# Patient Record
Sex: Male | Born: 1960 | Race: White | Hispanic: No | Marital: Married | Smoking: Former smoker
Health system: Southern US, Community
[De-identification: ages and names within clinical notes are randomized; demographics above are authoritative.]

## PROBLEM LIST (undated history)

## (undated) DIAGNOSIS — Z95810 Presence of automatic (implantable) cardiac defibrillator: Secondary | ICD-10-CM

## (undated) DIAGNOSIS — E785 Hyperlipidemia, unspecified: Secondary | ICD-10-CM

## (undated) DIAGNOSIS — I10 Essential (primary) hypertension: Secondary | ICD-10-CM

## (undated) DIAGNOSIS — E119 Type 2 diabetes mellitus without complications: Secondary | ICD-10-CM

## (undated) DIAGNOSIS — M1990 Unspecified osteoarthritis, unspecified site: Secondary | ICD-10-CM

## (undated) HISTORY — DX: Hyperlipidemia, unspecified: E78.5

## (undated) HISTORY — PX: TONSILLECTOMY: SUR1361

## (undated) HISTORY — DX: Essential (primary) hypertension: I10

## (undated) HISTORY — DX: Type 2 diabetes mellitus without complications: E11.9

## (undated) HISTORY — DX: Unspecified osteoarthritis, unspecified site: M19.90

---

## 2002-07-07 ENCOUNTER — Encounter: Admission: RE | Admit: 2002-07-07 | Discharge: 2002-10-05 | Payer: Self-pay | Admitting: Emergency Medicine

## 2017-09-12 ENCOUNTER — Other Ambulatory Visit: Payer: Self-pay

## 2017-09-12 ENCOUNTER — Ambulatory Visit: Payer: Managed Care, Other (non HMO) | Admitting: Family Medicine

## 2017-09-12 ENCOUNTER — Encounter: Payer: Self-pay | Admitting: Family Medicine

## 2017-09-12 VITALS — BP 112/78 | HR 108 | Temp 98.4°F | Ht 70.0 in | Wt 172.4 lb

## 2017-09-12 DIAGNOSIS — E7800 Pure hypercholesterolemia, unspecified: Secondary | ICD-10-CM | POA: Diagnosis not present

## 2017-09-12 DIAGNOSIS — Z794 Long term (current) use of insulin: Secondary | ICD-10-CM | POA: Diagnosis not present

## 2017-09-12 DIAGNOSIS — E1139 Type 2 diabetes mellitus with other diabetic ophthalmic complication: Secondary | ICD-10-CM

## 2017-09-12 DIAGNOSIS — Z0000 Encounter for general adult medical examination without abnormal findings: Secondary | ICD-10-CM | POA: Insufficient documentation

## 2017-09-12 DIAGNOSIS — Z1159 Encounter for screening for other viral diseases: Secondary | ICD-10-CM | POA: Insufficient documentation

## 2017-09-12 DIAGNOSIS — Z0001 Encounter for general adult medical examination with abnormal findings: Secondary | ICD-10-CM | POA: Diagnosis not present

## 2017-09-12 DIAGNOSIS — Z125 Encounter for screening for malignant neoplasm of prostate: Secondary | ICD-10-CM | POA: Insufficient documentation

## 2017-09-12 DIAGNOSIS — Z1211 Encounter for screening for malignant neoplasm of colon: Secondary | ICD-10-CM | POA: Insufficient documentation

## 2017-09-12 DIAGNOSIS — I10 Essential (primary) hypertension: Secondary | ICD-10-CM

## 2017-09-12 DIAGNOSIS — E119 Type 2 diabetes mellitus without complications: Secondary | ICD-10-CM | POA: Insufficient documentation

## 2017-09-12 NOTE — Progress Notes (Signed)
Subjective:  Patient ID: John [name], male    DOB: 24-Jan-1960  Age: 57 y.o. MRN: [id number]  CC: Establish Care (est care, needs a primary due to new insurance. 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  Blood pressure has been controlled.  Recently diagnosed noticed with eye problems associated with diabetes in the last 2 weeks he tells me.  He has been seeing the endocrinologist for his diabetes in the past but has not seen her in some time now.  He takes 55 units of glargine in the evening.  He gives himself 6 units of regular insulin before each meal.  He does enjoy 3 or so alcoholic beverages on a daily basis.  Recently switched from hard cider to Michelob ultra light.  Tells me that his fasting sugars have been in the 170+ range.  He has not been adjusting his Lantus.  He is taking simvastatin for his cholesterol.  He works third shift.  He lives with his wife.  He quit smoking some years ago.  He is up perhaps twice during his sleeping hours to urinate.  He has 2 sisters who have been recently diagnosed with colon polyps.  History Jontay has no past medical history on file.   He has no past surgical history on file.   His family history is not on file.He reports that he has quit smoking. He has never used smokeless tobacco. His alcohol and drug histories are not on file.  Outpatient Medications Prior to Visit  Medication Sig Dispense Refill  . FARXIGA 10 MG TABS tablet     . Insulin Glargine (BASAGLAR KWIKPEN) 100 UNIT/ML SOPN     . losartan-hydrochlorothiazide (HYZAAR) 100-25 MG tablet     . metFORMIN (GLUCOPHAGE-XR) 500 MG 24 hr tablet     . simvastatin (ZOCOR) 40 MG tablet      No facility-administered medications prior to visit.     ROS Review of Systems  Constitutional: Negative for chills, diaphoresis, fatigue, fever and  unexpected weight change.  HENT: Negative.   Eyes: Negative for photophobia and visual disturbance.  Respiratory: Negative.   Cardiovascular: Negative.   Gastrointestinal: Negative.   Endocrine: Negative for polyphagia and polyuria.  Genitourinary: Negative for decreased urine volume, difficulty urinating and urgency.  Skin: Negative for pallor and rash.  Allergic/Immunologic: Negative for immunocompromised state.  Neurological: Negative for weakness and headaches.  Hematological: Does not bruise/bleed easily.  Psychiatric/Behavioral: Negative.     Objective:  BP 112/78 (BP Location: Left Arm, Patient Position: Sitting, Cuff Size: Normal)   Pulse (!) 108   Temp 98.4 F (36.9 C) (Oral)   Ht 5\' 10"  (1.778 m)   Wt 172 lb 6.4 oz (78.2 kg)   SpO2 97%   BMI 24.74 kg/m   Physical Exam  Constitutional: He is oriented to person, place, and time. He appears well-developed and well-nourished. No distress.  HENT:  Head: Normocephalic and atraumatic.  Right Ear: External ear normal.  Left Ear: External ear normal.  Mouth/Throat: Oropharynx is clear and moist. No oropharyngeal exudate.  Eyes: Pupils are equal, round, and reactive to light. Conjunctivae and EOM are normal. Right eye exhibits no discharge. Left eye exhibits no discharge. No scleral icterus.  Neck: Neck supple. No JVD present. No tracheal deviation present. No thyromegaly present.  Cardiovascular: Normal rate, regular rhythm and normal heart sounds.  Pulmonary/Chest: Effort normal and breath sounds normal.  Abdominal: Soft. Bowel sounds are normal. He exhibits no distension and no mass. There is no tenderness. There is no guarding.  Genitourinary: Rectal exam shows no external hemorrhoid, no internal hemorrhoid, no fissure, no mass, no tenderness, anal tone normal and guaiac negative stool. Prostate is not enlarged and not tender.  Lymphadenopathy:    He has no cervical adenopathy.  Neurological: He is alert and oriented to  person, place, and time.  Skin: Skin is warm and dry. He is not diaphoretic.  Psychiatric: He has a normal mood and affect. His behavior is normal.      Assessment & Plan:   John [name] was seen today for establish care.  Diagnoses and all orders for this visit:  Essential hypertension -     CBC; Future -     Comprehensive metabolic panel; Future -     TSH; Future -     Urinalysis, Routine w reflex microscopic; Future -     Microalbumin / creatinine urine ratio; Future  Elevated LDL cholesterol level -     Lipid panel; Future  Type 2 diabetes mellitus with other ophthalmic complication, with long-term current use of insulin (HCC) -     CBC; Future -     Comprehensive metabolic panel; Future -     Hemoglobin A1c; Future -     TSH; Future -     Urinalysis, Routine w reflex microscopic; Future -     Microalbumin / creatinine urine ratio; Future  Encounter for health maintenance examination with abnormal findings -     PSA; Future -     Ambulatory referral to Gastroenterology   I am having Hinda Lenis. Gordy Savers [name] maintain his Cruzita Lederer [facility], losartan-hydrochlorothiazide, metFORMIN, and simvastatin.  No orders of the defined types were placed in this encounter.  As patient to cut back on his ultralights to no more than 2 a day.  He will increase his Basaglar by 4 units nightly to achieve fasting blood sugars in the less than 140 range.  May be referred back to Dr. Soyla Murphy pending the results of his ordered hemoglobin A1c.  He did tell me that Zocor drives him crazy but he has been taking simvastatin without issue.  Asked him to follow-up with me in a month.  He was given anticipatory guidance on disease prevention and health promotion.  Colonoscopy ordered.  Follow-up: Return in about 1 month (around 10/12/2017).  Libby Maw, MD

## 2017-09-12 NOTE — Patient Instructions (Signed)
Diabetes Mellitus and Nutrition When you have diabetes (diabetes mellitus), it is very important to have healthy eating habits because your blood sugar (glucose) levels are greatly affected by what you eat and drink. Eating healthy foods in the appropriate amounts, at about the same times every day, can help you:  Control your blood glucose.  Lower your risk of heart disease.  Improve your blood pressure.  Reach or maintain a healthy weight.  Every person with diabetes is different, and each person has different needs for a meal plan. Your health care provider may recommend that you work with a diet and nutrition specialist (dietitian) to make a meal plan that is best for you. Your meal plan may vary depending on factors such as:  The calories you need.  The medicines you take.  Your weight.  Your blood glucose, blood pressure, and cholesterol levels.  Your activity level.  Other health conditions you have, such as heart or kidney disease.  How do carbohydrates affect me? Carbohydrates affect your blood glucose level more than any other type of food. Eating carbohydrates naturally increases the amount of glucose in your blood. Carbohydrate counting is a method for keeping track of how many carbohydrates you eat. Counting carbohydrates is important to keep your blood glucose at a healthy level, especially if you use insulin or take certain oral diabetes medicines. It is important to know how many carbohydrates you can safely have in each meal. This is different for every person. Your dietitian can help you calculate how many carbohydrates you should have at each meal and for snack. Foods that contain carbohydrates include:  Bread, cereal, rice, pasta, and crackers.  Potatoes and corn.  Peas, beans, and lentils.  Milk and yogurt.  Fruit and juice.  Desserts, such as cakes, cookies, ice cream, and candy.  How does alcohol affect me? Alcohol can cause a sudden decrease in blood  glucose (hypoglycemia), especially if you use insulin or take certain oral diabetes medicines. Hypoglycemia can be a life-threatening condition. Symptoms of hypoglycemia (sleepiness, dizziness, and confusion) are similar to symptoms of having too much alcohol. If your health care provider says that alcohol is safe for you, follow these guidelines:  Limit alcohol intake to no more than 1 drink per day for nonpregnant women and 2 drinks per day for men. One drink equals 12 oz of beer, 5 oz of wine, or 1 oz of hard liquor.  Do not drink on an empty stomach.  Keep yourself hydrated with water, diet soda, or unsweetened iced tea.  Keep in mind that regular soda, juice, and other mixers may contain a lot of sugar and must be counted as carbohydrates.  What are tips for following this plan? Reading food labels  Start by checking the serving size on the label. The amount of calories, carbohydrates, fats, and other nutrients listed on the label are based on one serving of the food. Many foods contain more than one serving per package.  Check the total grams (g) of carbohydrates in one serving. You can calculate the number of servings of carbohydrates in one serving by dividing the total carbohydrates by 15. For example, if a food has 30 g of total carbohydrates, it would be equal to 2 servings of carbohydrates.  Check the number of grams (g) of saturated and trans fats in one serving. Choose foods that have low or no amount of these fats.  Check the number of milligrams (mg) of sodium in one serving. Most people   should limit total sodium intake to less than 2,300 mg per day.  Always check the nutrition information of foods labeled as "low-fat" or "nonfat". These foods may be higher in added sugar or refined carbohydrates and should be avoided.  Talk to your dietitian to identify your daily goals for nutrients listed on the label. Shopping  Avoid buying canned, premade, or processed foods. These  foods tend to be high in fat, sodium, and added sugar.  Shop around the outside edge of the grocery store. This includes fresh fruits and vegetables, bulk grains, fresh meats, and fresh dairy. Cooking  Use low-heat cooking methods, such as baking, instead of high-heat cooking methods like deep frying.  Cook using healthy oils, such as olive, canola, or sunflower oil.  Avoid cooking with butter, cream, or high-fat meats. Meal planning  Eat meals and snacks regularly, preferably at the same times every day. Avoid going long periods of time without eating.  Eat foods high in fiber, such as fresh fruits, vegetables, beans, and whole grains. Talk to your dietitian about how many servings of carbohydrates you can eat at each meal.  Eat 4-6 ounces of lean protein each day, such as lean meat, chicken, fish, eggs, or tofu. 1 ounce is equal to 1 ounce of meat, chicken, or fish, 1 egg, or 1/4 cup of tofu.  Eat some foods each day that contain healthy fats, such as avocado, nuts, seeds, and fish. Lifestyle   Check your blood glucose regularly.  Exercise at least 30 minutes 5 or more days each week, or as told by your health care provider.  Take medicines as told by your health care provider.  Do not use any products that contain nicotine or tobacco, such as cigarettes and e-cigarettes. If you need help quitting, ask your health care provider.  Work with a Social worker or diabetes educator to identify strategies to manage stress and any emotional and social challenges. What are some questions to ask my health care provider?  Do I need to meet with a diabetes educator?  Do I need to meet with a dietitian?  What number can I call if I have questions?  When are the best times to check my blood glucose? Where to find more information:  American Diabetes Association: diabetes.org/food-and-fitness/food  Academy of Nutrition and Dietetics:  PokerClues.dk  [employer] of Diabetes and Digestive and Kidney Diseases (NIH): ContactWire.be Summary  A healthy meal plan will help you control your blood glucose and maintain a healthy lifestyle.  Working with a diet and nutrition specialist (dietitian) can help you make a meal plan that is best for you.  Keep in mind that carbohydrates and alcohol have immediate effects on your blood glucose levels. It is important to count carbohydrates and to use alcohol carefully. This information is not intended to replace advice given to you by your health care provider. Make sure you discuss any questions you have with your health care provider. Document Released: 09/14/2004 Document Revised: 01/23/2016 Document Reviewed: 01/23/2016 Elsevier Interactive Patient Education  2018 [place] Maintenance, Male A healthy lifestyle and preventive care is important for your health and wellness. Ask your health care provider about what schedule of regular examinations is right for you. What should I know about weight and diet? Eat a Healthy Diet  Eat plenty of vegetables, fruits, whole grains, low-fat dairy products, and lean protein.  Do not eat a lot of foods high in solid fats, added sugars, or salt.  Maintain a  Healthy Weight Regular exercise can help you achieve or maintain a healthy weight. You should:  Do at least 150 minutes of exercise each week. The exercise should increase your heart rate and make you sweat (moderate-intensity exercise).  Do strength-training exercises at least twice a week.  Watch Your Levels of Cholesterol and Blood Lipids  Have your blood tested for lipids and cholesterol every 5 years starting at 56 years of age. If you are at high risk for heart disease, you should start having your blood tested when you are 56 years old. You  may need to have your cholesterol levels checked more often if: ? Your lipid or cholesterol levels are high. ? You are older than 56 years of age. ? You are at high risk for heart disease.  What should I know about cancer screening? Many types of cancers can be detected early and may often be prevented. Lung Cancer  You should be screened every year for lung cancer if: ? You are a current smoker who has smoked for at least 30 years. ? You are a former smoker who has quit within the past 15 years.  Talk to your health care provider about your screening options, when you should start screening, and how often you should be screened.  Colorectal Cancer  Routine colorectal cancer screening usually begins at 56 years of age and should be repeated every 5-10 years until you are 56 years old. You may need to be screened more often if early forms of precancerous polyps or small growths are found. Your health care provider may recommend screening at an earlier age if you have risk factors for colon cancer.  Your health care provider may recommend using home test kits to check for hidden blood in the stool.  A small camera at the end of a tube can be used to examine your colon (sigmoidoscopy or colonoscopy). This checks for the earliest forms of colorectal cancer.  Prostate and Testicular Cancer  Depending on your age and overall health, your health care provider may do certain tests to screen for prostate and testicular cancer.  Talk to your health care provider about any symptoms or concerns you have about testicular or prostate cancer.  Skin Cancer  Check your skin from head to toe regularly.  Tell your health care provider about any new moles or changes in moles, especially if: ? There is a change in a mole's size, shape, or color. ? You have a mole that is larger than a pencil eraser.  Always use sunscreen. Apply sunscreen liberally and repeat throughout the day.  Protect yourself by  wearing long sleeves, pants, a wide-brimmed hat, and sunglasses when outside.  What should I know about heart disease, diabetes, and high blood pressure?  If you are 59-23 years of age, have your blood pressure checked every 3-5 years. If you are 61 years of age or older, have your blood pressure checked every year. You should have your blood pressure measured twice-once when you are at a hospital or clinic, and once when you are not at a hospital or clinic. Record the average of the two measurements. To check your blood pressure when you are not at a hospital or clinic, you can use: ? An automated blood pressure machine at a pharmacy. ? A home blood pressure monitor.  Talk to your health care provider about your target blood pressure.  If you are between 57-36 years old, ask your health care provider if you should  take aspirin to prevent heart disease.  Have regular diabetes screenings by checking your fasting blood sugar level. ? If you are at a normal weight and have a low risk for diabetes, have this test once every three years after the age of 45. ? If you are overweight and have a high risk for diabetes, consider being tested at a younger age or more often.  A one-time screening for abdominal aortic aneurysm (AAA) by ultrasound is recommended for men aged 77-75 years who are current or former smokers. What should I know about preventing infection? Hepatitis B If you have a higher risk for hepatitis B, you should be screened for this virus. Talk with your health care provider to find out if you are at risk for hepatitis B infection. Hepatitis C Blood testing is recommended for:  Everyone born from [birth year] through 1964.  Anyone with known risk factors for hepatitis C.  Sexually Transmitted Diseases (STDs)  You should be screened each year for STDs including gonorrhea and chlamydia if: ? You are sexually active and are younger than 56 years of age. ? You are older than 56 years of age  and your health care provider tells you that you are at risk for this type of infection. ? Your sexual activity has changed since you were last screened and you are at an increased risk for chlamydia or gonorrhea. Ask your health care provider if you are at risk.  Talk with your health care provider about whether you are at high risk of being infected with HIV. Your health care provider may recommend a prescription medicine to help prevent HIV infection.  What else can I do?  Schedule regular health, dental, and eye exams.  Stay current with your vaccines (immunizations).  Do not use any tobacco products, such as cigarettes, chewing tobacco, and e-cigarettes. If you need help quitting, ask your health care provider.  Limit alcohol intake to no more than 2 drinks per day. One drink equals 12 ounces of beer, 5 ounces of wine, or 1 ounces of hard liquor.  Do not use street drugs.  Do not share needles.  Ask your health care provider for help if you need support or information about quitting drugs.  Tell your health care provider if you often feel depressed.  Tell your health care provider if you have ever been abused or do not feel safe at home. This information is not intended to replace advice given to you by your health care provider. Make sure you discuss any questions you have with your health care provider. Document Released: 06/16/2007 Document Revised: 08/17/2015 Document Reviewed: 09/21/2014 Elsevier Interactive Patient Education  [employer].

## 2017-10-02 ENCOUNTER — Encounter: Payer: Self-pay | Admitting: Internal Medicine

## 2017-10-08 ENCOUNTER — Other Ambulatory Visit (INDEPENDENT_AMBULATORY_CARE_PROVIDER_SITE_OTHER): Payer: Managed Care, Other (non HMO)

## 2017-10-08 DIAGNOSIS — Z0001 Encounter for general adult medical examination with abnormal findings: Secondary | ICD-10-CM

## 2017-10-08 DIAGNOSIS — E7800 Pure hypercholesterolemia, unspecified: Secondary | ICD-10-CM | POA: Diagnosis not present

## 2017-10-08 DIAGNOSIS — I10 Essential (primary) hypertension: Secondary | ICD-10-CM

## 2017-10-08 DIAGNOSIS — E1139 Type 2 diabetes mellitus with other diabetic ophthalmic complication: Secondary | ICD-10-CM | POA: Diagnosis not present

## 2017-10-08 DIAGNOSIS — Z794 Long term (current) use of insulin: Secondary | ICD-10-CM | POA: Diagnosis not present

## 2017-10-08 LAB — COMPREHENSIVE METABOLIC PANEL
ALT: 21 U/L (ref 0–53)
AST: 18 U/L (ref 0–37)
Albumin: 4.6 g/dL (ref 3.5–5.2)
Alkaline Phosphatase: 55 U/L (ref 39–117)
BUN: 27 mg/dL — ABNORMAL HIGH (ref 6–23)
CO2: 29 mEq/L (ref 19–32)
Calcium: 9.8 mg/dL (ref 8.4–10.5)
Chloride: 101 mEq/L (ref 96–112)
Creatinine, Ser: 0.9 mg/dL (ref 0.40–1.50)
GFR: 92.65 mL/min (ref >60.00)
Glucose, Bld: 93 mg/dL (ref 70–99)
Potassium: 4.2 mEq/L (ref 3.5–5.1)
Sodium: 139 mEq/L (ref 135–145)
Total Bilirubin: 0.5 mg/dL (ref 0.2–1.2)
Total Protein: 6.9 g/dL (ref 6.0–8.3)

## 2017-10-08 LAB — CBC
HCT: 46.1 % (ref 39.0–52.0)
Hemoglobin: 16 g/dL (ref 13.0–17.0)
MCHC: 34.7 g/dL (ref 30.0–36.0)
MCV: 93.9 fl (ref 78.0–100.0)
Platelets: 352 10*3/uL (ref 150.0–400.0)
RBC: 4.91 Mil/uL (ref 4.22–5.81)
RDW: 12.6 % (ref 11.5–15.5)
WBC: 10.6 10*3/uL — ABNORMAL HIGH (ref 4.0–10.5)

## 2017-10-08 LAB — URINALYSIS, ROUTINE W REFLEX MICROSCOPIC
Bilirubin Urine: NEGATIVE
Hgb urine dipstick: NEGATIVE
Ketones, ur: NEGATIVE
Leukocytes, UA: NEGATIVE
Nitrite: NEGATIVE
RBC / HPF: NONE SEEN (ref 0–?)
Specific Gravity, Urine: 1.025 (ref 1.000–1.030)
Total Protein, Urine: NEGATIVE
Urine Glucose: >=1000 — AB
Urobilinogen, UA: 0.2 (ref 0.0–1.0)
WBC, UA: NONE SEEN (ref 0–?)
pH: 5.5 (ref 5.0–8.0)

## 2017-10-08 LAB — LIPID PANEL
Cholesterol: 168 mg/dL (ref 0–200)
HDL: 51.8 mg/dL (ref >39.00)
LDL Cholesterol: 101 mg/dL — ABNORMAL HIGH (ref 0–99)
NonHDL: 115.96
Total CHOL/HDL Ratio: 3
Triglycerides: 75 mg/dL (ref 0.0–149.0)
VLDL: 15 mg/dL (ref 0.0–40.0)

## 2017-10-08 LAB — MICROALBUMIN / CREATININE URINE RATIO
Creatinine,U: 116.8 mg/dL
Microalb Creat Ratio: 0.6 mg/g (ref 0.0–30.0)
Microalb, Ur: <0.7 mg/dL (ref 0.0–1.9)

## 2017-10-08 LAB — TSH: TSH: 1.54 u[IU]/mL (ref 0.35–4.50)

## 2017-10-08 LAB — HEMOGLOBIN A1C: Hgb A1c MFr Bld: 7.2 % — ABNORMAL HIGH (ref 4.6–6.5)

## 2017-10-08 LAB — PSA: PSA: 0.65 ng/mL (ref 0.10–4.00)

## 2017-10-10 ENCOUNTER — Ambulatory Visit: Payer: Managed Care, Other (non HMO) | Admitting: Family Medicine

## 2017-10-10 ENCOUNTER — Encounter: Payer: Self-pay | Admitting: Family Medicine

## 2017-10-10 VITALS — BP 110/60 | HR 85 | Ht 70.0 in | Wt 172.0 lb

## 2017-10-10 DIAGNOSIS — Z23 Encounter for immunization: Secondary | ICD-10-CM | POA: Diagnosis not present

## 2017-10-10 DIAGNOSIS — Z794 Long term (current) use of insulin: Secondary | ICD-10-CM

## 2017-10-10 DIAGNOSIS — I10 Essential (primary) hypertension: Secondary | ICD-10-CM

## 2017-10-10 DIAGNOSIS — E1139 Type 2 diabetes mellitus with other diabetic ophthalmic complication: Secondary | ICD-10-CM

## 2017-10-10 DIAGNOSIS — E7800 Pure hypercholesterolemia, unspecified: Secondary | ICD-10-CM | POA: Diagnosis not present

## 2017-10-10 NOTE — Progress Notes (Signed)
Subjective:  Patient ID: John [name], male    DOB: July 14, 1960  Age: 57 y.o. MRN: [id number]  CC: Follow-up   HPI John [name] presents for follow-up of his blood work.  He does work nights.  He does take his Basaglar before going to work.  His fasting sugars are taken after work the following morning.  He does sometimes eat at work.  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  He is taking 5 units of regular insulin prior to each meal.  He is interested in seeing an endocrinologist as well.  He is drinking no more than 2-minute Michelob ultra light daily.  His alcohol consumption has been heavier in the past.  His wife has been concerned about his drinking more so in the past than recently.  He has been taking BC powders on a daily basis for muscle aches in his legs.  Outpatient Medications Prior to Visit  Medication Sig Dispense Refill  . FARXIGA 10 MG TABS tablet     . Insulin Glargine (BASAGLAR KWIKPEN) 100 UNIT/ML SOPN     . losartan-hydrochlorothiazide (HYZAAR) 100-25 MG tablet     . metFORMIN (GLUCOPHAGE-XR) 500 MG 24 hr tablet     . simvastatin (ZOCOR) 40 MG tablet      No facility-administered medications prior to visit.     ROS Review of Systems  Constitutional: Negative.   HENT: Negative.   Respiratory: Negative.   Cardiovascular: Negative.   Gastrointestinal: Negative.   Endocrine: Negative for polyphagia and polyuria.  Musculoskeletal: Positive for myalgias. Negative for gait problem and joint swelling.  Hematological: Does not bruise/bleed easily.  Psychiatric/Behavioral: Negative.     Objective:  BP 110/60   Pulse 85   Ht 5\' 10"  (1.778 m)   Wt 172 lb (78 kg)   SpO2 99%   BMI 24.68 kg/m   BP Readings from Last 3 Encounters:  10/10/17 110/60  09/12/17 112/78    Wt Readings from Last 3 Encounters:  10/10/17 172 lb (78 kg)  09/12/17 172 lb 6.4 oz (78.2 kg)    Physical Exam  Constitutional: He is oriented to person, place, and time. He appears  well-developed and well-nourished. No distress.  HENT:  Head: Normocephalic and atraumatic.  Right Ear: External ear normal.  Eyes: Right eye exhibits no discharge. Left eye exhibits no discharge.  Neck: No JVD present. No tracheal deviation present.  Pulmonary/Chest: Effort normal.  Neurological: He is alert and oriented to person, place, and time.  Skin: He is not diaphoretic.  Psychiatric: He has a normal mood and affect. His behavior is normal.    Lab Results  Component Value Date   WBC 10.6 (H) 10/08/2017   HGB 16.0 10/08/2017   HCT 46.1 10/08/2017   PLT 352.0 10/08/2017   GLUCOSE 93 10/08/2017   CHOL 168 10/08/2017   TRIG 75.0 10/08/2017   HDL 51.80 10/08/2017   LDLCALC 101 (H) 10/08/2017   ALT 21 10/08/2017   AST 18 10/08/2017   NA 139 10/08/2017   K 4.2 10/08/2017   CL 101 10/08/2017   CREATININE 0.90 10/08/2017   BUN 27 (H) 10/08/2017   CO2 29 10/08/2017   TSH 1.54 10/08/2017   PSA 0.65 10/08/2017   HGBA1C 7.2 (H) 10/08/2017   MICROALBUR <0.7 10/08/2017    No results found.  Assessment & Plan:   John [name] was seen today for follow-up.  Diagnoses and all orders for this visit:  Essential hypertension  Type 2  diabetes mellitus with other ophthalmic complication, with long-term current use of insulin ([place]) -     Ambulatory referral to Endocrinology  Elevated LDL cholesterol level  Need for influenza vaccination -     Flu Vaccine QUAD 36+ mos IM   I am having Hinda Lenis. John [name] maintain his Cruzita Lederer [facility], losartan-hydrochlorothiazide, metFORMIN, and simvastatin.  No orders of the defined types were placed in this encounter.  Patient's diabetes is actually under good control.  He does have apt ophthalmological complications.  He takes preprandial insulin.  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  He request endocrinology referral for oversight and I agree.  He will eventually phase out the alcohol.  He is currently having no more than 1 or 2  Michelob ultralights daily.  He will switch from daily BC powders to Tylenol for his muscle aches and pains.  Will refill his medicines his medicines.  Follow-up in 6 months  Follow-up: Return in about 6 months (around 04/11/2018).  Libby Maw, MD

## 2017-10-31 ENCOUNTER — Encounter: Payer: Self-pay | Admitting: Internal Medicine

## 2017-10-31 ENCOUNTER — Ambulatory Visit (AMBULATORY_SURGERY_CENTER): Payer: Self-pay

## 2017-10-31 VITALS — Ht 66.0 in | Wt 170.2 lb

## 2017-10-31 DIAGNOSIS — Z1211 Encounter for screening for malignant neoplasm of colon: Secondary | ICD-10-CM

## 2017-10-31 MED ORDER — NA SULFATE-K SULFATE-MG SULF 17.5-3.13-1.6 GM/177ML PO SOLN: 1.0000 | Freq: Once | ORAL | 0 refills | Status: AC

## 2017-10-31 NOTE — Progress Notes (Signed)
Denies allergies to eggs or soy products. Denies complication of anesthesia or sedation. Denies use of weight loss medication. Denies use of O2.   Emmi instructions declined.  

## 2017-11-08 ENCOUNTER — Telehealth: Payer: Self-pay | Admitting: Family Medicine

## 2017-11-08 MED ORDER — BASAGLAR KWIKPEN 100 UNIT/ML ~~LOC~~ SOPN: 55.0000 [IU] | PEN_INJECTOR | Freq: Every day | SUBCUTANEOUS | 2 refills | Status: DC

## 2017-11-08 MED ORDER — FARXIGA 10 MG PO TABS: 10.0000 mg | ORAL_TABLET | Freq: Every day | ORAL | 2 refills | Status: DC

## 2017-11-08 NOTE — Telephone Encounter (Signed)
Patient came in stating that he needs some RX refilled. He was told by Dr. Ethelene Hal that he could get these rx refill until the patient goes to see his Endo Doctor on 12/11/17 at 8:30am. The Rx are Basaglar 100unit/ml Claiborne Rigg and Farxiga 10mg . Please advise. [facility] on [address] Please call patient at [phone].

## 2017-11-08 NOTE — Telephone Encounter (Signed)
Rx's have been sent in. I tried contacting patient, but unable to leave him a voicemail.

## 2017-11-14 ENCOUNTER — Encounter: Payer: Self-pay | Admitting: Internal Medicine

## 2017-11-14 ENCOUNTER — Ambulatory Visit (AMBULATORY_SURGERY_CENTER): Payer: Managed Care, Other (non HMO) | Admitting: Internal Medicine

## 2017-11-14 VITALS — BP 115/86 | HR 84 | Temp 97.5°F | Resp 12 | Ht 66.0 in | Wt 170.0 lb

## 2017-11-14 DIAGNOSIS — D128 Benign neoplasm of rectum: Secondary | ICD-10-CM

## 2017-11-14 DIAGNOSIS — K621 Rectal polyp: Secondary | ICD-10-CM

## 2017-11-14 DIAGNOSIS — Z1211 Encounter for screening for malignant neoplasm of colon: Secondary | ICD-10-CM | POA: Diagnosis not present

## 2017-11-14 DIAGNOSIS — K635 Polyp of colon: Secondary | ICD-10-CM | POA: Diagnosis not present

## 2017-11-14 DIAGNOSIS — D122 Benign neoplasm of ascending colon: Secondary | ICD-10-CM

## 2017-11-14 DIAGNOSIS — D129 Benign neoplasm of anus and anal canal: Secondary | ICD-10-CM

## 2017-11-14 MED ORDER — SODIUM CHLORIDE 0.9 % IV SOLN: 500.0000 mL | Freq: Once | INTRAVENOUS | Status: DC

## 2017-11-14 NOTE — Op Note (Signed)
[name] Procedure Date: 11/14/2017 9:21 AM MRN: [id number] Endoscopist: Docia Chuck. Henrene Pastor , MD Age: 57 Referring MD:  Date of Birth: 06/25/1960 Gender: Male Account #: [id number] Procedure:                Colonoscopy with cold snare polypectomy x 2 Indications:              Screening for colorectal malignant neoplasm Medicines:                Monitored Anesthesia Care Procedure:                Pre-Anesthesia Assessment:                           - Prior to the procedure, a History and Physical                            was performed, and patient medications and                            allergies were reviewed. The patient's tolerance of                            previous anesthesia was also reviewed. The risks                            and benefits of the procedure and the sedation                            options and risks were discussed with the patient.                            All questions were answered, and informed consent                            was obtained. Prior Anticoagulants: The patient has                            taken no previous anticoagulant or antiplatelet                            agents. ASA Grade Assessment: II - A patient with                            mild systemic disease. After reviewing the risks                            and benefits, the patient was deemed in                            satisfactory condition to undergo the procedure.                           After obtaining informed consent, the colonoscope  was passed under direct vision. Throughout the                            procedure, the patient's blood pressure, pulse, and                            oxygen saturations were monitored continuously. The                            Colonoscope was introduced through the anus and                            advanced to the the cecum, identified by     appendiceal orifice and ileocecal valve. The                            ileocecal valve, appendiceal orifice, and rectum                            were photographed. The quality of the bowel                            preparation was excellent. The colonoscopy was                            performed without difficulty. The patient tolerated                            the procedure well. The bowel preparation used was                            SUPREP. Scope In: 9:43:06 AM Scope Out: 10:00:37 AM Scope Withdrawal Time: 0 hours 12 minutes 38 seconds  Total Procedure Duration: 0 hours 17 minutes 31 seconds  Findings:                 Two polyps were found in the rectum and ascending                            colon. The polyps were 5 to 6 mm in size. The                            polyps were removed with cold snare and submitted                            for pathologic analysis.                           The entire examined colon appeared normal on direct                            and retroflexion views. Complications:            No immediate complications. Estimated blood loss:  None. Estimated Blood Loss:     Estimated blood loss: none. Estimated blood loss:                            none. Impression:               - Two 5 to 6 mm polyps in the rectum and in the                            ascending colon, status post polypectomy.                           - The entire examined colon is normal on direct and                            retroflexion views. Recommendation:           - Repeat colonoscopy in 5 years for surveillance.                           - Patient has a contact number available for                            emergencies. The signs and symptoms of potential                            delayed complications were discussed with the                            patient. Return to normal activities tomorrow.                            Written  discharge instructions were provided to the                            patient.                           - Resume previous diet.                           - Continue present medications.                           - Repeat colonoscopy in 5 years for surveillance. Docia Chuck. Henrene Pastor, MD 11/14/2017 10:08:59 AM This report has been signed electronically.

## 2017-11-14 NOTE — Progress Notes (Signed)
Pt's states no medical or surgical changes since previsit or office visit. 

## 2017-11-14 NOTE — Patient Instructions (Signed)
YOU HAD AN ENDOSCOPIC PROCEDURE TODAY AT THE [facility]:   Refer to the procedure report that was given to you for any specific questions about what was found during the examination.  If the procedure report does not answer your questions, please call your gastroenterologist to clarify.  If you requested that your care partner not be given the details of your procedure findings, then the procedure report has been included in a sealed envelope for you to review at your convenience later.  YOU SHOULD EXPECT: Some feelings of bloating in the abdomen. Passage of more gas than usual.  Walking can help get rid of the air that was put into your GI tract during the procedure and reduce the bloating. If you had a lower endoscopy (such as a colonoscopy or flexible sigmoidoscopy) you may notice spotting of blood in your stool or on the toilet paper. If you underwent a bowel prep for your procedure, you may not have a normal bowel movement for a few days.  Please Note:  You might notice some irritation and congestion in your nose or some drainage.  This is from the oxygen used during your procedure.  There is no need for concern and it should clear up in a day or so.  SYMPTOMS TO REPORT IMMEDIATELY:   Following lower endoscopy (colonoscopy or flexible sigmoidoscopy):  Excessive amounts of blood in the stool  Significant tenderness or worsening of abdominal pains  Swelling of the abdomen that is new, acute  Fever of 100F or higher  For urgent or emergent issues, a gastroenterologist can be reached at any hour by calling [phone].   DIET:  We do recommend a small meal at first, but then you may proceed to your regular diet.  Drink plenty of fluids but you should avoid alcoholic beverages for 24 hours.  MEDICATIONS: Continue present medications.  Please see handouts given to you by your recovery nurse.  ACTIVITY:  You should plan to take it easy for the rest of today and you should NOT  DRIVE or use heavy machinery until tomorrow (because of the sedation medicines used during the test).    FOLLOW UP: Our staff will call the number listed on your records the next business day following your procedure to check on you and address any questions or concerns that you may have regarding the information given to you following your procedure. If we do not reach you, we will leave a message.  However, if you are feeling well and you are not experiencing any problems, there is no need to return our call.  We will assume that you have returned to your regular daily activities without incident.  If any biopsies were taken you will be contacted by phone or by letter within the next 1-3 weeks.  Please call us at [phone] if you have not heard about the biopsies in 3 weeks.   Thank you for allowing us to provide for your healthcare needs today.   SIGNATURES/CONFIDENTIALITY: You and/or your care partner have signed paperwork which will be entered into your electronic medical record.  These signatures attest to the fact that that the information above on your After Visit Summary has been reviewed and is understood.  Full responsibility of the confidentiality of this discharge information lies with you and/or your care-partner. 

## 2017-11-14 NOTE — Progress Notes (Signed)
Called to room to assist during endoscopic procedure.  Patient ID and intended procedure confirmed with present staff. Received instructions for my participation in the procedure from the performing physician.  

## 2017-11-14 NOTE — Progress Notes (Signed)
PT taken to PACU. Monitors in place. VSS. Report given to RN. 

## 2017-11-15 ENCOUNTER — Telehealth: Payer: Self-pay | Admitting: *Deleted

## 2017-11-15 NOTE — Telephone Encounter (Signed)
  Follow up Call-  Call back number 11/14/2017  Post procedure Call Back phone  # [phone]  Permission to leave phone message Yes  Some recent data might be hidden     Patient questions:  Do you have a fever, pain , or abdominal swelling? No. Pain Score  0 *  Have you tolerated food without any problems? Yes.    Have you been able to return to your normal activities? Yes.    Do you have any questions about your discharge instructions: Diet   No. Medications  No. Follow up visit  No.  Do you have questions or concerns about your Care? No.  Actions: * If pain score is 4 or above: No action needed, pain <4.

## 2017-11-20 ENCOUNTER — Encounter: Payer: Self-pay | Admitting: Internal Medicine

## 2017-12-11 ENCOUNTER — Ambulatory Visit: Payer: Managed Care, Other (non HMO) | Admitting: Endocrinology

## 2017-12-11 ENCOUNTER — Telehealth: Payer: Self-pay | Admitting: Endocrinology

## 2017-12-11 ENCOUNTER — Other Ambulatory Visit: Payer: Self-pay

## 2017-12-11 ENCOUNTER — Encounter: Payer: Self-pay | Admitting: Endocrinology

## 2017-12-11 VITALS — BP 110/82 | HR 91 | Ht 66.0 in | Wt 167.2 lb

## 2017-12-11 DIAGNOSIS — Z794 Long term (current) use of insulin: Secondary | ICD-10-CM | POA: Diagnosis not present

## 2017-12-11 DIAGNOSIS — E1139 Type 2 diabetes mellitus with other diabetic ophthalmic complication: Secondary | ICD-10-CM

## 2017-12-11 LAB — POCT GLYCOSYLATED HEMOGLOBIN (HGB A1C): Hemoglobin A1C: 7.5 % — AB (ref 4.0–5.6)

## 2017-12-11 MED ORDER — PEN NEEDLES 32G X 4 MM MISC: 1.0000 | Freq: Four times a day (QID) | 2 refills | Status: DC

## 2017-12-11 MED ORDER — INSULIN LISPRO (1 UNIT DIAL) 100 UNIT/ML (KWIKPEN): 5.0000 [IU] | PEN_INJECTOR | Freq: Three times a day (TID) | SUBCUTANEOUS | 11 refills | Status: DC

## 2017-12-11 MED ORDER — INSULIN DEGLUDEC 100 UNIT/ML ~~LOC~~ SOPN: 45.0000 [IU] | PEN_INJECTOR | Freq: Every day | SUBCUTANEOUS | 11 refills | Status: DC

## 2017-12-11 MED ORDER — SILDENAFIL CITRATE 100 MG PO TABS: 100.0000 mg | ORAL_TABLET | Freq: Every day | ORAL | 2 refills | Status: DC | PRN

## 2017-12-11 NOTE — Progress Notes (Signed)
Subjective:    Patient ID: John [name], male    DOB: Mar 04, 1960, 57 y.o.   MRN: [id number]  HPI pt is referred by Dr Ethelene Hal, for diabetes.  Pt states DM was dx'ed in 2004; he has mild if any neuropathy of the lower extremities, but he has associated DR; he has been on insulin since 2012; pt says his diet and exercise are good; he has never had pancreatitis, pancreatic surgery, severe hypoglycemia or DKA.  He takes basaglar 55/d, humalog 5 units 3 times a day (just before each meal), metformin, and farxiga. He works 3rd shift, in grocery distribution.  He does not want frequent f/u here, due to high copay.  He seldom checks cbg.  When he does, it is in the mid to high-100's.  He says cbg is higher after meals than before.   Past Medical History:  Diagnosis Date  . Arthritis   . Diabetes mellitus without complication ([place])   . Hyperlipidemia   . Hypertension     Past Surgical History:  Procedure Laterality Date  . TONSILLECTOMY      Social History   Socioeconomic History  . Marital status: Married    Spouse name: Not on file  . Number of children: Not on file  . Years of education: Not on file  . Highest education level: Not on file  Occupational History  . Not on file  Social Needs  . Financial resource strain: Not on file  . Food insecurity:    Worry: Not on file    Inability: Not on file  . Transportation needs:    Medical: Not on file    Non-medical: Not on file  Tobacco Use  . Smoking status: Former Research scientist (life sciences)  . Smokeless tobacco: Never Used  . Tobacco comment: Quit in 2017   Substance and Sexual Activity  . Alcohol use: Yes    Comment: Socially  . Drug use: Never  . Sexual activity: Not on file  Lifestyle  . Physical activity:    Days per week: Not on file    Minutes per session: Not on file  . Stress: Not on file  Relationships  . Social connections:    Talks on phone: Not on file    Gets together: Not on file    Attends religious service: Not on file     Active member of club or organization: Not on file    Attends meetings of clubs or organizations: Not on file    Relationship status: Not on file  . Intimate partner violence:    Fear of current or ex partner: Not on file    Emotionally abused: Not on file    Physically abused: Not on file    Forced sexual activity: Not on file  Other Topics Concern  . Not on file  Social History Narrative  . Not on file    Current Outpatient Medications on File Prior to Visit  Medication Sig Dispense Refill  . FARXIGA 10 MG TABS tablet Take 10 mg by mouth daily. 30 tablet 2  . insulin lispro (HUMALOG) 100 UNIT/ML injection Inject 5 Units into the skin 3 (three) times daily before meals.    Marland Kitchen losartan-hydrochlorothiazide (HYZAAR) 100-25 MG tablet     . metFORMIN (GLUCOPHAGE-XR) 500 MG 24 hr tablet     . OVER THE COUNTER MEDICATION Goody's powder two times daily.    . simvastatin (ZOCOR) 40 MG tablet      Current Facility-Administered Medications on  File Prior to Visit  Medication Dose Route Frequency Provider Last Rate Last Dose  . 0.9 %  sodium chloride infusion  500 mL Intravenous Once Irene Shipper, MD        No Known Allergies  Family History  Problem Relation Age of Onset  . Diabetes Mother   . Colon cancer Neg Hx   . Esophageal cancer Neg Hx   . Rectal cancer Neg Hx   . Stomach cancer Neg Hx     BP 110/82 (BP Location: Right Arm, Patient Position: Sitting, Cuff Size: Normal)   Pulse 91   Ht 5\' 6"  (1.676 m)   Wt 167 lb 3.2 oz (75.8 kg)   SpO2 98%   BMI 26.99 kg/m   Review of Systems denies blurry vision, headache, chest pain, sob, n/v, urinary frequency, muscle cramps, excessive diaphoresis, memory loss, depression, cold intolerance, rhinorrhea, hypoglycemia, and easy bruising.  He has lost 50 lbs x 3 years.       Objective:   Physical Exam VS: see vs page GEN: no distress HEAD: head: no deformity eyes: no periorbital swelling, no proptosis external nose and ears are  normal mouth: no lesion seen NECK: supple, thyroid is not enlarged CHEST WALL: no deformity LUNGS: clear to auscultation CV: reg rate and rhythm, no murmur ABD: abdomen is soft, nontender.  no hepatosplenomegaly.  not distended.  no hernia MUSCULOSKELETAL: muscle bulk and strength are grossly normal.  no obvious joint swelling.  gait is normal and steady EXTEMITIES: no deformity.  no ulcer on the feet, but she skin is dry.  feet are of normal color and temp.  no leg edema.  There is bilateral onychomycosis of the toenails.   PULSES: dorsalis pedis intact bilat.  no carotid bruit NEURO:  cn 2-12 grossly intact, except for hearing loss.   readily moves all 4's.  sensation is intact to touch on the feet SKIN:  Normal texture and temperature.  No rash or suspicious lesion is visible.   NODES:  None palpable at the neck PSYCH: alert, well-oriented.  Does not appear anxious nor depressed.  Lab Results  Component Value Date   CREATININE 0.90 10/08/2017   BUN 27 (H) 10/08/2017   NA 139 10/08/2017   K 4.2 10/08/2017   CL 101 10/08/2017   CO2 29 10/08/2017   Lab Results  Component Value Date   HGBA1C 7.5 (A) 12/11/2017   I have reviewed outside records, and summarized: Pt was noted to have elevated a1c, and referred here.  He was noted to work 3rd shift.  Alcohol consumption was noted to be reduced.     Assessment & Plan:  Insulin-requiring type 2 DM, with DR: new to me.  he needs increased rx  Patient Instructions  good diet and exercise significantly improve the control of your diabetes.  please let me know if you wish to be referred to a dietician.  high blood sugar is very risky to your health.  you should see an eye doctor and dentist every year.  It is very important to get all recommended vaccinations.  Controlling your blood pressure and cholesterol drastically reduces the damage diabetes does to your body.  Those who smoke should quit.  Please discuss these with your doctor.    check your blood sugar twice a day.  vary the time of day when you check, between before the 3 meals, and at bedtime.  also check if you have symptoms of your blood sugar being too high  or too low.  please keep a record of the readings and bring it to your next appointment here (or you can bring the meter itself).  You can write it on any piece of paper.  please call us sooner if your blood sugar goes below 70, or if you have a lot of readings over 200.   Here is a brochure, about the "V-GO" device.  Please let me know if you want to learn more. I have sent a prescription to your pharmacy, to change basaglar to "Tresiba," 45 units daily.  Also, please increase the humalog to 5-10 units 3 times a day (just before each meal) Please come back for a follow-up appointment in 3 months.

## 2017-12-11 NOTE — Telephone Encounter (Signed)
Returned pharmacist's call. Clarification provided. Verbalized acceptance and understanding.

## 2017-12-11 NOTE — Telephone Encounter (Signed)
sent 

## 2017-12-11 NOTE — Telephone Encounter (Signed)
Attempted to call pharmacy to provide clarification. Still at lunch until 230. Will call back later today.

## 2017-12-11 NOTE — Patient Instructions (Addendum)
good diet and exercise significantly improve the control of your diabetes.  please let me know if you wish to be referred to a dietician.  high blood sugar is very risky to your health.  you should see an eye doctor and dentist every year.  It is very important to get all recommended vaccinations.  Controlling your blood pressure and cholesterol drastically reduces the damage diabetes does to your body.  Those who smoke should quit.  Please discuss these with your doctor.  check your blood sugar twice a day.  vary the time of day when you check, between before the 3 meals, and at bedtime.  also check if you have symptoms of your blood sugar being too high or too low.  please keep a record of the readings and bring it to your next appointment here (or you can bring the meter itself).  You can write it on any piece of paper.  please call us sooner if your blood sugar goes below 70, or if you have a lot of readings over 200.   Here is a brochure, about the "V-GO" device.  Please let me know if you want to learn more. I have sent a prescription to your pharmacy, to change basaglar to "Tresiba," 45 units daily.  Also, please increase the humalog to 5-10 units 3 times a day (just before each meal) Please come back for a follow-up appointment in 3 months.

## 2017-12-11 NOTE — Telephone Encounter (Signed)
Pharmacy also wanted to let Dr. Loanne Drilling know they have the generic available if he would like to try it.

## 2017-12-11 NOTE — Telephone Encounter (Signed)
Insulin Pen Needle (PEN NEEDLES) 32G X 4 MM MISC  [facility] is calling in regards to the prescription that was sent over. They needed instructions and quantity.  They also would like to know if the generic would be okay   Please advise   PHONE- [phone]

## 2017-12-11 NOTE — Telephone Encounter (Signed)
Pharmacy has requested to send an RX for pen needles. They requested 32 gage 51mL Nano Ultra Fine. Please Advise, thanks

## 2018-03-03 ENCOUNTER — Other Ambulatory Visit: Payer: Self-pay

## 2018-03-03 ENCOUNTER — Other Ambulatory Visit: Payer: Self-pay | Admitting: Family Medicine

## 2018-03-03 DIAGNOSIS — Z794 Long term (current) use of insulin: Principal | ICD-10-CM

## 2018-03-03 DIAGNOSIS — E1139 Type 2 diabetes mellitus with other diabetic ophthalmic complication: Secondary | ICD-10-CM

## 2018-03-03 MED ORDER — METFORMIN HCL ER 500 MG PO TB24: 500.0000 mg | ORAL_TABLET | Freq: Two times a day (BID) | ORAL | 2 refills | Status: DC

## 2018-03-03 MED ORDER — FARXIGA 10 MG PO TABS: 10.0000 mg | ORAL_TABLET | Freq: Every day | ORAL | 2 refills | Status: DC

## 2018-03-04 ENCOUNTER — Telehealth: Payer: Self-pay

## 2018-03-04 DIAGNOSIS — E1139 Type 2 diabetes mellitus with other diabetic ophthalmic complication: Secondary | ICD-10-CM

## 2018-03-04 DIAGNOSIS — Z794 Long term (current) use of insulin: Principal | ICD-10-CM

## 2018-03-04 MED ORDER — METFORMIN HCL ER 500 MG PO TB24: 500.0000 mg | ORAL_TABLET | Freq: Two times a day (BID) | ORAL | 2 refills | Status: DC

## 2018-03-04 NOTE — Telephone Encounter (Signed)
Please advise. Rx states:  Take 1 tablet (500 mg total) by mouth 2 (two) times daily. Take 2 tablets by mouth BID

## 2018-03-04 NOTE — Telephone Encounter (Signed)
kelly from [facility] calling to clarify metformin dosage please call her at [phone]

## 2018-03-04 NOTE — Telephone Encounter (Signed)
I corrected and resent 

## 2018-03-10 ENCOUNTER — Telehealth: Payer: Self-pay | Admitting: Family Medicine

## 2018-03-10 ENCOUNTER — Encounter: Payer: Self-pay | Admitting: Endocrinology

## 2018-03-10 ENCOUNTER — Ambulatory Visit: Payer: Managed Care, Other (non HMO) | Admitting: Endocrinology

## 2018-03-10 ENCOUNTER — Other Ambulatory Visit: Payer: Self-pay

## 2018-03-10 VITALS — BP 144/88 | HR 94 | Ht 66.0 in | Wt 176.0 lb

## 2018-03-10 DIAGNOSIS — Z794 Long term (current) use of insulin: Secondary | ICD-10-CM | POA: Diagnosis not present

## 2018-03-10 DIAGNOSIS — E1139 Type 2 diabetes mellitus with other diabetic ophthalmic complication: Secondary | ICD-10-CM

## 2018-03-10 LAB — POCT GLYCOSYLATED HEMOGLOBIN (HGB A1C): Hemoglobin A1C: 7 % — AB (ref 4.0–5.6)

## 2018-03-10 MED ORDER — GLUCOSE BLOOD VI STRP: 1.0000 | ORAL_STRIP | Freq: Two times a day (BID) | 3 refills | Status: DC

## 2018-03-10 MED ORDER — INSULIN DEGLUDEC 100 UNIT/ML ~~LOC~~ SOPN: 45.0000 [IU] | PEN_INJECTOR | Freq: Every day | SUBCUTANEOUS | 3 refills | Status: DC

## 2018-03-10 MED ORDER — FREESTYLE LIBRE 14 DAY READER DEVI: 1.0000 | Freq: Once | 0 refills | Status: AC

## 2018-03-10 MED ORDER — LOSARTAN POTASSIUM-HCTZ 100-25 MG PO TABS: 1.0000 | ORAL_TABLET | Freq: Every day | ORAL | 2 refills | Status: DC

## 2018-03-10 MED ORDER — FREESTYLE LIBRE 14 DAY SENSOR MISC: 1.0000 | 3 refills | Status: DC

## 2018-03-10 MED ORDER — SILDENAFIL CITRATE 100 MG PO TABS: 100.0000 mg | ORAL_TABLET | Freq: Every day | ORAL | 2 refills | Status: DC | PRN

## 2018-03-10 NOTE — Telephone Encounter (Signed)
Patient came into office and requested refill on losartan. Patient requesting refill request be sent to [facility] in the [place]. Please contact patient at [phone] when rx has been sent in.

## 2018-03-10 NOTE — Telephone Encounter (Signed)
Rx sent. I tried contacting patient but unable to leave him a voicemail.

## 2018-03-10 NOTE — Progress Notes (Signed)
Subjective:    Patient ID: John [name], male    DOB: [birth year], 58 y.o.   MRN: [id number]  HPI Pt returns for f/u of diabetes mellitus: DM type: Insulin-requiring type 2 Dx'ed: 3710 Complications: DR Therapy: insulin since 2012 DKA: never Severe hypoglycemia: never Pancreatitis: never Pancreatic imaging: never Other: he declines frequent f/u here, due to high copay;  He works 3rd shift, in grocery distribution Interval history: no cbg record, but states cbg's are well-controlled.  He says It is in general higher after eating.  Pt says he never misses the tresiba, but he does not miss the humalog sometimes.   Past Medical History:  Diagnosis Date  . Arthritis   . Diabetes mellitus without complication ([place])   . Hyperlipidemia   . Hypertension     Past Surgical History:  Procedure Laterality Date  . TONSILLECTOMY      Social History   Socioeconomic History  . Marital status: Married    Spouse name: Not on file  . Number of children: Not on file  . Years of education: Not on file  . Highest education level: Not on file  Occupational History  . Not on file  Social Needs  . Financial resource strain: Not on file  . Food insecurity:    Worry: Not on file    Inability: Not on file  . Transportation needs:    Medical: Not on file    Non-medical: Not on file  Tobacco Use  . Smoking status: Former Research scientist (life sciences)  . Smokeless tobacco: Never Used  . Tobacco comment: Quit in 2017   Substance and Sexual Activity  . Alcohol use: Yes    Comment: Socially  . Drug use: Never  . Sexual activity: Not on file  Lifestyle  . Physical activity:    Days per week: Not on file    Minutes per session: Not on file  . Stress: Not on file  Relationships  . Social connections:    Talks on phone: Not on file    Gets together: Not on file    Attends religious service: Not on file    Active member of club or organization: Not on file    Attends meetings of clubs or organizations:  Not on file    Relationship status: Not on file  . Intimate partner violence:    Fear of current or ex partner: Not on file    Emotionally abused: Not on file    Physically abused: Not on file    Forced sexual activity: Not on file  Other Topics Concern  . Not on file  Social History Narrative  . Not on file    Current Outpatient Medications on File Prior to Visit  Medication Sig Dispense Refill  . FARXIGA 10 MG TABS tablet TAKE ONE TABLET BY MOUTH DAILY 30 tablet 1  . insulin lispro (HUMALOG KWIKPEN) 100 UNIT/ML KwikPen Inject 0.05-0.1 mLs (5-10 Units total) into the skin 3 (three) times daily with meals. And pen needles 4/day 15 mL 11  . Insulin Pen Needle (PEN NEEDLES) 32G X 4 MM MISC 1 Package by Does not apply route 4 (four) times daily. 360 each 2  . metFORMIN (GLUCOPHAGE-XR) 500 MG 24 hr tablet Take 1 tablet (500 mg total) by mouth 2 (two) times daily. (Patient taking differently: Take 1,000 mg by mouth 2 (two) times daily. Take 2 tablets BID) 180 tablet 2  . OVER THE COUNTER MEDICATION Goody's powder two times daily.    Marland Kitchen  simvastatin (ZOCOR) 40 MG tablet      Current Facility-Administered Medications on File Prior to Visit  Medication Dose Route Frequency Provider Last Rate Last Dose  . 0.9 %  sodium chloride infusion  500 mL Intravenous Once Irene Shipper, MD        No Known Allergies  Family History  Problem Relation Age of Onset  . Diabetes Mother   . Colon cancer Neg Hx   . Esophageal cancer Neg Hx   . Rectal cancer Neg Hx   . Stomach cancer Neg Hx     BP (!) 144/88 (BP Location: Left Arm, Patient Position: Sitting, Cuff Size: Large)   Pulse 94   Ht 5\' 6"  (1.676 m)   Wt 176 lb (79.8 kg)   SpO2 96%   BMI 28.41 kg/m   Review of Systems He denies hypoglycemia.      Objective:   Physical Exam VITAL SIGNS:  See vs page GENERAL: no distress Pulses: dorsalis pedis intact bilat.   MSK: no deformity of the feet CV: no leg edema Skin:  no ulcer on the feet.   normal color and temp on the feet. Neuro: sensation is intact to touch on the feet.   Ext: There is bilateral onychomycosis of the toenails  Lab Results  Component Value Date   HGBA1C 7.0 (A) 03/10/2018        Assessment & Plan:  HTN: is noted today Insulin-requiring type 2 DM, with DR: well-controlled.  he again declines 18-month f/u.  Patient Instructions  check your blood sugar twice a day.  vary the time of day when you check, between before the 3 meals, and at bedtime.  also check if you have symptoms of your blood sugar being too high or too low.  please keep a record of the readings and bring it to your next appointment here (or you can bring the meter itself).  You can write it on any piece of paper.  please call us sooner if your blood sugar goes below 70, or if you have a lot of readings over 200.   Please continue the same medications for diabetes.   Please come back for a follow-up appointment in 6 months.

## 2018-03-10 NOTE — Patient Instructions (Addendum)
check your blood sugar twice a day.  vary the time of day when you check, between before the 3 meals, and at bedtime.  also check if you have symptoms of your blood sugar being too high or too low.  please keep a record of the readings and bring it to your next appointment here (or you can bring the meter itself).  You can write it on any piece of paper.  please call us sooner if your blood sugar goes below 70, or if you have a lot of readings over 200.   Please continue the same medications for diabetes.   Please come back for a follow-up appointment in 6 months.

## 2018-05-30 ENCOUNTER — Encounter: Payer: Self-pay | Admitting: Family Medicine

## 2018-05-30 ENCOUNTER — Ambulatory Visit (INDEPENDENT_AMBULATORY_CARE_PROVIDER_SITE_OTHER): Payer: Managed Care, Other (non HMO) | Admitting: Family Medicine

## 2018-05-30 DIAGNOSIS — I10 Essential (primary) hypertension: Secondary | ICD-10-CM | POA: Diagnosis not present

## 2018-05-30 DIAGNOSIS — E7800 Pure hypercholesterolemia, unspecified: Secondary | ICD-10-CM

## 2018-05-30 MED ORDER — SIMVASTATIN 40 MG PO TABS: 40.0000 mg | ORAL_TABLET | Freq: Every day | ORAL | 1 refills | Status: DC

## 2018-05-30 MED ORDER — LOSARTAN POTASSIUM-HCTZ 100-25 MG PO TABS: 1.0000 | ORAL_TABLET | Freq: Every day | ORAL | 1 refills | Status: DC

## 2018-05-30 NOTE — Progress Notes (Signed)
Established Patient Office Visit  Subjective:  Patient ID: John [name], male    DOB: 1960-05-29  Age: 58 y.o. MRN: [id number]  CC:  Chief Complaint  Patient presents with  . Follow-up    HPI John [name] presents for follow-up of his blood pressure and elevated cholesterol.  Claims compliance with his medications and has no issues taking them.  He is not regularly checking his blood pressure.  He has cut down significantly on his alcohol intake.  He has a beer every once in a while.  He does not smoke or use illicit drugs.  Continues to work third shift at [employer].  He is also active walking his dog through the [facility].   Past Medical History:  Diagnosis Date  . Arthritis   . Diabetes mellitus without complication ([place])   . Hyperlipidemia   . Hypertension     Past Surgical History:  Procedure Laterality Date  . TONSILLECTOMY      Family History  Problem Relation Age of Onset  . Diabetes Mother   . Colon cancer Neg Hx   . Esophageal cancer Neg Hx   . Rectal cancer Neg Hx   . Stomach cancer Neg Hx     Social History   Socioeconomic History  . Marital status: Married    Spouse name: Not on file  . Number of children: Not on file  . Years of education: Not on file  . Highest education level: Not on file  Occupational History  . Not on file  Social Needs  . Financial resource strain: Not on file  . Food insecurity:    Worry: Not on file    Inability: Not on file  . Transportation needs:    Medical: Not on file    Non-medical: Not on file  Tobacco Use  . Smoking status: Former Research scientist (life sciences)  . Smokeless tobacco: Never Used  . Tobacco comment: Quit in 2017   Substance and Sexual Activity  . Alcohol use: Yes    Comment: Socially  . Drug use: Never  . Sexual activity: Not on file  Lifestyle  . Physical activity:    Days per week: Not on file    Minutes per session: Not on file  . Stress: Not on file  Relationships  . Social  connections:    Talks on phone: Not on file    Gets together: Not on file    Attends religious service: Not on file    Active member of club or organization: Not on file    Attends meetings of clubs or organizations: Not on file    Relationship status: Not on file  . Intimate partner violence:    Fear of current or ex partner: Not on file    Emotionally abused: Not on file    Physically abused: Not on file    Forced sexual activity: Not on file  Other Topics Concern  . Not on file  Social History Narrative  . Not on file    Outpatient Medications Prior to Visit  Medication Sig Dispense Refill  . Continuous Blood Gluc Sensor (FREESTYLE LIBRE 14 DAY SENSOR) MISC 1 Device by Does not apply route every 14 (fourteen) days. 6 each 3  . FARXIGA 10 MG TABS tablet TAKE ONE TABLET BY MOUTH DAILY 30 tablet 1  . glucose blood test strip 1 each by Other route 2 (two) times daily. And lancets 2/day 200 each 3  .  insulin degludec (TRESIBA FLEXTOUCH) 100 UNIT/ML SOPN FlexTouch Pen Inject 0.45 mLs (45 Units total) into the skin daily. 15 pen 3  . insulin lispro (HUMALOG KWIKPEN) 100 UNIT/ML KwikPen Inject 0.05-0.1 mLs (5-10 Units total) into the skin 3 (three) times daily with meals. And pen needles 4/day 15 mL 11  . Insulin Pen Needle (PEN NEEDLES) 32G X 4 MM MISC 1 Package by Does not apply route 4 (four) times daily. 360 each 2  . metFORMIN (GLUCOPHAGE-XR) 500 MG 24 hr tablet Take 1 tablet (500 mg total) by mouth 2 (two) times daily. (Patient taking differently: Take 1,000 mg by mouth 2 (two) times daily. Take 2 tablets BID) 180 tablet 2  . OVER THE COUNTER MEDICATION Goody's powder two times daily.    . sildenafil (VIAGRA) 100 MG tablet Take 1 tablet (100 mg total) by mouth daily as needed for erectile dysfunction. 30 tablet 2  . losartan-hydrochlorothiazide (HYZAAR) 100-25 MG tablet Take 1 tablet by mouth daily. 90 tablet 2  . simvastatin (ZOCOR) 40 MG tablet      Facility-Administered  Medications Prior to Visit  Medication Dose Route Frequency Provider Last Rate Last Dose  . 0.9 %  sodium chloride infusion  500 mL Intravenous Once Irene Shipper, MD        No Known Allergies  ROS Review of Systems  Constitutional: Negative for chills, diaphoresis, fatigue, fever and unexpected weight change.  Respiratory: Negative.   Cardiovascular: Negative.   Gastrointestinal: Negative.   Endocrine: Negative for polyphagia and polyuria.  Neurological: Negative for headaches.  Psychiatric/Behavioral: Negative.       Objective:    Physical Exam  Constitutional: He is oriented to person, place, and time. No distress.  Pulmonary/Chest: Effort normal.  Neurological: He is alert and oriented to person, place, and time.  Psychiatric: He has a normal mood and affect. His behavior is normal.    There were no vitals taken for this visit. Wt Readings from Last 3 Encounters:  03/10/18 176 lb (79.8 kg)  12/11/17 167 lb 3.2 oz (75.8 kg)  11/14/17 170 lb (77.1 kg)     Health Maintenance Due  Topic Date Due  . Hepatitis C Screening  Dec 03, 1960  . PNEUMOCOCCAL POLYSACCHARIDE VACCINE AGE 39-64 HIGH RISK  05/25/1963  . OPHTHALMOLOGY EXAM  05/25/1971  . HIV Screening  05/24/1976  . TETANUS/TDAP  05/24/1980    There are no preventive care reminders to display for this patient.  Lab Results  Component Value Date   TSH 1.54 10/08/2017   Lab Results  Component Value Date   WBC 10.6 (H) 10/08/2017   HGB 16.0 10/08/2017   HCT 46.1 10/08/2017   MCV 93.9 10/08/2017   PLT 352.0 10/08/2017   Lab Results  Component Value Date   NA 139 10/08/2017   K 4.2 10/08/2017   CO2 29 10/08/2017   GLUCOSE 93 10/08/2017   BUN 27 (H) 10/08/2017   CREATININE 0.90 10/08/2017   BILITOT 0.5 10/08/2017   ALKPHOS 55 10/08/2017   AST 18 10/08/2017   ALT 21 10/08/2017   PROT 6.9 10/08/2017   ALBUMIN 4.6 10/08/2017   CALCIUM 9.8 10/08/2017   GFR 92.65 10/08/2017   Lab Results  Component  Value Date   CHOL 168 10/08/2017   Lab Results  Component Value Date   HDL 51.80 10/08/2017   Lab Results  Component Value Date   LDLCALC 101 (H) 10/08/2017   Lab Results  Component Value Date   TRIG 75.0 10/08/2017  Lab Results  Component Value Date   CHOLHDL 3 10/08/2017   Lab Results  Component Value Date   HGBA1C 7.0 (A) 03/10/2018      Assessment & Plan:   Problem List Items Addressed This Visit      Cardiovascular and Mediastinum   Essential hypertension - Primary   Relevant Medications   losartan-hydrochlorothiazide (HYZAAR) 100-25 MG tablet   simvastatin (ZOCOR) 40 MG tablet     Other   Elevated LDL cholesterol level   Relevant Medications   simvastatin (ZOCOR) 40 MG tablet      Meds ordered this encounter  Medications  . losartan-hydrochlorothiazide (HYZAAR) 100-25 MG tablet    Sig: Take 1 tablet by mouth daily.    Dispense:  90 tablet    Refill:  1  . simvastatin (ZOCOR) 40 MG tablet    Sig: Take 1 tablet (40 mg total) by mouth daily at 6 PM.    Dispense:  90 tablet    Refill:  1    Follow-up: Return in about 3 months (around 08/30/2018).    Libby Maw, MDVirtual Visit via Telephone Note  I connected with John [name] on 05/30/18 at  8:00 AM EDT by telephone and verified that I am speaking with the correct person using two identifiers.  Location: Patient: home Provider:    I discussed the limitations, risks, security and privacy concerns of performing an evaluation and management service by telephone and the availability of in person appointments. I also discussed with the patient that there may be a patient responsible charge related to this service. The patient expressed understanding and agreed to proceed.  Interactive video and audio telecommunications were attempted between myself and the patient. However they failed due to the patient having technical difficulties or not having access to video capability. We continued  and completed with audio only.  History of Present Illness:    Observations/Objective:   Assessment and Plan:   Follow Up Instructions:    I discussed the assessment and treatment plan with the patient. The patient was provided an opportunity to ask questions and all were answered. The patient agreed with the plan and demonstrated an understanding of the instructions.   The patient was advised to call back or seek an in-person evaluation if the symptoms worsen or if the condition fails to improve as anticipated.  I provided 25 minutes of non-face-to-face time during this encounter.    Patient will check and record his blood pressures and follow-up in 3 months for recheck.  He will call make an appointment to have his labs drawn and for the follow-up appointment.

## 2018-09-01 ENCOUNTER — Other Ambulatory Visit: Payer: Self-pay

## 2018-09-01 DIAGNOSIS — E1139 Type 2 diabetes mellitus with other diabetic ophthalmic complication: Secondary | ICD-10-CM

## 2018-09-01 MED ORDER — FARXIGA 10 MG PO TABS: 10.0000 mg | ORAL_TABLET | Freq: Every day | ORAL | 1 refills | Status: DC

## 2018-09-11 ENCOUNTER — Ambulatory Visit: Payer: Managed Care, Other (non HMO) | Admitting: Endocrinology

## 2018-11-13 ENCOUNTER — Telehealth: Payer: Self-pay | Admitting: Endocrinology

## 2018-11-13 DIAGNOSIS — E1139 Type 2 diabetes mellitus with other diabetic ophthalmic complication: Secondary | ICD-10-CM

## 2018-11-14 NOTE — Telephone Encounter (Signed)
1.  Please schedule f/u appt 2.  Then please refill x 1, pending that appt.  

## 2018-11-14 NOTE — Telephone Encounter (Signed)
Per Dr. Loanne Drilling, unable to refill Farxiga without an appt. Routing this message to the front desk for scheduling purposes.

## 2018-11-14 NOTE — Telephone Encounter (Signed)
Please advise 

## 2018-11-17 ENCOUNTER — Other Ambulatory Visit: Payer: Self-pay

## 2018-11-17 DIAGNOSIS — E1139 Type 2 diabetes mellitus with other diabetic ophthalmic complication: Secondary | ICD-10-CM

## 2018-11-17 DIAGNOSIS — Z794 Long term (current) use of insulin: Secondary | ICD-10-CM

## 2018-11-17 MED ORDER — FARXIGA 10 MG PO TABS: 10.0000 mg | ORAL_TABLET | Freq: Every day | ORAL | 0 refills | Status: DC

## 2018-11-17 NOTE — Telephone Encounter (Signed)
Patient is scheduled for appointment on 11/18/18 at 7:45 a.m.

## 2018-11-17 NOTE — Telephone Encounter (Signed)
Disp Refills Start End   FARXIGA 10 MG TABS tablet 30 tablet 0 11/17/2018    Sig - Route: Take 10 mg by mouth daily. - Oral   Sent to pharmacy as: FARXIGA 10 MG Tab tablet   E-Prescribing Status: Receipt confirmed by pharmacy (11/17/2018 12:56 PM EST)

## 2018-11-18 ENCOUNTER — Ambulatory Visit: Payer: Managed Care, Other (non HMO) | Admitting: Endocrinology

## 2018-11-18 ENCOUNTER — Encounter: Payer: Self-pay | Admitting: Endocrinology

## 2018-11-18 VITALS — BP 120/74 | HR 82 | Ht 66.0 in | Wt 172.8 lb

## 2018-11-18 DIAGNOSIS — Z9114 Patient's other noncompliance with medication regimen: Secondary | ICD-10-CM

## 2018-11-18 DIAGNOSIS — Z794 Long term (current) use of insulin: Secondary | ICD-10-CM | POA: Diagnosis not present

## 2018-11-18 DIAGNOSIS — E1139 Type 2 diabetes mellitus with other diabetic ophthalmic complication: Secondary | ICD-10-CM

## 2018-11-18 DIAGNOSIS — E11319 Type 2 diabetes mellitus with unspecified diabetic retinopathy without macular edema: Secondary | ICD-10-CM | POA: Diagnosis not present

## 2018-11-18 LAB — POCT GLYCOSYLATED HEMOGLOBIN (HGB A1C): Hemoglobin A1C: 7.7 % — AB (ref 4.0–5.6)

## 2018-11-18 MED ORDER — FARXIGA 10 MG PO TABS: 10.0000 mg | ORAL_TABLET | Freq: Every day | ORAL | 3 refills | Status: DC

## 2018-11-18 MED ORDER — FREESTYLE LIBRE 14 DAY SENSOR MISC: 1.0000 | 3 refills | Status: DC

## 2018-11-18 MED ORDER — SILDENAFIL CITRATE 100 MG PO TABS: 100.0000 mg | ORAL_TABLET | Freq: Every day | ORAL | 2 refills | Status: DC | PRN

## 2018-11-18 NOTE — Progress Notes (Signed)
Subjective:    Patient ID: John [name], male    DOB: Apr 28, 1960, 58 y.o.   MRN: [id number]  HPI Pt returns for f/u of diabetes mellitus: DM type: Insulin-requiring type 2 Dx'ed: 123XX123 Complications: DR Therapy: insulin since 2012 DKA: never Severe hypoglycemia: never Pancreatitis: never Pancreatic imaging: never Other: he declines frequent f/u here, due to high copay;  He works 3rd shift, in grocery distribution; he takes multiple daily injections; he is not using continuous glucose monitor, due to cost.    Interval history: no cbg record, but states cbg's vary from 95-200.  He says It is in general higher after eating.  Pt says he never misses the tresiba, but he sometimes misses the humalog.  He ran out of [place] 3 days ago.   Past Medical History:  Diagnosis Date  . Arthritis   . Diabetes mellitus without complication ([place])   . Hyperlipidemia   . Hypertension     Past Surgical History:  Procedure Laterality Date  . TONSILLECTOMY      Social History   Socioeconomic History  . Marital status: Married    Spouse name: Not on file  . Number of children: Not on file  . Years of education: Not on file  . Highest education level: Not on file  Occupational History  . Not on file  Social Needs  . Financial resource strain: Not on file  . Food insecurity    Worry: Not on file    Inability: Not on file  . Transportation needs    Medical: Not on file    Non-medical: Not on file  Tobacco Use  . Smoking status: Former Research scientist (life sciences)  . Smokeless tobacco: Never Used  . Tobacco comment: Quit in 2017   Substance and Sexual Activity  . Alcohol use: Yes    Comment: Socially  . Drug use: Never  . Sexual activity: Not on file  Lifestyle  . Physical activity    Days per week: Not on file    Minutes per session: Not on file  . Stress: Not on file  Relationships  . Social Herbalist on phone: Not on file    Gets together: Not on file    Attends religious service:  Not on file    Active member of club or organization: Not on file    Attends meetings of clubs or organizations: Not on file    Relationship status: Not on file  . Intimate partner violence    Fear of current or ex partner: Not on file    Emotionally abused: Not on file    Physically abused: Not on file    Forced sexual activity: Not on file  Other Topics Concern  . Not on file  Social History Narrative  . Not on file    Current Outpatient Medications on File Prior to Visit  Medication Sig Dispense Refill  . glucose blood test strip 1 each by Other route 2 (two) times daily. And lancets 2/day 200 each 3  . insulin degludec (TRESIBA FLEXTOUCH) 100 UNIT/ML SOPN FlexTouch Pen Inject 0.45 mLs (45 Units total) into the skin daily. 15 pen 3  . insulin lispro (HUMALOG KWIKPEN) 100 UNIT/ML KwikPen Inject 0.05-0.1 mLs (5-10 Units total) into the skin 3 (three) times daily with meals. And pen needles 4/day (Patient taking differently: Inject 5-10 Units into the skin 3 (three) times daily with meals. ) 15 mL 11  . Insulin Pen Needle (PEN NEEDLES) 32G  X 4 MM MISC 1 Package by Does not apply route 4 (four) times daily. 360 each 2  . losartan-hydrochlorothiazide (HYZAAR) 100-25 MG tablet Take 1 tablet by mouth daily. 90 tablet 1  . metFORMIN (GLUCOPHAGE-XR) 500 MG 24 hr tablet Take 1 tablet (500 mg total) by mouth 2 (two) times daily. (Patient taking differently: Take 500 mg by mouth 2 (two) times daily. Take 2 (1000 mg total) tablets BID) 180 tablet 2  . OVER THE COUNTER MEDICATION Goody's powder two times daily.    . simvastatin (ZOCOR) 40 MG tablet Take 1 tablet (40 mg total) by mouth daily at 6 PM. 90 tablet 1   Current Facility-Administered Medications on File Prior to Visit  Medication Dose Route Frequency Provider Last Rate Last Dose  . 0.9 %  sodium chloride infusion  500 mL Intravenous Once Irene Shipper, MD        No Known Allergies  Family History  Problem Relation Age of Onset  .  Diabetes Mother   . Colon cancer Neg Hx   . Esophageal cancer Neg Hx   . Rectal cancer Neg Hx   . Stomach cancer Neg Hx     BP 120/74 (BP Location: Right Arm, Patient Position: Sitting, Cuff Size: Normal)   Pulse 82   Ht 5\' 6"  (1.676 m)   Wt 172 lb 12.8 oz (78.4 kg)   SpO2 100%   BMI 27.89 kg/m   Review of Systems He denies hypoglycemia.     Objective:   Physical Exam VITAL SIGNS:  See vs page GENERAL: no distress Pulses: dorsalis pedis intact bilat.   MSK: no deformity of the feet CV: no leg edema.   Skin:  no ulcer on the feet.  normal color and temp on the feet.  Neuro: sensation is intact to touch on the feet.   Ext: there is bilateral onychomycosis of the toenails.    Lab Results  Component Value Date   HGBA1C 7.7 (A) 11/18/2018   Lab Results  Component Value Date   CREATININE 0.90 10/08/2017   BUN 27 (H) 10/08/2017   NA 139 10/08/2017   K 4.2 10/08/2017   CL 101 10/08/2017   CO2 29 10/08/2017       Assessment & Plan:  Insulin-requiring type 2 DM: worse.   Noncompliance with meds.  This limits rx efficacy.   Patient Instructions  check your blood sugar twice a day.  vary the time of day when you check, between before the 3 meals, and at bedtime.  also check if you have symptoms of your blood sugar being too high or too low.  please keep a record of the readings and bring it to your next appointment here (or you can bring the meter itself).  You can write it on any piece of paper.  please call us sooner if your blood sugar goes below 70, or if you have a lot of readings over 200.   Please continue the same medications for diabetes.  You should carry the humalog pen with you.  It does not need to be refrigerated.  Please come back for a follow-up appointment in 6 months.

## 2018-11-18 NOTE — Patient Instructions (Signed)
check your blood sugar twice a day.  vary the time of day when you check, between before the 3 meals, and at bedtime.  also check if you have symptoms of your blood sugar being too high or too low.  please keep a record of the readings and bring it to your next appointment here (or you can bring the meter itself).  You can write it on any piece of paper.  please call us sooner if your blood sugar goes below 70, or if you have a lot of readings over 200.   Please continue the same medications for diabetes.  You should carry the humalog pen with you.  It does not need to be refrigerated.  Please come back for a follow-up appointment in 6 months.

## 2018-12-23 ENCOUNTER — Other Ambulatory Visit: Payer: Self-pay | Admitting: Endocrinology

## 2018-12-23 ENCOUNTER — Other Ambulatory Visit: Payer: Self-pay | Admitting: Family Medicine

## 2018-12-23 DIAGNOSIS — E1139 Type 2 diabetes mellitus with other diabetic ophthalmic complication: Secondary | ICD-10-CM

## 2018-12-23 DIAGNOSIS — Z794 Long term (current) use of insulin: Secondary | ICD-10-CM

## 2018-12-23 DIAGNOSIS — E7800 Pure hypercholesterolemia, unspecified: Secondary | ICD-10-CM

## 2018-12-23 NOTE — Telephone Encounter (Signed)
Attempted to reach pt, no answer. Vm is not set up. Pt is overdue for an office visit

## 2019-01-27 ENCOUNTER — Other Ambulatory Visit: Payer: Self-pay | Admitting: Endocrinology

## 2019-04-27 ENCOUNTER — Other Ambulatory Visit: Payer: Self-pay | Admitting: Family Medicine

## 2019-04-27 DIAGNOSIS — E7800 Pure hypercholesterolemia, unspecified: Secondary | ICD-10-CM

## 2019-04-27 NOTE — Telephone Encounter (Signed)
/  Last OV /05/30/18 Last fill 12/23/18  #60/0

## 2019-05-21 ENCOUNTER — Encounter: Payer: Self-pay | Admitting: Endocrinology

## 2019-05-21 ENCOUNTER — Other Ambulatory Visit: Payer: Self-pay

## 2019-05-21 ENCOUNTER — Ambulatory Visit: Payer: Managed Care, Other (non HMO) | Admitting: Endocrinology

## 2019-05-21 VITALS — BP 112/70 | HR 86 | Ht 66.0 in | Wt 173.0 lb

## 2019-05-21 DIAGNOSIS — E1139 Type 2 diabetes mellitus with other diabetic ophthalmic complication: Secondary | ICD-10-CM | POA: Diagnosis not present

## 2019-05-21 DIAGNOSIS — Z794 Long term (current) use of insulin: Secondary | ICD-10-CM

## 2019-05-21 LAB — POCT GLYCOSYLATED HEMOGLOBIN (HGB A1C): Hemoglobin A1C: 8.3 % — AB (ref 4.0–5.6)

## 2019-05-21 MED ORDER — INSULIN LISPRO (1 UNIT DIAL) 100 UNIT/ML (KWIKPEN): 5.0000 [IU] | PEN_INJECTOR | Freq: Three times a day (TID) | SUBCUTANEOUS | 11 refills | Status: DC

## 2019-05-21 MED ORDER — METFORMIN HCL ER 500 MG PO TB24: 500.0000 mg | ORAL_TABLET | Freq: Two times a day (BID) | ORAL | 3 refills | Status: DC

## 2019-05-21 MED ORDER — SILDENAFIL CITRATE 100 MG PO TABS: 100.0000 mg | ORAL_TABLET | Freq: Every day | ORAL | 2 refills | Status: DC | PRN

## 2019-05-21 MED ORDER — TRESIBA FLEXTOUCH 100 UNIT/ML ~~LOC~~ SOPN: 50.0000 [IU] | PEN_INJECTOR | Freq: Every day | SUBCUTANEOUS | 0 refills | Status: DC

## 2019-05-21 NOTE — Patient Instructions (Addendum)
Please increase the tresiba to 50 units daily. Please continue the same other medications check your blood sugar twice a day.  vary the time of day when you check, between before the 3 meals, and at bedtime.  also check if you have symptoms of your blood sugar being too high or too low.  please keep a record of the readings and bring it to your next appointment here (or you can bring the meter itself).  You can write it on any piece of paper.  please call us sooner if your blood sugar goes below 70, or if you have a lot of readings over 200. Please come back for a follow-up appointment in 3-6 months.

## 2019-05-21 NOTE — Progress Notes (Signed)
Subjective:    Patient ID: John [name], male    DOB: [birth year], 59 y.o.   MRN: [id number]  HPI Pt returns for f/u of diabetes mellitus: DM type: Insulin-requiring type 2 Dx'ed: 123XX123 Complications: DR Therapy: insulin since 2012, and 2 oral agents.   DKA: never Severe hypoglycemia: never Pancreatitis: never Pancreatic imaging: never SDOH: he declines more frequent f/u here, due to high copay; he is not using continuous glucose monitor, due to cost.   Other: He works 3rd shift, in [place]; he takes multiple daily injections.   Interval history: no cbg record, but states cbg's vary from 73-270.  He says It is in general higher after eating.  He takes meds as rx'ed.   Past Medical History:  Diagnosis Date  . Arthritis   . Diabetes mellitus without complication ([place])   . Hyperlipidemia   . Hypertension     Past Surgical History:  Procedure Laterality Date  . TONSILLECTOMY      Social History   Socioeconomic History  . Marital status: Married    Spouse name: Not on file  . Number of children: Not on file  . Years of education: Not on file  . Highest education level: Not on file  Occupational History  . Not on file  Tobacco Use  . Smoking status: Former Research scientist (life sciences)  . Smokeless tobacco: Never Used  . Tobacco comment: Quit in 2017   Substance and Sexual Activity  . Alcohol use: Yes    Comment: Socially  . Drug use: Never  . Sexual activity: Not on file  Other Topics Concern  . Not on file  Social History Narrative  . Not on file   Social Determinants of Health   Financial Resource Strain:   . Difficulty of Paying Living Expenses:   Food Insecurity:   . Worried About Charity fundraiser in the Last Year:   . Arboriculturist in the Last Year:   Transportation Needs:   . Film/video editor (Medical):   Marland Kitchen Lack of Transportation (Non-Medical):   Physical Activity:   . Days of Exercise per Week:   . Minutes of Exercise per Session:   Stress:    . Feeling of Stress :   Social Connections:   . Frequency of Communication with Friends and Family:   . Frequency of Social Gatherings with Friends and Family:   . Attends Religious Services:   . Active Member of Clubs or Organizations:   . Attends Archivist Meetings:   Marland Kitchen Marital Status:   Intimate Partner Violence:   . Fear of Current or Ex-Partner:   . Emotionally Abused:   Marland Kitchen Physically Abused:   . Sexually Abused:     Current Outpatient Medications on File Prior to Visit  Medication Sig Dispense Refill  . Continuous Blood Gluc Sensor (FREESTYLE LIBRE 14 DAY SENSOR) MISC 1 Device by Does not apply route every 14 (fourteen) days. 6 each 3  . FARXIGA 10 MG TABS tablet Take 10 mg by mouth daily. 90 tablet 3  . glucose blood test strip 1 each by Other route 2 (two) times daily. And lancets 2/day 200 each 3  . Insulin Pen Needle (PEN NEEDLES) 32G X 4 MM MISC 1 Package by Does not apply route 4 (four) times daily. 360 each 2  . losartan-hydrochlorothiazide (HYZAAR) 100-25 MG tablet Take 1 tablet by mouth daily. 90 tablet 1  . OVER THE COUNTER MEDICATION Goody's powder two times  daily.    . simvastatin (ZOCOR) 40 MG tablet TAKE ONE TABLET BY MOUTH DAILY AT 6PM **OFFICE VISIT NEEDED** 60 tablet 0   Current Facility-Administered Medications on File Prior to Visit  Medication Dose Route Frequency Provider Last Rate Last Admin  . 0.9 %  sodium chloride infusion  500 mL Intravenous Once Irene Shipper, MD        No Known Allergies  Family History  Problem Relation Age of Onset  . Diabetes Mother   . Colon cancer Neg Hx   . Esophageal cancer Neg Hx   . Rectal cancer Neg Hx   . Stomach cancer Neg Hx     BP 112/70   Pulse 86   Ht 5\' 6"  (1.676 m)   Wt 173 lb (78.5 kg)   SpO2 97%   BMI 27.92 kg/m    Review of Systems He denies hypoglycemia.      Objective:   Physical Exam VITAL SIGNS:  See vs page GENERAL: no distress Pulses: dorsalis pedis intact bilat.   MSK:  no deformity of the feet CV: no leg edema Skin:  no ulcer on the feet, but the skin is dry and scaly.  normal color and temp on the feet. Neuro: sensation is intact to touch on the feet Ext: there is bilateral onychomycosis of the toenails.    A1c=8.3%     Assessment & Plan:  Insulin-requiring type 2 DM, with DR: worse.     Patient Instructions  Please increase the tresiba to 50 units daily. Please continue the same other medications check your blood sugar twice a day.  vary the time of day when you check, between before the 3 meals, and at bedtime.  also check if you have symptoms of your blood sugar being too high or too low.  please keep a record of the readings and bring it to your next appointment here (or you can bring the meter itself).  You can write it on any piece of paper.  please call us sooner if your blood sugar goes below 70, or if you have a lot of readings over 200. Please come back for a follow-up appointment in 3-6 months.

## 2019-07-16 ENCOUNTER — Other Ambulatory Visit: Payer: Self-pay | Admitting: Family Medicine

## 2019-07-16 ENCOUNTER — Other Ambulatory Visit: Payer: Self-pay | Admitting: Endocrinology

## 2019-07-16 DIAGNOSIS — E7800 Pure hypercholesterolemia, unspecified: Secondary | ICD-10-CM

## 2019-07-16 DIAGNOSIS — I10 Essential (primary) hypertension: Secondary | ICD-10-CM

## 2019-08-24 ENCOUNTER — Other Ambulatory Visit: Payer: Self-pay | Admitting: Family Medicine

## 2019-08-24 ENCOUNTER — Other Ambulatory Visit: Payer: Self-pay | Admitting: Endocrinology

## 2019-08-24 DIAGNOSIS — I10 Essential (primary) hypertension: Secondary | ICD-10-CM

## 2019-08-24 DIAGNOSIS — E7800 Pure hypercholesterolemia, unspecified: Secondary | ICD-10-CM

## 2019-09-03 NOTE — Telephone Encounter (Signed)
Appt scheduled

## 2019-09-10 ENCOUNTER — Encounter: Payer: Self-pay | Admitting: Family Medicine

## 2019-09-10 ENCOUNTER — Ambulatory Visit: Payer: Managed Care, Other (non HMO) | Admitting: Family Medicine

## 2019-09-10 ENCOUNTER — Other Ambulatory Visit: Payer: Self-pay

## 2019-09-10 VITALS — BP 110/72 | HR 98 | Temp 97.0°F | Ht 66.0 in | Wt 181.0 lb

## 2019-09-10 DIAGNOSIS — Z0000 Encounter for general adult medical examination without abnormal findings: Secondary | ICD-10-CM

## 2019-09-10 DIAGNOSIS — I10 Essential (primary) hypertension: Secondary | ICD-10-CM | POA: Diagnosis not present

## 2019-09-10 DIAGNOSIS — E7800 Pure hypercholesterolemia, unspecified: Secondary | ICD-10-CM

## 2019-09-10 DIAGNOSIS — H6123 Impacted cerumen, bilateral: Secondary | ICD-10-CM | POA: Diagnosis not present

## 2019-09-10 MED ORDER — SIMVASTATIN 40 MG PO TABS: 40.0000 mg | ORAL_TABLET | Freq: Every day | ORAL | 3 refills | Status: DC

## 2019-09-10 MED ORDER — DEBROX 6.5 % OT SOLN: 5.0000 [drp] | Freq: Two times a day (BID) | OTIC | 2 refills | Status: DC

## 2019-09-10 NOTE — Progress Notes (Signed)
Established Patient Office Visit  Subjective:  Patient ID: John [name], male    DOB: 1960-06-20  Age: 59 y.o. MRN: [id number]  CC:  Chief Complaint  Patient presents with  . Follow-up    Refill/follow up on medications.     HPI John [name] presents for for physical exam and follow-up of his hypertension and elevated cholesterol.  Lost to follow-up for over 16 months secondary to financial stress incurred with his wife cervical cancer.  She is doing better now back at work.  He had he has upper and lower dental plates and does not see the dentist any longer.  Appointment scheduled with eye doctor next month.  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  He has pains in his thighs near his knees when he bends and stoops.  There are no claudication type pains.  Past Medical History:  Diagnosis Date  . Arthritis   . Diabetes mellitus without complication ([place])   . Hyperlipidemia   . Hypertension     Past Surgical History:  Procedure Laterality Date  . TONSILLECTOMY      Family History  Problem Relation Age of Onset  . Diabetes Mother   . Colon cancer Neg Hx   . Esophageal cancer Neg Hx   . Rectal cancer Neg Hx   . Stomach cancer Neg Hx     Social History   Socioeconomic History  . Marital status: Married    Spouse name: Not on file  . Number of children: Not on file  . Years of education: Not on file  . Highest education level: Not on file  Occupational History  . Not on file  Tobacco Use  . Smoking status: Former Research scientist (life sciences)  . Smokeless tobacco: Never Used  . Tobacco comment: Quit in 2017   Vaping Use  . Vaping Use: Never used  Substance and Sexual Activity  . Alcohol use: Yes    Comment: Socially  . Drug use: Never  . Sexual activity: Not on file  Other Topics Concern  . Not on file  Social History Narrative  . Not on file   Social Determinants of Health   Financial Resource Strain:   . Difficulty of Paying Living Expenses: Not on file    Food Insecurity:   . Worried About Charity fundraiser in the Last Year: Not on file  . Ran Out of Food in the Last Year: Not on file  Transportation Needs:   . Lack of Transportation (Medical): Not on file  . Lack of Transportation (Non-Medical): Not on file  Physical Activity:   . Days of Exercise per Week: Not on file  . Minutes of Exercise per Session: Not on file  Stress:   . Feeling of Stress : Not on file  Social Connections:   . Frequency of Communication with Friends and Family: Not on file  . Frequency of Social Gatherings with Friends and Family: Not on file  . Attends Religious Services: Not on file  . Active Member of Clubs or Organizations: Not on file  . Attends Archivist Meetings: Not on file  . Marital Status: Not on file  Intimate Partner Violence:   . Fear of Current or Ex-Partner: Not on file  . Emotionally Abused: Not on file  . Physically Abused: Not on file  . Sexually Abused: Not on file    Outpatient Medications Prior to Visit  Medication Sig Dispense Refill  .  FARXIGA 10 MG TABS tablet Take 10 mg by mouth daily. 90 tablet 3  . glucose blood test strip 1 each by Other route 2 (two) times daily. And lancets 2/day 200 each 3  . insulin lispro (HUMALOG KWIKPEN) 100 UNIT/ML KwikPen Inject 0.05-0.1 mLs (5-10 Units total) into the skin 3 (three) times daily with meals. And pen needles 4/day 15 mL 11  . Insulin Pen Needle (PEN NEEDLES) 32G X 4 MM MISC 1 Package by Does not apply route 4 (four) times daily. 360 each 2  . losartan-hydrochlorothiazide (HYZAAR) 100-25 MG tablet TAKE ONE TABLET BY MOUTH DAILY **NO FURTHER REFILLS UNTIL DOCTOR VISIT** 30 tablet 0  . metFORMIN (GLUCOPHAGE-XR) 500 MG 24 hr tablet Take 1 tablet (500 mg total) by mouth 2 (two) times daily. 180 tablet 3  . OVER THE COUNTER MEDICATION Good[employer] powder two times daily.    . sildenafil (VIAGRA) 100 MG tablet Take 1 tablet (100 mg total) by mouth daily as needed for erectile  dysfunction. 30 tablet 2  . TRESIBA FLEXTOUCH 100 UNIT/ML FlexTouch Pen INJECT 0.5 ML INTO THE SKIN DAILY 15 mL 2  . simvastatin (ZOCOR) 40 MG tablet TAKE ONE TABLET BY MOUTH DAILY AT 6 IN THE EVENING **OFFICE VISIT NEEDED** 30 tablet 0  . Continuous Blood Gluc Sensor (FREESTYLE LIBRE 14 DAY SENSOR) MISC 1 Device by Does not apply route every 14 (fourteen) days. 6 each 3   Facility-Administered Medications Prior to Visit  Medication Dose Route Frequency Provider Last Rate Last Admin  . 0.9 %  sodium chloride infusion  500 mL Intravenous Once Irene Shipper, MD        No Known Allergies  ROS Review of Systems  Constitutional: Negative.   HENT: Negative.   Eyes: Negative for photophobia and visual disturbance.  Respiratory: Negative.   Cardiovascular: Negative.   Gastrointestinal: Negative.   Endocrine: Negative for polyphagia and polyuria.  Genitourinary: Negative for difficulty urinating, frequency and urgency.  Musculoskeletal: Positive for myalgias. Negative for arthralgias and gait problem.  Skin: Negative for pallor and rash.  Allergic/Immunologic: Negative for immunocompromised state.  Neurological: Negative for weakness and numbness.  Hematological: Does not bruise/bleed easily.  Psychiatric/Behavioral: Negative.       Objective:    Physical Exam Vitals and nursing note reviewed.  Constitutional:      General: He is not in acute distress.    Appearance: Normal appearance. He is not ill-appearing, toxic-appearing or diaphoretic.  HENT:     Head: Normocephalic and atraumatic.     Right Ear: There is impacted cerumen.     Left Ear: There is impacted cerumen.     Mouth/Throat:     Mouth: Mucous membranes are moist.     Pharynx: Oropharynx is clear. No posterior oropharyngeal erythema.  Eyes:     General:        Right eye: No discharge.        Left eye: No discharge.     Conjunctiva/sclera: Conjunctivae normal.     Pupils: Pupils are equal, round, and reactive to  light.  Cardiovascular:     Rate and Rhythm: Normal rate and regular rhythm.  Pulmonary:     Effort: Pulmonary effort is normal.     Breath sounds: Normal breath sounds.  Abdominal:     General: Abdomen is flat. Bowel sounds are normal. There is no distension.     Palpations: Abdomen is soft. There is no mass.     Tenderness: There is no abdominal  tenderness. There is no guarding or rebound.     Hernia: A hernia is present. Hernia is present in the ventral area.  Musculoskeletal:     Cervical back: No rigidity or tenderness.     Lumbar back: No bony tenderness. Normal range of motion. Negative right straight leg raise test and negative left straight leg raise test.     Right hip: Decreased range of motion (without pain).     Left hip: Normal.     Right lower leg: No edema.     Left lower leg: No edema.  Lymphadenopathy:     Cervical: No cervical adenopathy.  Skin:    General: Skin is warm and dry.  Neurological:     Mental Status: He is alert and oriented to person, place, and time.     Motor: Motor function is intact. No weakness.  Psychiatric:        Mood and Affect: Mood normal.        Behavior: Behavior normal.    Diabetic Foot Exam - Simple   Simple Foot Form Visual Inspection See comments: Yes Sensation Testing Intact to touch and monofilament testing bilaterally: Yes Pulse Check Posterior Tibialis and Dorsalis pulse intact bilaterally: Yes Comments  Feet are cavus bilaterally.  Toenails are thick and oncotic.  Capillary refill is brisk.     BP 110/72   Pulse 98   Temp (!) 97 F (36.1 C) (Tympanic)   Ht 5\' 6"  (1.676 m)   Wt 181 lb (82.1 kg)   SpO2 97%   BMI 29.21 kg/m  Wt Readings from Last 3 Encounters:  09/10/19 181 lb (82.1 kg)  05/21/19 173 lb (78.5 kg)  11/18/18 172 lb 12.8 oz (78.4 kg)     Health Maintenance Due  Topic Date Due  . Hepatitis C Screening  Never done  . PNEUMOCOCCAL POLYSACCHARIDE VACCINE AGE 42-64 HIGH RISK  Never done  .  OPHTHALMOLOGY EXAM  Never done  . HIV Screening  Never done  . TETANUS/TDAP  Never done  . FOOT EXAM  03/10/2019  . INFLUENZA VACCINE  08/02/2019    There are no preventive care reminders to display for this patient.  Lab Results  Component Value Date   TSH 1.54 10/08/2017   Lab Results  Component Value Date   WBC 10.6 (H) 10/08/2017   HGB 16.0 10/08/2017   HCT 46.1 10/08/2017   MCV 93.9 10/08/2017   PLT 352.0 10/08/2017   Lab Results  Component Value Date   NA 139 10/08/2017   K 4.2 10/08/2017   CO2 29 10/08/2017   GLUCOSE 93 10/08/2017   BUN 27 (H) 10/08/2017   CREATININE 0.90 10/08/2017   BILITOT 0.5 10/08/2017   ALKPHOS 55 10/08/2017   AST 18 10/08/2017   ALT 21 10/08/2017   PROT 6.9 10/08/2017   ALBUMIN 4.6 10/08/2017   CALCIUM 9.8 10/08/2017   GFR 92.65 10/08/2017   Lab Results  Component Value Date   CHOL 168 10/08/2017   Lab Results  Component Value Date   HDL 51.80 10/08/2017   Lab Results  Component Value Date   LDLCALC 101 (H) 10/08/2017   Lab Results  Component Value Date   TRIG 75.0 10/08/2017   Lab Results  Component Value Date   CHOLHDL 3 10/08/2017   Lab Results  Component Value Date   HGBA1C 8.3 (A) 05/21/2019      Assessment & Plan:   Problem List Items Addressed This Visit  Cardiovascular and Mediastinum   Essential hypertension - Primary   Relevant Medications   simvastatin (ZOCOR) 40 MG tablet   Other Relevant Orders   CBC   Comprehensive metabolic panel   Microalbumin / creatinine urine ratio     Nervous and Auditory   Excessive cerumen in both ear canals   Relevant Medications   carbamide peroxide (DEBROX) 6.5 % OTIC solution     Other   Elevated LDL cholesterol level   Relevant Medications   simvastatin (ZOCOR) 40 MG tablet   Healthcare maintenance   Relevant Orders   LDL cholesterol, direct   Lipid panel   Urinalysis, Routine w reflex microscopic   PSA      Meds ordered this encounter    Medications  . carbamide peroxide (DEBROX) 6.5 % OTIC solution    Sig: Place 5 drops into both ears 2 (two) times daily.    Dispense:  15 mL    Refill:  2  . simvastatin (ZOCOR) 40 MG tablet    Sig: Take 1 tablet (40 mg total) by mouth daily at 6 PM.    Dispense:  90 tablet    Refill:  3    Follow-up: Return in about 6 months (around 03/09/2020), or return fasting for blood work..  Patient is aware that we need at least yearly checkups if not twice yearly.  Agrees to come back fasting for blood work.  Suggested strengthening exercises for his anterior thigh pain.   Libby Maw, MD

## 2019-09-10 NOTE — Patient Instructions (Signed)
Earwax Buildup, Adult The ears produce a substance called earwax that helps keep bacteria out of the ear and protects the skin in the ear canal. Occasionally, earwax can build up in the ear and cause discomfort or hearing loss. What increases the risk? This condition is more likely to develop in people who:  Are male.  Are elderly.  Naturally produce more earwax.  Clean their ears often with cotton swabs.  Use earplugs often.  Use in-ear headphones often.  Wear hearing aids.  Have narrow ear canals.  Have earwax that is overly thick or sticky.  Have eczema.  Are dehydrated.  Have excess hair in the ear canal. What are the signs or symptoms? Symptoms of this condition include:  Reduced or muffled hearing.  A feeling of fullness in the ear or feeling that the ear is plugged.  Fluid coming from the ear.  Ear pain.  Ear itch.  Ringing in the ear.  Coughing.  An obvious piece of earwax that can be seen inside the ear canal. How is this diagnosed? This condition may be diagnosed based on:  Your symptoms.  Your medical history.  An ear exam. During the exam, your health care provider will look into your ear with an instrument called an otoscope. You may have tests, including a hearing test. How is this treated? This condition may be treated by:  Using ear drops to soften the earwax.  Having the earwax removed by a health care provider. The health care provider may: ? Flush the ear with water. ? Use an instrument that has a loop on the end (curette). ? Use a suction device.  Surgery to remove the wax buildup. This may be done in severe cases. Follow these instructions at home:   Take over-the-counter and prescription medicines only as told by your health care provider.  Do not put any objects, including cotton swabs, into your ear. You can clean the opening of your ear canal with a washcloth or facial tissue.  Follow instructions from your health care  provider about cleaning your ears. Do not over-clean your ears.  Drink enough fluid to keep your urine clear or pale yellow. This will help to thin the earwax.  Keep all follow-up visits as told by your health care provider. If earwax builds up in your ears often or if you use hearing aids, consider seeing your health care provider for routine, preventive ear cleanings. Ask your health care provider how often you should schedule your cleanings.  If you have hearing aids, clean them according to instructions from the manufacturer and your health care provider. Contact a health care provider if:  You have ear pain.  You develop a fever.  You have blood, pus, or other fluid coming from your ear.  You have hearing loss.  You have ringing in your ears that does not go away.  Your symptoms do not improve with treatment.  You feel like the room is spinning (vertigo). Summary  Earwax can build up in the ear and cause discomfort or hearing loss.  The most common symptoms of this condition include reduced or muffled hearing and a feeling of fullness in the ear or feeling that the ear is plugged.  This condition may be diagnosed based on your symptoms, your medical history, and an ear exam.  This condition may be treated by using ear drops to soften the earwax or by having the earwax removed by a health care provider.  Do not put any  objects, including cotton swabs, into your ear. You can clean the opening of your ear canal with a washcloth or facial tissue. This information is not intended to replace advice given to you by your health care provider. Make sure you discuss any questions you have with your health care provider. Document Revised: 11/30/2016 Document Reviewed: 02/29/2016 Elsevier Patient Education  2020 [place] Maintenance, Male Adopting a healthy lifestyle and getting preventive care are important in promoting health and wellness. Ask your health care provider  about:  The right schedule for you to have regular tests and exams.  Things you can do on your own to prevent diseases and keep yourself healthy. What should I know about diet, weight, and exercise? Eat a healthy diet   Eat a diet that includes plenty of vegetables, fruits, low-fat dairy products, and lean protein.  Do not eat a lot of foods that are high in solid fats, added sugars, or sodium. Maintain a healthy weight Body mass index (BMI) is a measurement that can be used to identify possible weight problems. It estimates body fat based on height and weight. Your health care provider can help determine your BMI and help you achieve or maintain a healthy weight. Get regular exercise Get regular exercise. This is one of the most important things you can do for your health. Most adults should:  Exercise for at least 150 minutes each week. The exercise should increase your heart rate and make you sweat (moderate-intensity exercise).  Do strengthening exercises at least twice a week. This is in addition to the moderate-intensity exercise.  Spend less time sitting. Even light physical activity can be beneficial. Watch cholesterol and blood lipids Have your blood tested for lipids and cholesterol at 58 years of age, then have this test every 5 years. You may need to have your cholesterol levels checked more often if:  Your lipid or cholesterol levels are high.  You are older than 58 years of age.  You are at high risk for heart disease. What should I know about cancer screening? Many types of cancers can be detected early and may often be prevented. Depending on your health history and family history, you may need to have cancer screening at various ages. This may include screening for:  Colorectal cancer.  Prostate cancer.  Skin cancer.  Lung cancer. What should I know about heart disease, diabetes, and high blood pressure? Blood pressure and heart disease  High blood  pressure causes heart disease and increases the risk of stroke. This is more likely to develop in people who have high blood pressure readings, are of African descent, or are overweight.  Talk with your health care provider about your target blood pressure readings.  Have your blood pressure checked: ? Every 3-5 years if you are 37-13 years of age. ? Every year if you are 108 years old or older.  If you are between the ages of 27 and 69 and are a current or former smoker, ask your health care provider if you should have a one-time screening for abdominal aortic aneurysm (AAA). Diabetes Have regular diabetes screenings. This checks your fasting blood sugar level. Have the screening done:  Once every three years after age 29 if you are at a normal weight and have a low risk for diabetes.  More often and at a younger age if you are overweight or have a high risk for diabetes. What should I know about preventing infection? Hepatitis B If you  have a higher risk for hepatitis B, you should be screened for this virus. Talk with your health care provider to find out if you are at risk for hepatitis B infection. Hepatitis C Blood testing is recommended for:  Everyone born from [birth year] through 1964.  Anyone with known risk factors for hepatitis C. Sexually transmitted infections (STIs)  You should be screened each year for STIs, including gonorrhea and chlamydia, if: ? You are sexually active and are younger than 58 years of age. ? You are older than 58 years of age and your health care provider tells you that you are at risk for this type of infection. ? Your sexual activity has changed since you were last screened, and you are at increased risk for chlamydia or gonorrhea. Ask your health care provider if you are at risk.  Ask your health care provider about whether you are at high risk for HIV. Your health care provider may recommend a prescription medicine to help prevent HIV infection. If you  choose to take medicine to prevent HIV, you should first get tested for HIV. You should then be tested every 3 months for as long as you are taking the medicine. Follow these instructions at home: Lifestyle  Do not use any products that contain nicotine or tobacco, such as cigarettes, e-cigarettes, and chewing tobacco. If you need help quitting, ask your health care provider.  Do not use street drugs.  Do not share needles.  Ask your health care provider for help if you need support or information about quitting drugs. Alcohol use  Do not drink alcohol if your health care provider tells you not to drink.  If you drink alcohol: ? Limit how much you have to 0-2 drinks a day. ? Be aware of how much alcohol is in your drink. In the [place] one drink equals one 12 oz bottle of beer (355 mL), one 5 oz glass of wine (148 mL), or one 1 oz glass of hard liquor (44 mL). General instructions  Schedule regular health, dental, and eye exams.  Stay current with your vaccines.  Tell your health care provider if: ? You often feel depressed. ? You have ever been abused or do not feel safe at home. Summary  Adopting a healthy lifestyle and getting preventive care are important in promoting health and wellness.  Follow your health care provider's instructions about healthy diet, exercising, and getting tested or screened for diseases.  Follow your health care provider's instructions on monitoring your cholesterol and blood pressure. This information is not intended to replace advice given to you by your health care provider. Make sure you discuss any questions you have with your health care provider. Document Revised: 12/11/2017 Document Reviewed: 12/11/2017 Elsevier Patient Education  2020 [employer]  Preventive Care 70-53 Years Old, Male Preventive care refers to lifestyle choices and visits with your health care provider that can promote health and wellness. This includes:  A yearly  physical exam. This is also called an annual well check.  Regular dental and eye exams.  Immunizations.  Screening for certain conditions.  Healthy lifestyle choices, such as eating a healthy diet, getting regular exercise, not using drugs or products that contain nicotine and tobacco, and limiting alcohol use. What can I expect for my preventive care visit? Physical exam Your health care provider will check:  Height and weight. These may be used to calculate body mass index (BMI), which is a measurement that tells if you are at a  healthy weight.  Heart rate and blood pressure.  Your skin for abnormal spots. Counseling Your health care provider may ask you questions about:  Alcohol, tobacco, and drug use.  Emotional well-being.  Home and relationship well-being.  Sexual activity.  Eating habits.  Work and work Statistician. What immunizations do I need?  Influenza (flu) vaccine  This is recommended every year. Tetanus, diphtheria, and pertussis (Tdap) vaccine  You may need a Td booster every 10 years. Varicella (chickenpox) vaccine  You may need this vaccine if you have not already been vaccinated. Zoster (shingles) vaccine  You may need this after age 37. Measles, mumps, and rubella (MMR) vaccine  You may need at least one dose of MMR if you were born in 1957 or later. You may also need a second dose. Pneumococcal conjugate (PCV13) vaccine  You may need this if you have certain conditions and were not previously vaccinated. Pneumococcal polysaccharide (PPSV23) vaccine  You may need one or two doses if you smoke cigarettes or if you have certain conditions. Meningococcal conjugate (MenACWY) vaccine  You may need this if you have certain conditions. Hepatitis A vaccine  You may need this if you have certain conditions or if you travel or work in places where you may be exposed to hepatitis A. Hepatitis B vaccine  You may need this if you have certain  conditions or if you travel or work in places where you may be exposed to hepatitis B. Haemophilus influenzae type b (Hib) vaccine  You may need this if you have certain risk factors. Human papillomavirus (HPV) vaccine  If recommended by your health care provider, you may need three doses over 6 months. You may receive vaccines as individual doses or as more than one vaccine together in one shot (combination vaccines). Talk with your health care provider about the risks and benefits of combination vaccines. What tests do I need? Blood tests  Lipid and cholesterol levels. These may be checked every 5 years, or more frequently if you are over 24 years old.  Hepatitis C test.  Hepatitis B test. Screening  Lung cancer screening. You may have this screening every year starting at age 67 if you have a 30-pack-year history of smoking and currently smoke or have quit within the past 15 years.  Prostate cancer screening. Recommendations will vary depending on your family history and other risks.  Colorectal cancer screening. All adults should have this screening starting at age 49 and continuing until age 36. Your health care provider may recommend screening at age 40 if you are at increased risk. You will have tests every 1-10 years, depending on your results and the type of screening test.  Diabetes screening. This is done by checking your blood sugar (glucose) after you have not eaten for a while (fasting). You may have this done every 1-3 years.  Sexually transmitted disease (STD) testing. Follow these instructions at home: Eating and drinking  Eat a diet that includes fresh fruits and vegetables, whole grains, lean protein, and low-fat dairy products.  Take vitamin and mineral supplements as recommended by your health care provider.  Do not drink alcohol if your health care provider tells you not to drink.  If you drink alcohol: ? Limit how much you have to 0-2 drinks a day. ? Be  aware of how much alcohol is in your drink. In the [place] one drink equals one 12 oz bottle of beer (355 mL), one 5 oz glass of wine (148 mL),  or one 1 oz glass of hard liquor (44 mL). Lifestyle  Take daily care of your teeth and gums.  Stay active. Exercise for at least 30 minutes on 5 or more days each week.  Do not use any products that contain nicotine or tobacco, such as cigarettes, e-cigarettes, and chewing tobacco. If you need help quitting, ask your health care provider.  If you are sexually active, practice safe sex. Use a condom or other form of protection to prevent STIs (sexually transmitted infections).  Talk with your health care provider about taking a low-dose aspirin every day starting at age 18. What's next?  Go to your health care provider once a year for a well check visit.  Ask your health care provider how often you should have your eyes and teeth checked.  Stay up to date on all vaccines. This information is not intended to replace advice given to you by your health care provider. Make sure you discuss any questions you have with your health care provider. Document Revised: 12/12/2017 Document Reviewed: 12/12/2017 Elsevier Patient Education  2020 [employer].

## 2019-09-15 ENCOUNTER — Other Ambulatory Visit: Payer: Managed Care, Other (non HMO)

## 2019-09-28 ENCOUNTER — Other Ambulatory Visit: Payer: Self-pay | Admitting: Family Medicine

## 2019-09-28 DIAGNOSIS — I10 Essential (primary) hypertension: Secondary | ICD-10-CM

## 2019-11-09 ENCOUNTER — Other Ambulatory Visit: Payer: Self-pay | Admitting: Endocrinology

## 2019-11-10 NOTE — Telephone Encounter (Signed)
Last OV-- 05/21/19 Last refill--05/21/19  Please advise

## 2019-11-20 ENCOUNTER — Encounter: Payer: Self-pay | Admitting: Endocrinology

## 2019-11-20 ENCOUNTER — Ambulatory Visit: Payer: Managed Care, Other (non HMO) | Admitting: Endocrinology

## 2019-11-20 ENCOUNTER — Other Ambulatory Visit: Payer: Self-pay

## 2019-11-20 VITALS — BP 130/82 | HR 92 | Ht 66.0 in | Wt 178.6 lb

## 2019-11-20 DIAGNOSIS — Z794 Long term (current) use of insulin: Secondary | ICD-10-CM | POA: Diagnosis not present

## 2019-11-20 DIAGNOSIS — E1139 Type 2 diabetes mellitus with other diabetic ophthalmic complication: Secondary | ICD-10-CM

## 2019-11-20 LAB — POCT GLYCOSYLATED HEMOGLOBIN (HGB A1C): Hemoglobin A1C: 8.2 % — AB (ref 4.0–5.6)

## 2019-11-20 MED ORDER — TRULICITY 0.75 MG/0.5ML ~~LOC~~ SOAJ: 0.7500 mg | SUBCUTANEOUS | 3 refills | Status: DC

## 2019-11-20 NOTE — Progress Notes (Signed)
Subjective:    Patient ID: John [name], male    DOB: September 22, 1960, 59 y.o.   MRN: [id number]  HPI Pt returns for f/u of diabetes mellitus: DM type: Insulin-requiring type 2 Dx'ed: 1017 Complications: DR Therapy: insulin since 2012, and 2 oral agents.   DKA: never Severe hypoglycemia: never Pancreatitis: never Pancreatic imaging: never.   SDOH: he declines more frequent f/u here, due to high copay; he is not using continuous glucose monitor, due to cost.   Other: He works 3rd shift, in grocery distribution; he takes multiple daily injections, but basal insulin is emphasized.   Interval history: no cbg record, but states cbg's vary from 119-270.  He says It is in general higher after eating.   Past Medical History:  Diagnosis Date  . Arthritis   . Diabetes mellitus without complication ([place])   . Hyperlipidemia   . Hypertension     Past Surgical History:  Procedure Laterality Date  . TONSILLECTOMY      Social History   Socioeconomic History  . Marital status: Married    Spouse name: Not on file  . Number of children: Not on file  . Years of education: Not on file  . Highest education level: Not on file  Occupational History  . Not on file  Tobacco Use  . Smoking status: Former Research scientist (life sciences)  . Smokeless tobacco: Never Used  . Tobacco comment: Quit in 2017   Vaping Use  . Vaping Use: Never used  Substance and Sexual Activity  . Alcohol use: Yes    Comment: Socially  . Drug use: Never  . Sexual activity: Not on file  Other Topics Concern  . Not on file  Social History Narrative  . Not on file   Social Determinants of Health   Financial Resource Strain:   . Difficulty of Paying Living Expenses: Not on file  Food Insecurity:   . Worried About Charity fundraiser in the Last Year: Not on file  . Ran Out of Food in the Last Year: Not on file  Transportation Needs:   . Lack of Transportation (Medical): Not on file  . Lack of Transportation (Non-Medical): Not on  file  Physical Activity:   . Days of Exercise per Week: Not on file  . Minutes of Exercise per Session: Not on file  Stress:   . Feeling of Stress : Not on file  Social Connections:   . Frequency of Communication with Friends and Family: Not on file  . Frequency of Social Gatherings with Friends and Family: Not on file  . Attends Religious Services: Not on file  . Active Member of Clubs or Organizations: Not on file  . Attends Archivist Meetings: Not on file  . Marital Status: Not on file  Intimate Partner Violence:   . Fear of Current or Ex-Partner: Not on file  . Emotionally Abused: Not on file  . Physically Abused: Not on file  . Sexually Abused: Not on file    Current Outpatient Medications on File Prior to Visit  Medication Sig Dispense Refill  . carbamide peroxide (DEBROX) 6.5 % OTIC solution Place 5 drops into both ears 2 (two) times daily. 15 mL 2  . Continuous Blood Gluc Sensor (FREESTYLE LIBRE 14 DAY SENSOR) MISC 1 Device by Does not apply route every 14 (fourteen) days. 6 each 3  . FARXIGA 10 MG TABS tablet Take 10 mg by mouth daily. 90 tablet 3  . glucose blood test  strip 1 each by Other route 2 (two) times daily. And lancets 2/day 200 each 3  . insulin lispro (HUMALOG KWIKPEN) 100 UNIT/ML KwikPen Inject 0.05-0.1 mLs (5-10 Units total) into the skin 3 (three) times daily with meals. And pen needles 4/day 15 mL 11  . Insulin Pen Needle (PEN NEEDLES) 32G X 4 MM MISC 1 Package by Does not apply route 4 (four) times daily. 360 each 2  . losartan-hydrochlorothiazide (HYZAAR) 100-25 MG tablet TAKE ONE TABLET BY MOUTH DAILY **NO FURTHER REFILLS UNTIL DOCTOR VISIT** 30 tablet 5  . metFORMIN (GLUCOPHAGE-XR) 500 MG 24 hr tablet Take 1 tablet (500 mg total) by mouth 2 (two) times daily. 180 tablet 3  . OVER THE COUNTER MEDICATION Goody's powder two times daily.    . sildenafil (VIAGRA) 100 MG tablet TAKE ONE TABLET BY MOUTH DAILY AS NEEDED FOR ERECTILE DYSFUNCTION 30 tablet  2  . simvastatin (ZOCOR) 40 MG tablet Take 1 tablet (40 mg total) by mouth daily at 6 PM. 90 tablet 3  . TRESIBA FLEXTOUCH 100 UNIT/ML FlexTouch Pen INJECT 0.5 ML INTO THE SKIN DAILY 15 mL 2   Current Facility-Administered Medications on File Prior to Visit  Medication Dose Route Frequency Provider Last Rate Last Admin  . 0.9 %  sodium chloride infusion  500 mL Intravenous Once Irene Shipper, MD        No Known Allergies  Family History  Problem Relation Age of Onset  . Diabetes Mother   . Colon cancer Neg Hx   . Esophageal cancer Neg Hx   . Rectal cancer Neg Hx   . Stomach cancer Neg Hx     BP 130/82   Pulse 92   Ht 5\' 6"  (1.676 m)   Wt 178 lb 9.6 oz (81 kg)   SpO2 99%   BMI 28.83 kg/m    Review of Systems He denies hypoglycemia.     Objective:   Physical Exam VITAL SIGNS:  See vs page GENERAL: no distress Pulses: dorsalis pedis intact bilat.   MSK: no deformity of the feet CV: no leg edema Skin:  no ulcer on the feet, but the skin is dry and scaly.  normal color and temp on the feet. Neuro: sensation is intact to touch on the feet Ext: there is bilateral onychomycosis of the toenails.    A1c=8.2%    Assessment & Plan:  Insulin-requiring type 2 DM, with DR: uncontrolled.   Patient Instructions  I have sent a prescription to your pharmacy, to add "Trulicity." Please continue the same other medications check your blood sugar twice a day.  vary the time of day when you check, between before the 3 meals, and at bedtime.  also check if you have symptoms of your blood sugar being too high or too low.  please keep a record of the readings and bring it to your next appointment here (or you can bring the meter itself).  You can write it on any piece of paper.  please call us sooner if your blood sugar goes below 70, or if you have a lot of readings over 200. Please come back for a follow-up appointment in 3 months.

## 2019-11-20 NOTE — Patient Instructions (Addendum)
I have sent a prescription to your pharmacy, to add "Trulicity." Please continue the same other medications check your blood sugar twice a day.  vary the time of day when you check, between before the 3 meals, and at bedtime.  also check if you have symptoms of your blood sugar being too high or too low.  please keep a record of the readings and bring it to your next appointment here (or you can bring the meter itself).  You can write it on any piece of paper.  please call us sooner if your blood sugar goes below 70, or if you have a lot of readings over 200. Please come back for a follow-up appointment in 3 months.

## 2019-12-21 ENCOUNTER — Other Ambulatory Visit: Payer: Self-pay | Admitting: Endocrinology

## 2019-12-21 DIAGNOSIS — E1139 Type 2 diabetes mellitus with other diabetic ophthalmic complication: Secondary | ICD-10-CM

## 2020-02-15 ENCOUNTER — Other Ambulatory Visit: Payer: Self-pay | Admitting: Endocrinology

## 2020-03-09 ENCOUNTER — Ambulatory Visit: Payer: Managed Care, Other (non HMO) | Admitting: Family Medicine

## 2020-03-09 ENCOUNTER — Other Ambulatory Visit: Payer: Self-pay

## 2020-03-11 ENCOUNTER — Other Ambulatory Visit: Payer: Self-pay

## 2020-03-11 ENCOUNTER — Ambulatory Visit: Payer: Managed Care, Other (non HMO) | Admitting: Endocrinology

## 2020-03-11 VITALS — BP 118/80 | HR 90 | Ht 69.0 in | Wt 176.6 lb

## 2020-03-11 DIAGNOSIS — Z794 Long term (current) use of insulin: Secondary | ICD-10-CM | POA: Diagnosis not present

## 2020-03-11 DIAGNOSIS — E1139 Type 2 diabetes mellitus with other diabetic ophthalmic complication: Secondary | ICD-10-CM | POA: Diagnosis not present

## 2020-03-11 LAB — POCT GLYCOSYLATED HEMOGLOBIN (HGB A1C): Hemoglobin A1C: 7.9 % — AB (ref 4.0–5.6)

## 2020-03-11 MED ORDER — TRESIBA FLEXTOUCH 100 UNIT/ML ~~LOC~~ SOPN: 40.0000 [IU] | PEN_INJECTOR | Freq: Every day | SUBCUTANEOUS | 3 refills | Status: DC

## 2020-03-11 MED ORDER — TRULICITY 1.5 MG/0.5ML ~~LOC~~ SOAJ: 1.5000 mg | SUBCUTANEOUS | 3 refills | Status: DC

## 2020-03-11 MED ORDER — FREESTYLE LIBRE 14 DAY SENSOR MISC
1.0000 | 3 refills | Status: DC
Start: 2020-03-11 — End: 2021-09-06

## 2020-03-11 NOTE — Patient Instructions (Addendum)
I have sent a prescription to your pharmacy, to increase the Trulicity, and: Reduce the Tresiba to 40 units per day, and: Please continue the same other medications check your blood sugar twice a day.  vary the time of day when you check, between before the 3 meals, and at bedtime.  also check if you have symptoms of your blood sugar being too high or too low.  please keep a record of the readings and bring it to your next appointment here (or you can bring the meter itself).  You can write it on any piece of paper.  please call us sooner if your blood sugar goes below 70, or if you have a lot of readings over 200. Please come back for a follow-up appointment in 2-3 months.

## 2020-03-11 NOTE — Progress Notes (Signed)
Subjective:    Patient ID: John [name], male    DOB: 06/05/1960, 60 y.o.   MRN: [id number]  HPI Pt returns for f/u of diabetes mellitus: DM type: Insulin-requiring type 2 Dx'ed: 6644 Complications: DR Therapy: insulin since 0347, Trulicity, and 2 oral agents.   DKA: never Severe hypoglycemia: never Pancreatitis: never Pancreatic imaging: never.   SDOH: he declines more frequent f/u here, due to high copay; he is not using continuous glucose monitor, due to cost.   Other: He works 3rd shift, in grocery distribution; he takes multiple daily injections, but basal insulin is emphasized.   Interval history: no cbg record, but states cbg's vary from 90-250.  He says It is in general higher after eating.  He takes meds as rx'ed.   Past Medical History:  Diagnosis Date   Arthritis    Diabetes mellitus without complication ([place])    Hyperlipidemia    Hypertension     Past Surgical History:  Procedure Laterality Date   TONSILLECTOMY      Social History   Socioeconomic History   Marital status: Married    Spouse name: Not on file   Number of children: Not on file   Years of education: Not on file   Highest education level: Not on file  Occupational History   Not on file  Tobacco Use   Smoking status: Former Smoker   Smokeless tobacco: Never Used   Tobacco comment: Quit in 2017   Vaping Use   Vaping Use: Never used  Substance and Sexual Activity   Alcohol use: Yes    Comment: Socially   Drug use: Never   Sexual activity: Not on file  Other Topics Concern   Not on file  Social History Narrative   Not on file   Social Determinants of Health   Financial Resource Strain: Not on file  Food Insecurity: Not on file  Transportation Needs: Not on file  Physical Activity: Not on file  Stress: Not on file  Social Connections: Not on file  Intimate Partner Violence: Not on file    Current Outpatient Medications on File Prior to Visit  Medication  Sig Dispense Refill   carbamide peroxide (DEBROX) 6.5 % OTIC solution Place 5 drops into both ears 2 (two) times daily. 15 mL 2   FARXIGA 10 MG TABS tablet TAKE ONE TABLET BY MOUTH DAILY 30 tablet 3   glucose blood test strip 1 each by Other route 2 (two) times daily. And lancets 2/day 200 each 3   insulin lispro (HUMALOG KWIKPEN) 100 UNIT/ML KwikPen Inject 0.05-0.1 mLs (5-10 Units total) into the skin 3 (three) times daily with meals. And pen needles 4/day 15 mL 11   Insulin Pen Needle (PEN NEEDLES) 32G X 4 MM MISC 1 Package by Does not apply route 4 (four) times daily. 360 each 2   losartan-hydrochlorothiazide (HYZAAR) 100-25 MG tablet TAKE ONE TABLET BY MOUTH DAILY **NO FURTHER REFILLS UNTIL DOCTOR VISIT** 30 tablet 5   metFORMIN (GLUCOPHAGE-XR) 500 MG 24 hr tablet Take 1 tablet (500 mg total) by mouth 2 (two) times daily. 180 tablet 3   OVER THE COUNTER MEDICATION Goody's powder two times daily.     sildenafil (VIAGRA) 100 MG tablet TAKE ONE TABLET BY MOUTH DAILY AS NEEDED FOR ERECTILE DYSFUNCTION 30 tablet 2   simvastatin (ZOCOR) 40 MG tablet Take 1 tablet (40 mg total) by mouth daily at 6 PM. 90 tablet 3   Current Facility-Administered Medications on File  Prior to Visit  Medication Dose Route Frequency Provider Last Rate Last Admin   0.9 %  sodium chloride infusion  500 mL Intravenous Once Irene Shipper, MD        No Known Allergies  Family History  Problem Relation Age of Onset   Diabetes Mother    Colon cancer Neg Hx    Esophageal cancer Neg Hx    Rectal cancer Neg Hx    Stomach cancer Neg Hx     BP 118/80 (BP Location: Right Arm, Patient Position: Sitting, Cuff Size: Normal)    Pulse 90    Ht 5\' 9"  (1.753 m)    Wt 176 lb 9.6 oz (80.1 kg)    SpO2 99%    BMI 26.08 kg/m    Review of Systems He denies hypoglycemia.      Objective:   Physical Exam VITAL SIGNS:  See vs page GENERAL: no distress Pulses: dorsalis pedis intact bilat.   MSK: no deformity of the  feet CV: no leg edema Skin:  no ulcer on the feet, but the skin is dry and scaly.  There are heavy calluses.  normal color and temp on the feet. Neuro: sensation is intact to touch on the feet Ext: there is bilateral onychomycosis of the toenails.    Lab Results  Component Value Date   HGBA1C 7.9 (A) 03/11/2020       Assessment & Plan:  Insulin-requiring type 2 DM, with DR: uncontrolled.   Patient Instructions  I have sent a prescription to your pharmacy, to increase the Trulicity, and: Reduce the Tresiba to 40 units per day, and: Please continue the same other medications check your blood sugar twice a day.  vary the time of day when you check, between before the 3 meals, and at bedtime.  also check if you have symptoms of your blood sugar being too high or too low.  please keep a record of the readings and bring it to your next appointment here (or you can bring the meter itself).  You can write it on any piece of paper.  please call us sooner if your blood sugar goes below 70, or if you have a lot of readings over 200. Please come back for a follow-up appointment in 2-3 months.

## 2020-03-28 ENCOUNTER — Telehealth: Payer: Self-pay | Admitting: Family Medicine

## 2020-03-28 ENCOUNTER — Encounter: Payer: Self-pay | Admitting: Family Medicine

## 2020-03-28 NOTE — Telephone Encounter (Signed)
Pt was no show for appt 03/09/20 for f/u. 1st occurrence. Fee waived. Letter mailed.  PCP,  Please reply back with corresponding letter matching appropriate follow up needs.  A - No follow up necessary B - Follow up urgent - locate patient immediately to schedule appointment. C - Follow up necessary. Contact patient and schedule visit w/in 7 days. D - Follow up necessary. Contact patient and schedule visit w/in 2-4 weeks.  E - Follow up necessary. Contact patient and schedule visit w/in 3 months.

## 2020-03-29 NOTE — Telephone Encounter (Signed)
D, thanks.

## 2020-05-01 DIAGNOSIS — I639 Cerebral infarction, unspecified: Secondary | ICD-10-CM

## 2020-05-01 HISTORY — DX: Cerebral infarction, unspecified: I63.9

## 2020-05-02 ENCOUNTER — Other Ambulatory Visit: Payer: Self-pay

## 2020-05-03 ENCOUNTER — Encounter: Payer: Self-pay | Admitting: Family Medicine

## 2020-05-03 ENCOUNTER — Ambulatory Visit: Payer: Managed Care, Other (non HMO) | Admitting: Family Medicine

## 2020-05-03 VITALS — BP 122/80 | HR 95 | Temp 97.8°F | Ht 69.0 in | Wt 172.8 lb

## 2020-05-03 DIAGNOSIS — Z125 Encounter for screening for malignant neoplasm of prostate: Secondary | ICD-10-CM | POA: Diagnosis not present

## 2020-05-03 DIAGNOSIS — Z0000 Encounter for general adult medical examination without abnormal findings: Secondary | ICD-10-CM

## 2020-05-03 DIAGNOSIS — H6123 Impacted cerumen, bilateral: Secondary | ICD-10-CM | POA: Diagnosis not present

## 2020-05-03 DIAGNOSIS — M79605 Pain in left leg: Secondary | ICD-10-CM

## 2020-05-03 DIAGNOSIS — Z794 Long term (current) use of insulin: Secondary | ICD-10-CM

## 2020-05-03 DIAGNOSIS — E7800 Pure hypercholesterolemia, unspecified: Secondary | ICD-10-CM

## 2020-05-03 DIAGNOSIS — E1139 Type 2 diabetes mellitus with other diabetic ophthalmic complication: Secondary | ICD-10-CM | POA: Diagnosis not present

## 2020-05-03 DIAGNOSIS — I10 Essential (primary) hypertension: Secondary | ICD-10-CM | POA: Diagnosis not present

## 2020-05-03 DIAGNOSIS — M79604 Pain in right leg: Secondary | ICD-10-CM

## 2020-05-03 LAB — CBC
HCT: 45.9 % (ref 39.0–52.0)
Hemoglobin: 15.5 g/dL (ref 13.0–17.0)
MCHC: 33.8 g/dL (ref 30.0–36.0)
MCV: 89.1 fl (ref 78.0–100.0)
Platelets: 384 10*3/uL (ref 150.0–400.0)
RBC: 5.15 Mil/uL (ref 4.22–5.81)
RDW: 13.9 % (ref 11.5–15.5)
WBC: 9 10*3/uL (ref 4.0–10.5)

## 2020-05-03 LAB — URINALYSIS, ROUTINE W REFLEX MICROSCOPIC
Bilirubin Urine: NEGATIVE
Hgb urine dipstick: NEGATIVE
Ketones, ur: NEGATIVE
Leukocytes,Ua: NEGATIVE
Nitrite: NEGATIVE
RBC / HPF: NONE SEEN (ref 0–?)
Specific Gravity, Urine: 1.025 (ref 1.000–1.030)
Total Protein, Urine: NEGATIVE
Urine Glucose: >=1000 — AB
Urobilinogen, UA: 0.2 (ref 0.0–1.0)
pH: 5.5 (ref 5.0–8.0)

## 2020-05-03 LAB — COMPREHENSIVE METABOLIC PANEL
ALT: 16 U/L (ref 0–53)
AST: 15 U/L (ref 0–37)
Albumin: 4.2 g/dL (ref 3.5–5.2)
Alkaline Phosphatase: 77 U/L (ref 39–117)
BUN: 24 mg/dL — ABNORMAL HIGH (ref 6–23)
CO2: 26 mEq/L (ref 19–32)
Calcium: 9.2 mg/dL (ref 8.4–10.5)
Chloride: 102 mEq/L (ref 96–112)
Creatinine, Ser: 0.82 mg/dL (ref 0.40–1.50)
GFR: 96.53 mL/min (ref >60.00)
Glucose, Bld: 112 mg/dL — ABNORMAL HIGH (ref 70–99)
Potassium: 4 mEq/L (ref 3.5–5.1)
Sodium: 137 mEq/L (ref 135–145)
Total Bilirubin: 0.7 mg/dL (ref 0.2–1.2)
Total Protein: 6.7 g/dL (ref 6.0–8.3)

## 2020-05-03 LAB — LIPID PANEL
Cholesterol: 154 mg/dL (ref 0–200)
HDL: 47.1 mg/dL (ref >39.00)
LDL Cholesterol: 90 mg/dL (ref 0–99)
NonHDL: 107.34
Total CHOL/HDL Ratio: 3
Triglycerides: 86 mg/dL (ref 0.0–149.0)
VLDL: 17.2 mg/dL (ref 0.0–40.0)

## 2020-05-03 LAB — MICROALBUMIN / CREATININE URINE RATIO
Creatinine,U: 101.9 mg/dL
Microalb Creat Ratio: 0.7 mg/g (ref 0.0–30.0)
Microalb, Ur: 0.8 mg/dL (ref 0.0–1.9)

## 2020-05-03 LAB — PSA: PSA: 0.72 ng/mL (ref 0.10–4.00)

## 2020-05-03 MED ORDER — ROSUVASTATIN CALCIUM 20 MG PO TABS: 20.0000 mg | ORAL_TABLET | Freq: Every day | ORAL | 3 refills | Status: DC

## 2020-05-03 MED ORDER — DEBROX 6.5 % OT SOLN: 5.0000 [drp] | Freq: Two times a day (BID) | OTIC | 3 refills | Status: DC

## 2020-05-03 MED ORDER — IBUPROFEN 800 MG PO TABS: 800.0000 mg | ORAL_TABLET | Freq: Three times a day (TID) | ORAL | 0 refills | Status: DC | PRN

## 2020-05-03 NOTE — Addendum Note (Signed)
Addended by: Jon Billings on: 05/03/2020 03:31 PM   Modules accepted: Orders

## 2020-05-03 NOTE — Progress Notes (Addendum)
Established Patient Office Visit  Subjective:  Patient ID: John [name], male    DOB: 07/24/1960  Age: 60 y.o. MRN: [id number]  CC:  Chief Complaint  Patient presents with  . Follow-up    Routine follow up, C/O leg pains that come and go would like something for this. Coffee only this morning.     HPI TYREES CHOPIN II presents for follow-up of hypertension elevated cholesterol ceruminosis and bilateral leg pain.  Patient describes the pain in his posterior thighs and calves muscles.  He does come when he is active and moves around.  Tends to improve with rest.  Denies back pain.  Denies specific vein pain.  He had smoked for 30 years but quit 4 years ago.  He never got up to a pack a day.  Last LDL cholesterol was 104 with simvastatin at 40 mg.  Blood pressure is controlled with losartan and HCTZ.  Past Medical History:  Diagnosis Date  . Arthritis   . Diabetes mellitus without complication ([place])   . Hyperlipidemia   . Hypertension     Past Surgical History:  Procedure Laterality Date  . TONSILLECTOMY      Family History  Problem Relation Age of Onset  . Diabetes Mother   . Colon cancer Neg Hx   . Esophageal cancer Neg Hx   . Rectal cancer Neg Hx   . Stomach cancer Neg Hx     Social History   Socioeconomic History  . Marital status: Married    Spouse name: Not on file  . Number of children: Not on file  . Years of education: Not on file  . Highest education level: Not on file  Occupational History  . Not on file  Tobacco Use  . Smoking status: Former Research scientist (life sciences)  . Smokeless tobacco: Never Used  . Tobacco comment: Quit in 2017   Vaping Use  . Vaping Use: Never used  Substance and Sexual Activity  . Alcohol use: Yes    Comment: Socially  . Drug use: Never  . Sexual activity: Not on file  Other Topics Concern  . Not on file  Social History Narrative  . Not on file   Social Determinants of Health   Financial Resource Strain: Not on file  Food  Insecurity: Not on file  Transportation Needs: Not on file  Physical Activity: Not on file  Stress: Not on file  Social Connections: Not on file  Intimate Partner Violence: Not on file    Outpatient Medications Prior to Visit  Medication Sig Dispense Refill  . Continuous Blood Gluc Sensor (FREESTYLE LIBRE 14 DAY SENSOR) MISC 1 Device by Does not apply route every 14 (fourteen) days. 6 each 3  . Dulaglutide (TRULICITY) 1.5 YK/9.9IP SOPN Inject 1.5 mg into the skin once a week. 6 mL 3  . FARXIGA 10 MG TABS tablet TAKE ONE TABLET BY MOUTH DAILY 30 tablet 3  . glucose blood test strip 1 each by Other route 2 (two) times daily. And lancets 2/day 200 each 3  . insulin degludec (TRESIBA FLEXTOUCH) 100 UNIT/ML FlexTouch Pen Inject 40 Units into the skin daily. 15 mL 3  . insulin lispro (HUMALOG KWIKPEN) 100 UNIT/ML KwikPen Inject 0.05-0.1 mLs (5-10 Units total) into the skin 3 (three) times daily with meals. And pen needles 4/day 15 mL 11  . Insulin Pen Needle (PEN NEEDLES) 32G X 4 MM MISC 1 Package by Does not apply route 4 (four) times daily. 360 each  2  . losartan-hydrochlorothiazide (HYZAAR) 100-25 MG tablet TAKE ONE TABLET BY MOUTH DAILY **NO FURTHER REFILLS UNTIL DOCTOR VISIT** 30 tablet 5  . metFORMIN (GLUCOPHAGE-XR) 500 MG 24 hr tablet Take 1 tablet (500 mg total) by mouth 2 (two) times daily. 180 tablet 3  . OVER THE COUNTER MEDICATION Goody's powder two times daily.    . sildenafil (VIAGRA) 100 MG tablet TAKE ONE TABLET BY MOUTH DAILY AS NEEDED FOR ERECTILE DYSFUNCTION 30 tablet 2  . simvastatin (ZOCOR) 40 MG tablet Take 1 tablet (40 mg total) by mouth daily at 6 PM. 90 tablet 3  . carbamide peroxide (DEBROX) 6.5 % OTIC solution Place 5 drops into both ears 2 (two) times daily. (Patient not taking: Reported on 05/03/2020) 15 mL 2   Facility-Administered Medications Prior to Visit  Medication Dose Route Frequency Provider Last Rate Last Admin  . 0.9 %  sodium chloride infusion  500 mL  Intravenous Once Irene Shipper, MD        No Known Allergies  ROS Review of Systems  Constitutional: Negative.   HENT: Negative.   Eyes: Negative for photophobia and visual disturbance.  Respiratory: Negative.   Cardiovascular: Negative.   Gastrointestinal: Negative.   Endocrine: Negative for polyphagia and polyuria.  Genitourinary: Negative for difficulty urinating, frequency and urgency.  Musculoskeletal: Positive for myalgias. Negative for arthralgias and back pain.  Skin: Negative for color change and pallor.  Neurological: Negative for speech difficulty and weakness.  Hematological: Does not bruise/bleed easily.  Psychiatric/Behavioral: Negative.       Objective:    Physical Exam Vitals and nursing note reviewed.  Constitutional:      General: He is not in acute distress.    Appearance: Normal appearance. He is not ill-appearing, toxic-appearing or diaphoretic.  HENT:     Head: Normocephalic and atraumatic.     Right Ear: Tympanic membrane, ear canal and external ear normal.     Left Ear: Tympanic membrane, ear canal and external ear normal.     Mouth/Throat:     Mouth: Mucous membranes are moist.     Pharynx: Oropharynx is clear. No oropharyngeal exudate or posterior oropharyngeal erythema.  Eyes:     General: No scleral icterus.       Right eye: No discharge.        Left eye: No discharge.     Conjunctiva/sclera: Conjunctivae normal.     Pupils: Pupils are equal, round, and reactive to light.  Neck:     Vascular: No carotid bruit.  Cardiovascular:     Rate and Rhythm: Normal rate and regular rhythm.     Pulses:          Carotid pulses are 1+ on the right side and 1+ on the left side.      Dorsalis pedis pulses are 1+ on the right side and 1+ on the left side.       Posterior tibial pulses are 1+ on the right side and 1+ on the left side.  Pulmonary:     Effort: Pulmonary effort is normal.     Breath sounds: Normal breath sounds.  Abdominal:     General:  Bowel sounds are normal. There is no distension.     Palpations: There is no mass.     Tenderness: There is no abdominal tenderness. There is no guarding or rebound.     Hernia: No hernia is present.  Musculoskeletal:     Cervical back: No rigidity or tenderness.  Right hip: Normal range of motion.     Left hip: Normal range of motion.     Right knee: No swelling. Normal range of motion. No tenderness.     Left knee: No swelling. Normal range of motion. No tenderness.     Right lower leg: No deformity, lacerations, tenderness or bony tenderness. No edema.     Left lower leg: Tenderness present. No deformity, lacerations or bony tenderness. No edema.       Legs:  Lymphadenopathy:     Cervical: No cervical adenopathy.  Skin:    General: Skin is warm and dry.  Neurological:     Mental Status: He is alert and oriented to person, place, and time.  Psychiatric:        Mood and Affect: Mood normal.        Behavior: Behavior normal.     BP 122/80   Pulse 95   Temp 97.8 F (36.6 C) (Temporal)   Ht 5\' 9"  (1.753 m)   Wt 172 lb 12.8 oz (78.4 kg)   SpO2 97%   BMI 25.52 kg/m  Wt Readings from Last 3 Encounters:  05/03/20 172 lb 12.8 oz (78.4 kg)  03/11/20 176 lb 9.6 oz (80.1 kg)  11/20/19 178 lb 9.6 oz (81 kg)     Health Maintenance Due  Topic Date Due  . Hepatitis C Screening  Never done  . PNEUMOCOCCAL POLYSACCHARIDE VACCINE AGE 11-64 HIGH RISK  Never done  . OPHTHALMOLOGY EXAM  Never done  . HIV Screening  Never done  . TETANUS/TDAP  Never done    There are no preventive care reminders to display for this patient.  Lab Results  Component Value Date   TSH 1.54 10/08/2017   Lab Results  Component Value Date   WBC 9.0 05/03/2020   HGB 15.5 05/03/2020   HCT 45.9 05/03/2020   MCV 89.1 05/03/2020   PLT 384.0 05/03/2020   Lab Results  Component Value Date   NA 137 05/03/2020   K 4.0 05/03/2020   CO2 26 05/03/2020   GLUCOSE 112 (H) 05/03/2020   BUN 24 (H)  05/03/2020   CREATININE 0.82 05/03/2020   BILITOT 0.7 05/03/2020   ALKPHOS 77 05/03/2020   AST 15 05/03/2020   ALT 16 05/03/2020   PROT 6.7 05/03/2020   ALBUMIN 4.2 05/03/2020   CALCIUM 9.2 05/03/2020   GFR 96.53 05/03/2020   Lab Results  Component Value Date   CHOL 154 05/03/2020   Lab Results  Component Value Date   HDL 47.10 05/03/2020   Lab Results  Component Value Date   LDLCALC 90 05/03/2020   Lab Results  Component Value Date   TRIG 86.0 05/03/2020   Lab Results  Component Value Date   CHOLHDL 3 05/03/2020   Lab Results  Component Value Date   HGBA1C 7.9 (A) 03/11/2020      Assessment & Plan:   Problem List Items Addressed This Visit      Cardiovascular and Mediastinum   Essential hypertension - Primary   Relevant Medications   rosuvastatin (CRESTOR) 20 MG tablet   Other Relevant Orders   CBC (Completed)   Comprehensive metabolic panel (Completed)   Urinalysis, Routine w reflex microscopic (Completed)   Microalbumin / creatinine urine ratio (Completed)     Endocrine   Diabetes mellitus ([facility])   Relevant Medications   rosuvastatin (CRESTOR) 20 MG tablet   Other Relevant Orders   Ambulatory referral to Ophthalmology     Nervous  and Auditory   Excessive cerumen in both ear canals   Relevant Medications   carbamide peroxide (DEBROX) 6.5 % OTIC solution     Other   Elevated LDL cholesterol level   Relevant Medications   rosuvastatin (CRESTOR) 20 MG tablet   Other Relevant Orders   Comprehensive metabolic panel (Completed)   Lipid panel (Completed)   Healthcare maintenance   Relevant Orders   PSA (Completed)    Other Visit Diagnoses    Pain in both lower extremities       Relevant Medications   ibuprofen (IBU) 800 MG tablet   Other Relevant Orders   VAS [place] ABI WITH/WO TBI      Meds ordered this encounter  Medications  . ibuprofen (IBU) 800 MG tablet    Sig: Take 1 tablet (800 mg total) by mouth every 8 (eight) hours as needed.     Dispense:  30 tablet    Refill:  0  . carbamide peroxide (DEBROX) 6.5 % OTIC solution    Sig: Place 5 drops into both ears 2 (two) times daily.    Dispense:  15 mL    Refill:  3  . rosuvastatin (CRESTOR) 20 MG tablet    Sig: Take 1 tablet (20 mg total) by mouth daily.    Dispense:  90 tablet    Refill:  3    Stop simvastatin    Follow-up: Return in about 6 months (around 11/03/2020).   Would like to see the LDL lower.  May switch statins.  Blood pressure is well controlled.  He will continue Debrox drops.  He will use ibuprofen infrequently.  We will start with ABIs. Libby Maw, MD   5/3 addendum: Ldl too high. Have discontinued simvastatin.  Starting patient on rosuvastatin 20 mg daily.

## 2020-05-17 ENCOUNTER — Other Ambulatory Visit: Payer: Self-pay | Admitting: Endocrinology

## 2020-05-17 DIAGNOSIS — E1139 Type 2 diabetes mellitus with other diabetic ophthalmic complication: Secondary | ICD-10-CM

## 2020-05-17 DIAGNOSIS — Z794 Long term (current) use of insulin: Secondary | ICD-10-CM

## 2020-05-23 ENCOUNTER — Ambulatory Visit (HOSPITAL_COMMUNITY): Payer: Managed Care, Other (non HMO)

## 2020-05-31 ENCOUNTER — Emergency Department (HOSPITAL_COMMUNITY): Payer: Managed Care, Other (non HMO)

## 2020-05-31 ENCOUNTER — Inpatient Hospital Stay (HOSPITAL_COMMUNITY)
Admission: EM | Admit: 2020-05-31 | Discharge: 2020-06-02 | DRG: 063 | Disposition: A | Discharge status: Home / Self Care | Payer: Managed Care, Other (non HMO) | Attending: Neurology | Admitting: Neurology

## 2020-05-31 DIAGNOSIS — Z79899 Other long term (current) drug therapy: Secondary | ICD-10-CM | POA: Diagnosis not present

## 2020-05-31 DIAGNOSIS — I255 Ischemic cardiomyopathy: Secondary | ICD-10-CM | POA: Diagnosis present

## 2020-05-31 DIAGNOSIS — Z6825 Body mass index (BMI) 25.0-25.9, adult: Secondary | ICD-10-CM

## 2020-05-31 DIAGNOSIS — G8324 Monoplegia of upper limb affecting left nondominant side: Secondary | ICD-10-CM | POA: Diagnosis present

## 2020-05-31 DIAGNOSIS — I639 Cerebral infarction, unspecified: Secondary | ICD-10-CM | POA: Diagnosis present

## 2020-05-31 DIAGNOSIS — R29701 NIHSS score 1: Secondary | ICD-10-CM | POA: Diagnosis present

## 2020-05-31 DIAGNOSIS — I10 Essential (primary) hypertension: Secondary | ICD-10-CM | POA: Diagnosis present

## 2020-05-31 DIAGNOSIS — I6521 Occlusion and stenosis of right carotid artery: Secondary | ICD-10-CM | POA: Diagnosis present

## 2020-05-31 DIAGNOSIS — T45615A Adverse effect of thrombolytic drugs, initial encounter: Secondary | ICD-10-CM | POA: Diagnosis not present

## 2020-05-31 DIAGNOSIS — Z7982 Long term (current) use of aspirin: Secondary | ICD-10-CM | POA: Diagnosis not present

## 2020-05-31 DIAGNOSIS — E1165 Type 2 diabetes mellitus with hyperglycemia: Secondary | ICD-10-CM | POA: Diagnosis present

## 2020-05-31 DIAGNOSIS — I6389 Other cerebral infarction: Secondary | ICD-10-CM | POA: Diagnosis not present

## 2020-05-31 DIAGNOSIS — R791 Abnormal coagulation profile: Secondary | ICD-10-CM | POA: Diagnosis not present

## 2020-05-31 DIAGNOSIS — Z7901 Long term (current) use of anticoagulants: Secondary | ICD-10-CM

## 2020-05-31 DIAGNOSIS — Z7984 Long term (current) use of oral hypoglycemic drugs: Secondary | ICD-10-CM | POA: Diagnosis not present

## 2020-05-31 DIAGNOSIS — R202 Paresthesia of skin: Secondary | ICD-10-CM | POA: Diagnosis present

## 2020-05-31 DIAGNOSIS — E785 Hyperlipidemia, unspecified: Secondary | ICD-10-CM | POA: Diagnosis present

## 2020-05-31 DIAGNOSIS — Z794 Long term (current) use of insulin: Secondary | ICD-10-CM

## 2020-05-31 DIAGNOSIS — R9082 White matter disease, unspecified: Secondary | ICD-10-CM | POA: Diagnosis present

## 2020-05-31 DIAGNOSIS — Z833 Family history of diabetes mellitus: Secondary | ICD-10-CM | POA: Diagnosis not present

## 2020-05-31 DIAGNOSIS — E669 Obesity, unspecified: Secondary | ICD-10-CM | POA: Diagnosis present

## 2020-05-31 DIAGNOSIS — Z87891 Personal history of nicotine dependence: Secondary | ICD-10-CM

## 2020-05-31 DIAGNOSIS — Z8249 Family history of ischemic heart disease and other diseases of the circulatory system: Secondary | ICD-10-CM

## 2020-05-31 DIAGNOSIS — Z8673 Personal history of transient ischemic attack (TIA), and cerebral infarction without residual deficits: Secondary | ICD-10-CM

## 2020-05-31 DIAGNOSIS — I63411 Cerebral infarction due to embolism of right middle cerebral artery: Principal | ICD-10-CM | POA: Diagnosis present

## 2020-05-31 DIAGNOSIS — E7800 Pure hypercholesterolemia, unspecified: Secondary | ICD-10-CM | POA: Diagnosis not present

## 2020-05-31 DIAGNOSIS — Z7289 Other problems related to lifestyle: Secondary | ICD-10-CM | POA: Diagnosis not present

## 2020-05-31 DIAGNOSIS — R9439 Abnormal result of other cardiovascular function study: Secondary | ICD-10-CM | POA: Diagnosis not present

## 2020-05-31 DIAGNOSIS — D75839 Thrombocytosis, unspecified: Secondary | ICD-10-CM | POA: Diagnosis not present

## 2020-05-31 DIAGNOSIS — Z20822 Contact with and (suspected) exposure to covid-19: Secondary | ICD-10-CM | POA: Diagnosis present

## 2020-05-31 DIAGNOSIS — I2510 Atherosclerotic heart disease of native coronary artery without angina pectoris: Secondary | ICD-10-CM | POA: Diagnosis present

## 2020-05-31 LAB — COMPREHENSIVE METABOLIC PANEL
ALT: 19 U/L (ref 0–44)
AST: 19 U/L (ref 15–41)
Albumin: 3.5 g/dL (ref 3.5–5.0)
Alkaline Phosphatase: 69 U/L (ref 38–126)
Anion gap: 9 (ref 5–15)
BUN: 17 mg/dL (ref 6–20)
CO2: 26 mmol/L (ref 22–32)
Calcium: 8.8 mg/dL — ABNORMAL LOW (ref 8.9–10.3)
Chloride: 102 mmol/L (ref 98–111)
Creatinine, Ser: 0.78 mg/dL (ref 0.61–1.24)
GFR, Estimated: >60 mL/min (ref >60)
Glucose, Bld: 101 mg/dL — ABNORMAL HIGH (ref 70–99)
Potassium: 3.7 mmol/L (ref 3.5–5.1)
Sodium: 137 mmol/L (ref 135–145)
Total Bilirubin: 0.7 mg/dL (ref 0.3–1.2)
Total Protein: 6.5 g/dL (ref 6.5–8.1)

## 2020-05-31 LAB — CBC
HCT: 43.4 % (ref 39.0–52.0)
Hemoglobin: 14.8 g/dL (ref 13.0–17.0)
MCH: 30.3 pg (ref 26.0–34.0)
MCHC: 34.1 g/dL (ref 30.0–36.0)
MCV: 88.8 fL (ref 80.0–100.0)
Platelets: 435 10*3/uL — ABNORMAL HIGH (ref 150–400)
RBC: 4.89 MIL/uL (ref 4.22–5.81)
RDW: 13 % (ref 11.5–15.5)
WBC: 9.5 10*3/uL (ref 4.0–10.5)
nRBC: 0 % (ref 0.0–0.2)

## 2020-05-31 LAB — I-STAT CHEM 8, ED
BUN: 18 mg/dL (ref 6–20)
Calcium, Ion: 1.18 mmol/L (ref 1.15–1.40)
Chloride: 103 mmol/L (ref 98–111)
Creatinine, Ser: 0.7 mg/dL (ref 0.61–1.24)
Glucose, Bld: 94 mg/dL (ref 70–99)
HCT: 43 % (ref 39.0–52.0)
Hemoglobin: 14.6 g/dL (ref 13.0–17.0)
Potassium: 3.7 mmol/L (ref 3.5–5.1)
Sodium: 139 mmol/L (ref 135–145)
TCO2: 25 mmol/L (ref 22–32)

## 2020-05-31 LAB — URINALYSIS, COMPLETE (UACMP) WITH MICROSCOPIC
Bacteria, UA: NONE SEEN
Bilirubin Urine: NEGATIVE
Glucose, UA: >=500 mg/dL — AB
Ketones, ur: 5 mg/dL — AB
Leukocytes,Ua: NEGATIVE
Nitrite: NEGATIVE
Protein, ur: NEGATIVE mg/dL
Specific Gravity, Urine: 1.037 — ABNORMAL HIGH (ref 1.005–1.030)
pH: 6 (ref 5.0–8.0)

## 2020-05-31 LAB — RAPID URINE DRUG SCREEN, HOSP PERFORMED
Amphetamines: NOT DETECTED
Barbiturates: NOT DETECTED
Benzodiazepines: NOT DETECTED
Cocaine: NOT DETECTED
Opiates: NOT DETECTED
Tetrahydrocannabinol: NOT DETECTED

## 2020-05-31 LAB — DIFFERENTIAL
Abs Immature Granulocytes: 0.03 10*3/uL (ref 0.00–0.07)
Basophils Absolute: 0.1 10*3/uL (ref 0.0–0.1)
Basophils Relative: 1 %
Eosinophils Absolute: 0.3 10*3/uL (ref 0.0–0.5)
Eosinophils Relative: 3 %
Immature Granulocytes: 0 %
Lymphocytes Relative: 30 %
Lymphs Abs: 2.9 10*3/uL (ref 0.7–4.0)
Monocytes Absolute: 0.7 10*3/uL (ref 0.1–1.0)
Monocytes Relative: 8 %
Neutro Abs: 5.5 10*3/uL (ref 1.7–7.7)
Neutrophils Relative %: 58 %

## 2020-05-31 LAB — PROTIME-INR
INR: 1 (ref 0.8–1.2)
Prothrombin Time: 13.3 seconds (ref 11.4–15.2)

## 2020-05-31 LAB — APTT: aPTT: 27 seconds (ref 24–36)

## 2020-05-31 LAB — CBG MONITORING, ED: Glucose-Capillary: 96 mg/dL (ref 70–99)

## 2020-05-31 IMAGING — CT CT ANGIO HEAD
2 of 7 series · 8 of 33 positions shown · IV contrast (OMNI 350)
Comparison: None.

CLINICAL DATA: Code stroke follow-up, abnormal MRI

EXAM:
CT ANGIOGRAPHY HEAD AND NECK
TECHNIQUE: Multidetector CT imaging of the head and neck was performed using
the standard protocol during bolus administration of intravenous
contrast. Multiplanar CT image reconstructions and MIPs were
obtained to evaluate the vascular anatomy. Carotid stenosis
measurements (when applicable) are obtained utilizing NASCET
criteria, using the distal internal carotid diameter as the
denominator.
CONTRAST:  50mL OMNIPAQUE IOHEXOL 350 MG/ML SOLN

[Series 5: cta neck · axial · 0.56mm/px · z∈[-271,-153]mm · 2 of 177 slices shown]
[im 59/177  soft-tissue]
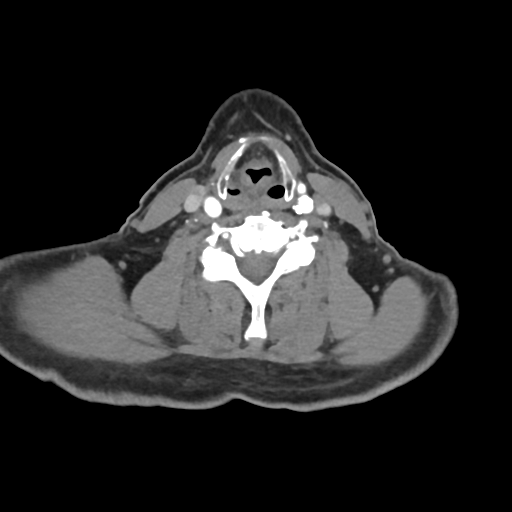
[im 118/177  soft-tissue]
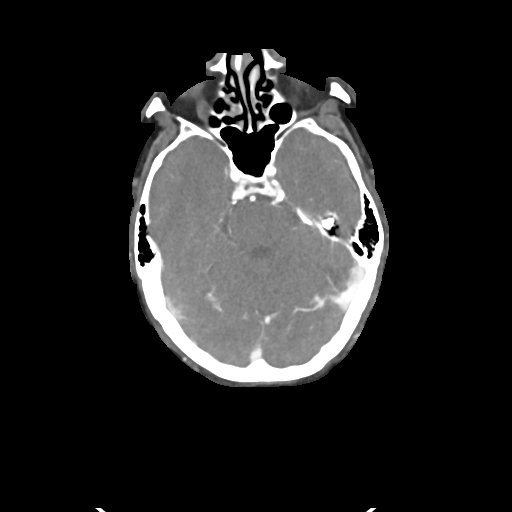

[Series 7: cta neck axial · axial · 0.39mm/px · z∈[-338,-86]mm · 6 of 354 slices shown]
[im 51/354  soft-tissue]
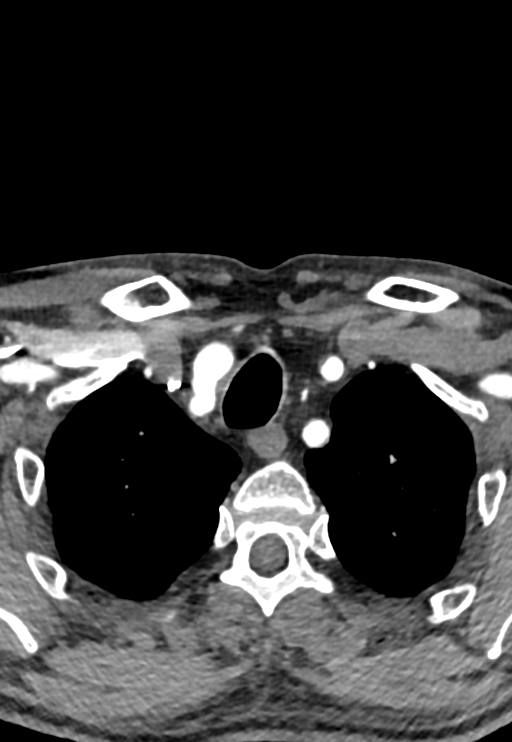
[im 101/354  bone]
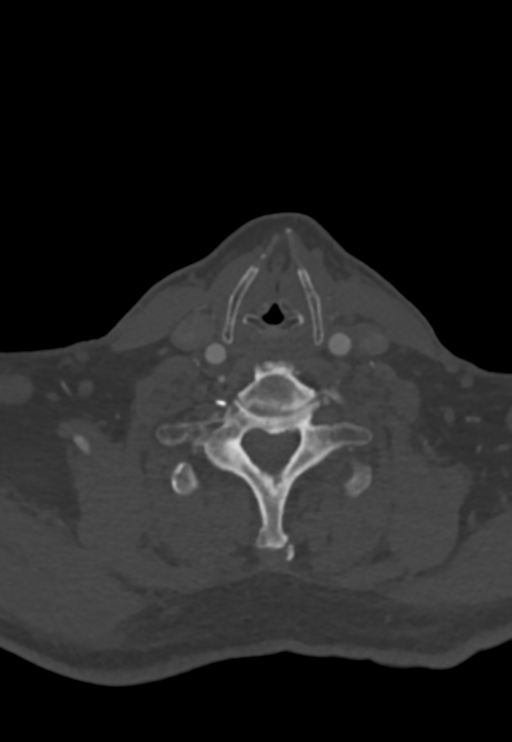
[im 152/354  soft-tissue]
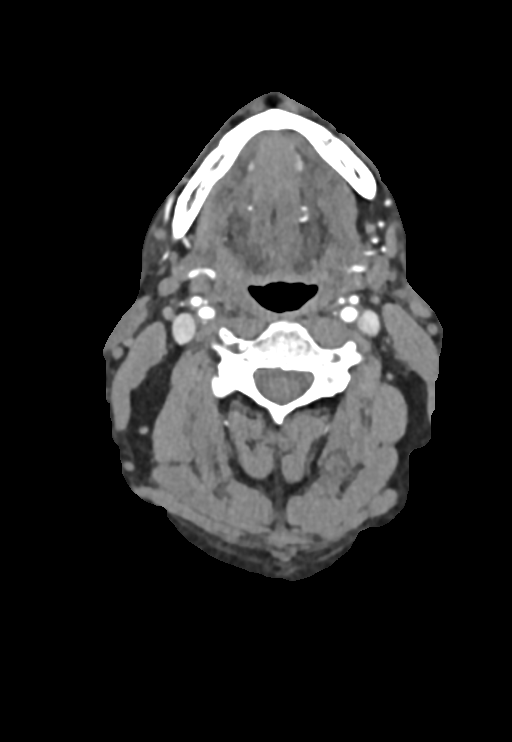
[im 202/354  bone]
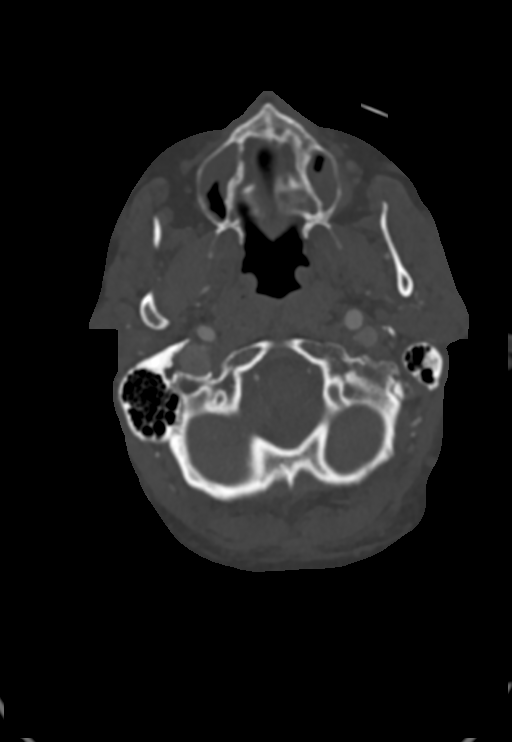
[im 253/354  soft-tissue]
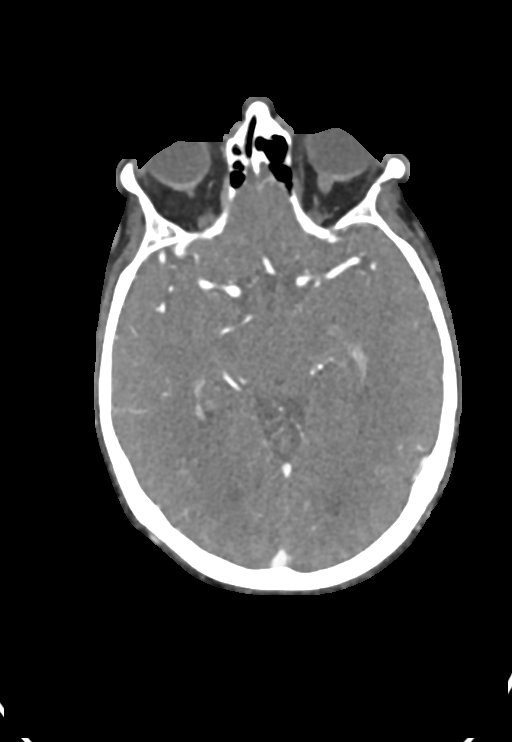
[im 303/354  bone]
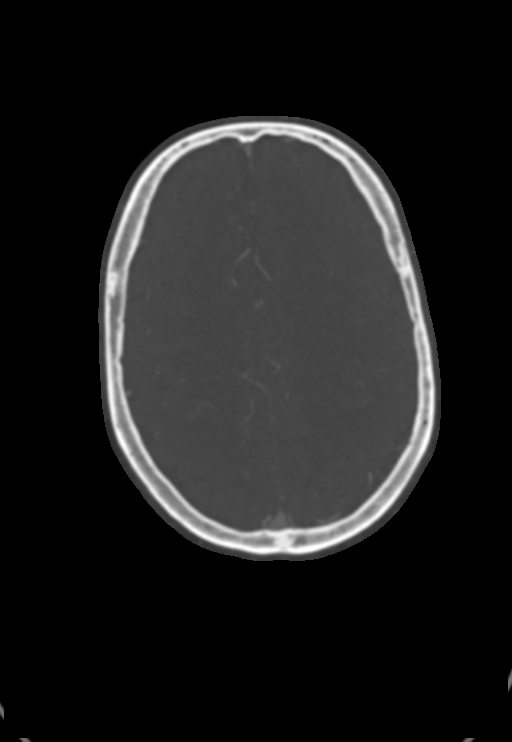

[8 of 33 positions shown; findings below may reference images not displayed]

FINDINGS: CTA NECK FINDINGS

Aortic arch: Mixed plaque along the aortic arch and patent great
vessel origins. There is eccentric noncalcified plaque along the
proximal left subclavian causing less than 50% stenosis.

Right carotid system: Patent. Mixed plaque at the proximal ICA
causes less than 50% stenosis.

Left carotid system: Patent. Mixed but primarily calcified plaque at
the proximal ICA causes less than 50% stenosis.

Vertebral arteries: Patent and codominant.  No stenosis.

Skeleton: Degenerative changes of the cervical spine.

Other neck: Unremarkable.

Upper chest: No apical lung mass.

Review of the MIP images confirms the above findings

CTA HEAD FINDINGS

Anterior circulation: Intracranial internal carotid arteries are
patent with mixed plaque causing up to moderate stenosis on the left
at the level of the distal cavernous segment. Right M1 and proximal
M2 MCA branches are patent. There is a probable attenuated right M4
branch in proximity to the area of infarction; difficult to separate
from venous enhancement due to contrast timing. Left middle and both
anterior cerebral arteries are patent. Anterior communicating artery
is present.

Posterior circulation: Intracranial vertebral arteries are patent.
Left vertebral artery becomes diminutive after PICA origin. There is
a small caliber proximal basilar artery. More distal basilar artery
is greater in caliber due to presence of a persistent left
trigeminal artery. Major cerebellar artery origins are patent.
Posterior cerebral arteries are patent. A left posterior
communicating artery is present with fetal origin of the left PCA.
Probable small right posterior communicating artery.

Venous sinuses: As permitted by contrast timing, patent.

Review of the MIP images confirms the above findings
IMPRESSION: No large vessel occlusion and neck. There is no proximal
intracranial vessel occlusion. Probable attenuated right M4 MCA
branch in proximity area of infarction.

Plaque at the right greater than left proximal internal carotids
causes less than 50% stenosis.

Incidental note is made of a persistent left trigeminal artery.

## 2020-05-31 IMAGING — MR MR HEAD W/O CM
7 series · 48 of 48 positions shown · non-contrast
Comparison: None.

CLINICAL DATA: Left-sided hand weakness

EXAM:
MRI HEAD WITHOUT CONTRAST
TECHNIQUE: Multiplanar, multiecho pulse sequences of the brain and surrounding
structures were obtained without intravenous contrast.

[Series 5: DWI · axial · 3.0mm · 0.88mm/px · z∈[-80,+65]mm · 13 of 100 slices shown (1 of 4)]
[im 1/100]
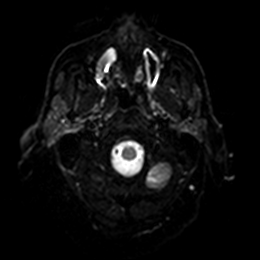
[im 9/100]
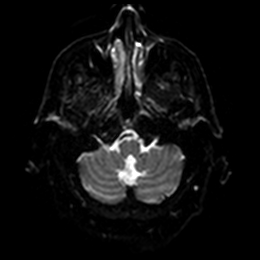
[im 17/100]
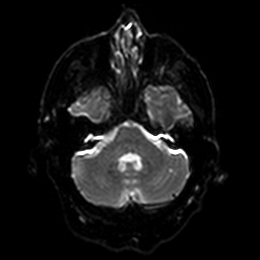
[im 25/100]
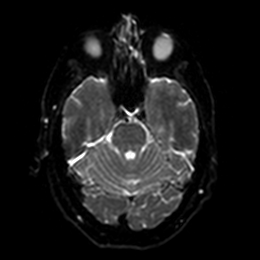
[im 34/100]
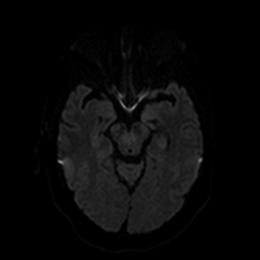
[im 42/100]
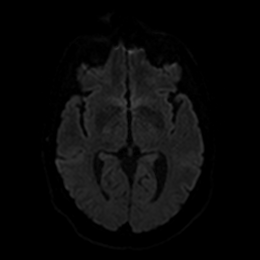
[im 50/100]
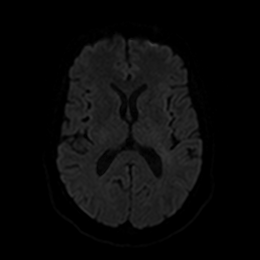
[im 58/100]
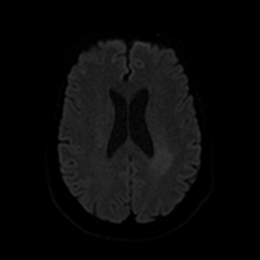
[im 67/100]
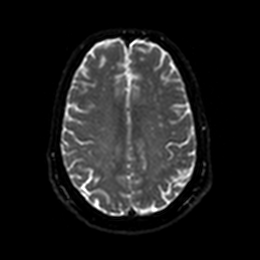
[im 75/100]
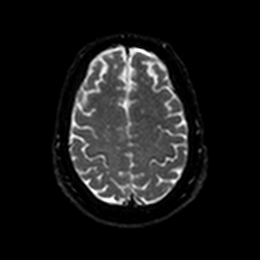
[im 83/100]
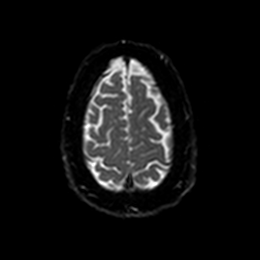
[im 91/100]
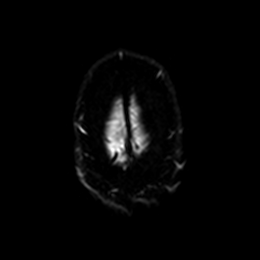
[im 100/100]
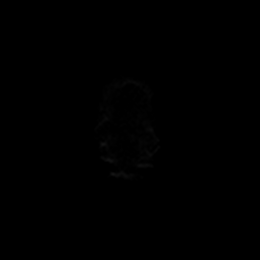

[Series 6: DWI · axial · 3.0mm · 0.88mm/px · z∈[-80,+65]mm · 6 of 49 slices shown (2 of 4)]
[im 1/49]
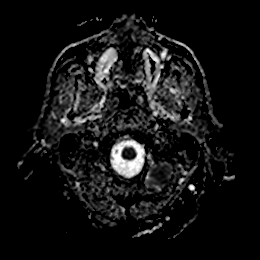
[im 10/49]
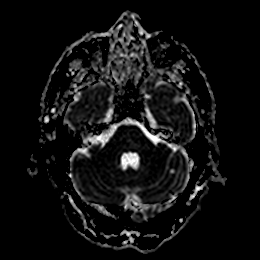
[im 20/49]
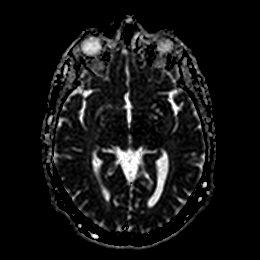
[im 29/49]
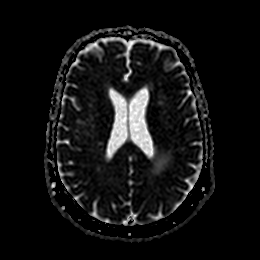
[im 39/49]
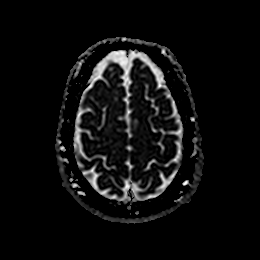
[im 49/49]
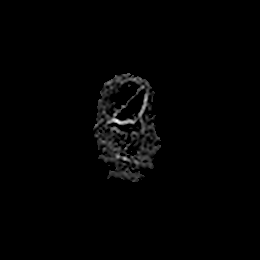

[Series 7: DWI · coronal · 4.0mm · 0.88mm/px · 8 of 64 slices shown (3 of 4)]
[im 1/64]
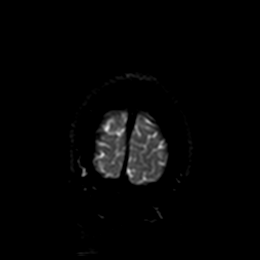
[im 10/64]
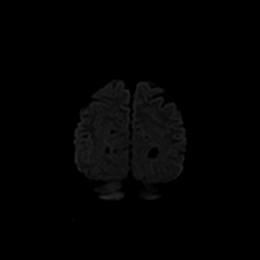
[im 19/64]
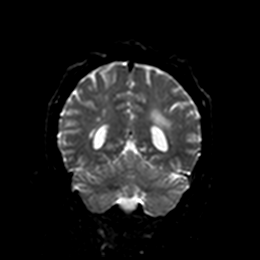
[im 28/64]
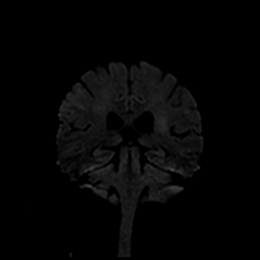
[im 37/64]
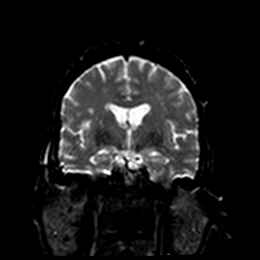
[im 46/64]
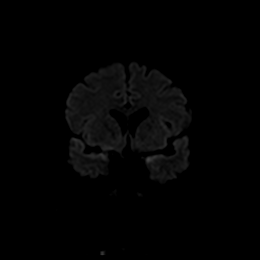
[im 55/64]
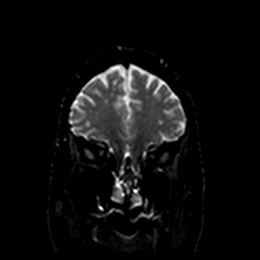
[im 64/64]
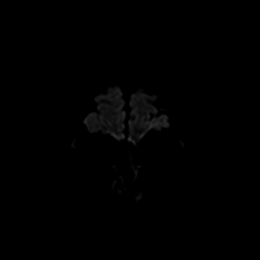

[Series 8: DWI · coronal · 4.0mm · 0.88mm/px · 4 of 32 slices shown (4 of 4)]
[im 1/32]
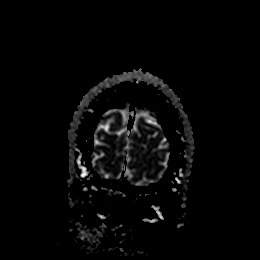
[im 11/32]
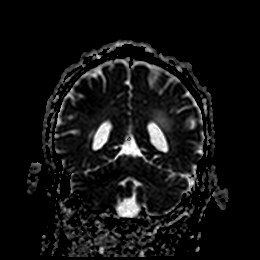
[im 21/32]
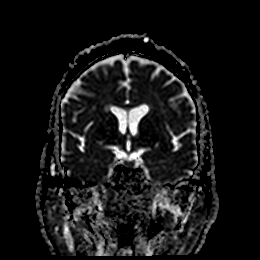
[im 32/32]
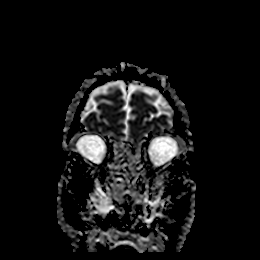

[Series 9: FLAIR · axial · 5.0mm · 0.45mm/px · z∈[-76,+66]mm · 3 of 25 slices shown]
[im 1/25]
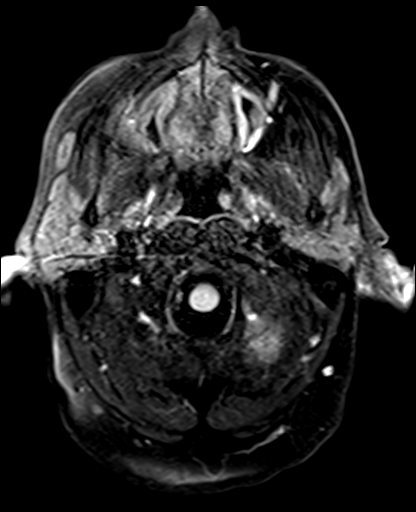
[im 13/25]
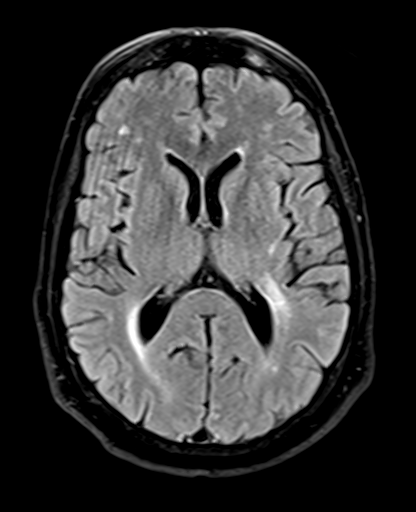
[im 25/25]
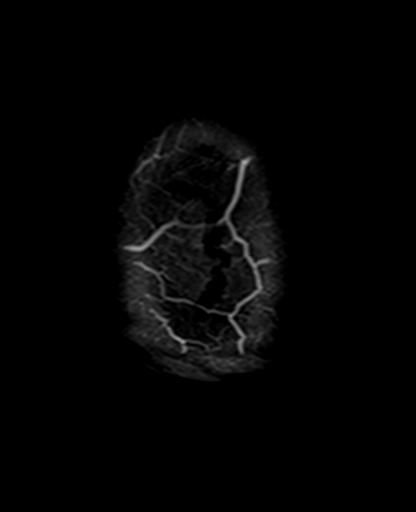

[Series 11: pha_images · axial · 3.0mm · 0.90mm/px · z∈[-92,+76]mm · 7 of 58 slices shown]
[im 1/58]
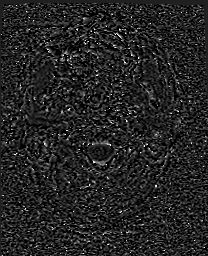
[im 10/58]
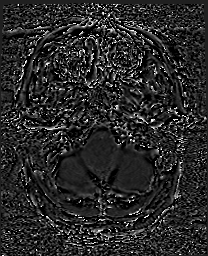
[im 20/58]
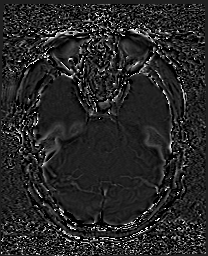
[im 29/58]
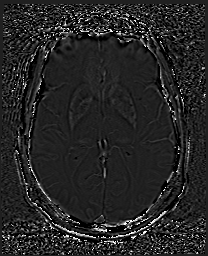
[im 39/58]
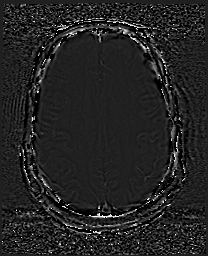
[im 48/58]
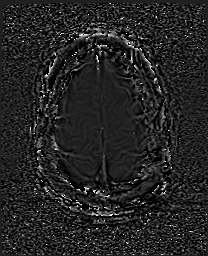
[im 58/58]
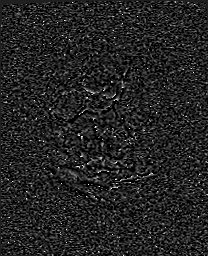

[Series 12: swi_images · axial · 3.0mm · 0.90mm/px · z∈[-92,+82]mm · 7 of 60 slices shown]
[im 1/60]
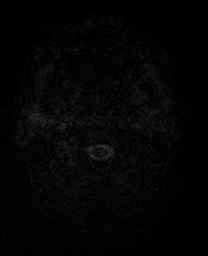
[im 10/60]
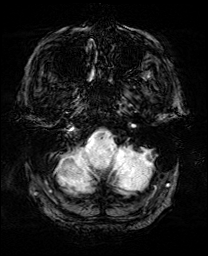
[im 20/60]
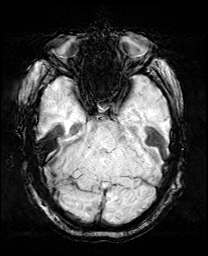
[im 30/60]
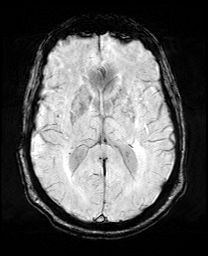
[im 40/60]
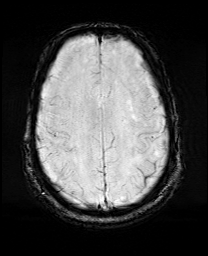
[im 50/60]
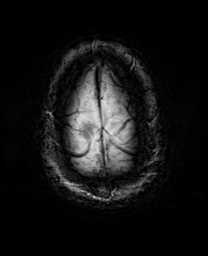
[im 60/60]
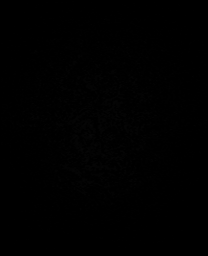

[48 of 48 positions shown; findings below may reference images not displayed]

FINDINGS: DWI, SWI, and T2 FLAIR sequences were obtained as part of a stroke
protocol.

There is a moderate size area of right frontoparietal diffusion
hyperintensity with mild ADC isointensity. There is no substantial
corresponding T2 FLAIR hyperintensity. This involves the
perirolandic region. A punctate focus of susceptibility is present
in this region may reflect microhemorrhage or mineralization.

Additional patchy foci of T2 hyperintensity in the supratentorial
white matter nonspecific but may reflect mild to moderate chronic
microvascular ischemic changes. This includes a more confluent area
along the posterior left lateral ventricle.

No mass effect. No hydrocephalus or extra-axial collection. In
addition to above, there is also a punctate area susceptibility
along the right lateral aspect of the midbrain also reflecting
chronic microhemorrhage or mineralization.
IMPRESSION: Moderate size area of abnormal right frontoparietal diffusion
involving the perirolandic region without significant corresponding
T2 FLAIR hyperintensity. This may reflect hyperacute infarction. A
punctate focus of susceptibility is region could reflect
age-indeterminate microhemorrhage.

Chronic microvascular ischemic changes. A more confluent area of T2
hyperintensity along the posterior left lateral ventricle likely
represents the same process, but a follow-up study in 1 year is
suggested as an infiltrating neoplasm could have a similar
appearance.

Initial results were discussed by telephone at the time of
interpretation on [DATE] at [DATE] with provider LAVELLE
, who verbally acknowledged these results.

## 2020-05-31 IMAGING — CT CT HEAD CODE STROKE
4 series · 15 of 47 positions shown, 17 images · non-contrast
Comparison: No pertinent prior exams available for comparison.

CLINICAL DATA: Code stroke. Last known normal [Z0], left-sided
weakness.

EXAM:
CT HEAD WITHOUT CONTRAST
TECHNIQUE: Contiguous axial images were obtained from the base of the skull
through the vertex without intravenous contrast.

[Series 2: head wo · axial · 0.45mm/px · z∈[+1379,+1504]mm · 7 of 35 slices shown, 9 images]
[im 5/35  brain]
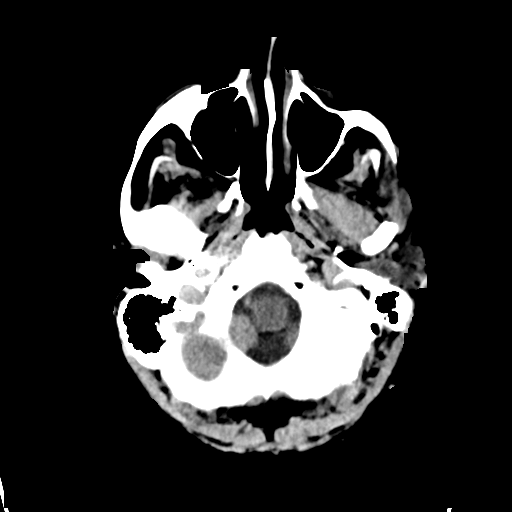
[im 5/35  bone]
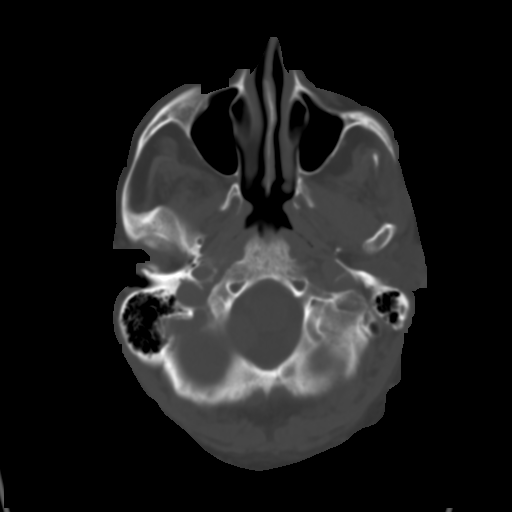
[im 9/35  brain]
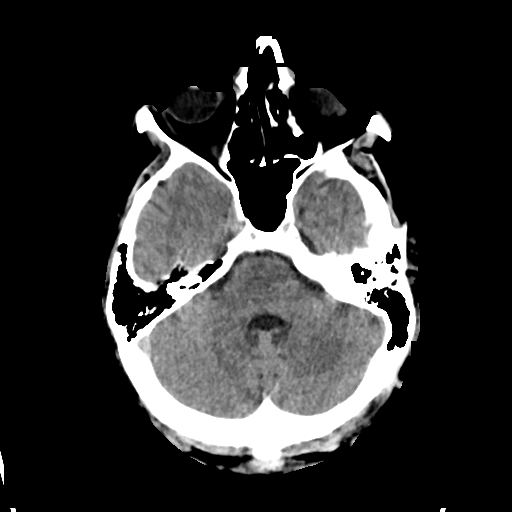
[im 13/35  brain]
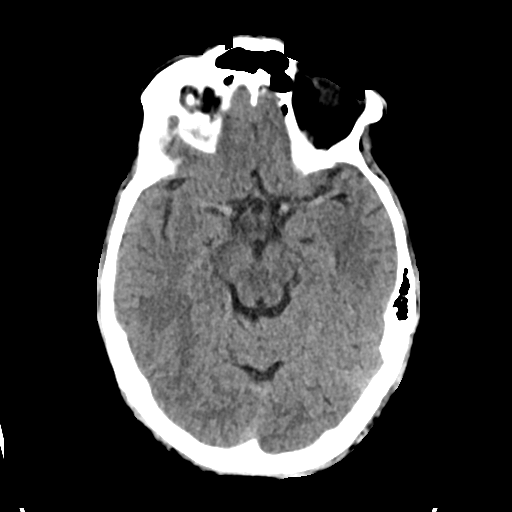
[im 18/35  brain]
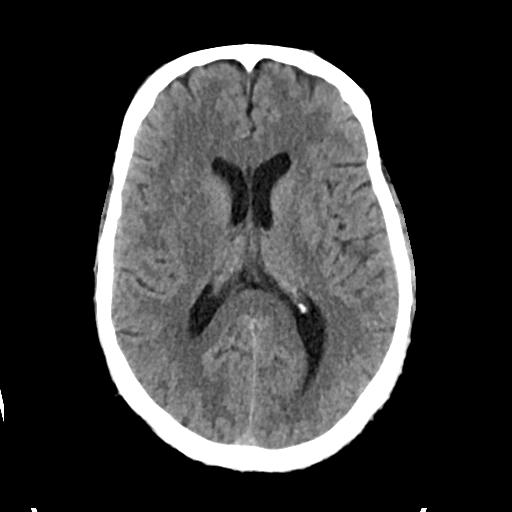
[im 22/35  brain]
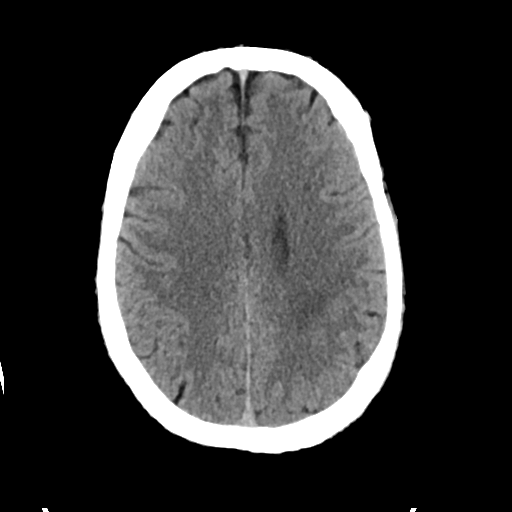
[im 22/35  bone]
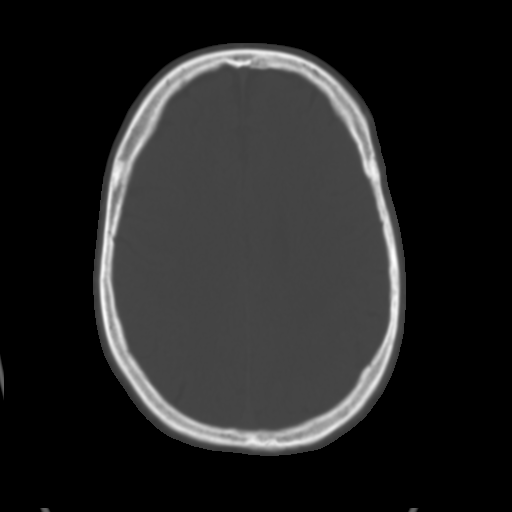
[im 26/35  brain]
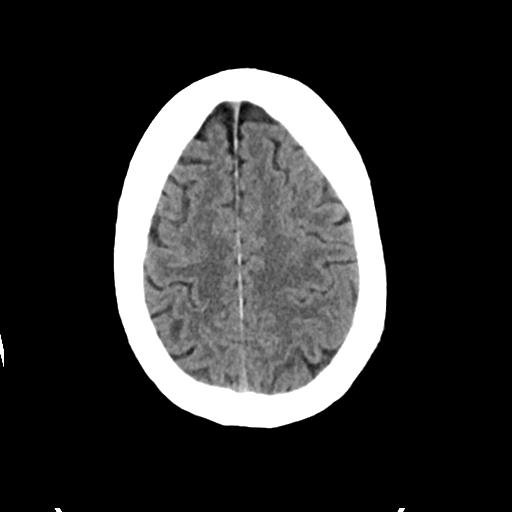
[im 30/35  brain]
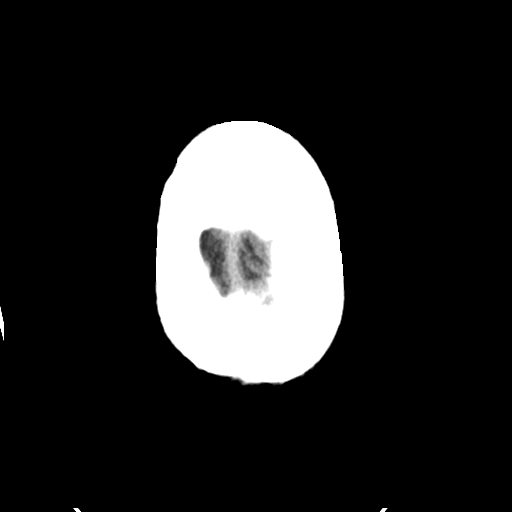

[Series 4: head bone · axial · 0.45mm/px · z∈[+1375,+1393]mm · 2 of 87 slices shown]
[im 9/87  bone]
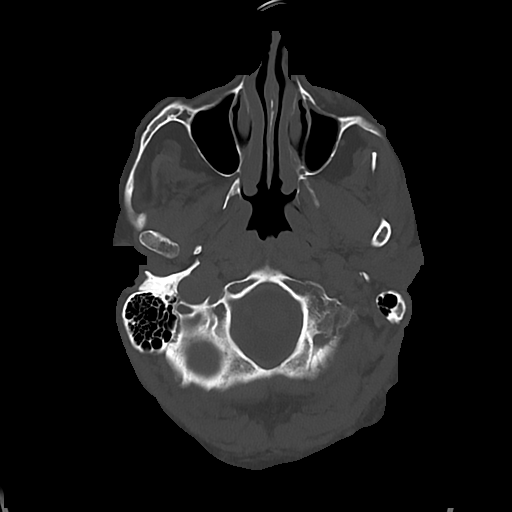
[im 18/87  bone]
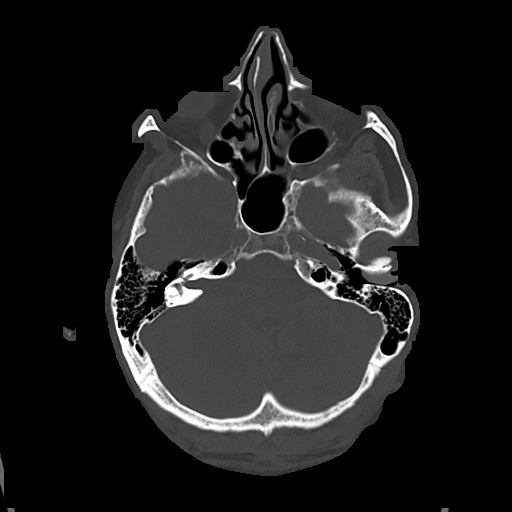

[Series 5: cor soft · coronal · 0.32mm/px · 3 of 71 slices shown]
[im 24/71  brain]
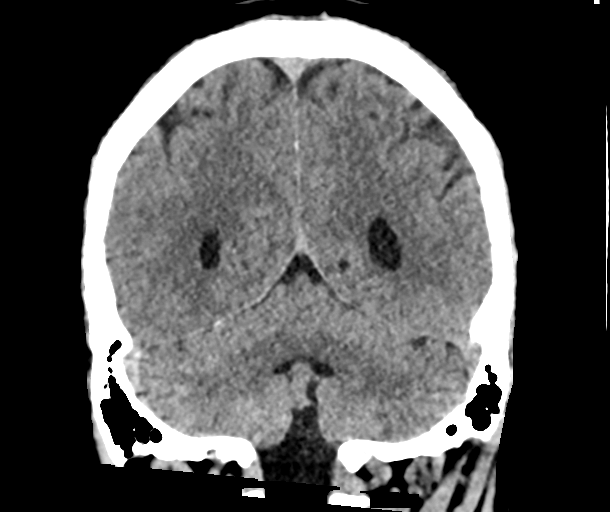
[im 32/71  brain]
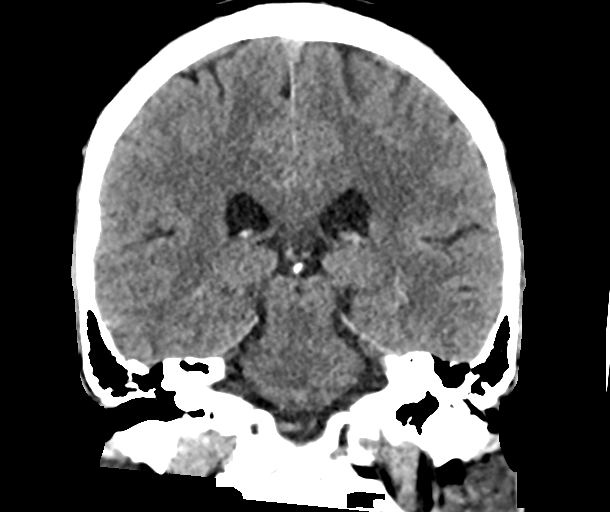
[im 39/71  brain]
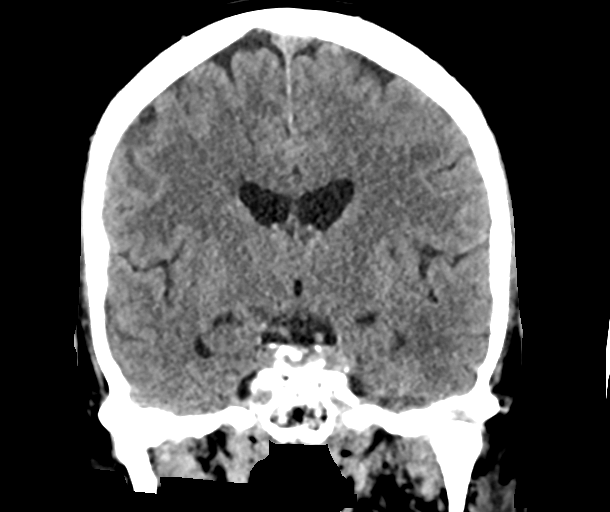

[Series 6: sag soft · sagittal · 0.33mm/px · 3 of 53 slices shown]
[im 18/53  brain]
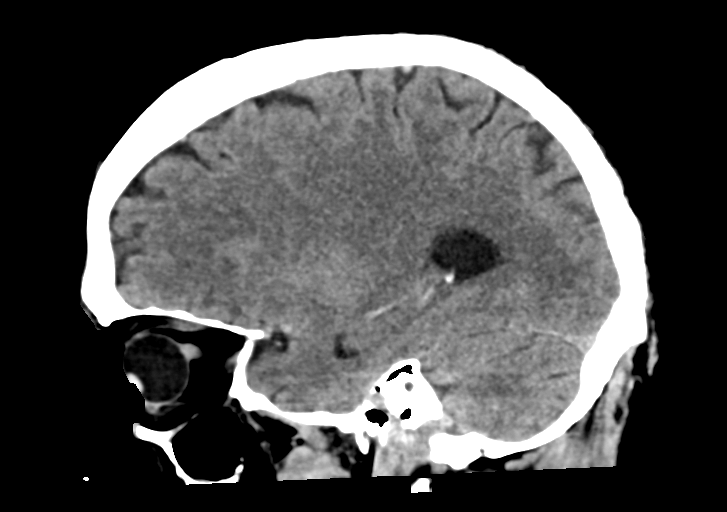
[im 27/53  brain]
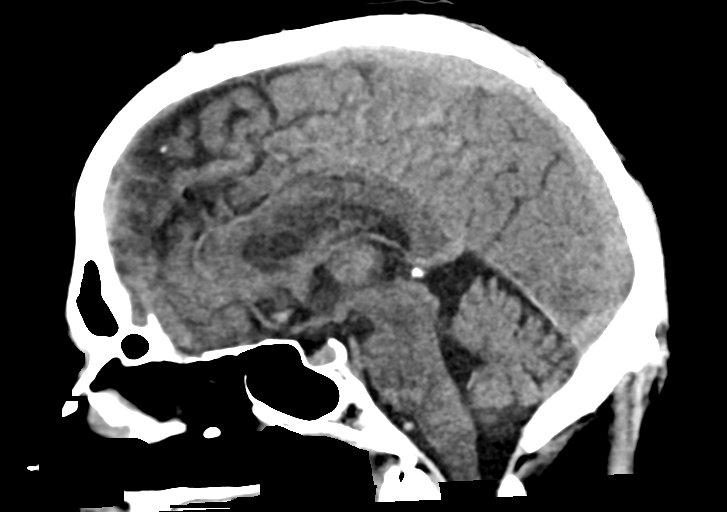
[im 35/53  brain]
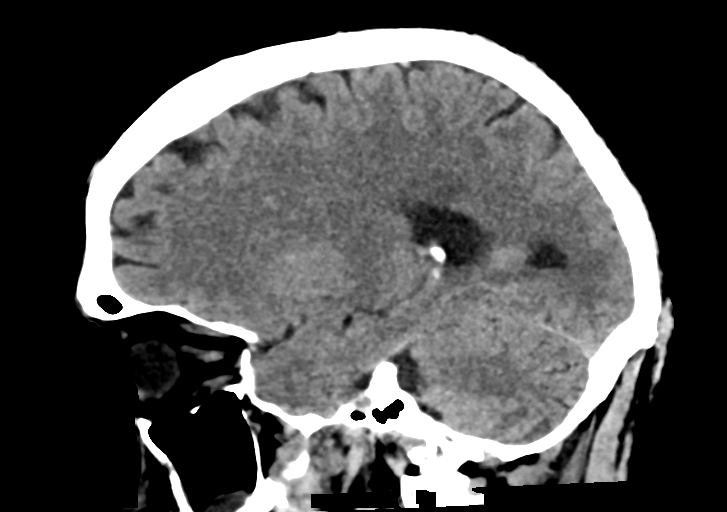

[15 of 47 positions shown; findings below may reference images not displayed]

FINDINGS: Brain:

Cerebral volume is normal for age.

Mild patchy and ill-defined hypoattenuation within the cerebral
white matter, nonspecific but compatible with chronic small vessel
ischemic disease.

Small chronic infarct within the superior left cerebellum (series 5,
image 49) (series 2, image 12).

There is no acute intracranial hemorrhage.

No demarcated cortical infarct.

No extra-axial fluid collection.

No evidence of intracranial mass.

No midline shift.

Vascular: No hyperdense vessel.  Atherosclerotic calcifications

Skull: Normal. Negative for fracture or focal lesion.

Sinuses/Orbits: Visualized orbits show no acute finding. Mild
bilateral ethmoid and maxillary sinus mucosal thickening.

ASPECTS ([place] Stroke Program Early CT Score)

- Ganglionic level infarction (caudate, lentiform nuclei, internal
capsule, insula, M1-M3 cortex): 7

- Supraganglionic infarction (M4-M6 cortex): 3

Total score (0-10 with 10 being normal): 10

These results were communicated to Dr. HIBATULLAH at [DATE] pmon
[DATE]by text page via the [employer] messaging system.
IMPRESSION: No evidence of acute intracranial abnormality.  ASPECTS is 10.

Mild cerebral white matter chronic small vessel ischemic disease.

Small chronic infarct within the superior left cerebellar
hemisphere.

Mild paranasal sinus disease, as described.

## 2020-05-31 MED ORDER — SODIUM CHLORIDE 0.9 % IV SOLN: INTRAVENOUS | Status: DC

## 2020-05-31 MED ORDER — SODIUM CHLORIDE 0.9 % IV SOLN
50.0000 mL | Freq: Once | INTRAVENOUS | Status: AC
  Administered 2020-05-31: 50 mL via INTRAVENOUS

## 2020-05-31 MED ORDER — IOHEXOL 350 MG/ML SOLN
50.0000 mL | Freq: Once | INTRAVENOUS | Status: AC | PRN
  Administered 2020-05-31: 50 mL via INTRAVENOUS

## 2020-05-31 MED ORDER — SENNOSIDES-DOCUSATE SODIUM 8.6-50 MG PO TABS: 1.0000 | ORAL_TABLET | Freq: Every evening | ORAL | Status: DC | PRN

## 2020-05-31 MED ORDER — PANTOPRAZOLE SODIUM 40 MG IV SOLR
40.0000 mg | Freq: Every day | INTRAVENOUS | Status: DC
  Administered 2020-05-31: 40 mg via INTRAVENOUS
  Filled 2020-05-31: qty 40

## 2020-05-31 MED ORDER — STROKE: EARLY STAGES OF RECOVERY BOOK: Freq: Once | Status: DC

## 2020-05-31 MED ORDER — ACETAMINOPHEN 160 MG/5ML PO SOLN: 650.0000 mg | ORAL | Status: DC | PRN

## 2020-05-31 MED ORDER — ACETAMINOPHEN 650 MG RE SUPP: 650.0000 mg | RECTAL | Status: DC | PRN

## 2020-05-31 MED ORDER — SODIUM CHLORIDE 0.9% FLUSH: 3.0000 mL | Freq: Once | INTRAVENOUS | Status: DC

## 2020-05-31 MED ORDER — ALTEPLASE (STROKE) FULL DOSE INFUSION
0.9000 mg/kg | Freq: Once | INTRAVENOUS | Status: AC
  Administered 2020-05-31: 71.4 mg via INTRAVENOUS
  Filled 2020-05-31: qty 100

## 2020-05-31 MED ORDER — ACETAMINOPHEN 325 MG PO TABS
650.0000 mg | ORAL_TABLET | ORAL | Status: DC | PRN
  Administered 2020-06-01 – 2020-06-02 (×2): 650 mg via ORAL
  Filled 2020-05-31 (×2): qty 2

## 2020-05-31 NOTE — ED Triage Notes (Signed)
Code stroke. Arrival time at 1402. Symptom of left hand numbness/tingling. LKN at 1320. Alert and oriented x 4.

## 2020-05-31 NOTE — ED Provider Notes (Signed)
[facility] EMERGENCY DEPARTMENT Provider Note   CSN: [id number] Arrival date & time: 05/31/20  1403  An emergency department physician performed an initial assessment on this suspected stroke patient at 1405.  History Chief Complaint  Patient presents with  . Code Stroke         John [name] is a 60 y.o. male.  HPI  Patient came in as a code stroke.  Arrived around 2:00.  Somewhat difficult to get history.  Has had some numbness in his left hand on and off but developed weakness today.  Reportedly had taken a nap and lay down at around 9 AM.  Woke up around 11 with some weakness.  Unable to pick things up.  Had been taken to CT and an MRI before I had seen him.  Has been screened initially by Dr. Regenia Skeeter however.  No headache.  Patient has been given tPA and states the hand is feeling better.    Past Medical History:  Diagnosis Date  . Arthritis   . Diabetes mellitus without complication ([place])   . Hyperlipidemia   . Hypertension     Patient Active Problem List   Diagnosis Date Noted  . Ischemic stroke ([place]) 05/31/2020  . Excessive cerumen in both ear canals 09/10/2019  . Essential hypertension 09/12/2017  . Elevated LDL cholesterol level 09/12/2017  . Diabetes mellitus ([place]) 09/12/2017  . Healthcare maintenance 09/12/2017    Past Surgical History:  Procedure Laterality Date  . TONSILLECTOMY         Family History  Problem Relation Age of Onset  . Diabetes Mother   . Colon cancer Neg Hx   . Esophageal cancer Neg Hx   . Rectal cancer Neg Hx   . Stomach cancer Neg Hx     Social History   Tobacco Use  . Smoking status: Former Research scientist (life sciences)  . Smokeless tobacco: Never Used  . Tobacco comment: Quit in 2017   Vaping Use  . Vaping Use: Never used  Substance Use Topics  . Alcohol use: Yes    Comment: Socially  . Drug use: Never    Home Medications Prior to Admission medications   Medication Sig Start Date End Date Taking? Authorizing  Provider  Continuous Blood Gluc Sensor (FREESTYLE LIBRE 14 DAY SENSOR) MISC 1 Device by Does not apply route every 14 (fourteen) days. 03/11/20  Yes Renato Shin, MD  Dulaglutide (TRULICITY) 1.5 CX/4.4YJ SOPN Inject 1.5 mg into the skin once a week. Patient taking differently: Inject 1.5 mg into the skin once a week. Sundays 03/11/20  Yes Renato Shin, MD  FARXIGA 10 MG TABS tablet TAKE ONE TABLET BY MOUTH DAILY 05/17/20  Yes Renato Shin, MD  glucose blood test strip 1 each by Other route 2 (two) times daily. And lancets 2/day 03/10/18  Yes Renato Shin, MD  ibuprofen (IBU) 800 MG tablet Take 1 tablet (800 mg total) by mouth every 8 (eight) hours as needed. Patient taking differently: Take 800 mg by mouth as needed (pain). 05/03/20  Yes Libby Maw, MD  insulin degludec (TRESIBA FLEXTOUCH) 100 UNIT/ML FlexTouch Pen Inject 40 Units into the skin daily. 03/11/20  Yes Renato Shin, MD  Insulin Pen Needle (PEN NEEDLES) 32G X 4 MM MISC 1 Package by Does not apply route 4 (four) times daily. 12/11/17  Yes Renato Shin, MD  losartan-hydrochlorothiazide (HYZAAR) 100-25 MG tablet TAKE ONE TABLET BY MOUTH DAILY **NO FURTHER REFILLS UNTIL DOCTOR VISIT** Patient taking differently: Take 1 tablet  by mouth daily. 09/28/19  Yes Libby Maw, MD  metFORMIN (GLUCOPHAGE-XR) 500 MG 24 hr tablet Take 1 tablet (500 mg total) by mouth 2 (two) times daily. 05/21/19  Yes Renato Shin, MD  OVER THE COUNTER MEDICATION Take 1 packet by mouth 2 (two) times daily. [place] powder two times daily.   Yes [provider]  rosuvastatin (CRESTOR) 20 MG tablet Take 1 tablet (20 mg total) by mouth daily. 05/03/20  Yes Libby Maw, MD  sildenafil (VIAGRA) 100 MG tablet TAKE ONE TABLET BY MOUTH DAILY AS NEEDED FOR ERECTILE DYSFUNCTION Patient taking differently: Take 100 mg by mouth as needed for erectile dysfunction. 11/10/19  Yes Renato Shin, MD  carbamide peroxide (DEBROX) 6.5 % OTIC solution  Place 5 drops into both ears 2 (two) times daily. Patient not taking: No sig reported 05/03/20   Libby Maw, MD  insulin lispro (HUMALOG KWIKPEN) 100 UNIT/ML KwikPen Inject 0.05-0.1 mLs (5-10 Units total) into the skin 3 (three) times daily with meals. And pen needles 4/day Patient not taking: No sig reported 05/21/19   Renato Shin, MD    Allergies    Patient has no known allergies.  Review of Systems   Review of Systems  Constitutional: Negative for appetite change.  HENT: Negative for congestion.   Cardiovascular: Negative for leg swelling.  Gastrointestinal: Negative for abdominal pain.  Genitourinary: Negative for flank pain.  Musculoskeletal: Negative for back pain.  Skin: Negative for rash.  Neurological: Positive for weakness and numbness.  Psychiatric/Behavioral: Negative for confusion.    Physical Exam Updated Vital Signs BP (!) 148/84   Pulse 93   Temp 98.2 F (36.8 C)   Resp 12   Wt 79.1 kg   SpO2 98%   BMI 25.75 kg/m   Physical Exam Vitals and nursing note reviewed.  HENT:     Head: Atraumatic.     Right Ear: External ear normal.     Left Ear: External ear normal.  Eyes:     Pupils: Pupils are equal, round, and reactive to light.  Cardiovascular:     Rate and Rhythm: Regular rhythm.  Pulmonary:     Effort: Pulmonary effort is normal.  Abdominal:     Tenderness: There is no abdominal tenderness.  Musculoskeletal:        General: No tenderness.     Cervical back: Neck supple.  Skin:    General: Skin is warm.  Neurological:     Mental Status: He is alert and oriented to person, place, and time.     Comments: Awake and appropriate.  Clear speech.  Eye moves intact.  Mild difficulty moving left hand but reportedly improved from prior.  Sensation grossly intact in hand.  Complete NIH scoring done by neurology.     ED Results / Procedures / Treatments   Labs (all labs ordered are listed, but only abnormal results are displayed) Labs  Reviewed  CBC - Abnormal; Notable for the following components:      Result Value   Platelets 435 (*)    All other components within normal limits  COMPREHENSIVE METABOLIC PANEL - Abnormal; Notable for the following components:   Glucose, Bld 101 (*)    Calcium 8.8 (*)    All other components within normal limits  PROTIME-INR  APTT  DIFFERENTIAL  HIV ANTIBODY (ROUTINE TESTING W REFLEX)  RAPID URINE DRUG SCREEN, HOSP PERFORMED  URINALYSIS, COMPLETE (UACMP) WITH MICROSCOPIC  I-STAT CHEM 8, ED  CBG MONITORING, ED  EKG None  Radiology MR BRAIN WO CONTRAST  Result Date: 05/31/2020 CLINICAL DATA:  Left-sided hand weakness EXAM: MRI HEAD WITHOUT CONTRAST TECHNIQUE: Multiplanar, multiecho pulse sequences of the brain and surrounding structures were obtained without intravenous contrast. COMPARISON:  None. FINDINGS: DWI, SWI, and T2 FLAIR sequences were obtained as part of a stroke protocol. There is a moderate size area of right frontoparietal diffusion hyperintensity with mild ADC isointensity. There is no substantial corresponding T2 FLAIR hyperintensity. This involves the perirolandic region. A punctate focus of susceptibility is present in this region may reflect microhemorrhage or mineralization. Additional patchy foci of T2 hyperintensity in the supratentorial white matter nonspecific but may reflect mild to moderate chronic microvascular ischemic changes. This includes a more confluent area along the posterior left lateral ventricle. No mass effect. No hydrocephalus or extra-axial collection. In addition to above, there is also a punctate area susceptibility along the right lateral aspect of the midbrain also reflecting chronic microhemorrhage or mineralization. IMPRESSION: Moderate size area of abnormal right frontoparietal diffusion involving the perirolandic region without significant corresponding T2 FLAIR hyperintensity. This may reflect hyperacute infarction. A punctate focus of  susceptibility is region could reflect age-indeterminate microhemorrhage. Chronic microvascular ischemic changes. A more confluent area of T2 hyperintensity along the posterior left lateral ventricle likely represents the same process, but a follow-up study in 1 year is suggested as an infiltrating neoplasm could have a similar appearance. Initial results were discussed by telephone at the time of interpretation on 05/31/2020 at 2:55 pm with provider Lesleigh Noe , who verbally acknowledged these results. Electronically Signed   By: Macy Mis M.D.   On: 05/31/2020 15:01   CT HEAD CODE STROKE WO CONTRAST  Result Date: 05/31/2020 CLINICAL DATA:  Code stroke. Last known normal 1330, left-sided weakness. EXAM: CT HEAD WITHOUT CONTRAST TECHNIQUE: Contiguous axial images were obtained from the base of the skull through the vertex without intravenous contrast. COMPARISON:  No pertinent prior exams available for comparison. FINDINGS: Brain: Cerebral volume is normal for age. Mild patchy and ill-defined hypoattenuation within the cerebral white matter, nonspecific but compatible with chronic small vessel ischemic disease. Small chronic infarct within the superior left cerebellum (series 5, image 49) (series 2, image 12). There is no acute intracranial hemorrhage. No demarcated cortical infarct. No extra-axial fluid collection. No evidence of intracranial mass. No midline shift. Vascular: No hyperdense vessel.  Atherosclerotic calcifications Skull: Normal. Negative for fracture or focal lesion. Sinuses/Orbits: Visualized orbits show no acute finding. Mild bilateral ethmoid and maxillary sinus mucosal thickening. ASPECTS [facility] Stroke Program Early CT Score) - Ganglionic level infarction (caudate, lentiform nuclei, internal capsule, insula, M1-M3 cortex): 7 - Supraganglionic infarction (M4-M6 cortex): 3 Total score (0-10 with 10 being normal): 10 These results were communicated to Dr. Curly Shores at 2:25 pmon  5/31/2022by text page via the [facility] messaging system. IMPRESSION: No evidence of acute intracranial abnormality.  ASPECTS is 10. Mild cerebral white matter chronic small vessel ischemic disease. Small chronic infarct within the superior left cerebellar hemisphere. Mild paranasal sinus disease, as described. Electronically Signed   By: Kellie Simmering DO   On: 05/31/2020 14:26   CT ANGIO HEAD CODE STROKE  Result Date: 05/31/2020 CLINICAL DATA:  Code stroke follow-up, abnormal MRI EXAM: CT ANGIOGRAPHY HEAD AND NECK TECHNIQUE: Multidetector CT imaging of the head and neck was performed using the standard protocol during bolus administration of intravenous contrast. Multiplanar CT image reconstructions and MIPs were obtained to evaluate the vascular anatomy. Carotid stenosis measurements (when applicable) are  obtained utilizing NASCET criteria, using the distal internal carotid diameter as the denominator. CONTRAST:  46mL OMNIPAQUE IOHEXOL 350 MG/ML SOLN COMPARISON:  None. FINDINGS: CTA NECK FINDINGS Aortic arch: Mixed plaque along the aortic arch and patent great vessel origins. There is eccentric noncalcified plaque along the proximal left subclavian causing less than 50% stenosis. Right carotid system: Patent. Mixed plaque at the proximal ICA causes less than 50% stenosis. Left carotid system: Patent. Mixed but primarily calcified plaque at the proximal ICA causes less than 50% stenosis. Vertebral arteries: Patent and codominant.  No stenosis. Skeleton: Degenerative changes of the cervical spine. Other neck: Unremarkable. Upper chest: No apical lung mass. Review of the MIP images confirms the above findings CTA HEAD FINDINGS Anterior circulation: Intracranial internal carotid arteries are patent with mixed plaque causing up to moderate stenosis on the left at the level of the distal cavernous segment. Right M1 and proximal M2 MCA branches are patent. There is a probable attenuated right M4 branch in proximity to the  area of infarction; difficult to separate from venous enhancement due to contrast timing. Left middle and both anterior cerebral arteries are patent. Anterior communicating artery is present. Posterior circulation: Intracranial vertebral arteries are patent. Left vertebral artery becomes diminutive after PICA origin. There is a small caliber proximal basilar artery. 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 Major cerebellar artery origins are patent. Posterior cerebral arteries are patent. A left posterior communicating artery is present with fetal origin of the left PCA. Probable small right posterior communicating artery. Venous sinuses: As permitted by contrast timing, patent. Review of the MIP images confirms the above findings IMPRESSION: No large vessel occlusion and neck. There is no proximal intracranial vessel occlusion. Probable attenuated right M4 MCA branch in proximity area of infarction. Plaque at the right greater than left proximal internal carotids causes less than 50% stenosis. 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 Electronically Signed   By: Macy Mis M.D.   On: 05/31/2020 15:28   CT ANGIO NECK CODE STROKE  Result Date: 05/31/2020 CLINICAL DATA:  Code stroke follow-up, abnormal MRI EXAM: CT ANGIOGRAPHY HEAD AND NECK TECHNIQUE: Multidetector CT imaging of the head and neck was performed using the standard protocol during bolus administration of intravenous contrast. Multiplanar CT image reconstructions and MIPs were obtained to evaluate the vascular anatomy. Carotid stenosis measurements (when applicable) are obtained utilizing NASCET criteria, using the distal internal carotid diameter as the denominator. CONTRAST:  71mL OMNIPAQUE IOHEXOL 350 MG/ML SOLN COMPARISON:  None. FINDINGS: CTA NECK FINDINGS Aortic arch: Mixed plaque along the aortic arch and patent great vessel origins. There is eccentric  noncalcified plaque along the proximal left subclavian causing less than 50% stenosis. Right carotid system: Patent. Mixed plaque at the proximal ICA causes less than 50% stenosis. Left carotid system: Patent. Mixed but primarily calcified plaque at the proximal ICA causes less than 50% stenosis. Vertebral arteries: Patent and codominant.  No stenosis. Skeleton: Degenerative changes of the cervical spine. Other neck: Unremarkable. Upper chest: No apical lung mass. Review of the MIP images confirms the above findings CTA HEAD FINDINGS Anterior circulation: Intracranial internal carotid arteries are patent with mixed plaque causing up to moderate stenosis on the left at the level of the distal cavernous segment. Right M1 and proximal M2 MCA branches are patent. There is a probable attenuated right M4 branch in proximity to the area of infarction; difficult to separate from venous  enhancement due to contrast timing. Left middle and both anterior cerebral arteries are patent. Anterior communicating artery is present. Posterior circulation: Intracranial vertebral arteries are patent. Left vertebral artery becomes diminutive after PICA origin. There is a small caliber proximal basilar artery. 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 Major cerebellar artery origins are patent. Posterior cerebral arteries are patent. A left posterior communicating artery is present with fetal origin of the left PCA. Probable small right posterior communicating artery. Venous sinuses: As permitted by contrast timing, patent. Review of the MIP images confirms the above findings IMPRESSION: No large vessel occlusion and neck. There is no proximal intracranial vessel occlusion. Probable attenuated right M4 MCA branch in proximity area of infarction. Plaque at the right greater than left proximal internal carotids causes less than 50% stenosis. 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 Electronically Signed   By: Macy Mis M.D.   On: 05/31/2020 15:28    Procedures Procedures   Medications Ordered in ED Medications  sodium chloride flush (NS) 0.9 % injection 3 mL (3 mLs Intravenous Not Given 05/31/20 1546)   stroke: mapping our early stages of recovery book ( Does not apply Not Given 05/31/20 1547)  0.9 %  sodium chloride infusion (has no administration in time range)  acetaminophen (TYLENOL) tablet 650 mg (has no administration in time range)    Or  acetaminophen (TYLENOL) 160 MG/5ML solution 650 mg (has no administration in time range)    Or  acetaminophen (TYLENOL) suppository 650 mg (has no administration in time range)  senna-docusate (Senokot-S) tablet 1 tablet (has no administration in time range)  pantoprazole (PROTONIX) injection 40 mg (has no administration in time range)  alteplase (ACTIVASE) 1 mg/mL infusion 71.3 mg (0 mg Intravenous Stopped 05/31/20 1549)    Followed by  0.9 %  sodium chloride infusion (50 mLs Intravenous New Bag/Given 05/31/20 1553)  iohexol (OMNIPAQUE) 350 MG/ML injection 50 mL (50 mLs Intravenous Contrast Given 05/31/20 1500)    ED Course  I have reviewed the triage vital signs and the nursing notes.  Pertinent labs & imaging results that were available during my care of the patient were reviewed by me and considered in my medical decision making (see chart for details).    MDM Rules/Calculators/A&P                          Patient came in as a code stroke.  By the time I had seen him already had had tPA.  Has had improvement of symptoms.  Admit to ICU with neurology. Final Clinical Impression(s) / ED Diagnoses Final diagnoses:  Cerebrovascular accident (CVA), unspecified mechanism [facility])    Rx / [place] Orders ED Discharge Orders    None       Davonna Belling, MD 05/31/20 [phone]

## 2020-05-31 NOTE — Progress Notes (Addendum)
PHARMACIST CODE STROKE RESPONSE  Notified to mix tPA at 1444 by Dr. Curly Shores Delivered tPA to RN at 1448  tPA dose = 7.1 mg bolus over 1 minute followed by 64.2 mg for a total dose of 71.3 mg over 1 hour  Issues/delays encountered (if applicable):   John [name], PharmD, [facility] Resident [phone] 05/31/2020 2:56 PM

## 2020-05-31 NOTE — Progress Notes (Signed)
1549 tpa dose finished, 50 ml NS bolus initiated. NIHSS currently 0.

## 2020-05-31 NOTE — Consult Note (Addendum)
Neurology Consultation  Reason for Consult: Left hand weakness and numbness Referring Physician: Dr. Regenia Skeeter  CC: Left hand weakness and numbness  History is obtained from: Patient, Chart Review  HPI: John [name] is a 60 y.o. male with a medical history significant for hypertension, hyperlipidemia, and type 2 diabetes without complication who presented to the ED today via EMS for evaluation of left hand numbness and weakness. He states that he has had left hand numbness and tingling that started approximately 2 months ago happen approximately once weekly and last "a few minutes" before resolving. He states that today, he laid down for a nap around 09:00 and his left hand felt fine. When he woke up, he went to reach into his pocket and felt that his left hand was numb and weak. He states that he has never felt weakness this severe, mainly just numbness and tingling. He attempted to eat lunch and tried picking up a piece of pizza with his left hand and he was unable to do so. EMS was activated and John [name] was brought to the ED for further evaluation. CBG on scene was 80 with a blood pressure of 152/100.   LKW: 09:00 when he went to bed, woke at 13:20 with deficits tpa given?: yes, DWI changes with ADC correlate without significant corresponding T2 Flair hyperintensity as wake-up stroke protocol.  IR Thrombectomy? No, CT angio negative for large vessel occlusion Modified Rankin Scale: 0-Completely asymptomatic and back to baseline post- stroke  ROS: A complete ROS was performed and is negative except as noted in the HPI.  Past Medical History:  Diagnosis Date  . Arthritis   . Diabetes mellitus without complication ([place])   . Hyperlipidemia   . Hypertension    Past Surgical History:  Procedure Laterality Date  . TONSILLECTOMY     Family History  Problem Relation Age of Onset  . Diabetes Mother   . Colon cancer Neg Hx   . Esophageal cancer Neg Hx   . Rectal cancer Neg Hx   .  Stomach cancer Neg Hx    Social History:   reports that he has quit smoking. He has never used smokeless tobacco. He reports current alcohol use. He reports that he does not use drugs.  Medications  Current Facility-Administered Medications:  .  0.9 %  sodium chloride infusion, 500 mL, Intravenous, Once, Irene Shipper, MD .  sodium chloride flush (NS) 0.9 % injection 3 mL, 3 mL, Intravenous, Once, Sherwood Gambler, MD  Current Outpatient Medications:  .  carbamide peroxide (DEBROX) 6.5 % OTIC solution, Place 5 drops into both ears 2 (two) times daily., Disp: 15 mL, Rfl: 3 .  Continuous Blood Gluc Sensor (FREESTYLE LIBRE 14 DAY SENSOR) MISC, 1 Device by Does not apply route every 14 (fourteen) days., Disp: 6 each, Rfl: 3 .  Dulaglutide (TRULICITY) 1.5 NK/5.3ZJ SOPN, Inject 1.5 mg into the skin once a week., Disp: 6 mL, Rfl: 3 .  FARXIGA 10 MG TABS tablet, TAKE ONE TABLET BY MOUTH DAILY, Disp: 30 tablet, Rfl: 3 .  glucose blood test strip, 1 each by Other route 2 (two) times daily. And lancets 2/day, Disp: 200 each, Rfl: 3 .  ibuprofen (IBU) 800 MG tablet, Take 1 tablet (800 mg total) by mouth every 8 (eight) hours as needed., Disp: 30 tablet, Rfl: 0 .  insulin degludec (TRESIBA FLEXTOUCH) 100 UNIT/ML FlexTouch Pen, Inject 40 Units into the skin daily., Disp: 15 mL, Rfl: 3 .  insulin lispro (  HUMALOG KWIKPEN) 100 UNIT/ML KwikPen, Inject 0.05-0.1 mLs (5-10 Units total) into the skin 3 (three) times daily with meals. And pen needles 4/day, Disp: 15 mL, Rfl: 11 .  Insulin Pen Needle (PEN NEEDLES) 32G X 4 MM MISC, 1 Package by Does not apply route 4 (four) times daily., Disp: 360 each, Rfl: 2 .  losartan-hydrochlorothiazide (HYZAAR) 100-25 MG tablet, TAKE ONE TABLET BY MOUTH DAILY **NO FURTHER REFILLS UNTIL DOCTOR VISIT**, Disp: 30 tablet, Rfl: 5 .  metFORMIN (GLUCOPHAGE-XR) 500 MG 24 hr tablet, Take 1 tablet (500 mg total) by mouth 2 (two) times daily., Disp: 180 tablet, Rfl: 3 .  OVER THE COUNTER  MEDICATION, Goody's powder two times daily., Disp: , Rfl:  .  rosuvastatin (CRESTOR) 20 MG tablet, Take 1 tablet (20 mg total) by mouth daily., Disp: 90 tablet, Rfl: 3 .  sildenafil (VIAGRA) 100 MG tablet, TAKE ONE TABLET BY MOUTH DAILY AS NEEDED FOR ERECTILE DYSFUNCTION, Disp: 30 tablet, Rfl: 2  Exam: Current vital signs: BP 135/85 (BP Location: Right Arm)   Pulse 91   Resp 16   SpO2 100%  Vital signs in last 24 hours:   GENERAL: Awake, alert, in no acute distress Psych: Affect appropriate for situation, calm and cooperative with examination Head: Normocephalic and atraumatic without obvious abnormality EENT: Normal conjunctivae, no OP obstruction LUNGS: Normal respiratory effort. Non-labored breathing CV: Regular rate on cardiac monitor, extremities without edema ABDOMEN - Soft, non-tender Ext: warm, well perfused, without obvious deformity  NEURO:  Mental Status: Awake, alert, and oriented to person, place, time, age, month, year, and situation. Patient is able to provide a clear and coherent history of present illness.  Speech/Language: speech is intact without dysarthria or aphasia.  Naming, repetition, fluency, and comprehension intact. No neglect is noted.  Cranial Nerves:  [name]: PERRL 3 mm / brisk. visual fields full.  III, IV, VI: EOMI without ptosis V: Sensation is intact to light touch and symmetrical to face.  VII: Face is symmetric resting and smiling.  VIII: Hearing is intact to voice IX, X: Palate elevation is symmetric. Phonation normal.  XI: Normal sternocleidomastoid and trapezius muscle strength XII: Tongue protrudes midline without fasciculations.   Motor: 5/5 strength is all muscle groups without vertical drift on assessment. Right > left grip strength with some noted weakness of the left hand. Tone and bulk are normal.  Sensation: Intact to light touch bilaterally in all four extremities with decreased sensation to the left hand compared to the right. Sensation  to light touch of the forearms are intact and symmetric bilaterally. No extinction to DSS present.  Coordination: FTN intact bilaterally. HKS intact bilaterally. No pronator drift.  DTRs: 2+ and symmetric biceps and brachioradialis Plantars: Toes downgoing bilaterally Gait: Deferred  NIHSS: 1a Level of Conscious.: 0 1b LOC Questions: 0 1c LOC Commands: 0 2 Best Gaze: 0 3 Visual: 0 4 Facial Palsy: 0 5a Motor Arm - left: 0 5b Motor Arm - Right: 0 6a Motor Leg - Left: 0 6b Motor Leg - Right: 0 7 Limb Ataxia: 0 8 Sensory: 1 9 Best Language: 0 10 Dysarthria: 0 11 Extinct. and Inatten.: 0 TOTAL: 1  Labs I have reviewed labs in epic and the results pertinent to this consultation are: CBC    Component Value Date/Time   WBC 9.0 05/03/2020 0849   RBC 5.15 05/03/2020 0849   HGB 15.5 05/03/2020 0849   HCT 45.9 05/03/2020 0849   PLT 384.0 05/03/2020 0849   MCV 89.1 05/03/2020  0849   MCHC 33.8 05/03/2020 0849   RDW 13.9 05/03/2020 0849   CMP     Component Value Date/Time   NA 137 05/03/2020 0849   K 4.0 05/03/2020 0849   CL 102 05/03/2020 0849   CO2 26 05/03/2020 0849   GLUCOSE 112 (H) 05/03/2020 0849   BUN 24 (H) 05/03/2020 0849   CREATININE 0.82 05/03/2020 0849   CALCIUM 9.2 05/03/2020 0849   PROT 6.7 05/03/2020 0849   ALBUMIN 4.2 05/03/2020 0849   AST 15 05/03/2020 0849   ALT 16 05/03/2020 0849   ALKPHOS 77 05/03/2020 0849   BILITOT 0.7 05/03/2020 0849   Lipid Panel     Component Value Date/Time   CHOL 154 05/03/2020 0849   TRIG 86.0 05/03/2020 0849   HDL 47.10 05/03/2020 0849   CHOLHDL 3 05/03/2020 0849   VLDL 17.2 05/03/2020 0849   LDLCALC 90 05/03/2020 0849   Lab Results  Component Value Date   HGBA1C 7.9 (A) 03/11/2020   Imaging I have reviewed the images obtained:  CT-scan of the brain: No evidence of acute intracranial abnormality.  ASPECTS is 10. Mild cerebral white matter chronic small vessel ischemic disease. Small chronic infarct within the  superior left cerebellar hemisphere. Mild paranasal sinus disease, as described.  CT Angio Head and Neck: No large vessel occlusion and neck. There is no proximal intracranial vessel occlusion. Probable attenuated right M4 MCA branch in proximity area of infarction. Plaque at the right greater than left proximal internal carotids causes less than 50% stenosis. 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  MRI examination of the brain: Moderate size area of abnormal right frontoparietal diffusion involving the perirolandic region without significant corresponding T2 FLAIR hyperintensity. This may reflect hyperacute infarction. A punctate focus of susceptibility is region could reflect age-indeterminate microhemorrhage. Chronic microvascular ischemic changes. A more confluent area of T2 hyperintensity along the posterior left lateral ventricle likely represents the same process, but a follow-up study in 1 year is suggested as an infiltrating neoplasm could have a similar appearance.  Assessment: 60 year old male with PMHx of HTN, HLD, and DM who presents with left hand weakness and numbness, with imaging confirming acute stroke now s/p tPA.  - Examination is significant for left hand weakness, weak grip strength on the left compared to the right, and decreased sensation to the left hand.  - Stroke risk factors include remote tobacco smoking history, hypertension, hyperlipidemia, type 2 diabetes mellitus, and recent long travel.  - Patient is somewhat of a poor historian and unable to clarify onset and frequency of left hand numbness and tingling (weeks or months ago) but does state that his weakness of his left hand has never been this significant and has never interfered with his normal functioning.  - CT imaging without acute intracranial abnormality. MRI with evidence of abnormal right frontoparietal diffusion without significant corresponding T2 FLAIR hyperintensity. With  evidence of hyperacute infarction, the decision was made to give tPA based on wake-up stroke protocol.  - Due to large area of restricted diffusion on MRI, the decision was made to take the patient for CT angio head and neck to adequately rule out LVO and need for interventional radiology. CT angio was without evidence of LVO with possible right M4 attenuation but without proximal vessel occlusion. - CT angio neck revealed plaque right > left within the proximal internal carotids with resulting < 50% stenosis, though on the right carotid this plaque appears irregular and potentially ulcerated -  Etiology of acute ischemic stroke likely related to right carotid stenosis versus embolic in nature stroke.  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%   Plan: Acute Ischemic Stroke Cerebral infarction due to embolism of right middle cerebral artery Stenosis of R carotid artery  Acuity: Acute; potentially candidate for low intensity monitoring if he remains stable for 2 hours s/p tPA  Current Suspected Etiology: Possibly symptomatic right carotid stenosis given ulcerated appearance of the plaque though the stenosis is not severe Continue Evaluation:  -Admit to: ICU, observation if stable 2 hours s/p tPA administration -Continue Statin -Hold Aspirin until 24 hour post tPA neuroimaging is stable and without evidence of bleeding -Blood pressure control, goal of SYS < 180 -MRI/ECHO/A1C/Lipid panel. -Hyperglycemia management per SSI to maintain glucose 140-180mg /dL. -PT/OT/ST therapies and recommendations when able  CNS NPO until clears swallow screen -ST as needed -Advance diet as tolerated  Left hand weakness and numbness following cerebral infarction affecting left non-dominant side -PT/OT  RESP No acute respiratory issues - Maintain SpO2 > 92%  CV Essential (primary) hypertension -Aggressive BP control,  goal SBP < 180 -Titrate oral agents  Hyperlipidemia, unspecified  - Statin for goal LDL < 70  HEME Coagulopathy secondary to tPA -Close neurochecks as above  Mild thrombocytosis may be reactive  -Trend platelet counts  ENDO Type 2 diabetes mellitus w/o complications. -SSI -goal HgbA1c < 7%  GI/GU No active issues  Fluid/Electrolyte Disorders No active issues, n.p.o. pending swallow evaluation  ID No active issues  Nutrition E66.9 Obesity (BMI 25.75) -diet and exercise counseling  Prophylaxis DVT:  SCDs GI: PPI Bowel: Docusate / Senna  Diet: NPO until cleared by speech  Code Status: Full Code   THE FOLLOWING WERE PRESENT ON ADMISSION: CNS -  Acute Ischemic Stroke, weakness of the left hand   Anibal Henderson, AGAC-NP [facility] Pager: [phone]  Attending Neurologist's note:  I personally saw this patient, gathering history, performing a full neurologic examination, reviewing relevant labs, personally reviewing relevant imaging including head CT, CTA, MRI brain, and formulated the assessment and plan, adding the note above for completeness and clarity to accurately reflect my thoughts  Lesleigh Noe MD-PhD [facility] [phone]   CRITICAL CARE Performed by: Lorenza Chick   Total critical care time: 60 minutes  Critical care time was exclusive of separately billable procedures and treating other patients, and independent of time spent by nurse practitioner  Critical care was necessary to treat or prevent imminent or life-threatening deterioration.  Critical care was time spent personally by me on the following activities: development of treatment plan with patient and/or surrogate as well as nursing, discussions with consultants, evaluation of patient's response to treatment, examination of patient, obtaining history from patient or surrogate, ordering and performing treatments and interventions, ordering and review of  laboratory studies, ordering and review of radiographic studies, pulse oximetry and re-evaluation of patient's condition.

## 2020-05-31 NOTE — ED Notes (Signed)
Refused report at this time. Nurse is not available.

## 2020-05-31 NOTE — Code Documentation (Signed)
Pt is 60 yr old male with h/o diabetes, htn, and hyperlipidemia who c/o sudden onset of left hand weakness and sensation loss today when he awoke from his nap at 1320. He went to sleep at 0900, so that is his LKW. He was cleared at bridge (CBG and labs obtained) and taken to CT at 1410. NCCT neg for acute hemorrhage per Dr Curly Shores. Pt taken to MRI to determine eligibility for TPA. Per Dr Curly Shores, Pt does have area of ischemia on MRI, and pharmacy was instructed to mix TPA at 1443. TPA was given at 1449 (bolus of 7.1 mg over one minute followed by infusion of 64.3 mg over one hr). Pt was taken again to CT for CTA to ensure that pt does not have an LVO. Per Dr Curly Shores, pt does not have an LVO. He will return to ED room 28 and continue stroke workup. He will need VS and mNIHSS q 15 for the first two hours, then q 30 min for 6 hr, then hourly for 16 hrs. Plan of care discussed with Katrina, RN, primary RN.

## 2020-06-01 ENCOUNTER — Inpatient Hospital Stay (HOSPITAL_COMMUNITY): Payer: Managed Care, Other (non HMO)

## 2020-06-01 ENCOUNTER — Other Ambulatory Visit: Payer: Self-pay

## 2020-06-01 DIAGNOSIS — I639 Cerebral infarction, unspecified: Secondary | ICD-10-CM | POA: Diagnosis not present

## 2020-06-01 DIAGNOSIS — E1165 Type 2 diabetes mellitus with hyperglycemia: Secondary | ICD-10-CM | POA: Diagnosis not present

## 2020-06-01 DIAGNOSIS — E7800 Pure hypercholesterolemia, unspecified: Secondary | ICD-10-CM | POA: Diagnosis not present

## 2020-06-01 DIAGNOSIS — I6389 Other cerebral infarction: Secondary | ICD-10-CM | POA: Diagnosis not present

## 2020-06-01 LAB — COMPREHENSIVE METABOLIC PANEL
ALT: 18 U/L (ref 0–44)
AST: 19 U/L (ref 15–41)
Albumin: 3.3 g/dL — ABNORMAL LOW (ref 3.5–5.0)
Alkaline Phosphatase: 65 U/L (ref 38–126)
Anion gap: 6 (ref 5–15)
BUN: 16 mg/dL (ref 6–20)
CO2: 24 mmol/L (ref 22–32)
Calcium: 8.7 mg/dL — ABNORMAL LOW (ref 8.9–10.3)
Chloride: 107 mmol/L (ref 98–111)
Creatinine, Ser: 0.76 mg/dL (ref 0.61–1.24)
GFR, Estimated: >60 mL/min (ref >60)
Glucose, Bld: 99 mg/dL (ref 70–99)
Potassium: 3.9 mmol/L (ref 3.5–5.1)
Sodium: 137 mmol/L (ref 135–145)
Total Bilirubin: 0.9 mg/dL (ref 0.3–1.2)
Total Protein: 6.4 g/dL — ABNORMAL LOW (ref 6.5–8.1)

## 2020-06-01 LAB — CBC
HCT: 44.4 % (ref 39.0–52.0)
Hemoglobin: 14.6 g/dL (ref 13.0–17.0)
MCH: 29.4 pg (ref 26.0–34.0)
MCHC: 32.9 g/dL (ref 30.0–36.0)
MCV: 89.3 fL (ref 80.0–100.0)
Platelets: 432 10*3/uL — ABNORMAL HIGH (ref 150–400)
RBC: 4.97 MIL/uL (ref 4.22–5.81)
RDW: 13.2 % (ref 11.5–15.5)
WBC: 9.7 10*3/uL (ref 4.0–10.5)
nRBC: 0 % (ref 0.0–0.2)

## 2020-06-01 LAB — LIPID PANEL
Cholesterol: 118 mg/dL (ref 0–200)
HDL: 36 mg/dL — ABNORMAL LOW (ref >40)
LDL Cholesterol: 64 mg/dL (ref 0–99)
Total CHOL/HDL Ratio: 3.3 RATIO
Triglycerides: 91 mg/dL (ref <150)
VLDL: 18 mg/dL (ref 0–40)

## 2020-06-01 LAB — CBG MONITORING, ED: Glucose-Capillary: 102 mg/dL — ABNORMAL HIGH (ref 70–99)

## 2020-06-01 LAB — ECHOCARDIOGRAM COMPLETE
Area-P 1/2: 2.83 cm2
S' Lateral: 3.5 cm
Weight: 2790.4 oz

## 2020-06-01 LAB — GLUCOSE, CAPILLARY: Glucose-Capillary: 195 mg/dL — ABNORMAL HIGH (ref 70–99)

## 2020-06-01 LAB — HIV ANTIBODY (ROUTINE TESTING W REFLEX): HIV Screen 4th Generation wRfx: NONREACTIVE

## 2020-06-01 LAB — SARS CORONAVIRUS 2 (TAT 6-24 HRS): SARS Coronavirus 2: NEGATIVE

## 2020-06-01 IMAGING — MR MR HEAD W/O CM
6 of 11 series · 24 of 48 positions shown · non-contrast
Comparison: CT head without contrast, MR head without contrast, and
CTA head and neck [DATE].

CLINICAL DATA: Stroke.  Follow-up.

EXAM:
MRI HEAD WITHOUT CONTRAST
TECHNIQUE: Multiplanar, multiecho pulse sequences of the brain and surrounding
structures were obtained without intravenous contrast.

[Series 2: DWI · axial · 3.0mm · 0.94mm/px · z∈[-59,+102]mm · 7 of 110 slices shown (1 of 2)]
[im 1/110]
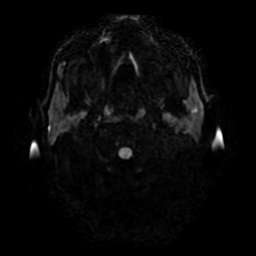
[im 19/110]
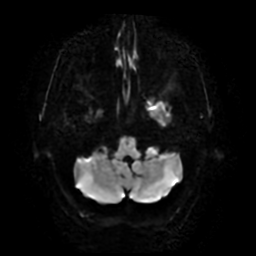
[im 37/110]
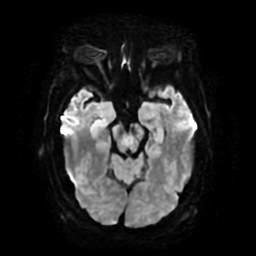
[im 55/110]
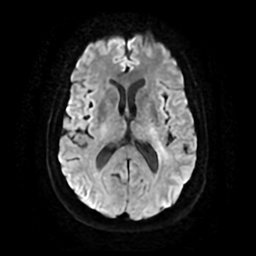
[im 73/110]
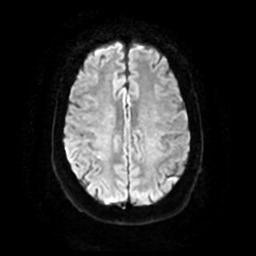
[im 91/110]
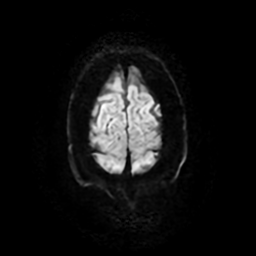
[im 110/110]
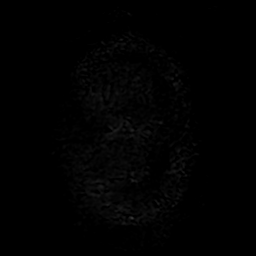

[Series 3: DWI · coronal · 4.0mm · 0.94mm/px · 6 of 78 slices shown (2 of 2)]
[im 1/78]
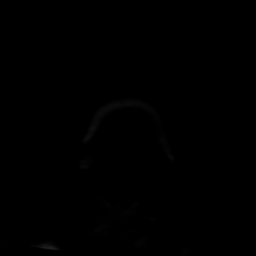
[im 16/78]
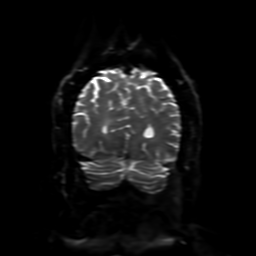
[im 31/78]
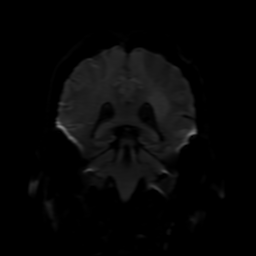
[im 47/78]
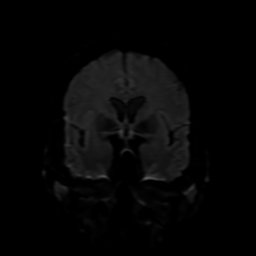
[im 62/78]
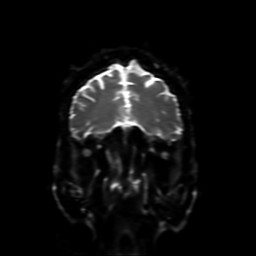
[im 78/78]
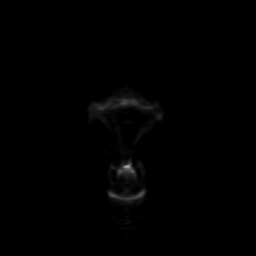

[Series 4: FLAIR · sagittal · 5.0mm · 0.23mm/px · 2 of 27 slices shown (1 of 2)]
[im 1/27]
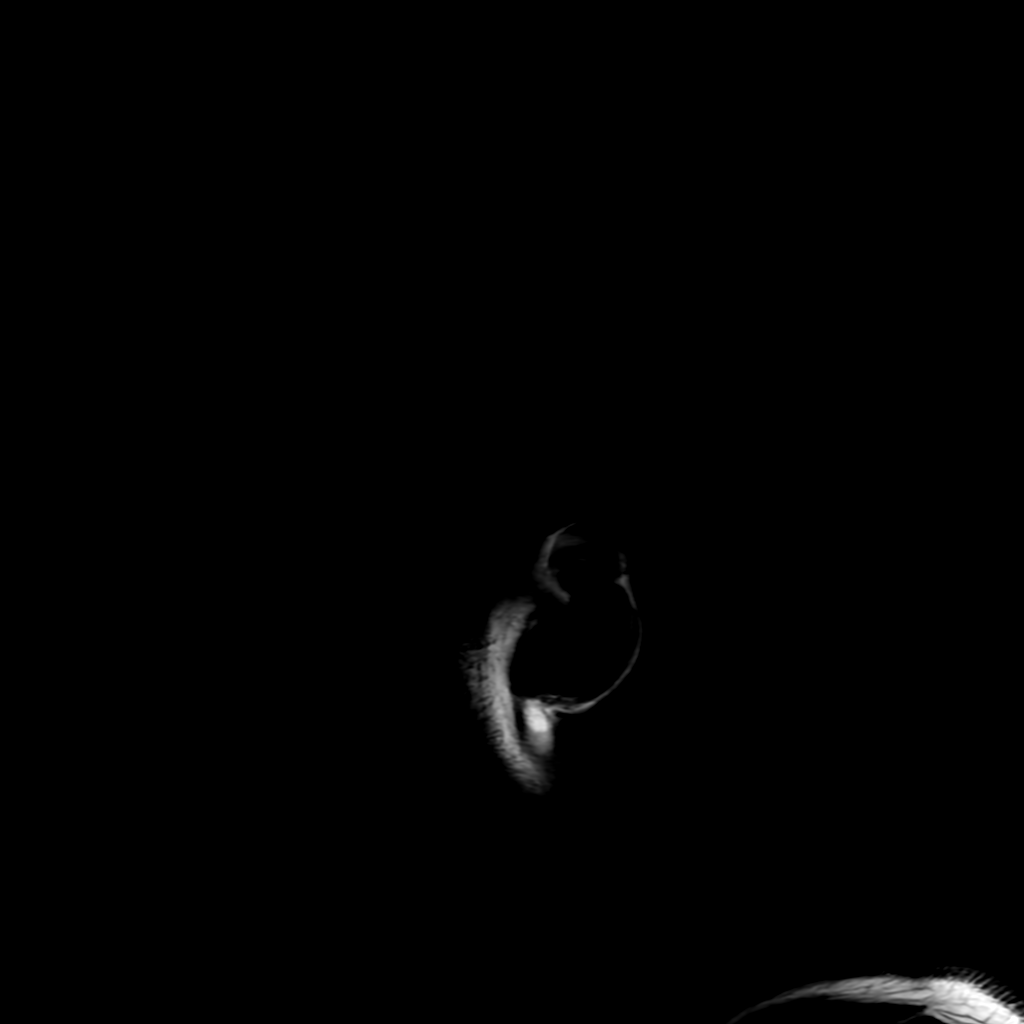
[im 27/27]
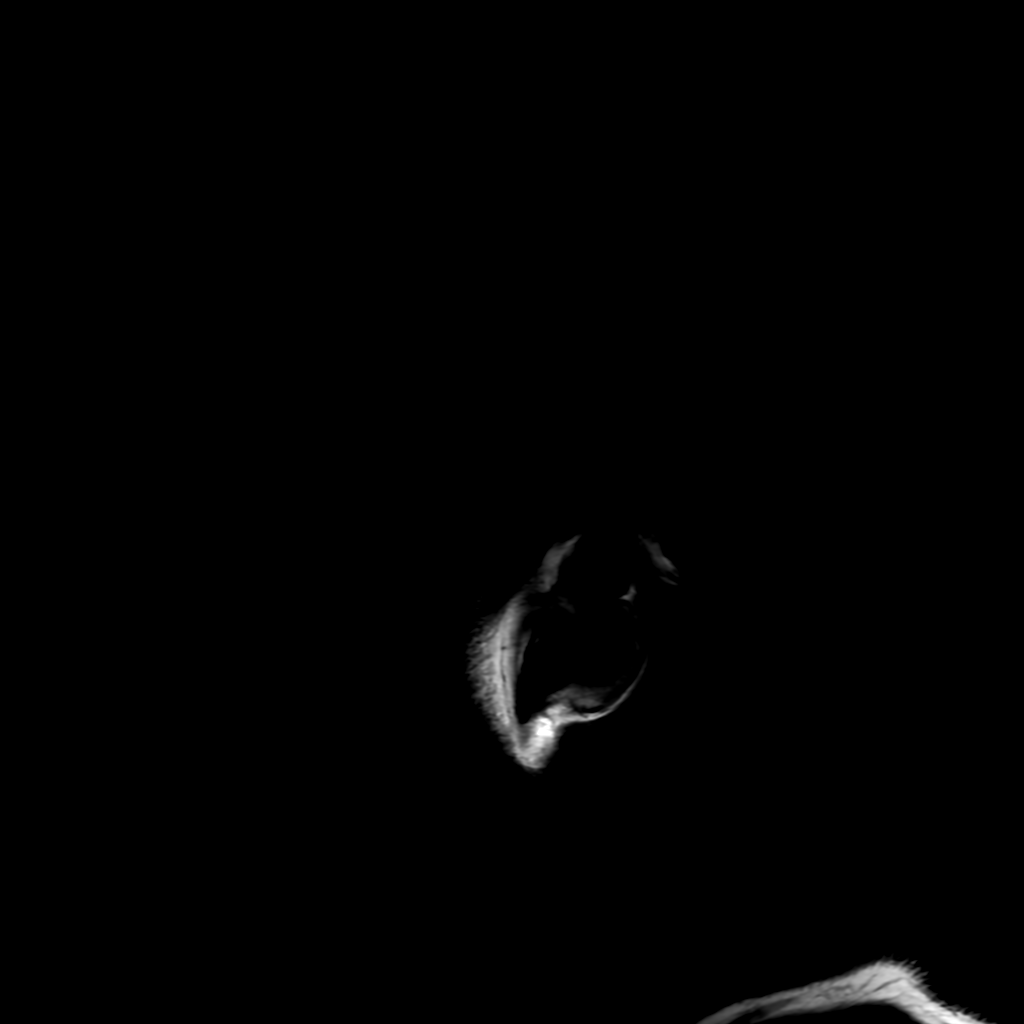

[Series 6: FLAIR · axial · 3.0mm · 0.45mm/px · z∈[-62,+99]mm · 2 of 28 slices shown (2 of 2)]
[im 1/28]
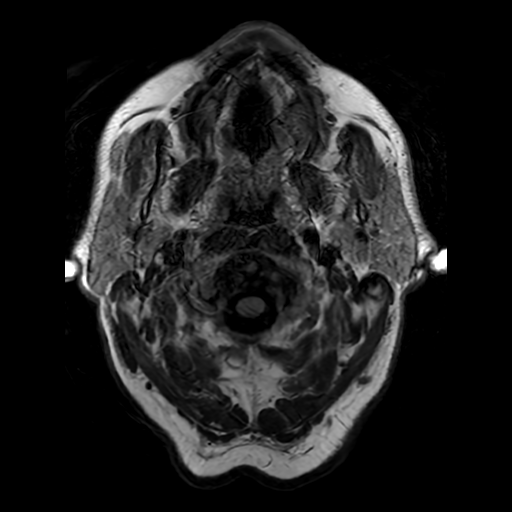
[im 28/28]
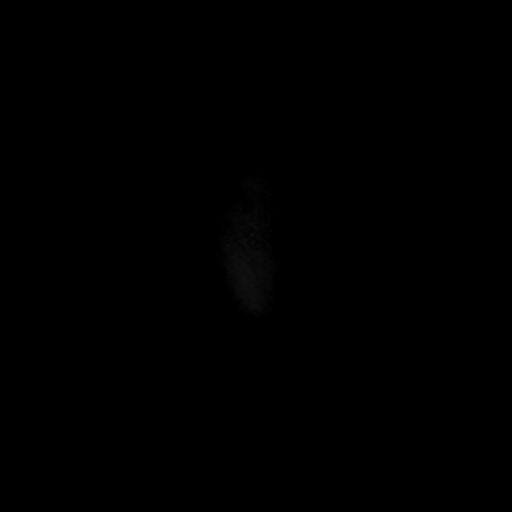

[Series 250: ADC · axial · 3.0mm · 0.94mm/px · z∈[-59,+102]mm · 4 of 55 slices shown (1 of 2)]
[im 1/55]
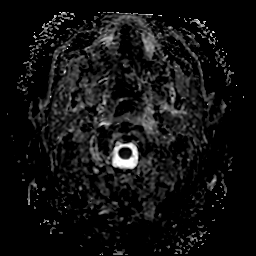
[im 19/55]
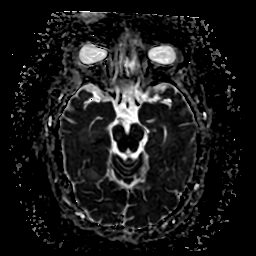
[im 37/55]
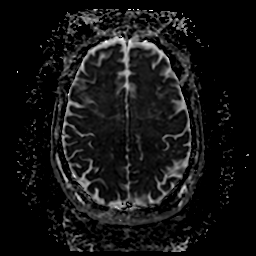
[im 55/55]
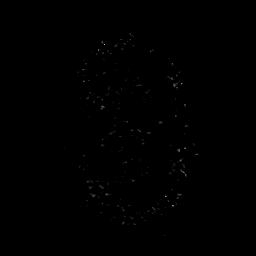

[Series 350: ADC · coronal · 4.0mm · 0.94mm/px · 3 of 40 slices shown (2 of 2)]
[im 1/40]
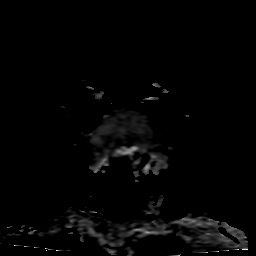
[im 20/40]
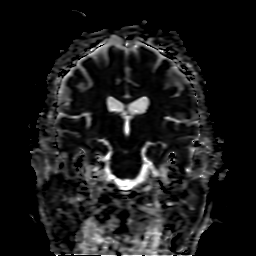
[im 40/40]
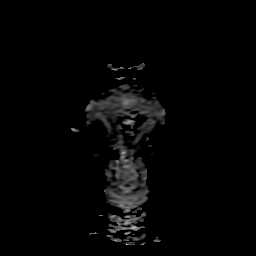

[24 of 48 positions shown; findings below may reference images not displayed]

FINDINGS: Brain: Previously noted area of diffusion abnormality involving the
perirolandic cortex is mostly resolved. A more discrete area of
cortical infarction is present one sulci of more posterior in the
right parietal lobe. T2 and FLAIR signal changes are present in the
cortex of the better defined and more posterior infarct. There are
no T2 or FLAIR changes in perirolandic cortex.

Scattered periventricular and subcortical white matter changes are
otherwise stable. The more confluent area adjacent to the atrium of
the left lateral ventricle is stable. No acute hemorrhage or mass
lesion is present. Ventricles are of normal size. No significant
extra-axial fluid collection present.

No additional acute infarcts are present. A remote lacunar infarct
of the left cerebellum is again noted.

Vascular: Flow is present in the major intracranial arteries.

Skull and upper cervical spine: Focal disc disease is present at
C3-4. Craniocervical junction is normal. Mild degenerative changes
are present C1-2. Marrow signal is otherwise normal.

Sinuses/Orbits: Mild mucosal thickening is present in the maxillary
sinuses and anterior ethmoid air cells bilaterally. The globes and
orbits are within normal limits.
IMPRESSION: 1. Previously noted area of diffusion abnormality involving the
perirolandic deck cortex is mostly resolved.
2. More discrete area of new acute cortical infarction in the
slightly more posterior right parietal lobe.
3. Remote lacunar infarct of the left cerebellum.
4. Stable atrophy and white matter disease otherwise likely reflects
the sequela of chronic microvascular ischemia.
5. Mild sinus disease.

## 2020-06-01 MED ORDER — TRAZODONE HCL 100 MG PO TABS
100.0000 mg | ORAL_TABLET | Freq: Every day | ORAL | Status: DC
  Administered 2020-06-01: 100 mg via ORAL
  Filled 2020-06-01: qty 1

## 2020-06-01 MED ORDER — ASPIRIN EC 81 MG PO TBEC: 81.0000 mg | DELAYED_RELEASE_TABLET | Freq: Every day | ORAL | Status: DC

## 2020-06-01 MED ORDER — ASPIRIN EC 81 MG PO TBEC
81.0000 mg | DELAYED_RELEASE_TABLET | Freq: Every day | ORAL | Status: DC
  Administered 2020-06-01 – 2020-06-02 (×2): 81 mg via ORAL
  Filled 2020-06-01 (×2): qty 1

## 2020-06-01 MED ORDER — CLOPIDOGREL BISULFATE 75 MG PO TABS
75.0000 mg | ORAL_TABLET | Freq: Every day | ORAL | Status: DC
  Administered 2020-06-02: 75 mg via ORAL
  Filled 2020-06-01: qty 1

## 2020-06-01 MED ORDER — ASPIRIN EC 81 MG PO TBEC: 81.0000 mg | DELAYED_RELEASE_TABLET | Freq: Once | ORAL | Status: DC

## 2020-06-01 MED ORDER — ROSUVASTATIN CALCIUM 20 MG PO TABS
20.0000 mg | ORAL_TABLET | Freq: Every day | ORAL | Status: DC
  Administered 2020-06-01 – 2020-06-02 (×2): 20 mg via ORAL
  Filled 2020-06-01 (×2): qty 1

## 2020-06-01 NOTE — Progress Notes (Addendum)
STROKE TEAM PROGRESS NOTE   INTERVAL HISTORY No acute events since arrival.  Today he is reporting resolution of his presenting left arm numbness and weakness since yesterday evening. His left arm feels back to normal today. He just finished 2D Echo and MRI. He lives with wife and works as a Clinical research associate at [employer].  His wife and step daughter are at the bedside. Therapy evals are pending.  We discussed his stroke diagnosis, ongoing work up and plan of care. We further discussed need to control stroke risk factors. He quite smoking 3 years ago, rarely misses taking medications but did forget his insulin on his recent trip. Wife helps with medication management.   Vitals:   06/01/20 0821 06/01/20 0825 06/01/20 0911 06/01/20 1155  BP:  121/74 125/82 108/74  Pulse:  95 79 75  Resp:  15 16 18   Temp: 98.1 F (36.7 C)  (!) 97.5 F (36.4 C) 97.9 F (36.6 C)  TempSrc: Oral  Oral Oral  SpO2:  99% 98% 97%  Weight:       CBC:  Recent Labs  Lab 05/31/20 1424 05/31/20 1430 06/01/20 0513  WBC 9.5  --  9.7  NEUTROABS 5.5  --   --   HGB 14.8 14.6 14.6  HCT 43.4 43.0 44.4  MCV 88.8  --  89.3  PLT 435*  --  643*   Basic Metabolic Panel:  Recent Labs  Lab 05/31/20 1424 05/31/20 1430 06/01/20 0513  NA 137 139 137  K 3.7 3.7 3.9  CL 102 103 107  CO2 26  --  24  GLUCOSE 101* 94 99  BUN 17 18 16   CREATININE 0.78 0.70 0.76  CALCIUM 8.8*  --  8.7*   Lipid Panel:  Recent Labs  Lab 06/01/20 0513  CHOL 118  TRIG 91  HDL 36*  CHOLHDL 3.3  VLDL 18  LDLCALC 64   HgbA1c: No results for input(s): HGBA1C in the last 168 hours. Urine Drug Screen:  Recent Labs  Lab 05/31/20 1710  LABOPIA NONE DETECTED  COCAINSCRNUR NONE DETECTED  LABBENZ NONE DETECTED  AMPHETMU NONE DETECTED  THCU NONE DETECTED  LABBARB NONE DETECTED    Alcohol Level No results for input(s): ETH in the last 168 hours.  IMAGING past 24 hours MR BRAIN WO CONTRAST  Result Date: 06/01/2020 CLINICAL DATA:  Stroke.   Follow-up. EXAM: MRI HEAD WITHOUT CONTRAST TECHNIQUE: Multiplanar, multiecho pulse sequences of the brain and surrounding structures were obtained without intravenous contrast. COMPARISON:  CT head without contrast, MR head without contrast, and CTA head and neck 05/31/2020. FINDINGS: Brain: Previously noted area of diffusion abnormality involving the perirolandic cortex is mostly resolved. A more discrete area of cortical infarction is present one sulci of more posterior in the right parietal lobe. T2 and FLAIR signal changes are present in the cortex of the better defined and more posterior infarct. There are no T2 or FLAIR changes in perirolandic cortex. Scattered periventricular and subcortical white matter changes are otherwise stable. The more confluent area adjacent to the atrium of the left lateral ventricle is stable. No acute hemorrhage or mass lesion is present. Ventricles are of normal size. No significant extra-axial fluid collection present. No additional acute infarcts are present. A remote lacunar infarct of the left cerebellum is again noted. Vascular: Flow is present in the major intracranial arteries. Skull and upper cervical spine: Focal disc disease is present at C3-4. Craniocervical junction is normal. Mild degenerative changes are present C1-2. Marrow signal  is otherwise normal. Sinuses/Orbits: Mild mucosal thickening is present in the maxillary sinuses and anterior ethmoid air cells bilaterally. The globes and orbits are within normal limits. IMPRESSION: 1. Previously noted area of diffusion abnormality involving the perirolandic deck cortex is mostly resolved. 2. More discrete area of new acute cortical infarction in the slightly more posterior right parietal lobe. 3. Remote lacunar infarct of the left cerebellum. 4. Stable atrophy and white matter disease otherwise likely reflects the sequela of chronic microvascular ischemia. 5. Mild sinus disease. Electronically Signed   By: San Morelle M.D.   On: 06/01/2020 15:43   PHYSICAL EXAM GENERAL: Awake, alert, in no acute distress Psych: Affect appropriate for situation, mildly anxious regarding all the testing Head: Normocephalic and atraumatic without obvious abnormality EENT: Normal conjunctivae, no OP obstruction LUNGS: Normal respiratory effort. Non-labored breathing CV: Regular rate on cardiac monitor, extremities without edema ABDOMEN - Soft, non-tender Ext: warm, well perfused, without obvious deformity  NEURO:  Mental Status: Awake, alert, and oriented to person, place, time, age, month, year, and situation. Patient is able to provide a clear and coherent history of present illness.  Speech/Language: speech is intact without dysarthria or aphasia.  Naming, repetition, fluency, and comprehension intact. No neglect is noted.  Cranial Nerves:  [name]: PERRL 3 mm / brisk. visual fields full.  III, IV, VI: EOMI without ptosis V: Sensation is intact to light touch and symmetrical to face.  VII: Face is symmetric resting and smiling.  VIII:  Sightly hard of hearing is intact to voice IX, X: Palate elevation is symmetric. Phonation normal.  XI: Normal sternocleidomastoid and trapezius muscle strength XII: Tongue protrudes midline without fasciculations.   Motor: 5/5 strength is all muscle groups without vertical drift on assessment. Right > left grip strength with some noted weakness of the left hand. Tone and bulk are normal.  Sensation: Symmetric and intact bilaterally in upper and lower extremities. RAMs, arm rolling and HKS intact bilaterally. No pronator drift.  Plantars: Toes downgoing bilaterally Gait: Deferred  ASSESSMENT/PLAN John [name] is a 60 y.o. male with a medical history significant for hypertension, hyperlipidemia, and type 2 diabetes without complication who presented to the ED today via EMS for evaluation of left hand numbness and weakness firs noticed after waking up from a nap. NIHSS 1.   tPA given per wake-up stroke protocol.   Acute cortical infarction in the slightly more posterior right parietal lobe possibly due to symptomatic right non-critical carotid stenosis given ulcerated appearance of the plaque or SVD with multiple stroke risk factors.     CT-scan of the brain: No evidence of acute intracranial abnormality. Mild cerebral white matter chronic small vessel ischemic disease. Small chronic infarct within the superior left cerebellar hemisphere.   CT Angio Head and Neck: No large vessel occlusion and neck. There is no proximal intracranial vessel occlusion. Probable attenuated right M4 MCA branch in proximity area of infarction. Plaque at the right greater than left proximal internal carotids causes less than 50% stenosis   MRI examination of the brain: Moderate size area of abnormal right frontoparietal diffusion involving the perirolandic region without significant corresponding T2 FLAIR hyperintensity. This may reflect hyperacute infarction. A punctate focus of susceptibility is region could reflect age-indeterminate microhemorrhage.Chronic microvascular ischemic changes. A more confluent area of T2 hyperintensity along the posterior left lateral ventricle likely represents the same process  24 hr Post tpA MRI Previously noted area of diffusion abnormality involving the perirolandic deck cortex is mostly resolved.  More discrete area of new acute cortical infarction in the slightly more posterior right parietal lobe. Remote lacunar infarct of the left cerebellum.Stable atrophy and white matter disease otherwise likely reflects the sequela of chronic microvascular ischemia.  2D Echo Pending   LDL 64  HgbA1c 7.9  VTE prophylaxis - SCDs    Diet   Diet heart healthy/carb modified Room service appropriate? Yes; Fluid consistency: Thin   No anticoagulants or antiplatelets prior to admission.   ASA 81mg  tonight and start DAPT 6/2 x 3 weeks then ASA 81mg   alone  Therapy recommendations:  Pending  Disposition: TBD. Likely home.   Hypertension  Home meds: Hyzaar 100/25  Stable currently  . BP goal less than 812 systolic  . 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 Humalog w/meals  HgbA1c 7.9, goal < 7.0  CBGs Recent Labs    05/31/20 1406 06/01/20 0741  GLUCAP 96 102*      SSI  Close PCP Follow up  Other Stroke Risk Factors  Former Cigarette smoker  Current ETOH use,   Overweight, Body mass index is 25.75 kg/m., BMI >/= 30 associated with increased stroke risk, recommend weight loss, diet and exercise as appropriate   Recent travel   Hx of stroke: remote cerebellar infarct on imaging   Other [facility] day # 1 John A Bailey-Modzik, NP-C  ATTENDING NOTE: I reviewed above note and agree with the assessment and plan. Pt was seen and examined.   60 year old male with history of hypertension, hyperlipidemia, diabetes presented to ER for left hand and arm weakness numbness.  CT no acute abnormality.  MRI concerning for early right MCA infarct without T2 flair changes, status post tPA for wake-up stroke protocol.  CTA head and neck showed right ICA bulb atherosclerosis but less than 50% stenosis.  MRI showed right frontal cortical small infarct, however not correlating to initial questionable early right MCA infarct.  2D echo pending.  LDL 64, A1c pending, UDS negative.  On exam, patient neurologically intact, no focal deficit.  Etiology for patient stroke not quite clear, concerning for occult cardioembolic source versus right ICA bulb high risk plaque.  Given patient not on any  antiplatelet treatment PTA, will do medical management at this time.  We will put on aspirin and Plavix DAPT for 3 weeks and then aspirin alone.  Continue Crestor 20 home dose.  Recommend 30-day cardiac event monitoring as outpatient to rule out A. fib.  Follow-up with vascular surgery for continued monitoring.  PT/OT pending.   Rosalin Hawking, MD PhD Stroke Neurology 06/01/2020 7:53 PM     To contact Stroke Continuity provider, please refer to [email or web address] After hours, contact General Neurology

## 2020-06-01 NOTE — Progress Notes (Signed)
  Echocardiogram 2D Echocardiogram has been performed.  John [name] John [name] 06/01/2020, 2:52 PM

## 2020-06-01 NOTE — Progress Notes (Signed)
Pt arrived on unit. This RN got him oriented to the unit. Bed is locked in the lowest position and ot has his call bell within reach.

## 2020-06-01 NOTE — Progress Notes (Signed)
  Echocardiogram 2D Echocardiogram has been attempted. Patient leaving OTF. Will reattempt at later time.  John [name] G [name] 06/01/2020, 8:56 AM

## 2020-06-01 NOTE — Progress Notes (Signed)
PT Cancellation Note  Patient Details Name: John [name] MRN: [id number] DOB: 1960-06-21   Cancelled Treatment:    Reason Eval/Treat Not Completed: Active bedrest order Patient received tPA at 1443 on 5/31. Currently on bedrest. PT will attempt eval after bed rest orders have been lifted.  Nneka Blanda A. Gilford Rile PT, DPT Acute Rehabilitation Services Pager [phone] Office [phone]    Linna Hoff 06/01/2020, 9:19 AM

## 2020-06-01 NOTE — Progress Notes (Signed)
OT Cancellation Note  Patient Details Name: NIKOLIS BERENT MRN: [id number] DOB: [birth year]   Cancelled Treatment:    Reason Eval/Treat Not Completed: Patient not medically ready.  Patient received tPA, active bedrest order, and follow stat MRI has not been completed yet.  Continue efforts as appropriate.    Kimya Mccahill D Ricca Melgarejo 06/01/2020, 2:07 PM

## 2020-06-02 ENCOUNTER — Other Ambulatory Visit (HOSPITAL_COMMUNITY): Payer: Self-pay

## 2020-06-02 ENCOUNTER — Encounter (HOSPITAL_COMMUNITY): Payer: Self-pay | Admitting: Neurology

## 2020-06-02 ENCOUNTER — Inpatient Hospital Stay (HOSPITAL_COMMUNITY): Payer: Managed Care, Other (non HMO)

## 2020-06-02 DIAGNOSIS — R9439 Abnormal result of other cardiovascular function study: Secondary | ICD-10-CM

## 2020-06-02 DIAGNOSIS — I255 Ischemic cardiomyopathy: Secondary | ICD-10-CM | POA: Diagnosis not present

## 2020-06-02 DIAGNOSIS — I63411 Cerebral infarction due to embolism of right middle cerebral artery: Principal | ICD-10-CM

## 2020-06-02 DIAGNOSIS — E7800 Pure hypercholesterolemia, unspecified: Secondary | ICD-10-CM | POA: Diagnosis not present

## 2020-06-02 DIAGNOSIS — I6389 Other cerebral infarction: Secondary | ICD-10-CM | POA: Diagnosis not present

## 2020-06-02 DIAGNOSIS — I639 Cerebral infarction, unspecified: Secondary | ICD-10-CM | POA: Diagnosis not present

## 2020-06-02 LAB — CBC WITH DIFFERENTIAL/PLATELET
Abs Immature Granulocytes: 0.07 10*3/uL (ref 0.00–0.07)
Basophils Absolute: 0.1 10*3/uL (ref 0.0–0.1)
Basophils Relative: 1 %
Eosinophils Absolute: 0.3 10*3/uL (ref 0.0–0.5)
Eosinophils Relative: 3 %
HCT: 43.5 % (ref 39.0–52.0)
Hemoglobin: 14.5 g/dL (ref 13.0–17.0)
Immature Granulocytes: 1 %
Lymphocytes Relative: 25 %
Lymphs Abs: 2.9 10*3/uL (ref 0.7–4.0)
MCH: 29.6 pg (ref 26.0–34.0)
MCHC: 33.3 g/dL (ref 30.0–36.0)
MCV: 88.8 fL (ref 80.0–100.0)
Monocytes Absolute: 0.9 10*3/uL (ref 0.1–1.0)
Monocytes Relative: 8 %
Neutro Abs: 7.5 10*3/uL (ref 1.7–7.7)
Neutrophils Relative %: 62 %
Platelets: 420 10*3/uL — ABNORMAL HIGH (ref 150–400)
RBC: 4.9 MIL/uL (ref 4.22–5.81)
RDW: 13.1 % (ref 11.5–15.5)
WBC: 11.7 10*3/uL — ABNORMAL HIGH (ref 4.0–10.5)
nRBC: 0 % (ref 0.0–0.2)

## 2020-06-02 LAB — BASIC METABOLIC PANEL
Anion gap: 9 (ref 5–15)
BUN: 16 mg/dL (ref 6–20)
CO2: 24 mmol/L (ref 22–32)
Calcium: 8.7 mg/dL — ABNORMAL LOW (ref 8.9–10.3)
Chloride: 104 mmol/L (ref 98–111)
Creatinine, Ser: 0.71 mg/dL (ref 0.61–1.24)
GFR, Estimated: >60 mL/min (ref >60)
Glucose, Bld: 119 mg/dL — ABNORMAL HIGH (ref 70–99)
Potassium: 3.9 mmol/L (ref 3.5–5.1)
Sodium: 137 mmol/L (ref 135–145)

## 2020-06-02 LAB — GLUCOSE, CAPILLARY
Glucose-Capillary: 161 mg/dL — ABNORMAL HIGH (ref 70–99)
Glucose-Capillary: 163 mg/dL — ABNORMAL HIGH (ref 70–99)

## 2020-06-02 LAB — HEMOGLOBIN A1C
Hgb A1c MFr Bld: 6.9 % — ABNORMAL HIGH (ref 4.8–5.6)
Mean Plasma Glucose: 151 mg/dL

## 2020-06-02 LAB — ECHOCARDIOGRAM LIMITED: Weight: 2790.4 oz

## 2020-06-02 MED ORDER — APIXABAN 5 MG PO TABS: 5.0000 mg | ORAL_TABLET | Freq: Two times a day (BID) | ORAL | 2 refills | Status: DC

## 2020-06-02 MED ORDER — ROSUVASTATIN CALCIUM 20 MG PO TABS: 40.0000 mg | ORAL_TABLET | Freq: Every day | ORAL | Status: DC

## 2020-06-02 MED ORDER — LOSARTAN POTASSIUM 25 MG PO TABS
25.0000 mg | ORAL_TABLET | Freq: Every day | ORAL | Status: DC
  Administered 2020-06-02: 25 mg via ORAL
  Filled 2020-06-02: qty 1

## 2020-06-02 MED ORDER — CARVEDILOL 3.125 MG PO TABS: 3.1250 mg | ORAL_TABLET | Freq: Two times a day (BID) | ORAL | 1 refills | Status: DC

## 2020-06-02 MED ORDER — ROSUVASTATIN CALCIUM 40 MG PO TABS: 40.0000 mg | ORAL_TABLET | Freq: Every day | ORAL | 1 refills | Status: DC

## 2020-06-02 MED ORDER — SENNOSIDES-DOCUSATE SODIUM 8.6-50 MG PO TABS: 1.0000 | ORAL_TABLET | Freq: Every evening | ORAL | Status: DC | PRN

## 2020-06-02 MED ORDER — PERFLUTREN LIPID MICROSPHERE
1.0000 mL | INTRAVENOUS | Status: AC | PRN
  Administered 2020-06-02: 5 mL via INTRAVENOUS
  Filled 2020-06-02: qty 10

## 2020-06-02 MED ORDER — CARVEDILOL 3.125 MG PO TABS
3.1250 mg | ORAL_TABLET | Freq: Two times a day (BID) | ORAL | Status: DC
  Administered 2020-06-02: 3.125 mg via ORAL
  Filled 2020-06-02: qty 1

## 2020-06-02 MED ORDER — LOSARTAN POTASSIUM 25 MG PO TABS: 25.0000 mg | ORAL_TABLET | Freq: Every day | ORAL | 1 refills | Status: DC

## 2020-06-02 MED ORDER — APIXABAN 5 MG PO TABS
5.0000 mg | ORAL_TABLET | Freq: Two times a day (BID) | ORAL | Status: DC
  Administered 2020-06-02: 5 mg via ORAL
  Filled 2020-06-02: qty 1

## 2020-06-02 NOTE — Progress Notes (Signed)
Pt pushed off unit by this RN. Daughter carried all of pt's belongings prior to the pt leaving off unit.

## 2020-06-02 NOTE — Plan of Care (Signed)

## 2020-06-02 NOTE — Evaluation (Signed)
Physical Therapy Evaluation & Discharge Patient Details Name: John [name] MRN: [id number] DOB: 28-Oct-1960 Today's Date: 06/02/2020   History of Present Illness  60 y/o male presented to ED on 5/31 with L hand numbness/tingling and weakness. Received tPA. MRI following tPA showed acute cortical infarction in posterior R parietal lobe, remote lacunar infarct of L cerebellum. PMH: HTN, HLD, type 2 DM  Clinical Impression  PTA, patient lives with wife and independent. Patient currently functioning at modI level for mobility with no AD. Patient with no apparent balance deficits. Patient states he is at baseline functioning. Educated patient on importance of physical activity. No further skilled PT needs required acutely. No PT follow up recommended at this time.      Follow Up Recommendations No PT follow up    Equipment Recommendations  None recommended by PT    Recommendations for Other Services       Precautions / Restrictions Precautions Precautions: None Restrictions Weight Bearing Restrictions: No      Mobility  Bed Mobility Overal bed mobility: Modified Independent                  Transfers Overall transfer level: Modified independent Equipment used: None                Ambulation/Gait Ambulation/Gait assistance: Modified independent (Device/Increase time) Gait Distance (Feet): 200 Feet Assistive device: None Gait Pattern/deviations: WFL(Within Functional Limits);Wide base of support   Gait velocity interpretation: >4.37 ft/sec, indicative of normal walking speed General Gait Details: "waddling" gait due to B knee arthritis  Stairs Stairs: Yes Stairs assistance: Modified independent (Device/Increase time) Stair Management: No rails;Step to pattern;Forwards Number of Stairs: 2    Wheelchair Mobility    Modified Rankin (Stroke Patients Only) Modified Rankin (Stroke Patients Only) Pre-Morbid Rankin Score: No symptoms Modified Rankin: No  symptoms     Balance Overall balance assessment: No apparent balance deficits (not formally assessed)                                           Pertinent Vitals/Pain Pain Assessment: No/denies pain    Home Living Family/patient expects to be discharged to:: Private residence Living Arrangements: Spouse/significant other Available Help at Discharge: Family;Available PRN/intermittently Type of Home: House Home Access: Stairs to enter Entrance Stairs-Rails: None Entrance Stairs-Number of Steps: 3 Home Layout: One level Home Equipment: None      Prior Function Level of Independence: Independent         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[employer], driving     [employer]        Extremity/Trunk Assessment   Upper Extremity Assessment Upper Extremity Assessment: Overall WFL for tasks assessed    Lower Extremity Assessment Lower Extremity Assessment: Overall WFL for tasks assessed (hx of B knee arthritis)       Communication   Communication: No difficulties  Cognition Arousal/Alertness: Awake/alert Behavior During Therapy: WFL for tasks assessed/performed Overall Cognitive Status: Within Functional Limits for tasks assessed                                        General Comments      Exercises     Assessment/Plan    PT Assessment Patent does not need any further PT services  PT Problem List  PT Treatment Interventions      PT Goals (Current goals can be found in the Care Plan section)  Acute Rehab PT Goals Patient Stated Goal: to go home PT Goal Formulation: All assessment and education complete, DC therapy    Frequency     Barriers to discharge        Co-evaluation               AM-PAC PT "6 Clicks" Mobility  Outcome Measure Help needed turning from your back to your side while in a flat bed without using bedrails?: None Help needed moving from lying on your back to sitting on the side of a flat  bed without using bedrails?: None Help needed moving to and from a bed to a chair (including a wheelchair)?: None Help needed standing up from a chair using your arms (e.g., wheelchair or bedside chair)?: None Help needed to walk in hospital room?: None Help needed climbing 3-5 steps with a railing? : None 6 Click Score: 24    End of Session Equipment Utilized During Treatment: Gait belt Activity Tolerance: Patient tolerated treatment well Patient left: in bed;with call bell/phone within reach;with bed alarm set Nurse Communication: Mobility status PT Visit Diagnosis: Unsteadiness on feet (R26.81);Muscle weakness (generalized) (M62.81)    Time: 2091-9802 PT Time Calculation (min) (ACUTE ONLY): 12 min   Charges:   PT Evaluation $PT Eval Low Complexity: 1 Low          Sire Poet A. Gilford Rile PT, DPT Acute Rehabilitation Services Pager [phone] Office [phone]   Linna Hoff 06/02/2020, 9:24 AM

## 2020-06-02 NOTE — Evaluation (Signed)
Speech Language Pathology Evaluation Patient Details Name: John [name] MRN: [id number] DOB: 23-Aug-1960 Today's Date: 06/02/2020 Time: 4259-5638 SLP Time Calculation (min) (ACUTE ONLY): 21.5 min  Problem List:  Patient Active Problem List   Diagnosis Date Noted  . Ischemic stroke ([place]) 05/31/2020  . Excessive cerumen in both ear canals 09/10/2019  . Essential hypertension 09/12/2017  . Elevated LDL cholesterol level 09/12/2017  . Diabetes mellitus ([place]) 09/12/2017  . Healthcare maintenance 09/12/2017   Past Medical History:  Past Medical History:  Diagnosis Date  . Arthritis   . Diabetes mellitus without complication ([place])   . Hyperlipidemia   . Hypertension    Past Surgical History:  Past Surgical History:  Procedure Laterality Date  . TONSILLECTOMY     HPI:  Pt is a 60 y/o male who presented to ED on 5/31 with L hand numbness/tingling and weakness. Received tPA. MRI following tPA showed acute cortical infarction in posterior R parietal lobe, remote lacunar infarct of L cerebellum. PMH: HTN, HLD, type 2 DM   Assessment / Plan / Recommendation Clinical Impression  Pt participated in speech/language/cognition evaluation. 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 Pt reported that he has some memory deficits and typically has to write important information down. Pt's speech and language skills were WNL. The John [name] Mental Status Examination was completed to evaluate the pt's cognitive-linguistic skills. He achieved a score of 19/30 which is below the normal limits of 25 or more out of 30. He exhibited difficulty in the areas of attention, memory, and executive function which were less significant during more functional informal tasks. Pt reported that he believes his cognition is currently at baseline. No family was present to corroborate these reports, but no overt cognitive deficits were observed during  the pt's physical therapy evaluation. Pt was educated regarding the potential impact of cognitive-linguistic impairments on his independence and the possibility of his participating in further assessment as an outpatient. However, pt was insistent that he believes that he is at baseline and he was therefore unwilling to initiate outpatient SLP services for fear of its cost. Pt ultimately agreed that he will follow up with his PCP if he notices increased difficulty and/or increased need for time performing activities which are more cognitively tasking. Acute SLP services will be discontinued at this time.    SLP Assessment  SLP Recommendation/Assessment: All further Speech Lanaguage Pathology  needs can be addressed in the next venue of care SLP Visit Diagnosis: Cognitive communication deficit (R41.841)    Follow Up Recommendations  None (Pt would like to defer outpatient SLP services at this time.)    Frequency and Duration           SLP Evaluation Cognition  Overall Cognitive Status: No family/caregiver present to determine baseline cognitive functioning (Pt reports that he believes he is at baseline) Arousal/Alertness: Awake/alert Orientation Level: Oriented to person;Oriented to place;Oriented to situation (Date: Wednesday June 02, 2020) Attention: Focused;Sustained Focused Attention: Appears intact Sustained Attention: Appears intact Memory: Impaired Memory Impairment: Retrieval deficit (Immediate: 5/5; delayed: 1/5) Awareness: Appears intact Problem Solving: Appears intact Executive Function: Organizing;Sequencing Sequencing: Impaired Sequencing Impairment: Verbal complex (clock drawing: 2/4) Organizing: Impaired Organizing Impairment: Verbal complex (backward digit span: 1/3)       Comprehension  Auditory Comprehension Overall Auditory Comprehension: Appears within functional limits for tasks assessed Yes/No Questions: Within Functional Limits Commands: Within Functional  Limits Conversation: Complex Visual Recognition/Discrimination Discrimination: Within  Function Limits    Expression Expression Primary Mode of Expression: Verbal Verbal Expression Overall Verbal Expression: Appears within functional limits for tasks assessed Initiation: No impairment Level of Generative/Spontaneous Verbalization: Conversation Repetition: No impairment   Oral / Motor  Oral Motor/Sensory Function Overall Oral Motor/Sensory Function: Within functional limits Motor Speech Overall Motor Speech: Appears within functional limits for tasks assessed Respiration: Within functional limits Phonation: Normal Resonance: Within functional limits Articulation: Within functional limitis Intelligibility: Intelligible Motor Planning: Witnin functional limits Motor Speech Errors: Not applicable   John [name], [place], [place] Office number [phone] Pager [place] 06/02/2020, 12:49 PM

## 2020-06-02 NOTE — Discharge Instructions (Addendum)
Please make an appointment by calling today to be seein in 1-2 weeks at your primary doctor's office for hospital follow up.  You will be contacted with an appointment to follow up with cardiology and neurology teams.    Information on my medicine - ELIQUIS (apixaban)  Why was Eliquis prescribed for you? Eliquis was prescribed for you to reduce the risk of a blood clot forming that can cause a stroke if you have a medical condition called atrial fibrillation (a type of irregular heartbeat).  What do You need to know about Eliquis ? Take your Eliquis TWICE DAILY - one tablet in the morning and one tablet in the evening with or without food. If you have difficulty swallowing the tablet whole please discuss with your pharmacist how to take the medication safely.  Take Eliquis exactly as prescribed by your doctor and DO NOT stop taking Eliquis without talking to the doctor who prescribed the medication.  Stopping may increase your risk of developing a stroke.  Refill your prescription before you run out.  After discharge, you should have regular check-up appointments with your healthcare provider that is prescribing your Eliquis.  In the future your dose may need to be changed if your kidney function or weight changes by a significant amount or as you get older.  What do you do if you miss a dose? If you miss a dose, take it as soon as you remember on the same day and resume taking twice daily.  Do not take more than one dose of ELIQUIS at the same time to make up a missed dose.  Important Safety Information A possible side effect of Eliquis is bleeding. You should call your healthcare provider right away if you experience any of the following: ? Bleeding from an injury or your nose that does not stop. ? Unusual colored urine (red or dark brown) or unusual colored stools (red or black). ? Unusual bruising for unknown reasons. ? A serious fall or if you hit your head (even if there is no  bleeding).  Some medicines may interact with Eliquis and might increase your risk of bleeding or clotting while on Eliquis. To help avoid this, consult your healthcare provider or pharmacist prior to using any new prescription or non-prescription medications, including herbals, vitamins, non-steroidal anti-inflammatory drugs (NSAIDs) and supplements.  This website has more information on Eliquis (apixaban): [email or web address]

## 2020-06-02 NOTE — Discharge Summary (Addendum)
Stroke Discharge Summary  Patient ID: John [name]   MRN: [id number]      DOB: 1960-04-24  Date of Admission: 05/31/2020 Date of Discharge: 06/02/2020  Attending Physician:  Dr. Rosalin Hawking Consultant(s):    Cardiology, [name]  Patient's PCP:  Libby Maw, MD  DISCHARGE DIAGNOSIS: 1.Acute cortical infarction in the slightly more posterior right parietal lobe possibly due to symptomatic right non-critical carotid stenosis given ulcerated appearance of the plaque or SVD with multiple stroke risk factors.   2. Ischemic cardiomyopathy, new diagnosis 3. Abnormal echocardiogarm with wall motion abnormalities, moderate LV dysfunction, EF 30% 4. Probable CAD 5. HTN 6. HLD 7. Long term anticoagulant therapy intiation with Eliquis  8. 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   carvedilol 3.125 MG tablet Commonly known as: COREG Take 1 tablet (3.125 mg total) by mouth 2 (two) times daily with a meal.   Debrox 6.5 % OTIC solution Generic drug: carbamide peroxide Place 5 drops into both ears 2 (two) times daily.   Farxiga 10 MG Tabs tablet Generic drug: dapagliflozin propanediol TAKE ONE TABLET BY MOUTH DAILY   FreeStyle Libre 14 Day Sensor Misc 1 Device by Does not apply route every 14 (fourteen) days.   glucose blood test strip 1 each by Other route 2 (two) times daily. And lancets 2/day   insulin lispro 100 UNIT/ML KwikPen Commonly known as: HumaLOG KwikPen Inject 0.05-0.1 mLs (5-10 Units total) into the skin 3 (three)  times daily with meals. And pen needles 4/day   losartan 25 MG tablet Commonly known as: COZAAR Take 1 tablet (25 mg total) by mouth daily.   metFORMIN 500 MG 24 hr tablet Commonly known as: GLUCOPHAGE-XR Take 1 tablet (500 mg total) by mouth 2 (two) times daily.   Pen Needles 32G X 4 MM Misc 1 Package by Does not apply route 4 (four) times daily.   rosuvastatin 40 MG tablet Commonly known as: Crestor Take 1 tablet (40 mg total) by mouth daily. Start taking on: June 03, 2020 What changed:   medication strength  how much to take   senna-docusate 8.6-50 MG tablet Commonly known as: Senokot-S Take 1 tablet by mouth at bedtime as needed for mild constipation.   sildenafil 100 MG tablet Commonly known as: VIAGRA TAKE ONE TABLET BY MOUTH DAILY AS NEEDED FOR ERECTILE DYSFUNCTION What changed: See the new instructions.   Tyler Aas FlexTouch 100 UNIT/ML FlexTouch Pen Generic drug: insulin degludec Inject 40 Units into the skin daily.   Trulicity 1.5 BL/3.9QZ Sopn Generic drug: Dulaglutide Inject 1.5 mg into the skin once a week. What changed: additional instructions      LABORATORY STUDIES CBC    Component Value Date/Time   WBC 11.7 (H) 06/02/2020 0212   RBC 4.90 06/02/2020 0212   HGB 14.5 06/02/2020 0212   HCT 43.5 06/02/2020 0212   PLT 420 (H) 06/02/2020 0212   MCV 88.8 06/02/2020 0212   MCH 29.6 06/02/2020 0212  MCHC 33.3 06/02/2020 0212   RDW 13.1 06/02/2020 0212   LYMPHSABS 2.9 06/02/2020 0212   MONOABS 0.9 06/02/2020 0212   EOSABS 0.3 06/02/2020 0212   BASOSABS 0.1 06/02/2020 0212   CMP    Component Value Date/Time   NA 137 06/02/2020 0212   K 3.9 06/02/2020 0212   CL 104 06/02/2020 0212   CO2 24 06/02/2020 0212   GLUCOSE 119 (H) 06/02/2020 0212   BUN 16 06/02/2020 0212   CREATININE 0.71 06/02/2020 0212   CALCIUM 8.7 (L) 06/02/2020 0212   PROT 6.4 (L) 06/01/2020 0513   ALBUMIN 3.3 (L) 06/01/2020 0513   AST 19 06/01/2020 0513   ALT 18 06/01/2020  0513   ALKPHOS 65 06/01/2020 0513   BILITOT 0.9 06/01/2020 0513   GFRNONAA >60 06/02/2020 0212   COAGS Lab Results  Component Value Date   INR 1.0 05/31/2020   Lipid Panel    Component Value Date/Time   CHOL 118 06/01/2020 0513   TRIG 91 06/01/2020 0513   HDL 36 (L) 06/01/2020 0513   CHOLHDL 3.3 06/01/2020 0513   VLDL 18 06/01/2020 0513   LDLCALC 64 06/01/2020 0513   HgbA1C  Lab Results  Component Value Date   HGBA1C 6.9 (H) 06/01/2020   Urinalysis    Component Value Date/Time   COLORURINE YELLOW 05/31/2020 1711   APPEARANCEUR CLEAR 05/31/2020 1711   LABSPEC 1.037 (H) 05/31/2020 1711   PHURINE 6.0 05/31/2020 1711   GLUCOSEU >=500 (A) 05/31/2020 1711   GLUCOSEU >=1000 (A) 05/03/2020 0849   HGBUR SMALL (A) 05/31/2020 1711   BILIRUBINUR NEGATIVE 05/31/2020 1711   KETONESUR 5 (A) 05/31/2020 1711   PROTEINUR NEGATIVE 05/31/2020 1711   UROBILINOGEN 0.2 05/03/2020 0849   NITRITE NEGATIVE 05/31/2020 1711   LEUKOCYTESUR NEGATIVE 05/31/2020 1711   Urine Drug Screen     Component Value Date/Time   LABOPIA NONE DETECTED 05/31/2020 1710   COCAINSCRNUR NONE DETECTED 05/31/2020 1710   LABBENZ NONE DETECTED 05/31/2020 1710   AMPHETMU NONE DETECTED 05/31/2020 1710   THCU NONE DETECTED 05/31/2020 1710   LABBARB NONE DETECTED 05/31/2020 1710    Alcohol Level No results found for: [facility]   SIGNIFICANT DIAGNOSTIC STUDIES MR BRAIN WO CONTRAST  Result Date: 06/01/2020 CLINICAL DATA:  Stroke.  Follow-up. EXAM: MRI HEAD WITHOUT CONTRAST TECHNIQUE: Multiplanar, multiecho pulse sequences of the brain and surrounding structures were obtained without intravenous contrast. COMPARISON:  CT head without contrast, MR head without contrast, and CTA head and neck 05/31/2020. FINDINGS: Brain: Previously noted area of diffusion abnormality involving the perirolandic cortex is mostly resolved. A more discrete area of cortical infarction is present one sulci of more posterior in the right parietal  lobe. T2 and FLAIR signal changes are present in the cortex of the better defined and more posterior infarct. There are no T2 or FLAIR changes in perirolandic cortex. Scattered periventricular and subcortical white matter changes are otherwise stable. The more confluent area adjacent to the atrium of the left lateral ventricle is stable. No acute hemorrhage or mass lesion is present. Ventricles are of normal size. No significant extra-axial fluid collection present. No additional acute infarcts are present. A remote lacunar infarct of the left cerebellum is again noted. Vascular: Flow is present in the major intracranial arteries. Skull and upper cervical spine: Focal disc disease is present at C3-4. Craniocervical junction is normal. Mild degenerative changes are present C1-2. Marrow signal is otherwise normal. Sinuses/Orbits: Mild mucosal thickening is present in the maxillary sinuses and anterior ethmoid  air cells bilaterally. The globes and orbits are within normal limits. IMPRESSION: 1. Previously noted area of diffusion abnormality involving the perirolandic deck cortex is mostly resolved. 2. More discrete area of new acute cortical infarction in the slightly more posterior right parietal lobe. 3. Remote lacunar infarct of the left cerebellum. 4. Stable atrophy and white matter disease otherwise likely reflects the sequela of chronic microvascular ischemia. 5. Mild sinus disease. Electronically Signed   By: San Morelle M.D.   On: 06/01/2020 15:43   MR BRAIN WO CONTRAST  Result Date: 05/31/2020 CLINICAL DATA:  Left-sided hand weakness EXAM: MRI HEAD WITHOUT CONTRAST TECHNIQUE: Multiplanar, multiecho pulse sequences of the brain and surrounding structures were obtained without intravenous contrast. COMPARISON:  None. FINDINGS: DWI, SWI, and T2 FLAIR sequences were obtained as part of a stroke protocol. There is a moderate size area of right frontoparietal diffusion hyperintensity with mild ADC  isointensity. There is no substantial corresponding T2 FLAIR hyperintensity. This involves the perirolandic region. A punctate focus of susceptibility is present in this region may reflect microhemorrhage or mineralization. Additional patchy foci of T2 hyperintensity in the supratentorial white matter nonspecific but may reflect mild to moderate chronic microvascular ischemic changes. This includes a more confluent area along the posterior left lateral ventricle. No mass effect. No hydrocephalus or extra-axial collection. In addition to above, there is also a punctate area susceptibility along the right lateral aspect of the midbrain also reflecting chronic microhemorrhage or mineralization. IMPRESSION: Moderate size area of abnormal right frontoparietal diffusion involving the perirolandic region without significant corresponding T2 FLAIR hyperintensity. This may reflect hyperacute infarction. A punctate focus of susceptibility is region could reflect age-indeterminate microhemorrhage. Chronic microvascular ischemic changes. A more confluent area of T2 hyperintensity along the posterior left lateral ventricle likely represents the same process, but a follow-up study in 1 year is suggested as an infiltrating neoplasm could have a similar appearance. Initial results were discussed by telephone at the time of interpretation on 05/31/2020 at 2:55 pm with provider Lesleigh Noe , who verbally acknowledged these results. Electronically Signed   By: Macy Mis M.D.   On: 05/31/2020 15:01   ECHOCARDIOGRAM COMPLETE  Result Date: 06/01/2020    ECHOCARDIOGRAM REPORT   Patient Name:   John [name] Date of Exam: 06/01/2020 Medical Rec #:  [id number]          Height:       69.0 in Accession #:    [id number]         Weight:       174.4 lb Date of Birth:  1960-10-10          BSA:          1.949 m Patient Age:    59 years           BP:           121/74 mmHg Patient Gender: M                  HR:           86 bpm. Exam  Location:  Inpatient Procedure: 2D Echo, Cardiac Doppler and Color Doppler Indications:    Stroke I63.9  History:        Patient has no prior history of Echocardiogram examinations.                 Risk Factors:Hypertension, Diabetes and Dyslipidemia.  Sonographer:    Tiffany Dance Referring Phys: [id number] [place]  1. Echogenic region in the LV lateral apex may represent mural thrombus vs prominent trabeculations in setting of apical regional wall motion abnormalities. Recommend limited echo with Definity contrast to exclude apical mural thrombus.  2. Left ventricular ejection fraction, by estimation, is 40 to 45%. The left ventricle has mildly decreased function. The left ventricle demonstrates regional wall motion abnormalities (see scoring diagram/findings for description). There is moderate eccentric left ventricular hypertrophy of the posterior-lateral segment. Left ventricular diastolic parameters are consistent with Grade I diastolic dysfunction (impaired relaxation).  3. Right ventricular systolic function is normal. The right ventricular size is normal. There is normal pulmonary artery systolic pressure. The estimated right ventricular systolic pressure is 09.4 mmHg.  4. The mitral valve is normal in structure. Trivial mitral valve regurgitation. No evidence of mitral stenosis.  5. The aortic valve is tricuspid. Aortic valve regurgitation is not visualized. No aortic stenosis is present.  6. The inferior vena cava is normal in size with greater than 50% respiratory variability, suggesting right atrial pressure of 3 mmHg. FINDINGS  Left Ventricle: Left ventricular ejection fraction, by estimation, is 40 to 45%. The left ventricle has mildly decreased function. The left ventricle demonstrates regional wall motion abnormalities. The left ventricular internal cavity size was normal in size. There is moderate eccentric left ventricular hypertrophy of the posterior-lateral segment. Left  ventricular diastolic parameters are consistent with Grade I diastolic dysfunction (impaired relaxation).  LV Wall Scoring: The mid and distal anterior septum, mid inferoseptal segment, and apical inferior segment are akinetic. The apical lateral segment and apical anterior segment are hypokinetic. Right Ventricle: The right ventricular size is normal. No increase in right ventricular wall thickness. Right ventricular systolic function is normal. There is normal pulmonary artery systolic pressure. The tricuspid regurgitant velocity is 2.02 m/s, and  with an assumed right atrial pressure of 3 mmHg, the estimated right ventricular systolic pressure is 70.9 mmHg. Left Atrium: Left atrial size was normal in size. Right Atrium: Right atrial size was normal in size. Pericardium: There is no evidence of pericardial effusion. Mitral Valve: The mitral valve is normal in structure. Trivial mitral valve regurgitation. No evidence of mitral valve stenosis. Tricuspid Valve: The tricuspid valve is normal in structure. Tricuspid valve regurgitation is trivial. No evidence of tricuspid stenosis. Aortic Valve: The aortic valve is tricuspid. Aortic valve regurgitation is not visualized. No aortic stenosis is present. Pulmonic Valve: The pulmonic valve was normal in structure. Pulmonic valve regurgitation is trivial. No evidence of pulmonic stenosis. Aorta: The aortic root is normal in size and structure. Venous: The inferior vena cava is normal in size with greater than 50% respiratory variability, suggesting right atrial pressure of 3 mmHg. IAS/Shunts: No atrial level shunt detected by color flow Doppler.  LEFT VENTRICLE PLAX 2D LVIDd:         4.90 cm  Diastology LVIDs:         3.50 cm  LV e' medial:    5.77 cm/s LV PW:         1.50 cm  LV E/e' medial:  11.7 LV IVS:        1.20 cm  LV e' lateral:   5.77 cm/s LVOT diam:     1.90 cm  LV E/e' lateral: 11.7 LV SV:         52 LV SV Index:   27 LVOT Area:     2.84 cm  RIGHT VENTRICLE              IVC  RV Basal diam:  2.10 cm     IVC diam: 2.00 cm RV S prime:     14.60 cm/s TAPSE (M-mode): 1.8 cm LEFT ATRIUM             Index       RIGHT ATRIUM           Index LA diam:        4.00 cm 2.05 cm/m  RA Area:     10.00 cm LA Vol (A2C):   57.9 ml 29.70 ml/m RA Volume:   18.70 ml  9.59 ml/m LA Vol (A4C):   58.8 ml 30.17 ml/m LA Biplane Vol: 58.5 ml 30.01 ml/m  AORTIC VALVE LVOT Vmax:   114.00 cm/s LVOT Vmean:  68.500 cm/s LVOT VTI:    0.184 m  AORTA Ao Root diam: 3.60 cm Ao Asc diam:  3.80 cm MITRAL VALVE               TRICUSPID VALVE MV Area (PHT): 2.83 cm    TR Peak grad:   16.3 mmHg MV Decel Time: 268 msec    TR Vmax:        202.00 cm/s MV E velocity: 67.30 cm/s MV A velocity: 97.30 cm/s  SHUNTS MV E/A ratio:  0.69        Systemic VTI:  0.18 m                            Systemic Diam: 1.90 cm Cherlynn Kaiser MD Electronically signed by Cherlynn Kaiser MD Signature Date/Time: 06/01/2020/9:55:27 PM    Final    CT HEAD CODE STROKE WO CONTRAST  Result Date: 05/31/2020 CLINICAL DATA:  Code stroke. Last known normal 1330, left-sided weakness. EXAM: CT HEAD WITHOUT CONTRAST TECHNIQUE: Contiguous axial images were obtained from the base of the skull through the vertex without intravenous contrast. COMPARISON:  No pertinent prior exams available for comparison. FINDINGS: Brain: Cerebral volume is normal for age. Mild patchy and ill-defined hypoattenuation within the cerebral white matter, nonspecific but compatible with chronic small vessel ischemic disease. Small chronic infarct within the superior left cerebellum (series 5, image 49) (series 2, image 12). There is no acute intracranial hemorrhage. No demarcated cortical infarct. No extra-axial fluid collection. No evidence of intracranial mass. No midline shift. Vascular: No hyperdense vessel.  Atherosclerotic calcifications Skull: Normal. Negative for fracture or focal lesion. Sinuses/Orbits: Visualized orbits show no acute finding. Mild bilateral  ethmoid and maxillary sinus mucosal thickening. ASPECTS [facility] Stroke Program Early CT Score) - Ganglionic level infarction (caudate, lentiform nuclei, internal capsule, insula, M1-M3 cortex): 7 - Supraganglionic infarction (M4-M6 cortex): 3 Total score (0-10 with 10 being normal): 10 These results were communicated to Dr. Curly Shores at 2:25 pmon 5/31/2022by text page via the [facility] messaging system. IMPRESSION: No evidence of acute intracranial abnormality.  ASPECTS is 10. Mild cerebral white matter chronic small vessel ischemic disease. Small chronic infarct within the superior left cerebellar hemisphere. Mild paranasal sinus disease, as described. Electronically Signed   By: Kellie Simmering DO   On: 05/31/2020 14:26   ECHOCARDIOGRAM LIMITED  Result Date: 06/02/2020    ECHOCARDIOGRAM LIMITED REPORT   Patient Name:   John [name] Date of Exam: 06/02/2020 Medical Rec #:  [id number]          Height:       69.0 in Accession #:    [id number]         Weight:  174.4 lb Date of Birth:  1960/03/25          BSA:          1.949 m Patient Age:    60 years           BP:           125/86 mmHg Patient Gender: M                  HR:           83 bpm. Exam Location:  Inpatient Procedure: 2D Echo Indications:    Stroke  History:        Patient has prior history of Echocardiogram examinations, most                 recent 06/01/2020. Risk Factors:Diabetes, Hypertension and                 Dyslipidemia.  Sonographer:    Cammy Brochure Referring Phys: [id number] [place]  1. Left ventricular ejection fraction, by estimation, is 30 to 35%. The left ventricle has moderately decreased function. The left ventricle demonstrates regional wall motion abnormalities (see scoring diagram/findings for description). There is akinesis of the left ventricular, mid inferoseptal wall and anteroseptal wall. There is akinesis of the left ventricular, apical septal wall, anterior wall and inferior wall. There is akinesis of  the left ventricular, apical segment. FINDINGS  Left Ventricle: Left ventricular ejection fraction, by estimation, is 30 to 35%. The left ventricle has moderately decreased function. The left ventricle demonstrates regional wall motion abnormalities. Fransico Him MD Electronically signed by Fransico Him MD Signature Date/Time: 06/02/2020/9:18:00 AM    Final    CT ANGIO HEAD CODE STROKE  Result Date: 05/31/2020 CLINICAL DATA:  Code stroke follow-up, abnormal MRI EXAM: CT ANGIOGRAPHY HEAD AND NECK TECHNIQUE: Multidetector CT imaging of the head and neck was performed using the standard protocol during bolus administration of intravenous contrast. Multiplanar CT image reconstructions and MIPs were obtained to evaluate the vascular anatomy. Carotid stenosis measurements (when applicable) are obtained utilizing NASCET criteria, using the distal internal carotid diameter as the denominator. CONTRAST:  20mL OMNIPAQUE IOHEXOL 350 MG/ML SOLN COMPARISON:  None. FINDINGS: CTA NECK FINDINGS Aortic arch: Mixed plaque along the aortic arch and patent great vessel origins. There is eccentric noncalcified plaque along the proximal left subclavian causing less than 50% stenosis. Right carotid system: Patent. Mixed plaque at the proximal ICA causes less than 50% stenosis. Left carotid system: Patent. Mixed but primarily calcified plaque at the proximal ICA causes less than 50% stenosis. Vertebral arteries: Patent and codominant.  No stenosis. Skeleton: Degenerative changes of the cervical spine. Other neck: Unremarkable. Upper chest: No apical lung mass. Review of the MIP images confirms the above findings CTA HEAD FINDINGS Anterior circulation: Intracranial internal carotid arteries are patent with mixed plaque causing up to moderate stenosis on the left at the level of the distal cavernous segment. Right M1 and proximal M2 MCA branches are patent. There is a probable attenuated right M4 branch in proximity to the area of  infarction; difficult to separate from venous enhancement due to contrast timing. Left middle and both anterior cerebral arteries are patent. Anterior communicating artery is present. Posterior circulation: Intracranial vertebral arteries are patent. Left vertebral artery becomes diminutive after PICA origin. There is a small caliber proximal basilar artery. 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 Major cerebellar artery  origins are patent. Posterior cerebral arteries are patent. A left posterior communicating artery is present with fetal origin of the left PCA. Probable small right posterior communicating artery. Venous sinuses: As permitted by contrast timing, patent. Review of the MIP images confirms the above findings IMPRESSION: No large vessel occlusion and neck. There is no proximal intracranial vessel occlusion. Probable attenuated right M4 MCA branch in proximity area of infarction. Plaque at the right greater than left proximal internal carotids causes less than 50% stenosis. 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 Electronically Signed   By: Macy Mis M.D.   On: 05/31/2020 15:28   CT ANGIO NECK CODE STROKE  Result Date: 05/31/2020 CLINICAL DATA:  Code stroke follow-up, abnormal MRI EXAM: CT ANGIOGRAPHY HEAD AND NECK TECHNIQUE: Multidetector CT imaging of the head and neck was performed using the standard protocol during bolus administration of intravenous contrast. Multiplanar CT image reconstructions and MIPs were obtained to evaluate the vascular anatomy. Carotid stenosis measurements (when applicable) are obtained utilizing NASCET criteria, using the distal internal carotid diameter as the denominator. CONTRAST:  54mL OMNIPAQUE IOHEXOL 350 MG/ML SOLN COMPARISON:  None. FINDINGS: CTA NECK FINDINGS Aortic arch: Mixed plaque along the aortic arch and patent great vessel origins. There is eccentric  noncalcified plaque along the proximal left subclavian causing less than 50% stenosis. Right carotid system: Patent. Mixed plaque at the proximal ICA causes less than 50% stenosis. Left carotid system: Patent. Mixed but primarily calcified plaque at the proximal ICA causes less than 50% stenosis. Vertebral arteries: Patent and codominant.  No stenosis. Skeleton: Degenerative changes of the cervical spine. Other neck: Unremarkable. Upper chest: No apical lung mass. Review of the MIP images confirms the above findings CTA HEAD FINDINGS Anterior circulation: Intracranial internal carotid arteries are patent with mixed plaque causing up to moderate stenosis on the left at the level of the distal cavernous segment. Right M1 and proximal M2 MCA branches are patent. There is a probable attenuated right M4 branch in proximity to the area of infarction; difficult to separate from venous enhancement due to contrast timing. Left middle and both anterior cerebral arteries are patent. Anterior communicating artery is present. Posterior circulation: Intracranial vertebral arteries are patent. Left vertebral artery becomes diminutive after PICA origin. There is a small caliber proximal basilar artery. 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 Major cerebellar artery origins are patent. Posterior cerebral arteries are patent. A left posterior communicating artery is present with fetal origin of the left PCA. Probable small right posterior communicating artery. Venous sinuses: As permitted by contrast timing, patent. Review of the MIP images confirms the above findings IMPRESSION: No large vessel occlusion and neck. There is no proximal intracranial vessel occlusion. Probable attenuated right M4 MCA branch in proximity area of infarction. Plaque at the right greater than left proximal internal carotids causes less than 50% stenosis. 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 Electronically Signed   By: Macy Mis M.D.   On: 05/31/2020 15:28   HISTORY OF PRESENT ILLNESS 60 year old male with history of hypertension, hyperlipidemia, diabetes presented to ER for left hand and arm weakness numbness.  CT no acute abnormality.  MRI concerning for early right MCA infarct without T2 flair changes, status post tPA for wake-up stroke protocol.  CTA head and neck showed right ICA bulb atherosclerosis but less than 50% stenosis.  MRI showed right frontal cortical small infarct, however  not correlating to initial questionable early right MCA infarct  [facility] problem list:  Acute cortical infarction in the slightly more posterior right parietal lobe possibly due to  new diagnosed cardiomyopathy, less likely right carotid atherosclerosis     CT-scan of the brain: No evidence of acute intracranial abnormality. Mild cerebral white matter chronic small vessel ischemic disease. Small chronic infarct within the superior left cerebellar hemisphere.   CT Angio Head and Neck: No large vessel occlusion and neck. There is no proximal intracranial vessel occlusion. Probable attenuated right M4 MCA branch in proximity area of infarction. Plaque at the right greater than left proximal internal carotids causes less than 50% stenosis   MRI examination of the brain: Moderate size area of abnormal right frontoparietal diffusion involving the perirolandic region without significant corresponding T2 FLAIR hyperintensity. This may reflect hyperacute infarction. A punctate focus of susceptibility is region could reflect age-indeterminate microhemorrhage.Chronic microvascular ischemic changes. A more confluent area of T2 hyperintensity along the posterior left lateral ventricle likely represents the same process  24 hr Post tpA MRI Previously noted area of diffusion abnormality involving the perirolandic deck cortex is mostly  resolved. More discrete area of new acute cortical infarction in the slightly more posterior right parietal lobe. Remote lacunar infarct of the left cerebellum.Stable atrophy and white matter disease otherwise likely reflects the sequela of chronic microvascular ischemia.  2D Echo and Limited with Definity abnormal as below  LDL 64  HgbA1c Pending as send out  VTE prophylaxis - SCDs  No anticoagulants or antiplatelets prior to admission.   Anticoagulation with Eliquis recommended by cariology as below in the setting of new cardiomyopathy  Therapy recommendations:  Pending  Disposition: TBD. Likely home.   Cardiomyopathy Probable CAD  EF 30% on limited Echo with Definity  distal septal and apical wall motion abnormality with moderate LV dysfunction  swirling at the apex with increased risk for thrombus formation  Cardiology consulted and recommended Eliquis and will likely need cardiac cath in 8 weeks  low-dose carvedilol at 3.125 mg twice daily and losartan 25 mg daily.    Close cardiology follow up  Hypertension  Home meds: Hyzaar 100/25  Stable currently   BP goal less than 169 systolic   Long-term BP goal normotensive  Hyperlipidemia  Home meds: Crestor 20mg   LDL 64, at goal < 70  Continue high intensity statin at discharge  Diabetes type [name] Uncontrolled  Home meds: Zollie Beckers, Tresiba, Metformin. 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 rate 18,  weight 79.1 kg, SpO2 98 %.  GENERAL: Awake, alert, in no acute distress Psych: Affect appropriate for situation, mildly anxious regarding all the testing Head: Normocephalic and atraumaticwithout obvious abnormality EENT: Normal conjunctivae, no OP obstruction LUNGS:Normal respiratory effort. Non-labored breathing CV:ELFYBOF rateon cardiac monitor, extremities without edema ABDOMEN - Soft, non-tender Ext: warm, well perfused, without obvious deformity  NEURO:  Mental Status:Awake, alert, and oriented to person, place, time, age, month, year, and situation. Patient  is able to provide a clear and coherent history of present illness. Speech/Language: speech isintact without dysarthria or aphasia. Naming, repetition, fluency, and comprehension intact. No neglect is noted. Cranial Nerves:  [name]: PERRL38mm / brisk. visual fields full.  III, IV, VI: EOMIwithout ptosis V: Sensation is intact to light touch and symmetrical to face.  VII: Face is symmetric resting and smiling.  VIII:  Sightly hard of hearingisintact to voice IX, X: Palate elevation is symmetric. Phonation normal.  XI: Normal sternocleidomastoid and trapezius muscle strength XII: Tongue protrudes midline without fasciculations.  Motor: 5/5 strength is all muscle groupswithout vertical drift on assessment. Right > left grip strength with some noted weakness of the left hand. Tone and bulk are normal. Sensation:Symmetric and intact bilaterally in upper and lower extremities. RAMs, arm rolling and HKS intact bilaterally. No pronator drift.  Plantars:Toes downgoing bilaterally  Discharge Diet       Diet   Diet heart healthy/carb modified Room service appropriate? Yes; Fluid consistency: Thin   liquids  DISCHARGE PLAN  Disposition:  Home   Eliquis therapy initiated   Ongoing stroke risk factor control by Primary Care Physician at time of discharge. Wife instructed to call today to arrange hospital follow up  within 1-2 weeks.   Follow-up PCP Libby Maw, MD in 2 weeks.  Follow-up in [facility] in 4 weeks, office to schedule an appointment.  Cardiology follow up arranged by [name]  I discussed the limited Echo findings and cardiology consult recommendations and new medications with Mrs. Gordy Savers by phone. She manages patient's health issues, medications, appointments and does all the cooking. We discussed the discharge plan of care including follow up appointment plan. All her questions were answered.   40 minutes were spent preparing discharge.  Delila A Bailey-Modzik, NP-C   ATTENDING NOTE: I reviewed above note and agree with the assessment and plan. Pt was seen and examined.   Patient no event overnight, neuro stable, no deficit.  However, 2D echo showed EF 30 to 35%, although no apical thrombus but there is swirling and low flow state at the apex with increased risk of thrombus formation.  His etiology of her stroke now most likely due to cardiomyopathy, less likely right carotid source.  Cardiology [name] recommend evaluation with Eliquis 5 mg twice daily which I totally agree.  Will DC aspirin and Plavix.  Continue Crestor.  He will follow-up with cardiology as outpatient.  PT/OT no recommendations.  He will follow-up with stroke neurology clinic in 4 weeks.  Ready for discharge home.  For detailed assessment and plan, please refer to above as I have made changes wherever appropriate.   Rosalin Hawking, MD PhD Stroke Neurology 06/02/2020 5:36 PM

## 2020-06-02 NOTE — TOC Transition Note (Signed)
Transition of Care [facility]) - CM/SW Discharge Note   Patient Details  Name: John [name] MRN: [id number] Date of Birth: August 25, 1960  Transition of Care [facility]) CM/SW Contact:  Pollie Friar, RN Phone Number: 06/02/2020, 2:51 PM   Clinical Narrative:    Patient is discharging home with self care. No f/u per PT. Pt provided 30 day free card for Eliquis. Pt has $10 copay card for eliquis that pharmacy supplied.  Pt states he has supervision at home and transportation to home.   Final next level of care: Home/Self Care Barriers to Discharge: No Barriers Identified   Patient Goals and CMS Choice        Discharge Placement                       Discharge Plan and Services                                     Social Determinants of Health (SDOH) Interventions     Readmission Risk Interventions No flowsheet data found.

## 2020-06-02 NOTE — Consult Note (Signed)
Cardiology Consultation:   Patient ID: TIYON [name] MRN: [id number]; DOB: 24-Oct-1960  Admit date: 05/31/2020 Date of Consult: 06/02/2020  PCP:  Libby Maw, MD   [facility] Providers Cardiologist:  [facility] Stanford Breed   Patient Profile:   John [name] is a 60 y.o. male with a hx of diabetes mellitus, hypertension, hyperlipidemia, recent CVA who is being seen 06/02/2020 for the evaluation of ischemic cardiomyopathy at the request of Rosalin Hawking MD.  History of Present Illness:   Patient has no prior cardiac history.  He denies dyspnea on exertion, orthopnea, PND, pedal edema, palpitation, syncope or chest pain.  He was admitted on May 31 with left upper extremity weakness.  He was found to have an acute CVA in the posterior right parietal lobe.  Echocardiogram showed apical wall motion abnormality with reduced LV function and cardiology asked to evaluate.   Past Medical History:  Diagnosis Date  . Arthritis   . Diabetes mellitus without complication ([place])   . Hyperlipidemia   . Hypertension     Past Surgical History:  Procedure Laterality Date  . TONSILLECTOMY       Inpatient Medications: Scheduled Meds: .  stroke: mapping our early stages of recovery book   Does not apply Once  . aspirin EC  81 mg Oral Daily  . clopidogrel  75 mg Oral Daily  . rosuvastatin  20 mg Oral Daily  . sodium chloride flush  3 mL Intravenous Once  . traZODone  100 mg Oral QHS   Continuous Infusions:  PRN Meds: acetaminophen **OR** acetaminophen (TYLENOL) oral liquid 160 mg/5 mL **OR** acetaminophen, senna-docusate  Allergies:   No Known Allergies  Social History:   Social History   Socioeconomic History  . Marital status: Married    Spouse name: Not on file  . Number of children: Not on file  . Years of education: Not on file  . Highest education level: Not on file  Occupational History  . Not on file  Tobacco Use  . Smoking status: Former Research scientist (life sciences)  . Smokeless  tobacco: Never Used  . Tobacco comment: Quit in 2017   Vaping Use  . Vaping Use: Never used  Substance and Sexual Activity  . Alcohol use: Yes    Comment: Socially  . Drug use: Never  . Sexual activity: Not on file  Other Topics Concern  . Not on file  Social History Narrative  . Not on file   Social Determinants of Health   Financial Resource Strain: Not on file  Food Insecurity: Not on file  Transportation Needs: Not on file  Physical Activity: Not on file  Stress: Not on file  Social Connections: Not on file  Intimate Partner Violence: Not on file    Family History:    Family History  Problem Relation Age of Onset  . Diabetes Mother   . CAD Mother   . Colon cancer Neg Hx   . Esophageal cancer Neg Hx   . Rectal cancer Neg Hx   . Stomach cancer Neg Hx      ROS:  Please see the history of present illness.  All other ROS reviewed and negative.     Physical Exam/Data:   Vitals:   06/01/20 2346 06/02/20 0359 06/02/20 0700 06/02/20 1205  BP: 128/77 117/69 125/86 125/81  Pulse: 75 95 83 77  Resp: 17 17 20 18   Temp: 98 F (36.7 C) 98.4 F (36.9 C) 98.2 F (36.8 C) 98 F (  36.7 C)  TempSrc:  Oral Oral Oral  SpO2: 98% 100% 100% 98%  Weight:        Intake/Output Summary (Last 24 hours) at 06/02/2020 1307 Last data filed at 06/02/2020 0813 Gross per 24 hour  Intake 237 ml  Output 25 ml  Net 212 ml   Last 3 Weights 05/31/2020 05/03/2020 03/11/2020  Weight (lbs) 174 lb 6.4 oz 172 lb 12.8 oz 176 lb 9.6 oz  Weight (kg) 79.107 kg 78.382 kg 80.105 kg     Body mass index is 25.75 kg/m.  General:  Well nourished, well developed, in no acute distress HEENT: normal Lymph: no adenopathy Neck: no JVD Endocrine:  No thryomegaly Vascular: No carotid bruits; FA pulses 2+ bilaterally without bruits  Cardiac:  normal S1, S2; RRR; no murmur  Lungs:  clear to auscultation bilaterally, no wheezing, rhonchi or rales  Abd: soft, nontender, no hepatomegaly  Ext: no  edema Musculoskeletal:  No deformities, BUE and BLE strength normal and equal Skin: warm and dry  Neuro:  CNs 2-12 intact, no focal abnormalities noted Psych:  Normal affect   EKG:  The EKG was personally reviewed and demonstrates: Normal sinus rhythm, septal infarct, left ventricular hypertrophy, anterior T wave inversion. Telemetry:  Telemetry was personally reviewed and demonstrates: Normal sinus rhythm   Laboratory Data:  Chemistry Recent Labs  Lab 05/31/20 1424 05/31/20 1430 06/01/20 0513 06/02/20 0212  NA 137 139 137 137  K 3.7 3.7 3.9 3.9  CL 102 103 107 104  CO2 26  --  24 24  GLUCOSE 101* 94 99 119*  BUN 17 18 16 16   CREATININE 0.78 0.70 0.76 0.71  CALCIUM 8.8*  --  8.7* 8.7*  GFRNONAA >60  --  >60 >60  ANIONGAP 9  --  6 9    Recent Labs  Lab 05/31/20 1424 06/01/20 0513  PROT 6.5 6.4*  ALBUMIN 3.5 3.3*  AST 19 19  ALT 19 18  ALKPHOS 69 65  BILITOT 0.7 0.9   Hematology Recent Labs  Lab 05/31/20 1424 05/31/20 1430 06/01/20 0513 06/02/20 0212  WBC 9.5  --  9.7 11.7*  RBC 4.89  --  4.97 4.90  HGB 14.8 14.6 14.6 14.5  HCT 43.4 43.0 44.4 43.5  MCV 88.8  --  89.3 88.8  MCH 30.3  --  29.4 29.6  MCHC 34.1  --  32.9 33.3  RDW 13.0  --  13.2 13.1  PLT 435*  --  432* 420*    Radiology/Studies:  MR BRAIN WO CONTRAST  Result Date: 06/01/2020 CLINICAL DATA:  Stroke.  Follow-up. EXAM: MRI HEAD WITHOUT CONTRAST TECHNIQUE: Multiplanar, multiecho pulse sequences of the brain and surrounding structures were obtained without intravenous contrast. COMPARISON:  CT head without contrast, MR head without contrast, and CTA head and neck 05/31/2020. FINDINGS: Brain: Previously noted area of diffusion abnormality involving the perirolandic cortex is mostly resolved. A more discrete area of cortical infarction is present one sulci of more posterior in the right parietal lobe. T2 and FLAIR signal changes are present in the cortex of the better defined and more posterior  infarct. There are no T2 or FLAIR changes in perirolandic cortex. Scattered periventricular and subcortical white matter changes are otherwise stable. The more confluent area adjacent to the atrium of the left lateral ventricle is stable. No acute hemorrhage or mass lesion is present. Ventricles are of normal size. No significant extra-axial fluid collection present. No additional acute infarcts are present. A remote lacunar infarct  of the left cerebellum is again noted. Vascular: Flow is present in the major intracranial arteries. Skull and upper cervical spine: Focal disc disease is present at C3-4. Craniocervical junction is normal. Mild degenerative changes are present C1-2. Marrow signal is otherwise normal. Sinuses/Orbits: Mild mucosal thickening is present in the maxillary sinuses and anterior ethmoid air cells bilaterally. The globes and orbits are within normal limits. IMPRESSION: 1. Previously noted area of diffusion abnormality involving the perirolandic deck cortex is mostly resolved. 2. More discrete area of new acute cortical infarction in the slightly more posterior right parietal lobe. 3. Remote lacunar infarct of the left cerebellum. 4. Stable atrophy and white matter disease otherwise likely reflects the sequela of chronic microvascular ischemia. 5. Mild sinus disease. Electronically Signed   By: San Morelle M.D.   On: 06/01/2020 15:43   MR BRAIN WO CONTRAST  Result Date: 05/31/2020 CLINICAL DATA:  Left-sided hand weakness EXAM: MRI HEAD WITHOUT CONTRAST TECHNIQUE: Multiplanar, multiecho pulse sequences of the brain and surrounding structures were obtained without intravenous contrast. COMPARISON:  None. FINDINGS: DWI, SWI, and T2 FLAIR sequences were obtained as part of a stroke protocol. There is a moderate size area of right frontoparietal diffusion hyperintensity with mild ADC isointensity. There is no substantial corresponding T2 FLAIR hyperintensity. This involves the  perirolandic region. A punctate focus of susceptibility is present in this region may reflect microhemorrhage or mineralization. Additional patchy foci of T2 hyperintensity in the supratentorial white matter nonspecific but may reflect mild to moderate chronic microvascular ischemic changes. This includes a more confluent area along the posterior left lateral ventricle. No mass effect. No hydrocephalus or extra-axial collection. In addition to above, there is also a punctate area susceptibility along the right lateral aspect of the midbrain also reflecting chronic microhemorrhage or mineralization. IMPRESSION: Moderate size area of abnormal right frontoparietal diffusion involving the perirolandic region without significant corresponding T2 FLAIR hyperintensity. This may reflect hyperacute infarction. A punctate focus of susceptibility is region could reflect age-indeterminate microhemorrhage. Chronic microvascular ischemic changes. A more confluent area of T2 hyperintensity along the posterior left lateral ventricle likely represents the same process, but a follow-up study in 1 year is suggested as an infiltrating neoplasm could have a similar appearance. Initial results were discussed by telephone at the time of interpretation on 05/31/2020 at 2:55 pm with provider Lesleigh Noe , who verbally acknowledged these results. Electronically Signed   By: Macy Mis M.D.   On: 05/31/2020 15:01   ECHOCARDIOGRAM COMPLETE  Result Date: 06/01/2020    ECHOCARDIOGRAM REPORT   Patient Name:   TILMAN MCCLAREN [name] Date of Exam: 06/01/2020 Medical Rec #:  [id number]          Height:       69.0 in Accession #:    [id number]         Weight:       174.4 lb Date of Birth:  Feb 09, 1960          BSA:          1.949 m Patient Age:    54 years           BP:           121/74 mmHg Patient Gender: M                  HR:           86 bpm. Exam Location:  Inpatient Procedure: 2D Echo, Cardiac Doppler and Color Doppler Indications:  Stroke  I63.9  History:        Patient has no prior history of Echocardiogram examinations.                 Risk Factors:Hypertension, Diabetes and Dyslipidemia.  Sonographer:    Tiffany Dance Referring Phys: [id number] [place]  1. Echogenic region in the LV lateral apex may represent mural thrombus vs prominent trabeculations in setting of apical regional wall motion abnormalities. Recommend limited echo with Definity contrast to exclude apical mural thrombus.  2. Left ventricular ejection fraction, by estimation, is 40 to 45%. The left ventricle has mildly decreased function. The left ventricle demonstrates regional wall motion abnormalities (see scoring diagram/findings for description). There is moderate eccentric left ventricular hypertrophy of the posterior-lateral segment. Left ventricular diastolic parameters are consistent with Grade I diastolic dysfunction (impaired relaxation).  3. Right ventricular systolic function is normal. The right ventricular size is normal. There is normal pulmonary artery systolic pressure. The estimated right ventricular systolic pressure is 99.2 mmHg.  4. The mitral valve is normal in structure. Trivial mitral valve regurgitation. No evidence of mitral stenosis.  5. The aortic valve is tricuspid. Aortic valve regurgitation is not visualized. No aortic stenosis is present.  6. The inferior vena cava is normal in size with greater than 50% respiratory variability, suggesting right atrial pressure of 3 mmHg. FINDINGS  Left Ventricle: Left ventricular ejection fraction, by estimation, is 40 to 45%. The left ventricle has mildly decreased function. The left ventricle demonstrates regional wall motion abnormalities. The left ventricular internal cavity size was normal in size. There is moderate eccentric left ventricular hypertrophy of the posterior-lateral segment. Left ventricular diastolic parameters are consistent with Grade I diastolic dysfunction (impaired  relaxation).  LV Wall Scoring: The mid and distal anterior septum, mid inferoseptal segment, and apical inferior segment are akinetic. The apical lateral segment and apical anterior segment are hypokinetic. Right Ventricle: The right ventricular size is normal. No increase in right ventricular wall thickness. Right ventricular systolic function is normal. There is normal pulmonary artery systolic pressure. The tricuspid regurgitant velocity is 2.02 m/s, and  with an assumed right atrial pressure of 3 mmHg, the estimated right ventricular systolic pressure is 42.6 mmHg. Left Atrium: Left atrial size was normal in size. Right Atrium: Right atrial size was normal in size. Pericardium: There is no evidence of pericardial effusion. Mitral Valve: The mitral valve is normal in structure. Trivial mitral valve regurgitation. No evidence of mitral valve stenosis. Tricuspid Valve: The tricuspid valve is normal in structure. Tricuspid valve regurgitation is trivial. No evidence of tricuspid stenosis. Aortic Valve: The aortic valve is tricuspid. Aortic valve regurgitation is not visualized. No aortic stenosis is present. Pulmonic Valve: The pulmonic valve was normal in structure. Pulmonic valve regurgitation is trivial. No evidence of pulmonic stenosis. Aorta: The aortic root is normal in size and structure. Venous: The inferior vena cava is normal in size with greater than 50% respiratory variability, suggesting right atrial pressure of 3 mmHg. IAS/Shunts: No atrial level shunt detected by color flow Doppler.  LEFT VENTRICLE PLAX 2D LVIDd:         4.90 cm  Diastology LVIDs:         3.50 cm  LV e' medial:    5.77 cm/s LV PW:         1.50 cm  LV E/e' medial:  11.7 LV IVS:        1.20 cm  LV e' lateral:   5.77  cm/s LVOT diam:     1.90 cm  LV E/e' lateral: 11.7 LV SV:         52 LV SV Index:   27 LVOT Area:     2.84 cm  RIGHT VENTRICLE             IVC RV Basal diam:  2.10 cm     IVC diam: 2.00 cm RV S prime:     14.60 cm/s TAPSE  (M-mode): 1.8 cm LEFT ATRIUM             Index       RIGHT ATRIUM           Index LA diam:        4.00 cm 2.05 cm/m  RA Area:     10.00 cm LA Vol (A2C):   57.9 ml 29.70 ml/m RA Volume:   18.70 ml  9.59 ml/m LA Vol (A4C):   58.8 ml 30.17 ml/m LA Biplane Vol: 58.5 ml 30.01 ml/m  AORTIC VALVE LVOT Vmax:   114.00 cm/s LVOT Vmean:  68.500 cm/s LVOT VTI:    0.184 m  AORTA Ao Root diam: 3.60 cm Ao Asc diam:  3.80 cm MITRAL VALVE               TRICUSPID VALVE MV Area (PHT): 2.83 cm    TR Peak grad:   16.3 mmHg MV Decel Time: 268 msec    TR Vmax:        202.00 cm/s MV E velocity: 67.30 cm/s MV A velocity: 97.30 cm/s  SHUNTS MV E/A ratio:  0.69        Systemic VTI:  0.18 m                            Systemic Diam: 1.90 cm Cherlynn Kaiser MD Electronically signed by Cherlynn Kaiser MD Signature Date/Time: 06/01/2020/9:55:27 PM    Final    CT HEAD CODE STROKE WO CONTRAST  Result Date: 05/31/2020 CLINICAL DATA:  Code stroke. Last known normal 1330, left-sided weakness. EXAM: CT HEAD WITHOUT CONTRAST TECHNIQUE: Contiguous axial images were obtained from the base of the skull through the vertex without intravenous contrast. COMPARISON:  No pertinent prior exams available for comparison. FINDINGS: Brain: Cerebral volume is normal for age. Mild patchy and ill-defined hypoattenuation within the cerebral white matter, nonspecific but compatible with chronic small vessel ischemic disease. Small chronic infarct within the superior left cerebellum (series 5, image 49) (series 2, image 12). There is no acute intracranial hemorrhage. No demarcated cortical infarct. No extra-axial fluid collection. No evidence of intracranial mass. No midline shift. Vascular: No hyperdense vessel.  Atherosclerotic calcifications Skull: Normal. Negative for fracture or focal lesion. Sinuses/Orbits: Visualized orbits show no acute finding. Mild bilateral ethmoid and maxillary sinus mucosal thickening. ASPECTS [facility] Stroke Program Early CT Score) -  Ganglionic level infarction (caudate, lentiform nuclei, internal capsule, insula, M1-M3 cortex): 7 - Supraganglionic infarction (M4-M6 cortex): 3 Total score (0-10 with 10 being normal): 10 These results were communicated to Dr. Curly Sh[place] at 2:25 pmon 5/31/2022by text page via the [facility] messaging system. IMPRESSION: No evidence of acute intracranial abnormality.  ASPECTS is 10. Mild cerebral white matter chronic small vessel ischemic disease. Small chronic infarct within the superior left cerebellar hemisphere. Mild paranasal sinus disease, as described. Electronically Signed   By: Kellie Simmering DO   On: 05/31/2020 14:26   ECHOCARDIOGRAM LIMITED  Result Date: 06/02/2020  ECHOCARDIOGRAM LIMITED REPORT   Patient Name:   DAMETRI OZBURN [name] Date of Exam: 06/02/2020 Medical Rec #:  [id number]          Height:       69.0 in Accession #:    [id number]         Weight:       174.4 lb Date of Birth:  Sep 04, 1960          BSA:          1.949 m Patient Age:    68 years           BP:           125/86 mmHg Patient Gender: M                  HR:           83 bpm. Exam Location:  Inpatient Procedure: 2D Echo Indications:    Stroke  History:        Patient has prior history of Echocardiogram examinations, most                 recent 06/01/2020. Risk Factors:Diabetes, Hypertension and                 Dyslipidemia.  Sonographer:    Cammy Brochure Referring Phys: [id number] [place]  1. Left ventricular ejection fraction, by estimation, is 30 to 35%. The left ventricle has moderately decreased function. The left ventricle demonstrates regional wall motion abnormalities (see scoring diagram/findings for description). There is akinesis of the left ventricular, mid inferoseptal wall and anteroseptal wall. There is akinesis of the left ventricular, apical septal wall, anterior wall and inferior wall. There is akinesis of the left ventricular, apical segment. FINDINGS  Left Ventricle: Left ventricular ejection fraction,  by estimation, is 30 to 35%. The left ventricle has moderately decreased function. The left ventricle demonstrates regional wall motion abnormalities. Fransico Him MD Electronically signed by Fransico Him MD Signature Date/Time: 06/02/2020/9:18:00 AM    Final    CT ANGIO HEAD CODE STROKE  Result Date: 05/31/2020 CLINICAL DATA:  Code stroke follow-up, abnormal MRI EXAM: CT ANGIOGRAPHY HEAD AND NECK TECHNIQUE: Multidetector CT imaging of the head and neck was performed using the standard protocol during bolus administration of intravenous contrast. Multiplanar CT image reconstructions and MIPs were obtained to evaluate the vascular anatomy. Carotid stenosis measurements (when applicable) are obtained utilizing NASCET criteria, using the distal internal carotid diameter as the denominator. CONTRAST:  59mL OMNIPAQUE IOHEXOL 350 MG/ML SOLN COMPARISON:  None. FINDINGS: CTA NECK FINDINGS Aortic arch: Mixed plaque along the aortic arch and patent great vessel origins. There is eccentric noncalcified plaque along the proximal left subclavian causing less than 50% stenosis. Right carotid system: Patent. Mixed plaque at the proximal ICA causes less than 50% stenosis. Left carotid system: Patent. Mixed but primarily calcified plaque at the proximal ICA causes less than 50% stenosis. Vertebral arteries: Patent and codominant.  No stenosis. Skeleton: Degenerative changes of the cervical spine. Other neck: Unremarkable. Upper chest: No apical lung mass. Review of the MIP images confirms the above findings CTA HEAD FINDINGS Anterior circulation: Intracranial internal carotid arteries are patent with mixed plaque causing up to moderate stenosis on the left at the level of the distal cavernous segment. Right M1 and proximal M2 MCA branches are patent. There is a probable attenuated right M4 branch in proximity to the area of infarction; difficult to separate from venous enhancement due  to contrast timing. Left middle and both  anterior cerebral arteries are patent. Anterior communicating artery is present. Posterior circulation: Intracranial vertebral arteries are patent. Left vertebral artery becomes diminutive after PICA origin. There is a small caliber proximal basilar artery. 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 Major cerebellar artery origins are patent. Posterior cerebral arteries are patent. A left posterior communicating artery is present with fetal origin of the left PCA. Probable small right posterior communicating artery. Venous sinuses: As permitted by contrast timing, patent. Review of the MIP images confirms the above findings IMPRESSION: No large vessel occlusion and neck. There is no proximal intracranial vessel occlusion. Probable attenuated right M4 MCA branch in proximity area of infarction. Plaque at the right greater than left proximal internal carotids causes less than 50% stenosis. 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 Electronically Signed   By: Macy Mis M.D.   On: 05/31/2020 15:28   CT ANGIO NECK CODE STROKE  Result Date: 05/31/2020 CLINICAL DATA:  Code stroke follow-up, abnormal MRI EXAM: CT ANGIOGRAPHY HEAD AND NECK TECHNIQUE: Multidetector CT imaging of the head and neck was performed using the standard protocol during bolus administration of intravenous contrast. Multiplanar CT image reconstructions and MIPs were obtained to evaluate the vascular anatomy. Carotid stenosis measurements (when applicable) are obtained utilizing NASCET criteria, using the distal internal carotid diameter as the denominator. CONTRAST:  35mL OMNIPAQUE IOHEXOL 350 MG/ML SOLN COMPARISON:  None. FINDINGS: CTA NECK FINDINGS Aortic arch: Mixed plaque along the aortic arch and patent great vessel origins. There is eccentric noncalcified plaque along the proximal left subclavian causing less than 50% stenosis. Right carotid system:  Patent. Mixed plaque at the proximal ICA causes less than 50% stenosis. Left carotid system: Patent. Mixed but primarily calcified plaque at the proximal ICA causes less than 50% stenosis. Vertebral arteries: Patent and codominant.  No stenosis. Skeleton: Degenerative changes of the cervical spine. Other neck: Unremarkable. Upper chest: No apical lung mass. Review of the MIP images confirms the above findings CTA HEAD FINDINGS Anterior circulation: Intracranial internal carotid arteries are patent with mixed plaque causing up to moderate stenosis on the left at the level of the distal cavernous segment. Right M1 and proximal M2 MCA branches are patent. There is a probable attenuated right M4 branch in proximity to the area of infarction; difficult to separate from venous enhancement due to contrast timing. Left middle and both anterior cerebral arteries are patent. Anterior communicating artery is present. Posterior circulation: Intracranial vertebral arteries are patent. Left vertebral artery becomes diminutive after PICA origin. There is a small caliber proximal basilar artery. 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 Major cerebellar artery origins are patent. Posterior cerebral arteries are patent. A left posterior communicating artery is present with fetal origin of the left PCA. Probable small right posterior communicating artery. Venous sinuses: As permitted by contrast timing, patent. Review of the MIP images confirms the above findings IMPRESSION: No large vessel occlusion and neck. There is no proximal intracranial vessel occlusion. Probable attenuated right M4 MCA branch in proximity area of infarction. Plaque at the right greater than left proximal internal carotids causes less than 50% stenosis. 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 Electronically Signed   By: Macy Mis M.D.   On: 05/31/2020 15:28      Assessment and Plan:   1. Abnormal  echocardiogram-I have personally reviewed the patient's transthoracic echocardiogram and follow-up limited study with Definity.  He has a distal septal and apical wall motion abnormality with moderate LV dysfunction.  I do not appreciate thrombus but there is swirling at the apex with increased risk for thrombus formation.  Given recent CVA we will plan medical therapy.  Treat with Crestor 40 mg daily, low-dose carvedilol at 3.125 mg twice daily and losartan 25 mg daily.  Titrate medications as an outpatient.  I will plan to proceed with cardiac catheterization in 8 to 12 weeks after he recovers from his CVA given multiple risk factors and wall motion abnormality noted on echocardiogram.  He has likely had a previous infarct but has not had symptoms. 2. Recent CVA-as outlined above there is swirling and evidence of low flow at the LV apex.  I am concerned that his CVA may have been related to apical thrombus (note no definitive thrombus on follow-up echocardiogram with [facility]).  If no contraindications from a neurological standpoint we will treat with apixaban 5 mg twice daily. 3. Ischemic cardiomyopathy-as outlined above we will treat with carvedilol and losartan and advance as an outpatient. 4. Probable coronary artery disease-increase Crestor from 20 to 40 mg daily.  We will not add aspirin given need for apixaban. 5. Hypertension-blood pressure controlled. 6. Hyperlipidemia-continue statin.  Patient can be discharged from cardiac standpoint.  I will arrange follow-up in 2 to 4 weeks with APP, likely proceed with cardiac catheterization in 8 to 12 weeks and follow-up with me in 3 months.  Note I discussed the risk and benefits of cardiac catheterization with patient including myocardial infarction, CVA and death and he agrees to proceed.   For questions or updates, please contact [place] Please consult [email or web address] for contact info under     Signed, Kirk Ruths, MD  06/02/2020 1:07 PM

## 2020-06-02 NOTE — TOC Benefit Eligibility Note (Signed)
Patient Advocate Encounter  Prior Authorization for Eliquis 5 mg has been approved.    PA# [id number] Effective dates: 06/02/2020 through 06/03/2021  Patients co-pay is $50.00.     Lyndel Safe, [place] Patient Advocate Specialist [place] [facility] Stewardship Team Direct Number: [phone]  Fax: [phone]

## 2020-06-02 NOTE — Plan of Care (Signed)
I discussed the limited Echo findings and cardiology consult recommendations and  new medications with [name] by phone. She manages patient's health issues, medications, appointments and does all the cooking. We discussed the discharge plan of care including follow up appointment plan. All her questions were answered.

## 2020-06-02 NOTE — Progress Notes (Signed)
OT Cancellation Note  Patient Details Name: John [name] MRN: [id number] DOB: [birth year]   Cancelled Treatment:    Reason Eval/Treat Not Completed: OT screened, no needs identified, will sign off  [name] 06/02/2020, 4:20 PM

## 2020-06-07 ENCOUNTER — Other Ambulatory Visit: Payer: Self-pay

## 2020-06-07 ENCOUNTER — Encounter: Payer: Self-pay | Admitting: Family Medicine

## 2020-06-07 ENCOUNTER — Ambulatory Visit: Payer: Managed Care, Other (non HMO) | Admitting: Family Medicine

## 2020-06-07 VITALS — BP 116/68 | HR 87 | Temp 97.0°F | Ht 69.0 in | Wt 165.0 lb

## 2020-06-07 DIAGNOSIS — Z8673 Personal history of transient ischemic attack (TIA), and cerebral infarction without residual deficits: Secondary | ICD-10-CM | POA: Diagnosis not present

## 2020-06-07 DIAGNOSIS — Z09 Encounter for follow-up examination after completed treatment for conditions other than malignant neoplasm: Secondary | ICD-10-CM | POA: Diagnosis not present

## 2020-06-07 NOTE — Progress Notes (Signed)
Established Patient Office Visit  Subjective:  Patient ID: John [name], male    DOB: 1960/01/16  Age: 60 y.o. MRN: [id number]  CC:  Chief Complaint  Patient presents with  . Hospitalization Follow-up    Hospital follow up seen for stroke, patient states that he seems to have a lot of leg cramps at night. Not sure if he can have a little salt in food.     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  Patient woke up in the morning and developed acute left arm weakness.  His wife called 3 and he presented promptly to the hospital where he was treated with tPA for above-mentioned stroke.  He has recovered full use of the left arm.  There is plaque in his carotid arteries with less than 50% stenosis.  Echocardiogram obtained during his hospitalization showed a 35% ejection fraction with some hypokinesis of the left ventricle.  He is scheduled for cardiology follow-up.  He will see neurology on the 28th of this month.  He was started on Eliquis.  He was changed from losartan to Hyzaar 100/25 and his Crestor was increased to 40 mg from 20 mg.  Past Medical History:  Diagnosis Date  . Arthritis   . Diabetes mellitus without complication ([place])   . Hyperlipidemia   . Hypertension     Past Surgical History:  Procedure Laterality Date  . TONSILLECTOMY      Family History  Problem Relation Age of Onset  . Diabetes Mother   . CAD Mother   . Colon cancer Neg Hx   . Esophageal cancer Neg Hx   . Rectal cancer Neg Hx   . Stomach cancer Neg Hx     Social History   Socioeconomic History  . Marital status: Married    Spouse name: Not on file  . Number of children: Not on file  . Years of education: Not on file  . Highest education level: Not on file  Occupational History  . Not on file  Tobacco Use  . Smoking status: Former Research scientist (life sciences)  . Smokeless tobacco: Never Used  . Tobacco comment: Quit in 2017   Vaping Use   . Vaping Use: Never used  Substance and Sexual Activity  . Alcohol use: Yes    Comment: Socially  . Drug use: Never  . Sexual activity: Not on file  Other Topics Concern  . Not on file  Social History Narrative  . Not on file   Social Determinants of Health   Financial Resource Strain: Not on file  Food Insecurity: Not on file  Transportation Needs: Not on file  Physical Activity: Not on file  Stress: Not on file  Social Connections: Not on file  Intimate Partner Violence: Not on file    Outpatient Medications Prior to Visit  Medication Sig Dispense Refill  . apixaban (ELIQUIS) 5 MG TABS tablet Take 1 tablet (5 mg total) by mouth 2 (two) times daily. 60 tablet 2  . carbamide peroxide (DEBROX) 6.5 % OTIC solution Place 5 drops into both ears 2 (two) times daily. 15 mL 3  . carvedilol (COREG) 3.125 MG tablet Take 1 tablet (3.125 mg total) by mouth 2 (two) times daily with a meal. 60 tablet 1  . Continuous Blood Gluc Sensor (FREESTYLE LIBRE 14 DAY SENSOR) MISC 1 Device by Does not apply route every 14 (fourteen) days. 6 each 3  . Dulaglutide (TRULICITY)  1.5 MG/0.5ML SOPN Inject 1.5 mg into the skin once a week. (Patient taking differently: Inject 1.5 mg into the skin once a week. Sundays) 6 mL 3  . FARXIGA 10 MG TABS tablet TAKE ONE TABLET BY MOUTH DAILY 30 tablet 3  . glucose blood test strip 1 each by Other route 2 (two) times daily. And lancets 2/day 200 each 3  . insulin degludec (TRESIBA FLEXTOUCH) 100 UNIT/ML FlexTouch Pen Inject 40 Units into the skin daily. 15 mL 3  . insulin lispro (HUMALOG KWIKPEN) 100 UNIT/ML KwikPen Inject 0.05-0.1 mLs (5-10 Units total) into the skin 3 (three) times daily with meals. And pen needles 4/day 15 mL 11  . Insulin Pen Needle (PEN NEEDLES) 32G X 4 MM MISC 1 Package by Does not apply route 4 (four) times daily. 360 each 2  . metFORMIN (GLUCOPHAGE-XR) 500 MG 24 hr tablet Take 1 tablet (500 mg total) by mouth 2 (two) times daily. 180 tablet 3  .  rosuvastatin (CRESTOR) 40 MG tablet Take 1 tablet (40 mg total) by mouth daily. 30 tablet 1  . sildenafil (VIAGRA) 100 MG tablet TAKE ONE TABLET BY MOUTH DAILY AS NEEDED FOR ERECTILE DYSFUNCTION (Patient taking differently: Take 100 mg by mouth as needed for erectile dysfunction.) 30 tablet 2  . losartan (COZAAR) 25 MG tablet Take 1 tablet (25 mg total) by mouth daily. (Patient not taking: Reported on 06/07/2020) 30 tablet 1  . senna-docusate (SENOKOT-S) 8.6-50 MG tablet Take 1 tablet by mouth at bedtime as needed for mild constipation. (Patient not taking: Reported on 06/07/2020)     No facility-administered medications prior to visit.    No Known Allergies  ROS Review of Systems  Constitutional: Negative.   HENT: Negative.   Eyes: Negative for photophobia and visual disturbance.  Respiratory: Negative for chest tightness and shortness of breath.   Cardiovascular: Negative for chest pain, palpitations and leg swelling.  Gastrointestinal: Negative.   Musculoskeletal: Negative for gait problem.  Neurological: Negative for weakness and numbness.  Psychiatric/Behavioral: Negative.       Objective:    Physical Exam Vitals and nursing note reviewed.  Constitutional:      General: He is not in acute distress.    Appearance: Normal appearance. He is not ill-appearing, toxic-appearing or diaphoretic.  HENT:     Head: Normocephalic and atraumatic.     Right Ear: Tympanic membrane, ear canal and external ear normal.     Left Ear: Tympanic membrane, ear canal and external ear normal.     Mouth/Throat:     Mouth: Mucous membranes are moist.     Pharynx: Oropharynx is clear. No oropharyngeal exudate or posterior oropharyngeal erythema.  Eyes:     General: No scleral icterus.       Right eye: No discharge.        Left eye: No discharge.     Extraocular Movements: Extraocular movements intact.     Conjunctiva/sclera: Conjunctivae normal.     Pupils: Pupils are equal, round, and reactive to  light.  Neck:     Vascular: No carotid bruit.  Cardiovascular:     Rate and Rhythm: Normal rate and regular rhythm.     Pulses:          Carotid pulses are 1+ on the right side and 1+ on the left side. Pulmonary:     Effort: Pulmonary effort is normal.     Breath sounds: Normal breath sounds.  Abdominal:     General: Bowel sounds  are normal.  Musculoskeletal:     Cervical back: No rigidity or tenderness.     Right lower leg: No edema.     Left lower leg: No edema.  Lymphadenopathy:     Cervical: No cervical adenopathy.  Skin:    General: Skin is warm and dry.  Neurological:     Mental Status: He is alert and oriented to person, place, and time.  Psychiatric:        Mood and Affect: Mood normal.        Behavior: Behavior normal.     BP 116/68   Pulse 87   Temp (!) 97 F (36.1 C) (Temporal)   Ht 5\' 9"  (1.753 m)   Wt 165 lb (74.8 kg)   SpO2 96%   BMI 24.37 kg/m  Wt Readings from Last 3 Encounters:  06/07/20 165 lb (74.8 kg)  05/31/20 174 lb 6.4 oz (79.1 kg)  05/03/20 172 lb 12.8 oz (78.4 kg)     Health Maintenance Due  Topic Date Due  . PNEUMOCOCCAL POLYSACCHARIDE VACCINE AGE 27-64 HIGH RISK  Never done  . Pneumococcal Vaccine 68-42 Years old (1 of 2 - PPSV23) Never done  . OPHTHALMOLOGY EXAM  Never done  . Hepatitis C Screening  Never done  . TETANUS/TDAP  Never done  . Zoster Vaccines- Shingrix (1 of 2) Never done    There are no preventive care reminders to display for this patient.  Lab Results  Component Value Date   TSH 1.54 10/08/2017   Lab Results  Component Value Date   WBC 11.7 (H) 06/02/2020   HGB 14.5 06/02/2020   HCT 43.5 06/02/2020   MCV 88.8 06/02/2020   PLT 420 (H) 06/02/2020   Lab Results  Component Value Date   NA 137 06/02/2020   K 3.9 06/02/2020   CO2 24 06/02/2020   GLUCOSE 119 (H) 06/02/2020   BUN 16 06/02/2020   CREATININE 0.71 06/02/2020   BILITOT 0.9 06/01/2020   ALKPHOS 65 06/01/2020   AST 19 06/01/2020   ALT 18  06/01/2020   PROT 6.4 (L) 06/01/2020   ALBUMIN 3.3 (L) 06/01/2020   CALCIUM 8.7 (L) 06/02/2020   ANIONGAP 9 06/02/2020   GFR 96.53 05/03/2020   Lab Results  Component Value Date   CHOL 118 06/01/2020   Lab Results  Component Value Date   HDL 36 (L) 06/01/2020   Lab Results  Component Value Date   LDLCALC 64 06/01/2020   Lab Results  Component Value Date   TRIG 91 06/01/2020   Lab Results  Component Value Date   CHOLHDL 3.3 06/01/2020   Lab Results  Component Value Date   HGBA1C 6.9 (H) 06/01/2020      Assessment & Plan:   Problem List Items Addressed This Visit      Other   Hospital discharge follow-up - Primary      No orders of the defined types were placed in this encounter.   Follow-up: Return in about 3 months (around 09/07/2020).   Continue all medicines as above.  Follow-up with neurologist in 4 weeks and for scheduled appointment with cardiology.  Continue all medicines as above and follow-up with me in 3 months or sooner if needed.  Remember that we have scheduled you for ankle brachial indexes.  Please also have these done.  Keep scheduled appointments with neurology and cardiology. Libby Maw, MD

## 2020-06-13 ENCOUNTER — Telehealth: Payer: Self-pay | Admitting: Pharmacy Technician

## 2020-06-13 NOTE — Telephone Encounter (Addendum)
Patient Advocate Encounter   Received notification from [facility] that prior authorization for TRULICITY is required.   PA submitted on 06/13/2020 Key [id number] Status is APPROVED [facility] 06/13/2021  [id number]    [facility] will continue to follow.   Venida Jarvis. Nadara Mustard, CPhT Patient Advocate [facility] Phone: [phone] Fax:  [phone]

## 2020-06-17 ENCOUNTER — Other Ambulatory Visit: Payer: Self-pay | Admitting: *Deleted

## 2020-06-17 NOTE — Patient Outreach (Signed)
[facility]) Care Management  06/17/2020  Cory Munch II Aug 23, 1960 [id number]   RED ON EMMI ALERT - Stroke  Day # 13 Date: 6/16 Red Alert Reason: Not had follow up appointment   Outreach attempt #1, successful.  Identity verified.  This care manager introduced self and stated purpose of call.  [facility] care management services explained.    Member report he has been doing well since discharge, no complications from admission.  Independent, lives with wife.  He had appointment with PCP on 6/7, scheduled for cardiology on 6/28 and neurology on 7/13.  Does not have any questions about discharge plan or medications. Advised to contact this care manager with concerns.  Plan: RN CM will send member this care manager's contact information.  Will also notify chronic care manager at PCP's office of admission for follow up going forward.  Valente David, [place], MSN [place] [phone]

## 2020-06-28 ENCOUNTER — Ambulatory Visit (INDEPENDENT_AMBULATORY_CARE_PROVIDER_SITE_OTHER): Payer: Managed Care, Other (non HMO) | Admitting: Physician Assistant

## 2020-06-28 ENCOUNTER — Encounter: Payer: Self-pay | Admitting: Physician Assistant

## 2020-06-28 ENCOUNTER — Other Ambulatory Visit: Payer: Self-pay

## 2020-06-28 VITALS — BP 104/69 | HR 98 | Ht 69.0 in | Wt 164.0 lb

## 2020-06-28 DIAGNOSIS — E785 Hyperlipidemia, unspecified: Secondary | ICD-10-CM

## 2020-06-28 DIAGNOSIS — I519 Heart disease, unspecified: Secondary | ICD-10-CM

## 2020-06-28 DIAGNOSIS — Z8673 Personal history of transient ischemic attack (TIA), and cerebral infarction without residual deficits: Secondary | ICD-10-CM

## 2020-06-28 DIAGNOSIS — I10 Essential (primary) hypertension: Secondary | ICD-10-CM

## 2020-06-28 DIAGNOSIS — E119 Type 2 diabetes mellitus without complications: Secondary | ICD-10-CM | POA: Diagnosis not present

## 2020-06-28 NOTE — Patient Instructions (Signed)
Medication Instructions:  Your provider recommends that you continue on your current medications as directed. Please refer to the Current Medication list given to you today.  *If you need a refill on your cardiac medications before your next appointment, please call your pharmacy*   Lab Work: None ordered.    Testing/Procedures: None ordered.    Follow-Up: At [facility], you and your health needs are our priority.  As part of our continuing mission to provide you with exceptional heart care, we have created designated Provider Care Teams.  These Care Teams include your primary Cardiologist (physician) and Advanced Practice Providers (APPs -  Physician Assistants and Nurse Practitioners) who all work together to provide you with the care you need, when you need it.  We recommend signing up for the patient portal called "MyChart".  Sign up information is provided on this After Visit Summary.  MyChart is used to connect with patients for Virtual Visits (Telemedicine).  Patients are able to view lab/test results, encounter notes, upcoming appointments, etc.  Non-urgent messages can be sent to your provider as well.   To learn more about what you can do with MyChart, go to [email or web address].    Your next appointment:   August 11 at 3:15pm   The format for your next appointment:   In Person  Provider:   Almyra Deforest, PA

## 2020-06-28 NOTE — Progress Notes (Signed)
Cardiology Office Note:    Date:  06/30/2020   ID:  John [name], DOB 03-16-1960, MRN [id number]  PCP:  Libby Maw, MD   [facility] Heart[facility] Providers Cardiologist:  Kirk Ruths, MD     Referring MD: Libby Maw,*   Chief Complaint  Patient presents with   Follow-up    Seen for [name]    History of Present Illness:    John [name] is a 60 y.o. male with a hx of hypertension, hyperlipidemia, DM2 and recent CVA.  Patient was admitted on 05/31/2020 with left upper extremity weakness and found to have acute CVA in the posterior right parietal lobe.  Echocardiogram showed atypical wall motion abnormality with reduced LV function.  There was no obvious thrombus however there is swirling in the apex with increased risk for thrombus formation.  Due to the recent CVA, it was recommended to continue medical management with low-dose carvedilol and losartan along with Crestor.  Titrate medication as outpatient.  We will consider cardiac catheterization in 8 to 12 weeks after he has recovered from CVA given multiple risk factors and the wall motion abnormality seen on the echocardiogram.  Patient was felt likely had a previous infarct however did not have any anginal symptoms.  Given the swirling in the apex, there is some concern that his CVA may be cardioembolic, therefore it was recommended for the patient to start on 5 mg twice a day of Eliquis.  Patient presents today for follow-up.  He does have some imbalance issue however no ipsilateral weakness at this point.  Blood pressure is borderline low however he did not have any significant dizziness or feeling of passing out.  We discussed the recent hospital work-up.  He denies any recent chest pain or worsening dyspnea.  He does not remember ever having anginal symptoms.  I recommended follow-up in 4 to 6 weeks with EKG in preparation to proceed with outpatient cardiac catheterization.  If he is still stable on the  next visit, we will arrange left heart cath.  Past Medical History:  Diagnosis Date   Arthritis    Diabetes mellitus without complication ([place])    Hyperlipidemia    Hypertension     Past Surgical History:  Procedure Laterality Date   TONSILLECTOMY      Current Medications: Current Meds  Medication Sig   apixaban (ELIQUIS) 5 MG TABS tablet Take 1 tablet (5 mg total) by mouth 2 (two) times daily.   carbamide peroxide (DEBROX) 6.5 % OTIC solution Place 5 drops into both ears 2 (two) times daily.   carvedilol (COREG) 3.125 MG tablet Take 1 tablet (3.125 mg total) by mouth 2 (two) times daily with a meal.   Continuous Blood Gluc Sensor (FREESTYLE LIBRE 14 DAY SENSOR) MISC 1 Device by Does not apply route every 14 (fourteen) days.   Dulaglutide (TRULICITY) 1.5 KD/9.8PJ SOPN Inject 1.5 mg into the skin once a week. (Patient taking differently: Inject 1.5 mg into the skin once a week. Sundays)   FARXIGA 10 MG TABS tablet TAKE ONE TABLET BY MOUTH DAILY   glucose blood test strip 1 each by Other route 2 (two) times daily. And lancets 2/day   insulin degludec (TRESIBA FLEXTOUCH) 100 UNIT/ML FlexTouch Pen Inject 40 Units into the skin daily.   insulin lispro (HUMALOG KWIKPEN) 100 UNIT/ML KwikPen Inject 0.05-0.1 mLs (5-10 Units total) into the skin 3 (three) times daily with meals. And pen needles 4/day   Insulin Pen Needle (  PEN NEEDLES) 32G X 4 MM MISC 1 Package by Does not apply route 4 (four) times daily.   metFORMIN (GLUCOPHAGE-XR) 500 MG 24 hr tablet Take 1 tablet (500 mg total) by mouth 2 (two) times daily.   rosuvastatin (CRESTOR) 40 MG tablet Take 1 tablet (40 mg total) by mouth daily.   sildenafil (VIAGRA) 100 MG tablet TAKE ONE TABLET BY MOUTH DAILY AS NEEDED FOR ERECTILE DYSFUNCTION (Patient taking differently: Take 100 mg by mouth as needed for erectile dysfunction.)     Allergies:   Patient has no known allergies.   Social History   Socioeconomic History   Marital status:  Married    Spouse name: Not on file   Number of children: Not on file   Years of education: Not on file   Highest education level: Not on file  Occupational History   Not on file  Tobacco Use   Smoking status: Former    Pack years: 0.00   Smokeless tobacco: Never   Tobacco comments:    Quit in 2017   Vaping Use   Vaping Use: Never used  Substance and Sexual Activity   Alcohol use: Yes    Comment: Socially   Drug use: Never   Sexual activity: Not on file  Other Topics Concern   Not on file  Social History Narrative   Not on file   Social Determinants of Health   Financial Resource Strain: Not on file  Food Insecurity: Not on file  Transportation Needs: Not on file  Physical Activity: Not on file  Stress: Not on file  Social Connections: Not on file     Family History: The patient's family history includes CAD in his mother; Diabetes in his mother. There is no history of Colon cancer, Esophageal cancer, Rectal cancer, or Stomach cancer.  ROS:   Please see the history of present illness.     All other systems reviewed and are negative.  EKGs/Labs/Other Studies Reviewed:    The following studies were reviewed today:  Echo 06/02/2020 1. Left ventricular ejection fraction, by estimation, is 30 to 35%. The  left ventricle has moderately decreased function. The left ventricle  demonstrates regional wall motion abnormalities (see scoring  diagram/findings for description). There is  akinesis of the left ventricular, mid inferoseptal wall and anteroseptal  wall. There is akinesis of the left ventricular, apical septal wall,  anterior wall and inferior wall. There is akinesis of the left  ventricular, apical segment.   EKG:  EKG is ordered today.  The ekg ordered today demonstrates normal sinus rhythm, no significant ST-T wave changes.  Recent Labs: 06/01/2020: ALT 18 06/02/2020: BUN 16; Creatinine, Ser 0.71; Hemoglobin 14.5; Platelets 420; Potassium 3.9; Sodium 137   Recent Lipid Panel    Component Value Date/Time   CHOL 118 06/01/2020 0513   TRIG 91 06/01/2020 0513   HDL 36 (L) 06/01/2020 0513   CHOLHDL 3.3 06/01/2020 0513   VLDL 18 06/01/2020 0513   LDLCALC 64 06/01/2020 0513     Risk Assessment/Calculations:           Physical Exam:    VS:  BP 104/69   Pulse 98   Ht 5\' 9"  (1.753 m)   Wt 164 lb (74.4 kg)   SpO2 98%   BMI 24.22 kg/m     Wt Readings from Last 3 Encounters:  06/28/20 164 lb (74.4 kg)  06/07/20 165 lb (74.8 kg)  05/31/20 174 lb 6.4 oz (79.1 kg)  GEN:  Well nourished, well developed in no acute distress HEENT: Normal NECK: No JVD; No carotid bruits LYMPHATICS: No lymphadenopathy CARDIAC: RRR, no murmurs, rubs, gallops RESPIRATORY:  Clear to auscultation without rales, wheezing or rhonchi  ABDOMEN: Soft, non-tender, non-distended MUSCULOSKELETAL:  No edema; No deformity  SKIN: Warm and dry NEUROLOGIC:  Alert and oriented x 3 PSYCHIATRIC:  Normal affect   ASSESSMENT:    1. LV dysfunction   2. Essential hypertension   3. Hyperlipidemia LDL goal <70   4. Controlled type 2 diabetes mellitus without complication, without long-term current use of insulin ([place])   5. H/O: CVA (cerebrovascular accident)    PLAN:    In order of problems listed above:  LV dysfunction: Diagnosed in the setting of acute CVA.  Seen by [name] in the hospital who recommended outpatient cardiac catheterization in 8 to 12 weeks after he recovered from the stroke.  I plan to bring the patient back in a few weeks, if he is still doing well at that time we will arrange outpatient cardiac catheterization.  He denies any recent chest pain  Hypertension: Blood pressure stable on current therapy  Hyperlipidemia: On Crestor  DM2: Managed by primary care provider  Recent CVA: No sign of significant neurological deficit         Medication Adjustments/Labs and Tests Ordered: Current medicines are reviewed at length with the  patient today.  Concerns regarding medicines are outlined above.  Orders Placed This Encounter  Procedures   EKG 12-Lead   No orders of the defined types were placed in this encounter.   Patient Instructions  Medication Instructions:  Your provider recommends that you continue on your current medications as directed. Please refer to the Current Medication list given to you today.  *If you need a refill on your cardiac medications before your next appointment, please call your pharmacy*   Lab Work: None ordered.    Testing/Procedures: None ordered.    Follow-Up: At [facility], you and your health needs are our priority.  As part of our continuing mission to provide you with exceptional heart care, we have created designated Provider Care Teams.  These Care Teams include your primary Cardiologist (physician) and Advanced Practice Providers (APPs -  Physician Assistants and Nurse Practitioners) who all work together to provide you with the care you need, when you need it.  We recommend signing up for the patient portal called "MyChart".  Sign up information is provided on this After Visit Summary.  MyChart is used to connect with patients for Virtual Visits (Telemedicine).  Patients are able to view lab/test results, encounter notes, upcoming appointments, etc.  Non-urgent messages can be sent to your provider as well.   To learn more about what you can do with MyChart, go to [email or web address].    Your next appointment:   August 11 at 3:15pm   The format for your next appointment:   In Person  Provider:   Almyra Deforest, PA      Signed, Almyra Deforest, [place]  06/30/2020 9:21 PM    [place]

## 2020-06-30 ENCOUNTER — Encounter: Payer: Self-pay | Admitting: Physician Assistant

## 2020-07-01 ENCOUNTER — Other Ambulatory Visit: Payer: Self-pay

## 2020-07-01 ENCOUNTER — Ambulatory Visit: Payer: Managed Care, Other (non HMO) | Admitting: Endocrinology

## 2020-07-01 VITALS — BP 110/80 | HR 78 | Ht 69.0 in | Wt 162.0 lb

## 2020-07-01 DIAGNOSIS — E1139 Type 2 diabetes mellitus with other diabetic ophthalmic complication: Secondary | ICD-10-CM

## 2020-07-01 DIAGNOSIS — Z794 Long term (current) use of insulin: Secondary | ICD-10-CM | POA: Diagnosis not present

## 2020-07-01 LAB — POCT GLYCOSYLATED HEMOGLOBIN (HGB A1C): Hemoglobin A1C: 6.3 % — AB (ref 4.0–5.6)

## 2020-07-01 NOTE — Patient Instructions (Addendum)
Please reduce the Tresiba to 40 units per day, and:  Please continue the same other medications.   check your blood sugar twice a day.  vary the time of day when you check, between before the 3 meals, and at bedtime.  also check if you have symptoms of your blood sugar being too high or too low.  please keep a record of the readings and bring it to your next appointment here (or you can bring the meter itself).  You can write it on any piece of paper.  please call us sooner if your blood sugar goes below 70, or if you have a lot of readings over 200.  Please come back for a follow-up appointment in 2-3 months.

## 2020-07-01 NOTE — Progress Notes (Signed)
Subjective:    Patient ID: John [name], male    DOB: [birth year], 60 y.o.   MRN: [id number]  HPI Pt returns for f/u of diabetes mellitus: DM type: Insulin-requiring type 2 Dx'ed: 1884 Complications: DR, CAD, and CVA Therapy: insulin since 1660, Trulicity, and 2 oral meds.   DKA: never Severe hypoglycemia: never Pancreatitis: never Pancreatic imaging: never.   SDOH: he declines more frequent f/u here, due to high copay; he is not using continuous glucose monitor, due to cost.   Other: He works 3rd shift, in grocery distribution; he takes multiple daily injections, but basal insulin is emphasized.   Interval history: no cbg record, but states cbg's vary from 70-200.  He says tresiba is 50/d.  He takes other meds as rx'ed.  He is recovering from CVA.   Past Medical History:  Diagnosis Date   Arthritis    Diabetes mellitus without complication ([place])    Hyperlipidemia    Hypertension     Past Surgical History:  Procedure Laterality Date   TONSILLECTOMY      Social History   Socioeconomic History   Marital status: Married    Spouse name: Not on file   Number of children: Not on file   Years of education: Not on file   Highest education level: Not on file  Occupational History   Not on file  Tobacco Use   Smoking status: Former    Pack years: 0.00   Smokeless tobacco: Never   Tobacco comments:    Quit in 2017   Vaping Use   Vaping Use: Never used  Substance and Sexual Activity   Alcohol use: Yes    Comment: Socially   Drug use: Never   Sexual activity: Not on file  Other Topics Concern   Not on file  Social History Narrative   Not on file   Social Determinants of Health   Financial Resource Strain: Not on file  Food Insecurity: Not on file  Transportation Needs: Not on file  Physical Activity: Not on file  Stress: Not on file  Social Connections: Not on file  Intimate Partner Violence: Not on file    Current Outpatient Medications on File Prior to  Visit  Medication Sig Dispense Refill   apixaban (ELIQUIS) 5 MG TABS tablet Take 1 tablet (5 mg total) by mouth 2 (two) times daily. 60 tablet 2   carbamide peroxide (DEBROX) 6.5 % OTIC solution Place 5 drops into both ears 2 (two) times daily. 15 mL 3   carvedilol (COREG) 3.125 MG tablet Take 1 tablet (3.125 mg total) by mouth 2 (two) times daily with a meal. 60 tablet 1   Continuous Blood Gluc Sensor (FREESTYLE LIBRE 14 DAY SENSOR) MISC 1 Device by Does not apply route every 14 (fourteen) days. 6 each 3   Dulaglutide (TRULICITY) 1.5 YT/0.1SW SOPN Inject 1.5 mg into the skin once a week. (Patient taking differently: Inject 1.5 mg into the skin once a week. Sundays) 6 mL 3   FARXIGA 10 MG TABS tablet TAKE ONE TABLET BY MOUTH DAILY 30 tablet 3   glucose blood test strip 1 each by Other route 2 (two) times daily. And lancets 2/day 200 each 3   insulin degludec (TRESIBA FLEXTOUCH) 100 UNIT/ML FlexTouch Pen Inject 40 Units into the skin daily. 15 mL 3   insulin lispro (HUMALOG KWIKPEN) 100 UNIT/ML KwikPen Inject 0.05-0.1 mLs (5-10 Units total) into the skin 3 (three) times daily with meals. And pen needles  4/day 15 mL 11   Insulin Pen Needle (PEN NEEDLES) 32G X 4 MM MISC 1 Package by Does not apply route 4 (four) times daily. 360 each 2   metFORMIN (GLUCOPHAGE-XR) 500 MG 24 hr tablet Take 1 tablet (500 mg total) by mouth 2 (two) times daily. 180 tablet 3   rosuvastatin (CRESTOR) 40 MG tablet Take 1 tablet (40 mg total) by mouth daily. 30 tablet 1   sildenafil (VIAGRA) 100 MG tablet TAKE ONE TABLET BY MOUTH DAILY AS NEEDED FOR ERECTILE DYSFUNCTION (Patient taking differently: Take 100 mg by mouth as needed for erectile dysfunction.) 30 tablet 2   No current facility-administered medications on file prior to visit.    No Known Allergies  Family History  Problem Relation Age of Onset   Diabetes Mother    CAD Mother    Colon cancer Neg Hx    Esophageal cancer Neg Hx    Rectal cancer Neg Hx     Stomach cancer Neg Hx     BP 110/80 (BP Location: Right Arm, Patient Position: Sitting, Cuff Size: Normal)   Pulse 78   Ht 5\' 9"  (1.753 m)   Wt 162 lb (73.5 kg)   SpO2 98%   BMI 23.92 kg/m    Review of Systems Denies n/v    Objective:   Physical Exam Pulses: dorsalis pedis intact bilat.   MSK: no deformity of the feet CV: no leg edema Skin:  no ulcer on the feet, but the skin is dry and scaly.  There are heavy calluses.  normal color and temp on the feet. Neuro: sensation is intact to touch on the feet Ext: there is bilateral onychomycosis of the toenails.    Lab Results  Component Value Date   HGBA1C 6.3 (A) 07/01/2020        Assessment & Plan:  Insulin-requiring type 2 DM: overcontrolled  Patient Instructions  Please reduce the Tresiba to 40 units per day, and:  Please continue the same other medications.   check your blood sugar twice a day.  vary the time of day when you check, between before the 3 meals, and at bedtime.  also check if you have symptoms of your blood sugar being too high or too low.  please keep a record of the readings and bring it to your next appointment here (or you can bring the meter itself).  You can write it on any piece of paper.  please call us sooner if your blood sugar goes below 70, or if you have a lot of readings over 200.  Please come back for a follow-up appointment in 2-3 months.

## 2020-07-13 ENCOUNTER — Ambulatory Visit (INDEPENDENT_AMBULATORY_CARE_PROVIDER_SITE_OTHER): Payer: Managed Care, Other (non HMO) | Admitting: Adult Health

## 2020-07-13 ENCOUNTER — Encounter: Payer: Self-pay | Admitting: Adult Health

## 2020-07-13 VITALS — BP 106/77 | HR 102 | Ht 66.0 in | Wt 164.0 lb

## 2020-07-13 DIAGNOSIS — I639 Cerebral infarction, unspecified: Secondary | ICD-10-CM

## 2020-07-13 DIAGNOSIS — E1139 Type 2 diabetes mellitus with other diabetic ophthalmic complication: Secondary | ICD-10-CM

## 2020-07-13 DIAGNOSIS — I10 Essential (primary) hypertension: Secondary | ICD-10-CM | POA: Diagnosis not present

## 2020-07-13 DIAGNOSIS — E7800 Pure hypercholesterolemia, unspecified: Secondary | ICD-10-CM | POA: Diagnosis not present

## 2020-07-13 DIAGNOSIS — Z794 Long term (current) use of insulin: Secondary | ICD-10-CM

## 2020-07-13 NOTE — Progress Notes (Signed)
[facility] [address] [place] [address] [phone]       HOSPITAL FOLLOW UP NOTE  John [name] Date of Birth:  07-Jan-1960 Medical Record Number:  [id number]   Reason for Referral:  hospital stroke follow up    SUBJECTIVE:   CHIEF COMPLAINT:  Chief Complaint  Patient presents with   Cerebrovascular Accident    Rm 1 Hospital FU    HPI:   60 year old male with history of hypertension, hyperlipidemia, diabetes who presented on 05/31/2020 to ER for left hand and arm weakness numbness.  Personally reviewed hospitalization pertinent progress notes, lab work and imaging with summary provided.  Evaluated by Dr. 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  CTA head/neck showed probable attenuation right M4 MCA branch as well as plaque in the right greater than left proximal internal carotids less than 50% stenosis.  Imaging also showed remote lacunar infarct in the left cerebellum.  2D echo EF 30% with swelling apex with increased risk for thrombus formation -cardiology recommended Eliquis 5 mg twice daily for secondary stroke prevention in setting of new diagnosis of cardiomyopathy and plan on cardiac cath outpatient.  LDL 64.  A1c 7.9.  Remained on Crestor 20 mg daily.  Other stroke risk factors include former tobacco use, EtOH use, recent travel, prior history of stroke on imaging, CAD and new diagnosis of cardiomyopathy.  PT/OT no recommendations.  Today, 07/13/2020, John [name] is being seen for hospital follow-up unaccompanied.  He has been doing well since discharge without new or reoccurring stroke/TIA symptoms.  Reports compliance on Eliquis and Crestor tolerating without associated side effects.  Blood pressure today 106/77. Does not routinely monitor blood pressure at home as typically stable.  He does endorse making diet  modifications since discharge and trying to increase daily activity.  He has since had follow-up with cardiology and has an additional follow-up scheduled 8/11 and if remains stable, plan on pursuing cardiac cath.  No further concerns at this time.      ROS:   14 system review of systems performed and negative with exception of no complaints  PMH:  Past Medical History:  Diagnosis Date   Arthritis    Diabetes mellitus without complication ([place])    Hyperlipidemia    Hypertension    Stroke ([place]) 05/2020    PSH:  Past Surgical History:  Procedure Laterality Date   TONSILLECTOMY      Social History:  Social History   Socioeconomic History   Marital status: Married    Spouse name: [name]   Number of children: 4   Years of education: Not on file   Highest education level: Not on file  Occupational History   Not on file  Tobacco Use   Smoking status: Former    Pack years: 0.00   Smokeless tobacco: Never   Tobacco comments:    Quit in 2017   Vaping Use   Vaping Use: Never used  Substance and Sexual Activity   Alcohol use: Yes    Comment: Socially   Drug use: Never   Sexual activity: Not on file  Other Topics Concern   Not on file  Social History Narrative   Lives with wife   Social Determinants of Health   Financial Resource Strain: Not on file  Food Insecurity: Not on file  Transportation Needs: Not on file  Physical Activity: Not on  file  Stress: Not on file  Social Connections: Not on file  Intimate Partner Violence: Not on file    Family History:  Family History  Problem Relation Age of Onset   Diabetes Mother    CAD Mother    Heart attack Mother        x 2   COPD Father    Colon cancer Neg Hx    Esophageal cancer Neg Hx    Rectal cancer Neg Hx    Stomach cancer Neg Hx     Medications:   Current Outpatient Medications on File Prior to Visit  Medication Sig Dispense Refill   apixaban (ELIQUIS) 5 MG TABS tablet Take 1 tablet (5 mg total) by  mouth 2 (two) times daily. 60 tablet 2   carbamide peroxide (DEBROX) 6.5 % OTIC solution Place 5 drops into both ears 2 (two) times daily. 15 mL 3   carvedilol (COREG) 3.125 MG tablet Take 1 tablet (3.125 mg total) by mouth 2 (two) times daily with a meal. 60 tablet 1   Continuous Blood Gluc Sensor (FREESTYLE LIBRE 14 DAY SENSOR) MISC 1 Device by Does not apply route every 14 (fourteen) days. 6 each 3   Dulaglutide (TRULICITY) 1.5 JS/2.8BT SOPN Inject 1.5 mg into the skin once a week. (Patient taking differently: Inject 1.5 mg into the skin once a week. Sundays) 6 mL 3   FARXIGA 10 MG TABS tablet TAKE ONE TABLET BY MOUTH DAILY 30 tablet 3   glucose blood test strip 1 each by Other route 2 (two) times daily. And lancets 2/day 200 each 3   insulin degludec (TRESIBA FLEXTOUCH) 100 UNIT/ML FlexTouch Pen Inject 40 Units into the skin daily. 15 mL 3   insulin lispro (HUMALOG KWIKPEN) 100 UNIT/ML KwikPen Inject 0.05-0.1 mLs (5-10 Units total) into the skin 3 (three) times daily with meals. And pen needles 4/day 15 mL 11   Insulin Pen Needle (PEN NEEDLES) 32G X 4 MM MISC 1 Package by Does not apply route 4 (four) times daily. 360 each 2   losartan (COZAAR) 25 MG tablet Take 25 mg by mouth daily.     metFORMIN (GLUCOPHAGE-XR) 500 MG 24 hr tablet Take 1 tablet (500 mg total) by mouth 2 (two) times daily. 180 tablet 3   rosuvastatin (CRESTOR) 40 MG tablet Take 1 tablet (40 mg total) by mouth daily. 30 tablet 1   sildenafil (VIAGRA) 100 MG tablet TAKE ONE TABLET BY MOUTH DAILY AS NEEDED FOR ERECTILE DYSFUNCTION (Patient taking differently: Take 100 mg by mouth as needed for erectile dysfunction.) 30 tablet 2   No current facility-administered medications on file prior to visit.    Allergies:  No Known Allergies    OBJECTIVE:  Physical Exam  Vitals:   07/13/20 1053  BP: 106/77  Pulse: (!) 102  Weight: 164 lb (74.4 kg)  Height: 5\' 6"  (1.676 m)   Body mass index is 26.47 kg/m. No results  found.  Post stroke PHQ 2/9 Depression screen PHQ 2/9 06/07/2020  Decreased Interest 0  Down, Depressed, Hopeless 0  PHQ - 2 Score 0     General: well developed, well nourished, very pleasant middle-age Caucasian male seated, in no evident distress Head: head normocephalic and atraumatic.   Neck: supple with no carotid or supraclavicular bruits Cardiovascular: regular rate and rhythm, no murmurs Musculoskeletal: no deformity Skin:  no rash/petichiae Vascular:  Normal pulses all extremities   Neurologic Exam Mental Status: Awake and fully alert.  Fluent speech and  language.  Oriented to place and time. Recent and remote memory intact. Attention span, concentration and fund of knowledge appropriate. Mood and affect appropriate.  Cranial Nerves: Fundoscopic exam reveals sharp disc margins. Pupils equal, briskly reactive to light. Extraocular movements full without nystagmus. Visual fields full to confrontation. Hearing intact. Facial sensation intact. Face, tongue, palate moves normally and symmetrically.  Motor: Normal bulk and tone. Normal strength in all tested extremity muscles Sensory.: intact to touch , pinprick , position and vibratory sensation.  Coordination: Rapid alternating movements normal in all extremities. Finger-to-nose and heel-to-shin performed accurately bilaterally. Gait and Station: Arises from chair without difficulty. Stance is normal. Gait demonstrates normal stride length and balance with use of cane (d/t arthritis).  Reflexes: 1+ and symmetric. Toes downgoing.     NIHSS  0 Modified Rankin  0      ASSESSMENT: John [name] is a 60 y.o. 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  Vascular risk factors include HTN, HLD, DM, R>L carotid stenosis, prior cerebellar stroke on imaging, new diagnosis of cardiomyopathy, probable CAD and former tobacco use.       PLAN:  Right frontal stroke:  Recovered well without residual deficit.   Continue Eliquis (apixaban) daily  and Crestor 20 mg daily for secondary stroke prevention.   Discussed secondary stroke prevention measures and importance of close PCP follow up for aggressive stroke risk factor management  Cardiomyopathy, new dx: EF 30%; per recent 2D echo, distal septal and apical wall motion abnormality with moderate LV dysfunction as well as swelling in the apex with increased risk of thrombus formation -cardiology recommended initiating Eliquis for secondary stroke prevention in setting of new diagnosis of cardiomyopathy. Has f/u visit with cards 8/11 and per note, if remains stable from stroke standpoint, will proceed with cardiac cath HTN: BP goal <130/90.  Stable on current regimen per PCP HLD: LDL goal <70. Recent LDL 64 on Crestor 20 mg daily per PCP.  DMII: A1c goal<7.0. Recent A1c 7.9.  Routinely follows with endocrinology    Follow up in 6 months or call earlier if needed   CC:  [place] provider: Dr. Leonie Man PCP: Libby Maw, MD    I spent 43 minutes of face-to-face and non-face-to-face time with patient.  This included previsit chart review including review of recent hospitalization, lab review, study review, electronic health record documentation, and patient education regarding recent stroke including etiology, secondary stroke prevention measures and aggressive stroke risk factor management, future follow-up with cardiology and answered all other questions to patient satisfaction   Frann Rider, AGNP-BC  [facility] [address] [place], [place] [address]  Phone [phone] Fax [phone] Note: This document was prepared with digital dictation and possible smart phrase technology. Any transcriptional errors that result from this process are unintentional.

## 2020-07-13 NOTE — Patient Instructions (Signed)
Continue Eliquis (apixaban) daily  and Crestor 20 mg daily for secondary stroke prevention  Continue to follow up with PCP regarding cholesterol diabetes and blood pressure management  Maintain strict control of hypertension with blood pressure goal below 130/90, diabetes with hemoglobin A1c goal below 7% and cholesterol with LDL cholesterol (bad cholesterol) goal below 70 mg/dL.   Ensure you follow-up with cardiology as scheduled as well as ongoing management of Eliquis      Followup in the future with me in 6 months or call earlier if needed       Thank you for coming to see [place] at [facility]. I hope we have been able to provide you high quality care today.  You may receive a patient satisfaction survey over the next few weeks. We would appreciate your feedback and comments so that we may continue to improve ourselves and the health of our patients.

## 2020-07-14 NOTE — Progress Notes (Signed)
I agree with the above plan 

## 2020-07-19 ENCOUNTER — Other Ambulatory Visit: Payer: Self-pay | Admitting: Endocrinology

## 2020-07-19 ENCOUNTER — Other Ambulatory Visit: Payer: Self-pay | Admitting: Family Medicine

## 2020-07-19 DIAGNOSIS — E1139 Type 2 diabetes mellitus with other diabetic ophthalmic complication: Secondary | ICD-10-CM

## 2020-07-19 DIAGNOSIS — M79605 Pain in left leg: Secondary | ICD-10-CM

## 2020-08-11 ENCOUNTER — Other Ambulatory Visit: Payer: Self-pay

## 2020-08-11 ENCOUNTER — Encounter: Payer: Self-pay | Admitting: Physician Assistant

## 2020-08-11 ENCOUNTER — Ambulatory Visit (INDEPENDENT_AMBULATORY_CARE_PROVIDER_SITE_OTHER): Payer: Managed Care, Other (non HMO) | Admitting: Physician Assistant

## 2020-08-11 VITALS — BP 122/80 | HR 86 | Ht 66.0 in | Wt 162.4 lb

## 2020-08-11 DIAGNOSIS — I10 Essential (primary) hypertension: Secondary | ICD-10-CM | POA: Diagnosis not present

## 2020-08-11 DIAGNOSIS — Z79899 Other long term (current) drug therapy: Secondary | ICD-10-CM | POA: Diagnosis not present

## 2020-08-11 DIAGNOSIS — E785 Hyperlipidemia, unspecified: Secondary | ICD-10-CM

## 2020-08-11 DIAGNOSIS — I429 Cardiomyopathy, unspecified: Secondary | ICD-10-CM

## 2020-08-11 DIAGNOSIS — Z8673 Personal history of transient ischemic attack (TIA), and cerebral infarction without residual deficits: Secondary | ICD-10-CM

## 2020-08-11 DIAGNOSIS — E119 Type 2 diabetes mellitus without complications: Secondary | ICD-10-CM

## 2020-08-11 MED ORDER — CARVEDILOL 6.25 MG PO TABS: 6.2500 mg | ORAL_TABLET | Freq: Two times a day (BID) | ORAL | 3 refills | Status: DC

## 2020-08-11 NOTE — Patient Instructions (Addendum)
Medication Instructions:  HOLD- Eliquis 2 days prior to your CATH HOLD- Metformin day of your procedure and 2 days after your procedure HOLD- Losartan day of your procedure  *If you need a refill on your cardiac medications before your next appointment, please call your pharmacy*   Lab Work: CBC and BMP Today  If you have labs (blood work) drawn today and your tests are completely normal, you will receive your results only by: [place] (if you have MyChart) OR A paper copy in the mail If you have any lab test that is abnormal or we need to change your treatment, we will call you to review the results.   Testing/Procedures: Your physician has requested that you have a left heart cardiac catheterization. Cardiac catheterization is used to diagnose and/or treat various heart conditions. Doctors may recommend this procedure for a number of different reasons. The most common reason is to evaluate chest pain. Chest pain can be a symptom of coronary artery disease (CAD), and cardiac catheterization can show whether plaque is narrowing or blocking your heart's arteries. This procedure is also used to evaluate the valves, as well as measure the blood flow and oxygen levels in different parts of your heart. For further information please visit [email or web address]. Please follow instruction sheet, as given.   Follow-Up: At [facility], you and your health needs are our priority.  As part of our continuing mission to provide you with exceptional heart care, we have created designated Provider Care Teams.  These Care Teams include your primary Cardiologist (physician) and Advanced Practice Providers (APPs -  Physician Assistants and Nurse Practitioners) who all work together to provide you with the care you need, when you need it.  We recommend signing up for the patient portal called "MyChart".  Sign up information is provided on this After Visit Summary.  MyChart is used to connect with  patients for Virtual Visits (Telemedicine).  Patients are able to view lab/test results, encounter notes, upcoming appointments, etc.  Non-urgent messages can be sent to your provider as well.   To learn more about what you can do with MyChart, go to [email or web address].    Your next appointment:   Keep appointment in September 20th @ 12:00 pm   Other Instructions  [place] [address] Dept: [phone] Loc: [phone]  John [name]  08/11/2020  You are scheduled for a Cardiac Catheterization on Thursday, August 18 with Dr. Peter Martinique.  1. Please arrive at the [facility] (Main Entrance A) at [facility]: [address] [place], [place] [address] at 10:00 AM (This time is two hours before your procedure to ensure your preparation). Free valet parking service is available.   Special note: Every effort is made to have your procedure done on time. Please understand that emergencies sometimes delay scheduled procedures.  2. Diet: Do not eat solid foods after midnight.  The patient may have clear liquids until 5am upon the day of the procedure.  3. Labs: You will need to have blood drawn on Thursday, August 11 at [facility]  Open: 8am - 5pm (Lunch 12:30 - 1:30)   Phone: [phone]. You do not need to be fasting.  4. Medication instructions in preparation for your procedure:   Contrast Allergy: No   Stop taking Eliquis (Apixiban) on Tuesday, August 16.  Stop taking, Cozaar (Losartan) Thursday, August 18,  Do not take Diabetes Med Glucophage (Metformin) on the day of the procedure and HOLD 48 HOURS AFTER THE PROCEDURE.  On the morning of your procedure, take your any morning medicines NOT listed above.  You may use sips of water.  5. Plan for one night stay--bring personal belongings. 6. Bring a current list  of your medications and current insurance cards. 7. You MUST have a responsible person to drive you home. 8. Someone MUST be with you the first 24 hours after you arrive home or your discharge will be delayed. 9. Please wear clothes that are easy to get on and off and wear slip-on shoes.  Thank you for allowing [place] to care for you!   -- [place] [facility]

## 2020-08-11 NOTE — H&P (View-Only) (Signed)
Cardiology Office Note:    Date:  08/14/2020   ID:  John [name], DOB July 28, 1960, MRN [id number]  PCP:  John Maw, MD   [facility] Providers Cardiologist:  John Ruths, MD     Referring MD: John [name],*   Chief Complaint  Patient presents with   Follow-up    Seen for Dr. Stanford [name]    History of Present Illness:    John [name] is a 60 y.o. male with a hx of hypertension, hyperlipidemia, DM2 and recent CVA.  Patient was admitted on 05/31/2020 with left upper extremity weakness and found to have acute CVA in the posterior right parietal lobe.  Echocardiogram showed atypical wall motion abnormality with reduced LV function.  There was no obvious thrombus however there is swirling in the apex with increased risk for thrombus formation.  Due to the recent CVA, it was recommended to continue medical management with low-dose carvedilol and losartan along with Crestor.  Titrate medication as outpatient.  We will consider cardiac catheterization in 8 to 12 weeks after he has recovered from CVA given multiple risk factors and wall motion abnormality seen on the echocardiogram.  Patient was felt likely had a previous infarct however did not have any anginal symptoms.  Given the swirling in the apex, there is some concern that his CVA may be cardioembolic, therefore it was recommended for the patient to start on 5 mg twice a day of Eliquis.  I last saw the patient on 06/20/2020 at which time he was still recovering from the stroke.  Patient returns today for follow-up and potentially consider cardiac catheterization.  He has been doing well since the last office visit and eager to proceed with cardiac catheterization as previously recommended in the hospital.  He continues to denies any chest pain.  Blood pressure stable on current therapy.  Past Medical History:  Diagnosis Date   Arthritis    Diabetes mellitus without complication ([place])    Hyperlipidemia     Hypertension    Stroke ([place]) 05/2020    Past Surgical History:  Procedure Laterality Date   TONSILLECTOMY      Current Medications: Current Meds  Medication Sig   apixaban (ELIQUIS) 5 MG TABS tablet Take 1 tablet (5 mg total) by mouth 2 (two) times daily.   carbamide peroxide (DEBROX) 6.5 % OTIC solution Place 5 drops into both ears 2 (two) times daily. (Patient taking differently: Place 5 drops into both ears 2 (two) times daily as needed (ear wax).)   Continuous Blood Gluc Sensor (FREESTYLE LIBRE 14 DAY SENSOR) MISC 1 Device by Does not apply route every 14 (fourteen) days.   Dulaglutide (TRULICITY) 1.5 0000000 SOPN Inject 1.5 mg into the skin once a week. (Patient taking differently: Inject 1.5 mg into the skin once a week. Sundays)   FARXIGA 10 MG TABS tablet TAKE ONE TABLET BY MOUTH DAILY   glucose blood test strip 1 each by Other route 2 (two) times daily. And lancets 2/day   insulin degludec (TRESIBA FLEXTOUCH) 100 UNIT/ML FlexTouch Pen Inject 40 Units into the skin daily.   insulin lispro (HUMALOG KWIKPEN) 100 UNIT/ML KwikPen Inject 0.05-0.1 mLs (5-10 Units total) into the skin 3 (three) times daily with meals. And pen needles 4/day (Patient not taking: Reported on 08/12/2020)   Insulin Pen Needle (PEN NEEDLES) 32G X 4 MM MISC 1 Package by Does not apply route 4 (four) times daily.   losartan (COZAAR) 25 MG tablet Take 25  mg by mouth daily.   metFORMIN (GLUCOPHAGE-XR) 500 MG 24 hr tablet TAKE ONE TABLET BY MOUTH TWICE A DAY   rosuvastatin (CRESTOR) 40 MG tablet Take 1 tablet (40 mg total) by mouth daily.   sildenafil (VIAGRA) 100 MG tablet TAKE ONE TABLET BY MOUTH DAILY AS NEEDED FOR ERECTILE DYSFUNCTION (Patient taking differently: Take 100 mg by mouth as needed for erectile dysfunction.)   [DISCONTINUED] carvedilol (COREG) 3.125 MG tablet Take 1 tablet (3.125 mg total) by mouth 2 (two) times daily with a meal.     Allergies:   Patient has no known allergies.   Social History    Socioeconomic History   Marital status: Married    Spouse name: John [name]   Number of children: 4   Years of education: Not on file   Highest education level: Not on file  Occupational History   Not on file  Tobacco Use   Smoking status: Former   Smokeless tobacco: Never   Tobacco comments:    Quit in 2017   Vaping Use   Vaping Use: Never used  Substance and Sexual Activity   Alcohol use: Yes    Comment: Socially   Drug use: Never   Sexual activity: Not on file  Other Topics Concern   Not on file  Social History Narrative   Lives with wife   Social Determinants of Health   Financial Resource Strain: Not on file  Food Insecurity: Not on file  Transportation Needs: Not on file  Physical Activity: Not on file  Stress: Not on file  Social Connections: Not on file     Family History: The patient's family history includes CAD in his mother; COPD in his father; Diabetes in his mother; Heart attack in his mother. There is no history of Colon cancer, Esophageal cancer, Rectal cancer, or Stomach cancer.  ROS:   Please see the history of present illness.     All other systems reviewed and are negative.  EKGs/Labs/Other Studies Reviewed:    The following studies were reviewed today:  Echo 06/02/2020 1. Left ventricular ejection fraction, by estimation, is 30 to 35%. The  left ventricle has moderately decreased function. The left ventricle  demonstrates regional wall motion abnormalities (see scoring  diagram/findings for description). There is  akinesis of the left ventricular, mid inferoseptal wall and anteroseptal  wall. There is akinesis of the left ventricular, apical septal wall,  anterior wall and inferior wall. There is akinesis of the left  ventricular, apical segment.   EKG:  EKG is ordered today.  The ekg ordered today demonstrates normal sinus rhythm with poor R wave progression in anterior leads.  Possible T wave inversion in the anterior leads as well.  Recent  Labs: 06/01/2020: ALT 18 08/11/2020: BUN 27; Creatinine, Ser 1.34; Hemoglobin 14.3; Platelets 352; Potassium 4.7; Sodium 140  Recent Lipid Panel    Component Value Date/Time   CHOL 118 06/01/2020 0513   TRIG 91 06/01/2020 0513   HDL 36 (L) 06/01/2020 0513   CHOLHDL 3.3 06/01/2020 0513   VLDL 18 06/01/2020 0513   LDLCALC 64 06/01/2020 0513     Risk Assessment/Calculations:           Physical Exam:    VS:  BP 122/80   Pulse 86   Ht '5\' 6"'$  (1.676 m)   Wt 162 lb 6.4 oz (73.7 kg)   BMI 26.21 kg/m     Wt Readings from Last 3 Encounters:  08/11/20 162 lb 6.4 oz (73.7  kg)  07/13/20 164 lb (74.4 kg)  07/01/20 162 lb (73.5 kg)     GEN:  Well nourished, well developed in no acute distress HEENT: Normal NECK: No JVD; No carotid bruits LYMPHATICS: No lymphadenopathy CARDIAC: RRR, no murmurs, rubs, gallops RESPIRATORY:  Clear to auscultation without rales, wheezing or rhonchi  ABDOMEN: Soft, non-tender, non-distended MUSCULOSKELETAL:  No edema; No deformity  SKIN: Warm and dry NEUROLOGIC:  Alert and oriented x 3 PSYCHIATRIC:  Normal affect   ASSESSMENT:    1. Cardiomyopathy, unspecified type ([place])   2. Medication management   3. H/O: CVA (cerebrovascular accident)   4. Essential hypertension   5. Hyperlipidemia LDL goal <70   6. Controlled type 2 diabetes mellitus without complication, without long-term current use of insulin (HCC)    PLAN:    In order of problems listed above:  Cardiomyopathy: Diagnosed when the patient was admitted with a stroke.  However previous echocardiogram showed anterior wall motion abnormality on top of LV dysfunction concerning for previous infarction.  Dr. Stanford [name] who saw the patient in the hospital recommended outpatient cardiac catheterization in 6 to 8 weeks after the patient recovered from the stroke.  He has adequately recovered at this time.  We discussed cardiac catheterization again, he is willing to proceed.  - Risk and benefit of  procedure explained to the patient who display clear understanding and agree to proceed. Discussed with patient possible procedural risk include bleeding, vascular injury, renal injury, arrythmia, MI, stroke and loss of limb or life.  History of CVA: Recovering since recent discharge.  He walks around with a walker however has good balance.  Due to concern of cardioembolic source, he was started on Eliquis.  We will need to hold Eliquis for 2 days prior to cardiac catheterization and restart afterward  Hypertension: Blood pressure stable  Hyperlipidemia: Continue Crestor  DM2: Managed by primary care provider        Medication Adjustments/Labs and Tests Ordered: Current medicines are reviewed at length with the patient today.  Concerns regarding medicines are outlined above.  Orders Placed This Encounter  Procedures   CBC   Basic Metabolic Panel (BMET)   EKG 12-Lead   Meds ordered this encounter  Medications   carvedilol (COREG) 6.25 MG tablet    Sig: Take 1 tablet (6.25 mg total) by mouth 2 (two) times daily with a meal.    Dispense:  180 tablet    Refill:  3    Patient Instructions  Medication Instructions:  HOLD- Eliquis 2 days prior to your CATH HOLD- Metformin day of your procedure and 2 days after your procedure HOLD- Losartan day of your procedure  *If you need a refill on your cardiac medications before your next appointment, please call your pharmacy*   Lab Work: CBC and BMP Today  If you have labs (blood work) drawn today and your tests are completely normal, you will receive your results only by: MyChart Message (if you have MyChart) OR A paper copy in the mail If you have any lab test that is abnormal or we need to change your treatment, we will call you to review the results.   Testing/Procedures: Your physician has requested that you have a left heart cardiac catheterization. Cardiac catheterization is used to diagnose and/or treat various heart  conditions. Doctors may recommend this procedure for a number of different reasons. The most common reason is to evaluate chest pain. Chest pain can be a symptom of coronary artery disease (CAD), and  cardiac catheterization can show whether plaque is narrowing or blocking your heart's arteries. This procedure is also used to evaluate the valves, as well as measure the blood flow and oxygen levels in different parts of your heart. For further information please visit [email or web address]. Please follow instruction sheet, as given.   Follow-Up: At [facility], you and your health needs are our priority.  As part of our continuing mission to provide you with exceptional heart care, we have created designated Provider Care Teams.  These Care Teams include your primary Cardiologist (physician) and Advanced Practice Providers (APPs -  Physician Assistants and Nurse Practitioners) who all work together to provide you with the care you need, when you need it.  We recommend signing up for the patient portal called "MyChart".  Sign up information is provided on this After Visit Summary.  MyChart is used to connect with patients for Virtual Visits (Telemedicine).  Patients are able to view lab/test results, encounter notes, upcoming appointments, etc.  Non-urgent messages can be sent to your provider as well.   To learn more about what you can do with MyChart, go to [email or web address].    Your next appointment:   Keep appointment in September 20th @ 12:00 pm   Other Instructions  [facility] [place] [address] Dept: [phone] Loc: [phone]  ADISON LAROWE [name]  08/11/2020  You are scheduled for a Cardiac Catheterization on Thursday, August 18 with Dr. Peter [place].  1. Please arrive at the [facility] (Main Entrance A) at [facility]: [address] [place], [place] [address] at  10:00 AM (This time is two hours before your procedure to ensure your preparation). Free valet parking service is available.   Special note: Every effort is made to have your procedure done on time. Please understand that emergencies sometimes delay scheduled procedures.  2. Diet: Do not eat solid foods after midnight.  The patient may have clear liquids until 5am upon the day of the procedure.  3. Labs: You will need to have blood drawn on Thursday, August 11 at [place]  Open: 8am - 5pm (Lunch 12:30 - 1:30)   Phone: [phone]. You do not need to be fasting.  4. Medication instructions in preparation for your procedure:   Contrast Allergy: No   Stop taking Eliquis (Apixiban) on Tuesday, August 16.  Stop taking, Cozaar (Losartan) Thursday, August 18,  Do not take Diabetes Med Glucophage (Metformin) on the day of the procedure and HOLD 48 HOURS AFTER THE PROCEDURE.  On the morning of your procedure, take your any morning medicines NOT listed above.  You may use sips of water.  5. Plan for one night stay--bring personal belongings. 6. Bring a current list of your medications and current insurance cards. 7. You MUST have a responsible person to drive you home. 8. Someone MUST be with you the first 24 hours after you arrive home or your discharge will be delayed. 9. Please wear clothes that are easy to get on and off and wear slip-on shoes.  Thank you for allowing [place] to care for you!   -- [facility]    Signed, Almyra Deforest, [place]  08/14/2020 12:01 AM    [place]

## 2020-08-11 NOTE — Progress Notes (Signed)
Cardiology Office Note:    Date:  08/14/2020   ID:  John [name], DOB [birth year], MRN [id number]  PCP:  John Maw, MD   [facility] Providers Cardiologist:  John Ruths, MD     Referring MD: John [name],*   Chief Complaint  Patient presents with   Follow-up    Seen for [name]    History of Present Illness:    John [name] is a 60 y.o. male with a hx of hypertension, hyperlipidemia, DM2 and recent CVA.  Patient was admitted on 05/31/2020 with left upper extremity weakness and found to have acute CVA in the posterior right parietal lobe.  Echocardiogram showed atypical wall motion abnormality with reduced LV function.  There was no obvious thrombus however there is swirling in the apex with increased risk for thrombus formation.  Due to the recent CVA, it was recommended to continue medical management with low-dose carvedilol and losartan along with Crestor.  Titrate medication as outpatient.  We will consider cardiac catheterization in 8 to 12 weeks after he has recovered from CVA given multiple risk factors and wall motion abnormality seen on the echocardiogram.  Patient was felt likely had a previous infarct however did not have any anginal symptoms.  Given the swirling in the apex, there is some concern that his CVA may be cardioembolic, therefore it was recommended for the patient to start on 5 mg twice a day of Eliquis.  I last saw the patient on 06/20/2020 at which time he was still recovering from the stroke.  Patient returns today for follow-up and potentially consider cardiac catheterization.  He has been doing well since the last office visit and eager to proceed with cardiac catheterization as previously recommended in the hospital.  He continues to denies any chest pain.  Blood pressure stable on current therapy.  Past Medical History:  Diagnosis Date   Arthritis    Diabetes mellitus without complication ([place])    Hyperlipidemia     Hypertension    Stroke ([place]) 05/2020    Past Surgical History:  Procedure Laterality Date   TONSILLECTOMY      Current Medications: Current Meds  Medication Sig   apixaban (ELIQUIS) 5 MG TABS tablet Take 1 tablet (5 mg total) by mouth 2 (two) times daily.   carbamide peroxide (DEBROX) 6.5 % OTIC solution Place 5 drops into both ears 2 (two) times daily. (Patient taking differently: Place 5 drops into both ears 2 (two) times daily as needed (ear wax).)   Continuous Blood Gluc Sensor (FREESTYLE LIBRE 14 DAY SENSOR) MISC 1 Device by Does not apply route every 14 (fourteen) days.   Dulaglutide (TRULICITY) 1.5 0000000 SOPN Inject 1.5 mg into the skin once a week. (Patient taking differently: Inject 1.5 mg into the skin once a week. Sundays)   FARXIGA 10 MG TABS tablet TAKE ONE TABLET BY MOUTH DAILY   glucose blood test strip 1 each by Other route 2 (two) times daily. And lancets 2/day   insulin degludec (TRESIBA FLEXTOUCH) 100 UNIT/ML FlexTouch Pen Inject 40 Units into the skin daily.   insulin lispro (HUMALOG KWIKPEN) 100 UNIT/ML KwikPen Inject 0.05-0.1 mLs (5-10 Units total) into the skin 3 (three) times daily with meals. And pen needles 4/day (Patient not taking: Reported on 08/12/2020)   Insulin Pen Needle (PEN NEEDLES) 32G X 4 MM MISC 1 Package by Does not apply route 4 (four) times daily.   losartan (COZAAR) 25 MG tablet Take 25  mg by mouth daily.   metFORMIN (GLUCOPHAGE-XR) 500 MG 24 hr tablet TAKE ONE TABLET BY MOUTH TWICE A DAY   rosuvastatin (CRESTOR) 40 MG tablet Take 1 tablet (40 mg total) by mouth daily.   sildenafil (VIAGRA) 100 MG tablet TAKE ONE TABLET BY MOUTH DAILY AS NEEDED FOR ERECTILE DYSFUNCTION (Patient taking differently: Take 100 mg by mouth as needed for erectile dysfunction.)   [DISCONTINUED] carvedilol (COREG) 3.125 MG tablet Take 1 tablet (3.125 mg total) by mouth 2 (two) times daily with a meal.     Allergies:   Patient has no known allergies.   Social History    Socioeconomic History   Marital status: Married    Spouse name: John [name]   Number of children: 4   Years of education: Not on file   Highest education level: Not on file  Occupational History   Not on file  Tobacco Use   Smoking status: Former   Smokeless tobacco: Never   Tobacco comments:    Quit in 2017   Vaping Use   Vaping Use: Never used  Substance and Sexual Activity   Alcohol use: Yes    Comment: Socially   Drug use: Never   Sexual activity: Not on file  Other Topics Concern   Not on file  Social History Narrative   Lives with wife   Social Determinants of Health   Financial Resource Strain: Not on file  Food Insecurity: Not on file  Transportation Needs: Not on file  Physical Activity: Not on file  Stress: Not on file  Social Connections: Not on file     Family History: The patient's family history includes CAD in his mother; COPD in his father; Diabetes in his mother; Heart attack in his mother. There is no history of Colon cancer, Esophageal cancer, Rectal cancer, or Stomach cancer.  ROS:   Please see the history of present illness.     All other systems reviewed and are negative.  EKGs/Labs/Other Studies Reviewed:    The following studies were reviewed today:  Echo 06/02/2020 1. Left ventricular ejection fraction, by estimation, is 30 to 35%. The  left ventricle has moderately decreased function. The left ventricle  demonstrates regional wall motion abnormalities (see scoring  diagram/findings for description). There is  akinesis of the left ventricular, mid inferoseptal wall and anteroseptal  wall. There is akinesis of the left ventricular, apical septal wall,  anterior wall and inferior wall. There is akinesis of the left  ventricular, apical segment.   EKG:  EKG is ordered today.  The ekg ordered today demonstrates normal sinus rhythm with poor R wave progression in anterior leads.  Possible T wave inversion in the anterior leads as well.  Recent  Labs: 06/01/2020: ALT 18 08/11/2020: BUN 27; Creatinine, Ser 1.34; Hemoglobin 14.3; Platelets 352; Potassium 4.7; Sodium 140  Recent Lipid Panel    Component Value Date/Time   CHOL 118 06/01/2020 0513   TRIG 91 06/01/2020 0513   HDL 36 (L) 06/01/2020 0513   CHOLHDL 3.3 06/01/2020 0513   VLDL 18 06/01/2020 0513   LDLCALC 64 06/01/2020 0513     Risk Assessment/Calculations:           Physical Exam:    VS:  BP 122/80   Pulse 86   Ht '5\' 6"'$  (1.676 m)   Wt 162 lb 6.4 oz (73.7 kg)   BMI 26.21 kg/m     Wt Readings from Last 3 Encounters:  08/11/20 162 lb 6.4 oz (73.7  kg)  07/13/20 164 lb (74.4 kg)  07/01/20 162 lb (73.5 kg)     GEN:  Well nourished, well developed in no acute distress HEENT: Normal NECK: No JVD; No carotid bruits LYMPHATICS: No lymphadenopathy CARDIAC: RRR, no murmurs, rubs, gallops RESPIRATORY:  Clear to auscultation without rales, wheezing or rhonchi  ABDOMEN: Soft, non-tender, non-distended MUSCULOSKELETAL:  No edema; No deformity  SKIN: Warm and dry NEUROLOGIC:  Alert and oriented x 3 PSYCHIATRIC:  Normal affect   ASSESSMENT:    1. Cardiomyopathy, unspecified type ([place])   2. Medication management   3. H/O: CVA (cerebrovascular accident)   4. Essential hypertension   5. Hyperlipidemia LDL goal <70   6. Controlled type 2 diabetes mellitus without complication, without long-term current use of insulin (HCC)    PLAN:    In order of problems listed above:  Cardiomyopathy: Diagnosed when the patient was admitted with a stroke.  However previous echocardiogram showed anterior wall motion abnormality on top of LV dysfunction concerning for previous infarction.  [name] who saw the patient in the hospital recommended outpatient cardiac catheterization in 6 to 8 weeks after the patient recovered from the stroke.  He has adequately recovered at this time.  We discussed cardiac catheterization again, he is willing to proceed.  - Risk and benefit of  procedure explained to the patient who display clear understanding and agree to proceed. Discussed with patient possible procedural risk include bleeding, vascular injury, renal injury, arrythmia, MI, stroke and loss of limb or life.  History of CVA: Recovering since recent discharge.  He walks around with a walker however has good balance.  Due to concern of cardioembolic source, he was started on Eliquis.  We will need to hold Eliquis for 2 days prior to cardiac catheterization and restart afterward  Hypertension: Blood pressure stable  Hyperlipidemia: Continue Crestor  DM2: Managed by primary care provider        Medication Adjustments/Labs and Tests Ordered: Current medicines are reviewed at length with the patient today.  Concerns regarding medicines are outlined above.  Orders Placed This Encounter  Procedures   CBC   Basic Metabolic Panel (BMET)   EKG 12-Lead   Meds ordered this encounter  Medications   carvedilol (COREG) 6.25 MG tablet    Sig: Take 1 tablet (6.25 mg total) by mouth 2 (two) times daily with a meal.    Dispense:  180 tablet    Refill:  3    Patient Instructions  Medication Instructions:  HOLD- Eliquis 2 days prior to your CATH HOLD- Metformin day of your procedure and 2 days after your procedure HOLD- Losartan day of your procedure  *If you need a refill on your cardiac medications before your next appointment, please call your pharmacy*   Lab Work: CBC and BMP Today  If you have labs (blood work) drawn today and your tests are completely normal, you will receive your results only by: MyChart Message (if you have MyChart) OR A paper copy in the mail If you have any lab test that is abnormal or we need to change your treatment, we will call you to review the results.   Testing/Procedures: Your physician has requested that you have a left heart cardiac catheterization. Cardiac catheterization is used to diagnose and/or treat various heart  conditions. Doctors may recommend this procedure for a number of different reasons. The most common reason is to evaluate chest pain. Chest pain can be a symptom of coronary artery disease (CAD), and  cardiac catheterization can show whether plaque is narrowing or blocking your heart's arteries. This procedure is also used to evaluate the valves, as well as measure the blood flow and oxygen levels in different parts of your heart. For further information please visit [email or web address]. Please follow instruction sheet, as given.   Follow-Up: At [facility], you and your health needs are our priority.  As part of our continuing mission to provide you with exceptional heart care, we have created designated Provider Care Teams.  These Care Teams include your primary Cardiologist (physician) and Advanced Practice Providers (APPs -  Physician Assistants and Nurse Practitioners) who all work together to provide you with the care you need, when you need it.  We recommend signing up for the patient portal called "MyChart".  Sign up information is provided on this After Visit Summary.  MyChart is used to connect with patients for Virtual Visits (Telemedicine).  Patients are able to view lab/test results, encounter notes, upcoming appointments, etc.  Non-urgent messages can be sent to your provider as well.   To learn more about what you can do with MyChart, go to [email or web address].    Your next appointment:   Keep appointment in September 20th @ 12:00 pm   Other Instructions  [place] [address] Dept: [phone] Loc: [phone]  John [name]  08/11/2020  You are scheduled for a Cardiac Catheterization on Thursday, August 18 with Dr. Peter Martinique.  1. Please arrive at the [facility] (Main Entrance A) at [facility]: [address] [place], [place] [address] at  10:00 AM (This time is two hours before your procedure to ensure your preparation). Free valet parking service is available.   Special note: Every effort is made to have your procedure done on time. Please understand that emergencies sometimes delay scheduled procedures.  2. Diet: Do not eat solid foods after midnight.  The patient may have clear liquids until 5am upon the day of the procedure.  3. Labs: You will need to have blood drawn on Thursday, August 11 at [place]  Open: 8am - 5pm (Lunch 12:30 - 1:30)   Phone: [phone]. You do not need to be fasting.  4. Medication instructions in preparation for your procedure:   Contrast Allergy: No   Stop taking Eliquis (Apixiban) on Tuesday, August 16.  Stop taking, Cozaar (Losartan) Thursday, August 18,  Do not take Diabetes Med Glucophage (Metformin) on the day of the procedure and HOLD 48 HOURS AFTER THE PROCEDURE.  On the morning of your procedure, take your any morning medicines NOT listed above.  You may use sips of water.  5. Plan for one night stay--bring personal belongings. 6. Bring a current list of your medications and current insurance cards. 7. You MUST have a responsible person to drive you home. 8. Someone MUST be with you the first 24 hours after you arrive home or your discharge will be delayed. 9. Please wear clothes that are easy to get on and off and wear slip-on shoes.  Thank you for allowing [place] to care for you!   -- [facility] Invasive Cardiovascular services    Signed, Almyra Deforest, [place]  08/14/2020 12:01 AM    [place]

## 2020-08-12 LAB — BASIC METABOLIC PANEL
BUN/Creatinine Ratio: 20 (ref 9–20)
BUN: 27 mg/dL — ABNORMAL HIGH (ref 6–24)
CO2: 21 mmol/L (ref 20–29)
Calcium: 9.7 mg/dL (ref 8.7–10.2)
Chloride: 104 mmol/L (ref 96–106)
Creatinine, Ser: 1.34 mg/dL — ABNORMAL HIGH (ref 0.76–1.27)
Glucose: 81 mg/dL (ref 65–99)
Potassium: 4.7 mmol/L (ref 3.5–5.2)
Sodium: 140 mmol/L (ref 134–144)
eGFR: 61 mL/min/{1.73_m2} (ref >59)

## 2020-08-12 LAB — CBC
Hematocrit: 43.5 % (ref 37.5–51.0)
Hemoglobin: 14.3 g/dL (ref 13.0–17.7)
MCH: 29 pg (ref 26.6–33.0)
MCHC: 32.9 g/dL (ref 31.5–35.7)
MCV: 88 fL (ref 79–97)
Platelets: 352 10*3/uL (ref 150–450)
RBC: 4.93 x10E6/uL (ref 4.14–5.80)
RDW: 13.3 % (ref 11.6–15.4)
WBC: 8.9 10*3/uL (ref 3.4–10.8)

## 2020-08-13 ENCOUNTER — Encounter: Payer: Self-pay | Admitting: Physician Assistant

## 2020-08-15 ENCOUNTER — Other Ambulatory Visit: Payer: Self-pay | Admitting: Physician Assistant

## 2020-08-15 MED ORDER — SODIUM CHLORIDE 0.9% FLUSH: 3.0000 mL | Freq: Two times a day (BID) | INTRAVENOUS | Status: DC

## 2020-08-17 ENCOUNTER — Telehealth: Payer: Self-pay | Admitting: *Deleted

## 2020-08-17 NOTE — Telephone Encounter (Addendum)
Cardiac catheterization scheduled at [facility] for: Thursday August 18, 2020 [facility] Main Entrance A [facility]) at: 10 AM   No solid food after midnight prior to cath, clear liquids until 5 AM day of procedure.  Medication instructions: Hold: Eliquis-none 08/16/20 until post procedure Metformin-day of procedure and 48 hours post procedure Farxiga-AM of procedure  Insulin-1/2 usual Insulin dose HS prior to procedure Pt reports he does not use Insulin in the mornings.   Except hold medications morning medications can be taken pre-cath with sips of water including aspirin 81 mg.    Confirmed patient has responsible adult to drive home post procedure and be with patient first 24 hours after arriving home.  Patients are allowed one visitor in the waiting room during the time they are at the hospital for their procedure. Both patient and visitor must wear a mask once they enter the hospital.   Patient reports does not currently have any symptoms concerning for COVID-19 and no household members with COVID-19 like illness.       Reviewed procedure/mask/visitor instructions with patient.

## 2020-08-18 ENCOUNTER — Encounter (HOSPITAL_COMMUNITY)
Admission: RE | Disposition: A | Discharge status: Home / Self Care | Payer: Self-pay | Source: Home / Self Care | Attending: Cardiology

## 2020-08-18 ENCOUNTER — Ambulatory Visit (HOSPITAL_COMMUNITY)
Admission: RE | Admit: 2020-08-18 | Discharge: 2020-08-18 | Disposition: A | Discharge status: Home / Self Care | Payer: Managed Care, Other (non HMO) | Attending: Cardiology | Admitting: Cardiology

## 2020-08-18 ENCOUNTER — Other Ambulatory Visit: Payer: Self-pay

## 2020-08-18 DIAGNOSIS — E119 Type 2 diabetes mellitus without complications: Secondary | ICD-10-CM | POA: Diagnosis not present

## 2020-08-18 DIAGNOSIS — Z79899 Other long term (current) drug therapy: Secondary | ICD-10-CM | POA: Diagnosis not present

## 2020-08-18 DIAGNOSIS — Z8673 Personal history of transient ischemic attack (TIA), and cerebral infarction without residual deficits: Secondary | ICD-10-CM | POA: Diagnosis not present

## 2020-08-18 DIAGNOSIS — Z8249 Family history of ischemic heart disease and other diseases of the circulatory system: Secondary | ICD-10-CM | POA: Diagnosis not present

## 2020-08-18 DIAGNOSIS — I10 Essential (primary) hypertension: Secondary | ICD-10-CM | POA: Diagnosis present

## 2020-08-18 DIAGNOSIS — Z87891 Personal history of nicotine dependence: Secondary | ICD-10-CM | POA: Diagnosis not present

## 2020-08-18 DIAGNOSIS — I2510 Atherosclerotic heart disease of native coronary artery without angina pectoris: Secondary | ICD-10-CM | POA: Insufficient documentation

## 2020-08-18 DIAGNOSIS — Z7984 Long term (current) use of oral hypoglycemic drugs: Secondary | ICD-10-CM | POA: Diagnosis not present

## 2020-08-18 DIAGNOSIS — Z7901 Long term (current) use of anticoagulants: Secondary | ICD-10-CM | POA: Diagnosis not present

## 2020-08-18 DIAGNOSIS — I5022 Chronic systolic (congestive) heart failure: Secondary | ICD-10-CM | POA: Diagnosis not present

## 2020-08-18 DIAGNOSIS — I2582 Chronic total occlusion of coronary artery: Secondary | ICD-10-CM | POA: Diagnosis not present

## 2020-08-18 DIAGNOSIS — I429 Cardiomyopathy, unspecified: Secondary | ICD-10-CM | POA: Diagnosis not present

## 2020-08-18 DIAGNOSIS — Z794 Long term (current) use of insulin: Secondary | ICD-10-CM | POA: Insufficient documentation

## 2020-08-18 DIAGNOSIS — Z833 Family history of diabetes mellitus: Secondary | ICD-10-CM | POA: Insufficient documentation

## 2020-08-18 DIAGNOSIS — I110 Hypertensive heart disease with heart failure: Secondary | ICD-10-CM | POA: Diagnosis not present

## 2020-08-18 DIAGNOSIS — E785 Hyperlipidemia, unspecified: Secondary | ICD-10-CM | POA: Diagnosis not present

## 2020-08-18 DIAGNOSIS — Z006 Encounter for examination for normal comparison and control in clinical research program: Secondary | ICD-10-CM

## 2020-08-18 DIAGNOSIS — E7800 Pure hypercholesterolemia, unspecified: Secondary | ICD-10-CM | POA: Diagnosis present

## 2020-08-18 DIAGNOSIS — I639 Cerebral infarction, unspecified: Secondary | ICD-10-CM | POA: Diagnosis present

## 2020-08-18 HISTORY — PX: LEFT HEART CATH AND CORONARY ANGIOGRAPHY: CATH118249

## 2020-08-18 LAB — GLUCOSE, CAPILLARY
Glucose-Capillary: 71 mg/dL (ref 70–99)
Glucose-Capillary: 66 mg/dL — ABNORMAL LOW (ref 70–99)
Glucose-Capillary: 76 mg/dL (ref 70–99)
Glucose-Capillary: 62 mg/dL — ABNORMAL LOW (ref 70–99)
Glucose-Capillary: 154 mg/dL — ABNORMAL HIGH (ref 70–99)

## 2020-08-18 SURGERY — LEFT HEART CATH AND CORONARY ANGIOGRAPHY
Anesthesia: LOCAL

## 2020-08-18 MED ORDER — HEPARIN (PORCINE) IN NACL 1000-0.9 UT/500ML-% IV SOLN
INTRAVENOUS | Status: AC
  Filled 2020-08-18: qty 1000

## 2020-08-18 MED ORDER — MIDAZOLAM HCL 2 MG/2ML IJ SOLN
INTRAMUSCULAR | Status: DC | PRN
  Administered 2020-08-18: 1 mg via INTRAVENOUS

## 2020-08-18 MED ORDER — SODIUM CHLORIDE 0.9% FLUSH: 3.0000 mL | INTRAVENOUS | Status: DC | PRN

## 2020-08-18 MED ORDER — ASPIRIN 81 MG PO CHEW: 81.0000 mg | CHEWABLE_TABLET | ORAL | Status: DC

## 2020-08-18 MED ORDER — HEPARIN SODIUM (PORCINE) 1000 UNIT/ML IJ SOLN
INTRAMUSCULAR | Status: AC
  Filled 2020-08-18: qty 1

## 2020-08-18 MED ORDER — SODIUM CHLORIDE 0.9 % WEIGHT BASED INFUSION
3.0000 mL/kg/h | INTRAVENOUS | Status: AC
  Administered 2020-08-18: 3 mL/kg/h via INTRAVENOUS

## 2020-08-18 MED ORDER — SODIUM CHLORIDE 0.9 % IV SOLN: 250.0000 mL | INTRAVENOUS | Status: DC | PRN

## 2020-08-18 MED ORDER — VERAPAMIL HCL 2.5 MG/ML IV SOLN
INTRAVENOUS | Status: AC
  Filled 2020-08-18: qty 2

## 2020-08-18 MED ORDER — VERAPAMIL HCL 2.5 MG/ML IV SOLN
INTRAVENOUS | Status: DC | PRN
  Administered 2020-08-18: 10 mL via INTRA_ARTERIAL

## 2020-08-18 MED ORDER — HEPARIN SODIUM (PORCINE) 1000 UNIT/ML IJ SOLN
INTRAMUSCULAR | Status: DC | PRN
  Administered 2020-08-18: 4000 [IU] via INTRAVENOUS

## 2020-08-18 MED ORDER — SODIUM CHLORIDE 0.9% FLUSH: 3.0000 mL | Freq: Two times a day (BID) | INTRAVENOUS | Status: DC

## 2020-08-18 MED ORDER — MIDAZOLAM HCL 2 MG/2ML IJ SOLN
INTRAMUSCULAR | Status: AC
  Filled 2020-08-18: qty 2

## 2020-08-18 MED ORDER — IOHEXOL 350 MG/ML SOLN
INTRAVENOUS | Status: DC | PRN
  Administered 2020-08-18: 40 mL via INTRA_ARTERIAL

## 2020-08-18 MED ORDER — LIDOCAINE HCL (PF) 1 % IJ SOLN
INTRAMUSCULAR | Status: DC | PRN
  Administered 2020-08-18: 2 mL

## 2020-08-18 MED ORDER — SODIUM CHLORIDE 0.9 % WEIGHT BASED INFUSION: 1.0000 mL/kg/h | INTRAVENOUS | Status: DC

## 2020-08-18 MED ORDER — DEXTROSE 50 % IV SOLN: 25.0000 mL | Freq: Once | INTRAVENOUS | Status: AC

## 2020-08-18 MED ORDER — LIDOCAINE HCL (PF) 1 % IJ SOLN
INTRAMUSCULAR | Status: AC
  Filled 2020-08-18: qty 30

## 2020-08-18 MED ORDER — FENTANYL CITRATE (PF) 100 MCG/2ML IJ SOLN
INTRAMUSCULAR | Status: DC | PRN
  Administered 2020-08-18: 25 ug via INTRAVENOUS

## 2020-08-18 MED ORDER — FENTANYL CITRATE (PF) 100 MCG/2ML IJ SOLN
INTRAMUSCULAR | Status: AC
  Filled 2020-08-18: qty 2

## 2020-08-18 MED ORDER — ONDANSETRON HCL 4 MG/2ML IJ SOLN: 4.0000 mg | Freq: Four times a day (QID) | INTRAMUSCULAR | Status: DC | PRN

## 2020-08-18 MED ORDER — DEXTROSE 50 % IV SOLN
INTRAVENOUS | Status: AC
  Administered 2020-08-18: 25 mL via INTRAVENOUS
  Filled 2020-08-18: qty 50

## 2020-08-18 MED ORDER — HEPARIN (PORCINE) IN NACL 1000-0.9 UT/500ML-% IV SOLN
INTRAVENOUS | Status: DC | PRN
  Administered 2020-08-18 (×2): 500 mL

## 2020-08-18 MED ORDER — ACETAMINOPHEN 325 MG PO TABS: 650.0000 mg | ORAL_TABLET | ORAL | Status: DC | PRN

## 2020-08-18 SURGICAL SUPPLY — 9 items
CATH 5FR JL3.5 JR4 ANG PIG MP (CATHETERS) ×2 IMPLANT
DEVICE RAD COMP TR BAND LRG (VASCULAR PRODUCTS) ×2 IMPLANT
GLIDESHEATH SLEND SS 6F .021 (SHEATH) ×2 IMPLANT
GUIDEWIRE INQWIRE 1.5J.035X260 (WIRE) ×1 IMPLANT
INQWIRE 1.5J .035X260CM (WIRE) ×2
KIT HEART LEFT (KITS) ×2 IMPLANT
PACK CARDIAC CATHETERIZATION (CUSTOM PROCEDURE TRAY) ×2 IMPLANT
TRANSDUCER W/STOPCOCK (MISCELLANEOUS) ×2 IMPLANT
TUBING CIL FLEX 10 FLL-RA (TUBING) ×2 IMPLANT

## 2020-08-18 NOTE — Progress Notes (Signed)
Patient CBG 62 at 1439, patient eating and drinking. Will recheck CBG.

## 2020-08-18 NOTE — Progress Notes (Signed)
[place] for cath. Plan for repeat lab on next follow up

## 2020-08-18 NOTE — Interval H&P Note (Signed)
History and Physical Interval Note:  08/18/2020 2:11 PM  Cory Munch II  has presented today for surgery, with the diagnosis of cardiomyopathy.  The various methods of treatment have been discussed with the patient and family. After consideration of risks, benefits and other options for treatment, the patient has consented to  Procedure(s): LEFT HEART CATH AND CORONARY ANGIOGRAPHY (N/A) as a surgical intervention.  The patient's history has been reviewed, patient examined, no change in status, stable for surgery.  I have reviewed the patient's chart and labs.  Questions were answered to the patient's satisfaction.    Cath Lab Visit (complete for each Cath Lab visit)  Clinical Evaluation Leading to the Procedure:   ACS: No.  Non-ACS:    Anginal Classification: CCS I  Anti-ischemic medical therapy: Minimal Therapy (1 class of medications)  Non-Invasive Test Results: No non-invasive testing performed  Prior CABG: No previous CABG       Collier Salina [facility] 08/18/2020 2:11 PM

## 2020-08-18 NOTE — Research (Signed)
IDENTIFY Informed Consent                  Subject Name: John [name]. [name]   Subject met inclusion and exclusion criteria.  The informed consent form, study requirements and expectations were reviewed with the subject and questions and concerns were addressed prior to the signing of the consent form.  The subject verbalized understanding of the trial requirements.  The subject agreed to participate in the IDENTIFY trial and signed the informed consent at 10:46AM on 08/18/20.  The informed consent was obtained prior to performance of any protocol-specific procedures for the subject.  A copy of the signed informed consent was given to the subject and a copy was placed in the subject's medical record.    Ledon Snare , Research Assistant

## 2020-08-19 ENCOUNTER — Encounter (HOSPITAL_COMMUNITY): Payer: Self-pay | Admitting: Cardiology

## 2020-09-08 ENCOUNTER — Encounter: Payer: Self-pay | Admitting: Family Medicine

## 2020-09-08 ENCOUNTER — Other Ambulatory Visit: Payer: Self-pay

## 2020-09-08 ENCOUNTER — Ambulatory Visit (INDEPENDENT_AMBULATORY_CARE_PROVIDER_SITE_OTHER): Payer: Managed Care, Other (non HMO) | Admitting: Family Medicine

## 2020-09-08 VITALS — BP 118/68 | HR 82 | Temp 97.5°F | Ht 66.0 in | Wt 167.4 lb

## 2020-09-08 DIAGNOSIS — I2510 Atherosclerotic heart disease of native coronary artery without angina pectoris: Secondary | ICD-10-CM | POA: Insufficient documentation

## 2020-09-08 DIAGNOSIS — Z23 Encounter for immunization: Secondary | ICD-10-CM | POA: Diagnosis not present

## 2020-09-08 DIAGNOSIS — Z0000 Encounter for general adult medical examination without abnormal findings: Secondary | ICD-10-CM

## 2020-09-08 DIAGNOSIS — G8929 Other chronic pain: Secondary | ICD-10-CM

## 2020-09-08 DIAGNOSIS — I10 Essential (primary) hypertension: Secondary | ICD-10-CM

## 2020-09-08 DIAGNOSIS — E7800 Pure hypercholesterolemia, unspecified: Secondary | ICD-10-CM | POA: Diagnosis not present

## 2020-09-08 DIAGNOSIS — H6123 Impacted cerumen, bilateral: Secondary | ICD-10-CM | POA: Diagnosis not present

## 2020-09-08 DIAGNOSIS — M25561 Pain in right knee: Secondary | ICD-10-CM

## 2020-09-08 DIAGNOSIS — M25562 Pain in left knee: Secondary | ICD-10-CM

## 2020-09-08 LAB — URINALYSIS, ROUTINE W REFLEX MICROSCOPIC
Bilirubin Urine: NEGATIVE
Hgb urine dipstick: NEGATIVE
Ketones, ur: NEGATIVE
Leukocytes,Ua: NEGATIVE
Nitrite: NEGATIVE
RBC / HPF: NONE SEEN (ref 0–?)
Specific Gravity, Urine: 1.02 (ref 1.000–1.030)
Total Protein, Urine: NEGATIVE
Urine Glucose: >=1000 — AB
Urobilinogen, UA: 0.2 (ref 0.0–1.0)
pH: 6 (ref 5.0–8.0)

## 2020-09-08 LAB — COMPREHENSIVE METABOLIC PANEL
ALT: 13 U/L (ref 0–53)
AST: 16 U/L (ref 0–37)
Albumin: 4 g/dL (ref 3.5–5.2)
Alkaline Phosphatase: 56 U/L (ref 39–117)
BUN: 26 mg/dL — ABNORMAL HIGH (ref 6–23)
CO2: 26 mEq/L (ref 19–32)
Calcium: 8.9 mg/dL (ref 8.4–10.5)
Chloride: 106 mEq/L (ref 96–112)
Creatinine, Ser: 1.33 mg/dL (ref 0.40–1.50)
GFR: 58.6 mL/min — ABNORMAL LOW (ref >60.00)
Glucose, Bld: 100 mg/dL — ABNORMAL HIGH (ref 70–99)
Potassium: 4.3 mEq/L (ref 3.5–5.1)
Sodium: 139 mEq/L (ref 135–145)
Total Bilirubin: 0.4 mg/dL (ref 0.2–1.2)
Total Protein: 6.3 g/dL (ref 6.0–8.3)

## 2020-09-08 LAB — CBC
HCT: 45.5 % (ref 39.0–52.0)
Hemoglobin: 15 g/dL (ref 13.0–17.0)
MCHC: 33.1 g/dL (ref 30.0–36.0)
MCV: 89.9 fl (ref 78.0–100.0)
Platelets: 254 10*3/uL (ref 150.0–400.0)
RBC: 5.06 Mil/uL (ref 4.22–5.81)
RDW: 14.8 % (ref 11.5–15.5)
WBC: 7 10*3/uL (ref 4.0–10.5)

## 2020-09-08 LAB — LIPID PANEL
Cholesterol: 169 mg/dL (ref 0–200)
HDL: 46.8 mg/dL (ref >39.00)
LDL Cholesterol: 97 mg/dL (ref 0–99)
NonHDL: 122.25
Total CHOL/HDL Ratio: 4
Triglycerides: 128 mg/dL (ref 0.0–149.0)
VLDL: 25.6 mg/dL (ref 0.0–40.0)

## 2020-09-08 MED ORDER — EZETIMIBE 10 MG PO TABS: 10.0000 mg | ORAL_TABLET | Freq: Every day | ORAL | 3 refills | Status: DC

## 2020-09-08 MED ORDER — DEBROX 6.5 % OT SOLN: 5.0000 [drp] | Freq: Two times a day (BID) | OTIC | 2 refills | Status: DC

## 2020-09-08 MED ORDER — DICLOFENAC SODIUM 1 % EX GEL: CUTANEOUS | 1 refills | Status: DC

## 2020-09-08 NOTE — Addendum Note (Signed)
Addended by: Jon Billings on: 09/08/2020 12:58 PM   Modules accepted: Orders

## 2020-09-08 NOTE — Progress Notes (Addendum)
Established Patient Office Visit  Subjective:  Patient ID: John [name], male    DOB: [birth year]  Age: 60 y.o. MRN: [id number]  CC:  Chief Complaint  Patient presents with   Follow-up    3 month f/u.  No concerns.  Wants to know if he can get an RX for Ibuprofen 800 mg.      HPI John [name] presents for follow-up of hypertension, elevated cholesterol, chronic bilateral knee pain and ceruminosis.  Blood pressures been well controlled on his current regimen carvedilol losartan.  Significant coronary artery disease and history of CHF.  Patient denies any chest pain shortness of breath dyspnea on exertion and sleeps on 1 pillow.  He is still working full-time as a Clinical research associate at [employer].  Follow-up is scheduled for cardiology.  Has not been using his Cerumenex drops as he should.  Promises he will do better requests ibuprofen refill for his bilateral knee pain.  Knees do not swell there is no locking or giving way.  He uses ibuprofen rarely.  He is taking Eliquis at this time status post  Past Medical History:  Diagnosis Date   Arthritis    Diabetes mellitus without complication ([place])    Hyperlipidemia    Hypertension    Stroke ([place]) 05/2020    Past Surgical History:  Procedure Laterality Date   LEFT HEART CATH AND CORONARY ANGIOGRAPHY N/A 08/18/2020   Procedure: LEFT HEART CATH AND CORONARY ANGIOGRAPHY;  Surgeon: Martinique, Peter M, MD;  Location: [place] [facility];  Service: Cardiovascular;  Laterality: N/A;   TONSILLECTOMY      Family History  Problem Relation Age of Onset   Diabetes Mother    CAD Mother    Heart attack Mother        x 2   COPD Father    Colon cancer Neg Hx    Esophageal cancer Neg Hx    Rectal cancer Neg Hx    Stomach cancer Neg Hx     Social History   Socioeconomic History   Marital status: Married    Spouse name: John [name]   Number of children: 4   Years of education: Not on file   Highest education level: Not on file  Occupational  History   Not on file  Tobacco Use   Smoking status: Former   Smokeless tobacco: Never   Tobacco comments:    Quit in 2017   Vaping Use   Vaping Use: Never used  Substance and Sexual Activity   Alcohol use: Yes    Comment: Socially   Drug use: Never   Sexual activity: Yes  Other Topics Concern   Not on file  Social History Narrative   Lives with wife   Social Determinants of Health   Financial Resource Strain: Not on file  Food Insecurity: Not on file  Transportation Needs: Not on file  Physical Activity: Not on file  Stress: Not on file  Social Connections: Not on file  Intimate Partner Violence: Not on file    Outpatient Medications Prior to Visit  Medication Sig Dispense Refill   acetaminophen (TYLENOL) 500 MG tablet Take 500 mg by mouth every 6 (six) hours as needed for moderate pain or headache.     apixaban (ELIQUIS) 5 MG TABS tablet Take 1 tablet (5 mg total) by mouth 2 (two) times daily. 60 tablet 2   Aspirin-Caffeine (BC FAST PAIN RELIEF PO) Take 1 packet by mouth daily as needed (pain).  carvedilol (COREG) 6.25 MG tablet Take 1 tablet (6.25 mg total) by mouth 2 (two) times daily with a meal. 180 tablet 3   Continuous Blood Gluc Sensor (FREESTYLE LIBRE 14 DAY SENSOR) MISC 1 Device by Does not apply route every 14 (fourteen) days. 6 each 3   Dulaglutide (TRULICITY) 1.5 QZ/0.0PQ SOPN Inject 1.5 mg into the skin once a week. (Patient taking differently: Inject 1.5 mg into the skin once a week. Sundays) 6 mL 3   FARXIGA 10 MG TABS tablet TAKE ONE TABLET BY MOUTH DAILY 30 tablet 3   glucose blood test strip 1 each by Other route 2 (two) times daily. And lancets 2/day 200 each 3   insulin degludec (TRESIBA FLEXTOUCH) 100 UNIT/ML FlexTouch Pen Inject 40 Units into the skin daily. 15 mL 3   Insulin Pen Needle (PEN NEEDLES) 32G X 4 MM MISC 1 Package by Does not apply route 4 (four) times daily. 360 each 2   Lidocaine 4 % PTCH Apply 1 patch topically daily as needed  (pain).     losartan (COZAAR) 25 MG tablet Take 25 mg by mouth daily.     metFORMIN (GLUCOPHAGE-XR) 500 MG 24 hr tablet TAKE ONE TABLET BY MOUTH TWICE A DAY 180 tablet 3   rosuvastatin (CRESTOR) 40 MG tablet Take 1 tablet (40 mg total) by mouth daily. 30 tablet 1   sildenafil (VIAGRA) 100 MG tablet TAKE ONE TABLET BY MOUTH DAILY AS NEEDED FOR ERECTILE DYSFUNCTION (Patient taking differently: Take 100 mg by mouth as needed for erectile dysfunction.) 30 tablet 2   carbamide peroxide (DEBROX) 6.5 % OTIC solution Place 5 drops into both ears 2 (two) times daily. (Patient not taking: Reported on 09/08/2020) 15 mL 3   insulin lispro (HUMALOG KWIKPEN) 100 UNIT/ML KwikPen Inject 0.05-0.1 mLs (5-10 Units total) into the skin 3 (three) times daily with meals. And pen needles 4/day (Patient not taking: No sig reported) 15 mL 11   Facility-Administered Medications Prior to Visit  Medication Dose Route Frequency Provider Last Rate Last Admin   sodium chloride flush (NS) 0.9 % injection 3 mL  3 mL Intravenous Q12H Meng, [place], PA        No Known Allergies  ROS Review of Systems  Constitutional: Negative.   HENT: Negative.    Eyes:  Negative for photophobia and visual disturbance.  Respiratory:  Negative for chest tightness, shortness of breath and wheezing.   Cardiovascular:  Negative for chest pain and palpitations.  Gastrointestinal: Negative.   Genitourinary: Negative.   Musculoskeletal:  Positive for arthralgias. Negative for gait problem and joint swelling.  Neurological:  Negative for speech difficulty and weakness.  Psychiatric/Behavioral: Negative.       Objective:    Physical Exam Vitals and nursing note reviewed.  Constitutional:      General: He is not in acute distress.    Appearance: Normal appearance. He is normal weight. He is not ill-appearing, toxic-appearing or diaphoretic.  HENT:     Head: Normocephalic and atraumatic.     Right Ear: Tympanic membrane, ear canal and external  ear normal.     Left Ear: Tympanic membrane, ear canal and external ear normal.     Mouth/Throat:     Mouth: Mucous membranes are moist.     Pharynx: Oropharynx is clear. No oropharyngeal exudate or posterior oropharyngeal erythema.  Eyes:     Extraocular Movements: Extraocular movements intact.     Conjunctiva/sclera: Conjunctivae normal.     Pupils: Pupils are equal,  round, and reactive to light.  Neck:     Vascular: No carotid bruit.  Cardiovascular:     Rate and Rhythm: Normal rate and regular rhythm.  Pulmonary:     Effort: Pulmonary effort is normal.     Breath sounds: Normal breath sounds.  Abdominal:     General: Bowel sounds are normal.  Musculoskeletal:     Cervical back: No rigidity or tenderness.     Right lower leg: No edema.     Left lower leg: No edema.  Lymphadenopathy:     Cervical: No cervical adenopathy.  Skin:    General: Skin is warm and dry.  Neurological:     Mental Status: He is alert and oriented to person, place, and time.  Psychiatric:        Mood and Affect: Mood normal.        Behavior: Behavior normal.    Pulse 82   Temp (!) 97.5 F (36.4 C) (Temporal)   Ht 5' 6" (1.676 [name])   Wt 167 lb 6.4 oz (75.9 kg)   SpO2 98%   BMI 27.02 kg/[name]  Wt Readings from Last 3 Encounters:  09/08/20 167 lb 6.4 oz (75.9 kg)  08/18/20 161 lb (73 kg)  08/11/20 162 lb 6.4 oz (73.7 kg)     Health Maintenance Due  Topic Date Due   PNEUMOCOCCAL POLYSACCHARIDE VACCINE AGE 35-64 HIGH RISK  Never done   Pneumococcal Vaccine 77-37 Years old (1 - PCV) Never done   OPHTHALMOLOGY EXAM  Never done   Hepatitis C Screening  Never done   TETANUS/TDAP  Never done   Zoster Vaccines- Shingrix (1 of 2) Never done    There are no preventive care reminders to display for this patient.  Lab Results  Component Value Date   TSH 1.54 10/08/2017   Lab Results  Component Value Date   WBC 8.9 08/11/2020   HGB 14.3 08/11/2020   HCT 43.5 08/11/2020   MCV 88 08/11/2020   PLT  352 08/11/2020   Lab Results  Component Value Date   NA 140 08/11/2020   K 4.7 08/11/2020   CO2 21 08/11/2020   GLUCOSE 81 08/11/2020   BUN 27 (H) 08/11/2020   CREATININE 1.34 (H) 08/11/2020   BILITOT 0.9 06/01/2020   ALKPHOS 65 06/01/2020   AST 19 06/01/2020   ALT 18 06/01/2020   PROT 6.4 (L) 06/01/2020   ALBUMIN 3.3 (L) 06/01/2020   CALCIUM 9.7 08/11/2020   ANIONGAP 9 06/02/2020   EGFR 61 08/11/2020   GFR 96.53 05/03/2020   Lab Results  Component Value Date   CHOL 118 06/01/2020   Lab Results  Component Value Date   HDL 36 (L) 06/01/2020   Lab Results  Component Value Date   LDLCALC 64 06/01/2020   Lab Results  Component Value Date   TRIG 91 06/01/2020   Lab Results  Component Value Date   CHOLHDL 3.3 06/01/2020   Lab Results  Component Value Date   HGBA1C 6.3 (A) 07/01/2020      Assessment & Plan:   Problem List Items Addressed This Visit       Cardiovascular and Mediastinum   Essential hypertension - Primary   Relevant Orders   CBC   Comprehensive metabolic panel   Urinalysis, Routine w reflex microscopic   Coronary artery disease involving native coronary artery of native heart without angina pectoris     Nervous and Auditory   Excessive cerumen in both ear canals  Relevant Medications   carbamide peroxide (DEBROX) 6.5 % OTIC solution     Other   Elevated LDL cholesterol level   Relevant Orders   Comprehensive metabolic panel   Lipid panel   Healthcare maintenance   Relevant Orders   Flu Vaccine QUAD 6+ mos PF IM (Fluarix Quad PF) (Completed)   Chronic pain of both knees   Relevant Medications   diclofenac Sodium (VOLTAREN) 1 % GEL   Other Relevant Orders   DG Knee Complete 4 Views Left   DG Knee Complete 4 Views Right    Meds ordered this encounter  Medications   carbamide peroxide (DEBROX) 6.5 % OTIC solution    Sig: Place 5 drops into both ears 2 (two) times daily.    Dispense:  15 mL    Refill:  2   diclofenac Sodium  (VOLTAREN) 1 % GEL    Sig: Rub a small grape sized dollop into knee up to 3 times daily as needed.    Dispense:  350 g    Refill:  1    Follow-up: Return in about 6 months (around 03/08/2021).  Continue all medicines above.  Will use Voltaren gel as needed for knee pain. Information given on chronic knee pain and coronary artery disease. Libby Maw, MD    9/8 addendum: Ldl cholesterol needs to be lower. Added Zetia.

## 2020-09-13 ENCOUNTER — Other Ambulatory Visit: Payer: Self-pay

## 2020-09-13 NOTE — Patient Outreach (Signed)
[place] [facility]) Care Management  09/13/2020  John [name] 10-Oct-1960 [id number]   Telephone outreach to patient to obtain mRS was successfully completed. MRS= 1   [place] Care Management Assistant

## 2020-09-17 NOTE — Progress Notes (Addendum)
HPI: Follow-up cardiomyopathy-patient admitted May 2022 with acute CVA.  Echocardiogram June 2022 showed echogenic region in the LV lateral apex which could possibly be mural thrombus versus prominent trabeculations in the setting of wall motion abnormalities.  Ejection fraction 40 to 45%.  There was grade 1 diastolic dysfunction.  Given recent CVA and swirling at the LV apex apixaban was added.  Follow-up echocardiogram June 2022 showed ejection fraction 30 to 35% with akinesis of the inferoseptal and anteroseptal wall.  Cardiac catheterization August 2022 showed 99% proximal LAD, 80% first marginal, 80% PDA 1 and 90% PDA 2.  The LAD was felt to be diffusely small.  It was felt the OM and PDA could potentially be causing ischemia but patient asymptomatic.  Since last seen patient denies dyspnea, chest pain, palpitations or syncope.  Current Outpatient Medications  Medication Sig Dispense Refill   acetaminophen (TYLENOL) 500 MG tablet Take 500 mg by mouth every 6 (six) hours as needed for moderate pain or headache.     apixaban (ELIQUIS) 5 MG TABS tablet Take 1 tablet (5 mg total) by mouth 2 (two) times daily. 60 tablet 2   Aspirin-Caffeine (BC FAST PAIN RELIEF PO) Take 1 packet by mouth daily as needed (pain).     carbamide peroxide (DEBROX) 6.5 % OTIC solution Place 5 drops into both ears 2 (two) times daily. 15 mL 2   carvedilol (COREG) 6.25 MG tablet Take 1 tablet (6.25 mg total) by mouth 2 (two) times daily with a meal. 180 tablet 3   Continuous Blood Gluc Sensor (FREESTYLE LIBRE 14 DAY SENSOR) MISC 1 Device by Does not apply route every 14 (fourteen) days. 6 each 3   diclofenac Sodium (VOLTAREN) 1 % GEL Rub a small grape sized dollop into knee up to 3 times daily as needed. 350 g 1   Dulaglutide (TRULICITY) 1.5 0000000 SOPN Inject 1.5 mg into the skin once a week. (Patient taking differently: Inject 1.5 mg into the skin once a week. Sundays) 6 mL 3   ezetimibe (ZETIA) 10 MG tablet Take 1  tablet (10 mg total) by mouth daily. 90 tablet 3   FARXIGA 10 MG TABS tablet TAKE ONE TABLET BY MOUTH DAILY 30 tablet 3   glucose blood test strip 1 each by Other route 2 (two) times daily. And lancets 2/day 200 each 3   insulin degludec (TRESIBA FLEXTOUCH) 100 UNIT/ML FlexTouch Pen Inject 40 Units into the skin daily. 15 mL 3   Insulin Pen Needle (PEN NEEDLES) 32G X 4 MM MISC 1 Package by Does not apply route 4 (four) times daily. 360 each 2   Lidocaine 4 % PTCH Apply 1 patch topically daily as needed (pain).     losartan (COZAAR) 25 MG tablet Take 25 mg by mouth daily.     metFORMIN (GLUCOPHAGE-XR) 500 MG 24 hr tablet TAKE ONE TABLET BY MOUTH TWICE A DAY 180 tablet 3   rosuvastatin (CRESTOR) 40 MG tablet Take 1 tablet (40 mg total) by mouth daily. 30 tablet 1   sildenafil (VIAGRA) 100 MG tablet TAKE ONE TABLET BY MOUTH DAILY AS NEEDED FOR ERECTILE DYSFUNCTION (Patient taking differently: Take 100 mg by mouth as needed for erectile dysfunction.) 30 tablet 2   Current Facility-Administered Medications  Medication Dose Route Frequency Provider Last Rate Last Admin   sodium chloride flush (NS) 0.9 % injection 3 mL  3 mL Intravenous Q12H Almyra Deforest, PA         Past Medical  History:  Diagnosis Date   Arthritis    Diabetes mellitus without complication ([place])    Hyperlipidemia    Hypertension    Stroke ([place]) 05/2020    Past Surgical History:  Procedure Laterality Date   LEFT HEART CATH AND CORONARY ANGIOGRAPHY N/A 08/18/2020   Procedure: LEFT HEART CATH AND CORONARY ANGIOGRAPHY;  Surgeon: Martinique, Peter M, MD;  Location: [place] [facility];  Service: Cardiovascular;  Laterality: N/A;   TONSILLECTOMY      Social History   Socioeconomic History   Marital status: Married    Spouse name: [name]   Number of children: 4   Years of education: Not on file   Highest education level: Not on file  Occupational History   Not on file  Tobacco Use   Smoking status: Former   Smokeless tobacco:  Never   Tobacco comments:    Quit in 2017   Vaping Use   Vaping Use: Never used  Substance and Sexual Activity   Alcohol use: Yes    Comment: Socially   Drug use: Never   Sexual activity: Yes  Other Topics Concern   Not on file  Social History Narrative   Lives with wife   Social Determinants of Health   Financial Resource Strain: Not on file  Food Insecurity: Not on file  Transportation Needs: Not on file  Physical Activity: Not on file  Stress: Not on file  Social Connections: Not on file  Intimate Partner Violence: Not on file    Family History  Problem Relation Age of Onset   Diabetes Mother    CAD Mother    Heart attack Mother        x 2   COPD Father    Colon cancer Neg Hx    Esophageal cancer Neg Hx    Rectal cancer Neg Hx    Stomach cancer Neg Hx     ROS: no fevers or chills, productive cough, hemoptysis, dysphasia, odynophagia, melena, hematochezia, dysuria, hematuria, rash, seizure activity, orthopnea, PND, pedal edema, claudication. Remaining systems are negative.  Physical Exam: Well-developed well-nourished in no acute distress.  Skin is warm and dry.  HEENT is normal.  Neck is supple.  Chest is clear to auscultation with normal expansion.  Cardiovascular exam is regular rate and rhythm.  Abdominal exam nontender or distended. No masses palpated. Extremities show no edema. neuro grossly intact   A/P  1 ischemic cardiomyopathy-continue carvedilol and losartan.  I will increase carvedilol to 12.5 mg twice daily.  He will follow his blood pressure at home and we will advance losartan if able.  Once medications fully titrated we will need to repeat echocardiogram.  If ejection fraction less than 35% will need to consider ICD.  2 coronary artery disease-continue aspirin and statin.  We will review previous catheterization films with Dr. Martinique.  The LAD was felt to be small and medical therapy may be best option.  3 prior CVA-previous echocardiogram  demonstrated swirling at the apex.  We will continue apixaban.  4 hypertension-blood pressure controlled.  Continue present medications.  Adjustments as outlined above under ischemic cardiomyopathy.  5 hyperlipidemia-continue Crestor and Zetia.  Most recent LDL not at goal.  Will refer to lipid clinic for consideration of Repatha.  Kirk Ruths, MD  I have reviewed the patient's cardiac catheterization films with Dr. Martinique.  We feel that his LAD is small and that his anterior wall is likely nonviable.  He does have disease in his PDA and  OM but he was not having chest pain at time of evaluation.  We therefore plan medical therapy.  If he develops symptoms can consider PCI of the OM and possibly PDA. Kirk Ruths

## 2020-09-20 ENCOUNTER — Encounter: Payer: Self-pay | Admitting: Cardiology

## 2020-09-20 ENCOUNTER — Ambulatory Visit (INDEPENDENT_AMBULATORY_CARE_PROVIDER_SITE_OTHER): Payer: Managed Care, Other (non HMO) | Admitting: Cardiology

## 2020-09-20 ENCOUNTER — Other Ambulatory Visit: Payer: Self-pay

## 2020-09-20 VITALS — BP 133/78 | HR 81 | Ht 70.0 in | Wt 167.0 lb

## 2020-09-20 DIAGNOSIS — I255 Ischemic cardiomyopathy: Secondary | ICD-10-CM

## 2020-09-20 DIAGNOSIS — I10 Essential (primary) hypertension: Secondary | ICD-10-CM | POA: Diagnosis not present

## 2020-09-20 DIAGNOSIS — E785 Hyperlipidemia, unspecified: Secondary | ICD-10-CM

## 2020-09-20 DIAGNOSIS — I2510 Atherosclerotic heart disease of native coronary artery without angina pectoris: Secondary | ICD-10-CM

## 2020-09-20 MED ORDER — CARVEDILOL 12.5 MG PO TABS: 12.5000 mg | ORAL_TABLET | Freq: Two times a day (BID) | ORAL | 3 refills | Status: DC

## 2020-09-20 NOTE — Patient Instructions (Signed)
Medication Instructions:   INCREASE CARVEDILOL TO 12.5 MG TWICE DAILY=2 OF THE 6.25 MG TABLETS TWICE DAILY  *If you need a refill on your cardiac medications before your next appointment, please call your pharmacy*   Follow-Up: At [facility], you and your health needs are our priority.  As part of our continuing mission to provide you with exceptional heart care, we have created designated Provider Care Teams.  These Care Teams include your primary Cardiologist (physician) and Advanced Practice Providers (APPs -  Physician Assistants and Nurse Practitioners) who all work together to provide you with the care you need, when you need it.  We recommend signing up for the patient portal called "MyChart".  Sign up information is provided on this After Visit Summary.  MyChart is used to connect with patients for Virtual Visits (Telemedicine).  Patients are able to view lab/test results, encounter notes, upcoming appointments, etc.  Non-urgent messages can be sent to your provider as well.   To learn more about what you can do with MyChart, go to [email or web address].    Your next appointment:   3 month(s)  The format for your next appointment:   In Person  Provider:   Kirk Ruths, MD

## 2020-09-30 ENCOUNTER — Other Ambulatory Visit: Payer: Self-pay | Admitting: Endocrinology

## 2020-09-30 ENCOUNTER — Telehealth: Payer: Self-pay | Admitting: *Deleted

## 2020-09-30 DIAGNOSIS — E1139 Type 2 diabetes mellitus with other diabetic ophthalmic complication: Secondary | ICD-10-CM

## 2020-09-30 DIAGNOSIS — Z794 Long term (current) use of insulin: Secondary | ICD-10-CM

## 2020-09-30 NOTE — Chronic Care Management (AMB) (Signed)
  Chronic Care Management   Outreach Note  09/30/2020 Name: John [name] MRN: [id number] DOB: 07/25/1960  John [name] is a 60 y.o. year old male who is a primary care patient of Libby Maw, MD. I reached out to [place] by phone today in response to a referral sent by John [name]'s primary care provider.  An unsuccessful telephone outreach was attempted today. The patient was referred to the case management team for assistance with care management and care coordination.   Follow Up Plan: If patient returns call to provider office, please advise to call Embedded Care Management Care Guide Sukaina Toothaker at [place], [place] [facility]  Direct Dial: [phone]

## 2020-10-06 ENCOUNTER — Ambulatory Visit: Payer: Managed Care, Other (non HMO)

## 2020-10-07 NOTE — Chronic Care Management (AMB) (Signed)
  Chronic Care Management   Outreach Note  10/07/2020 Name: John [name] MRN: [id number] DOB: 25-Jan-1960  John [name] is a 60 y.o. year old male who is a primary care patient of Libby Maw, MD. I reached out to [place] by phone today in response to a referral sent by John. John [name]'s primary care provider.  A second unsuccessful telephone outreach was attempted today. The patient was referred to the case management team for assistance with care management and care coordination.  and per pt wife John [name] is out of town for the next few days  Follow Up Plan: The care management team will reach out to the patient again over the next 5 days.   Julian Hy, [place] Management  Direct Dial: [phone]

## 2020-10-14 ENCOUNTER — Ambulatory Visit: Payer: Managed Care, Other (non HMO) | Admitting: Endocrinology

## 2020-10-18 ENCOUNTER — Emergency Department (HOSPITAL_COMMUNITY): Payer: Managed Care, Other (non HMO)

## 2020-10-18 ENCOUNTER — Emergency Department (HOSPITAL_COMMUNITY)
Admission: EM | Admit: 2020-10-18 | Discharge: 2020-10-18 | Disposition: A | Discharge status: Home / Self Care | Payer: Managed Care, Other (non HMO) | Attending: Emergency Medicine | Admitting: Emergency Medicine

## 2020-10-18 ENCOUNTER — Ambulatory Visit: Payer: Managed Care, Other (non HMO)

## 2020-10-18 DIAGNOSIS — Z7984 Long term (current) use of oral hypoglycemic drugs: Secondary | ICD-10-CM | POA: Diagnosis not present

## 2020-10-18 DIAGNOSIS — Z20822 Contact with and (suspected) exposure to covid-19: Secondary | ICD-10-CM | POA: Insufficient documentation

## 2020-10-18 DIAGNOSIS — E119 Type 2 diabetes mellitus without complications: Secondary | ICD-10-CM | POA: Insufficient documentation

## 2020-10-18 DIAGNOSIS — Z955 Presence of coronary angioplasty implant and graft: Secondary | ICD-10-CM | POA: Insufficient documentation

## 2020-10-18 DIAGNOSIS — R051 Acute cough: Secondary | ICD-10-CM | POA: Insufficient documentation

## 2020-10-18 DIAGNOSIS — R0602 Shortness of breath: Secondary | ICD-10-CM | POA: Insufficient documentation

## 2020-10-18 DIAGNOSIS — Z794 Long term (current) use of insulin: Secondary | ICD-10-CM | POA: Insufficient documentation

## 2020-10-18 DIAGNOSIS — I110 Hypertensive heart disease with heart failure: Secondary | ICD-10-CM | POA: Insufficient documentation

## 2020-10-18 DIAGNOSIS — Z79899 Other long term (current) drug therapy: Secondary | ICD-10-CM | POA: Diagnosis not present

## 2020-10-18 DIAGNOSIS — I5022 Chronic systolic (congestive) heart failure: Secondary | ICD-10-CM | POA: Diagnosis not present

## 2020-10-18 DIAGNOSIS — I2510 Atherosclerotic heart disease of native coronary artery without angina pectoris: Secondary | ICD-10-CM | POA: Diagnosis not present

## 2020-10-18 DIAGNOSIS — Z7901 Long term (current) use of anticoagulants: Secondary | ICD-10-CM | POA: Diagnosis not present

## 2020-10-18 DIAGNOSIS — Z87891 Personal history of nicotine dependence: Secondary | ICD-10-CM | POA: Diagnosis not present

## 2020-10-18 LAB — CBC
HCT: 52 % (ref 39.0–52.0)
Hemoglobin: 17.1 g/dL — ABNORMAL HIGH (ref 13.0–17.0)
MCH: 29.9 pg (ref 26.0–34.0)
MCHC: 32.9 g/dL (ref 30.0–36.0)
MCV: 91.1 fL (ref 80.0–100.0)
Platelets: 382 10*3/uL (ref 150–400)
RBC: 5.71 MIL/uL (ref 4.22–5.81)
RDW: 13.6 % (ref 11.5–15.5)
WBC: 12.2 10*3/uL — ABNORMAL HIGH (ref 4.0–10.5)
nRBC: 0 % (ref 0.0–0.2)

## 2020-10-18 LAB — BASIC METABOLIC PANEL
Anion gap: 9 (ref 5–15)
BUN: 26 mg/dL — ABNORMAL HIGH (ref 6–20)
CO2: 23 mmol/L (ref 22–32)
Calcium: 9.2 mg/dL (ref 8.9–10.3)
Chloride: 106 mmol/L (ref 98–111)
Creatinine, Ser: 1.38 mg/dL — ABNORMAL HIGH (ref 0.61–1.24)
GFR, Estimated: 59 mL/min — ABNORMAL LOW (ref >60)
Glucose, Bld: 147 mg/dL — ABNORMAL HIGH (ref 70–99)
Potassium: 4.6 mmol/L (ref 3.5–5.1)
Sodium: 138 mmol/L (ref 135–145)

## 2020-10-18 LAB — RESP PANEL BY RT-PCR (FLU A&B, COVID) ARPGX2
Influenza A by PCR: NEGATIVE
Influenza B by PCR: NEGATIVE
SARS Coronavirus 2 by RT PCR: NEGATIVE

## 2020-10-18 LAB — BRAIN NATRIURETIC PEPTIDE: B Natriuretic Peptide: 455.8 pg/mL — ABNORMAL HIGH (ref 0.0–100.0)

## 2020-10-18 MED ORDER — DOXYCYCLINE HYCLATE 100 MG PO CAPS: 100.0000 mg | ORAL_CAPSULE | Freq: Two times a day (BID) | ORAL | 0 refills | Status: AC

## 2020-10-18 NOTE — ED Notes (Signed)
Pt provided discharge instructions and prescription information. Pt was given the opportunity to ask questions and questions were answered. Discharge signature not obtained in the setting of the COVID-19 pandemic in order to reduce high touch surfaces.  ° °

## 2020-10-18 NOTE — ED Provider Notes (Signed)
[facility] EMERGENCY DEPARTMENT Provider Note   CSN: [id number] Arrival date & time: 10/18/20  2440     History Chief Complaint  Patient presents with   Shortness of Breath    John [name] is a 60 y.o. male.  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  Patient states today he got up and brought his daughter to work, he states he got home and he laid back down, while he was lying down he became short of breath and had an episode of coughing, stated he had a pink frothy like sputum.  He says it only happened once, he had no associated chest pain, denies become diaphoretic, lightheaded, dizziness, nausea or vomiting, he states that he currently has no complaints this time.  He does not endorse orthopnea, worsening pedal edema, worsening dyspnea on exertion, he denies  fevers, chills, nasal congestion, sore throat, cough, general body aches, stomach pains, he denies recent sick contacts, he is vaccines COVID-19.  States he has been compliant with his apixaban and has not missed any dosages.  He has no other complaints.  He was just concerned that he was having a  heart attack and wanted to be checked out.  Past Medical History:  Diagnosis Date   Arthritis    Diabetes mellitus without complication ([place])    Hyperlipidemia    Hypertension    Stroke ([place]) 05/2020    Patient Active Problem List   Diagnosis Date Noted   Coronary artery disease involving native coronary artery of native heart without angina pectoris 09/08/2020   Chronic pain of both knees 10/28/2534   Chronic systolic CHF (congestive heart failure) ([place]) 08/18/2020   Hospital discharge follow-up 06/07/2020   Ischemic stroke ([place])  05/31/2020   Excessive cerumen in both ear canals 09/10/2019   Essential hypertension 09/12/2017   Elevated LDL cholesterol level 09/12/2017   Diabetes mellitus ([place]) 09/12/2017   Healthcare maintenance 09/12/2017    Past Surgical History:  Procedure Laterality Date   LEFT HEART CATH AND CORONARY ANGIOGRAPHY N/A 08/18/2020   Procedure: LEFT HEART CATH AND CORONARY ANGIOGRAPHY;  Surgeon: Martinique, Peter M, MD;  Location: [place] [facility];  Service: Cardiovascular;  Laterality: N/A;   TONSILLECTOMY         Family History  Problem Relation Age of Onset   Diabetes Mother    CAD Mother    Heart attack Mother        x 2   COPD Father    Colon cancer Neg Hx    Esophageal cancer Neg Hx    Rectal cancer Neg Hx    Stomach cancer Neg Hx     Social History   Tobacco Use   Smoking status: Former   Smokeless tobacco: Never   Tobacco comments:    Quit in 2017   Vaping Use   Vaping Use: Never used  Substance Use Topics   Alcohol use: Yes    Comment: Socially   Drug use: Never    Home Medications Prior to Admission medications   Medication Sig Start Date End Date Taking? Authorizing Provider  doxycycline (VIBRAMYCIN) 100 MG capsule Take 1 capsule (100 mg total) by mouth 2 (two) times daily for 7 days. 10/18/20 10/25/20 Yes Marcello Fennel, PA-C  acetaminophen (TYLENOL) 500 MG tablet Take 500 mg by mouth every 6 (six) hours as needed for moderate pain or headache.    [provider]  apixaban (ELIQUIS) 5 MG TABS tablet Take 1 tablet (5 mg total) by mouth 2 (two) times daily. 06/02/20   Bailey-Modzik, Delila A, NP  Aspirin-Caffeine (BC FAST PAIN RELIEF PO) Take 1 packet by mouth daily as needed (pain).    [provider]  carbamide peroxide (DEBROX) 6.5 % OTIC solution Place 5 drops into both ears 2 (two) times daily. 09/08/20   Libby Maw, MD  carvedilol (COREG) 12.5 MG tablet Take 1 tablet (12.5 mg total) by mouth 2 (two) times daily with a meal.  09/20/20   Crenshaw, Denice Bors, MD  Continuous Blood Gluc Sensor (FREESTYLE LIBRE 14 DAY SENSOR) MISC 1 Device by Does not apply route every 14 (fourteen) days. 03/11/20   Renato Shin, MD  diclofenac Sodium (VOLTAREN) 1 % GEL Rub a small grape sized dollop into knee up to 3 times daily as needed. 09/08/20   Libby Maw, MD  Dulaglutide (TRULICITY) 1.5 YB/0.1BP SOPN Inject 1.5 mg into the skin once a week. Patient taking differently: Inject 1.5 mg into the skin once a week. Sundays 03/11/20   Renato Shin, MD  ezetimibe (ZETIA) 10 MG tablet Take 1 tablet (10 mg total) by mouth daily. 09/08/20   Libby Maw, MD  FARXIGA 10 MG TABS tablet TAKE ONE TABLET BY MOUTH DAILY 10/02/20   Renato Shin, MD  glucose blood test strip 1 each by Other route 2 (two) times daily. And lancets 2/day 03/10/18   Renato Shin, MD  insulin degludec (TRESIBA FLEXTOUCH) 100 UNIT/ML FlexTouch Pen Inject 40 Units into the skin daily. 03/11/20   Renato Shin, MD  Insulin Pen Needle (PEN NEEDLES) 32G X 4 MM MISC 1 Package by Does not apply route 4 (four) times daily. 12/11/17   Renato Shin, MD  Lidocaine 4 % PTCH Apply 1 patch topically daily as needed (pain).    [provider]  losartan (COZAAR) 25 MG tablet Take 25 mg by mouth daily. 07/07/20   [provider]  metFORMIN (GLUCOPHAGE-XR) 500 MG 24 hr tablet TAKE ONE TABLET BY MOUTH TWICE A DAY 07/19/20   Renato Shin, MD  rosuvastatin (CRESTOR) 40 MG tablet Take 1 tablet (40 mg total) by mouth daily. 06/03/20   Bailey-Modzik, Delila A, NP  sildenafil (VIAGRA) 100 MG tablet TAKE ONE TABLET BY MOUTH DAILY AS NEEDED FOR ERECTILE DYSFUNCTION Patient taking differently: Take 100 mg by mouth as needed for erectile dysfunction. 11/10/19   Renato Shin, MD    Allergies    Patient has no known allergies.  Review of Systems   Review of Systems  Constitutional:  Negative for chills and fever.  HENT:  Negative for congestion.   Respiratory:  Positive  for cough and shortness of breath.   Cardiovascular:  Negative for chest pain, palpitations and leg swelling.  Gastrointestinal:  Negative for abdominal pain, diarrhea, nausea and vomiting.  Genitourinary:  Negative for enuresis.  Musculoskeletal:  Negative for myalgias.  Skin:  Negative for rash.  Neurological:  Negative for headaches.  Hematological:  Does not bruise/bleed easily.   Physical Exam Updated Vital Signs BP (!) 155/97 (BP Location: Right Arm)   Pulse 81   Temp 97.9 F (36.6 C)   Resp 20   SpO2 100%   Physical Exam Vitals and nursing note reviewed.  Constitutional:  General: He is not in acute distress.    Appearance: He is not ill-appearing.  HENT:     Head: Normocephalic and atraumatic.     Nose: No congestion.     Mouth/Throat:     Mouth: Mucous membranes are moist.  Eyes:     Conjunctiva/sclera: Conjunctivae normal.  Cardiovascular:     Rate and Rhythm: Normal rate and regular rhythm.     Pulses: Normal pulses.     Heart sounds: No murmur heard.   No friction rub. No gallop.  Pulmonary:     Effort: No respiratory distress.     Breath sounds: Rales present. No wheezing or rhonchi.     Comments: Patient did not appear to be in respiratory distress, he is nontachypneic, nonhypoxic, he is resting comfortably, able to speak in full sentences.  He had some bibasilar Rales heard worse in the left versus the right, no wheezing, rhonchi or stridor present. Abdominal:     Palpations: Abdomen is soft.     Tenderness: There is no abdominal tenderness. There is no right CVA tenderness or left CVA tenderness.  Musculoskeletal:     Right lower leg: No edema.     Left lower leg: No edema.  Skin:    General: Skin is warm and dry.  Neurological:     Mental Status: He is alert.  Psychiatric:        Mood and Affect: Mood normal.    ED Results / Procedures / Treatments   Labs (all labs ordered are listed, but only abnormal results are displayed) Labs Reviewed   BASIC METABOLIC PANEL - Abnormal; Notable for the following components:      Result Value   Glucose, Bld 147 (*)    BUN 26 (*)    Creatinine, Ser 1.38 (*)    GFR, Estimated 59 (*)    All other components within normal limits  CBC - Abnormal; Notable for the following components:   WBC 12.2 (*)    Hemoglobin 17.1 (*)    All other components within normal limits  BRAIN NATRIURETIC PEPTIDE - Abnormal; Notable for the following components:   B Natriuretic Peptide 455.8 (*)    All other components within normal limits  RESP PANEL BY RT-PCR (FLU A&B, COVID) [id number]    EKG EKG Interpretation  Date/Time:  Tuesday October 18 2020 08:30:35 EDT Ventricular Rate:  111 PR Interval:  156 QRS Duration: 90 QT Interval:  332 QTC Calculation: 451 R Axis:   -28 Text Interpretation: Sinus tachycardia Moderate voltage criteria for LVH, may be normal variant ( R in aVL , Sokolow-Lyon ) Septal infarct , age undetermined Abnormal ECG Since last tracing rate faster Confirmed by Isla Pence [phone]) on 10/18/2020 4:49:45 PM  Radiology DG Chest 2 View  Result Date: 10/18/2020 CLINICAL DATA:  60 year old male with shortness of breath and cough onset this morning. Former smoker. EXAM: CHEST - 2 VIEW COMPARISON:  CTA neck 05/31/2020. FINDINGS: Lung volumes and mediastinal contours are within normal limits. There is widespread coarse bilateral pulmonary interstitial opacity, with asymmetric reticulonodular density in both the right upper and lower lung. Superimposed trace pleural fluid or pleural thickening including at the right minor fissure. No pneumothorax, consolidation or costophrenic angle pleural fluid. Some pulmonary interstitial changes suspected on the May CTA. Visualized tracheal air column is within normal limits. No acute osseous abnormality identified. Negative visible bowel gas pattern. IMPRESSION: Asymmetric coarse bilateral pulmonary interstitial opacity greater on the right. Associated trace  pleural fluid  or pleural thickening. Appearance is suspicious for acute viral/atypical respiratory infection superimposed on chronic interstitial lung disease. Interstitial edema is felt less likely, heart size is normal. Electronically Signed   By: Genevie Ann M.D.   On: 10/18/2020 09:03    Procedures Procedures   Medications Ordered in ED Medications - No data to display  ED Course  I have reviewed the triage vital signs and the nursing notes.  Pertinent labs & imaging results that were available during my care of the patient were reviewed by me and considered in my medical decision making (see chart for details).    MDM Rules/Calculators/A&P                          Initial impression-patient presents with pink frothy sputum.  He is alert, does not appear in distress, vital signs are reassuring.  Triage obtain basic lab work-up.  We will add on BNP, COVID, ambulate and reassess.  Work-up-CBC shows leukocytosis of 12.2, BMP shows hyperglycemia 147, BUN of 26, creatinine 1.38, chest x-ray shows asymmetric coarse bilateral pulmonary interstitial opacities worse on the right, trace pleural effusion or pleural thickening present, suspicious for viral/atypical respiratory infection superimposed on chronic interstitial lung disease versus possible edema.  EKG sinus tach without signs of ischemia.  Patient is ambulated remained 100% on room air.  BNP mildly elevated 455,   Reassessment-patient  reassessed updated on lab work and imaging, he is agreed for discharge at this time.  Rule out- I have low suspicion for ACS as history is atypical, EKG was sinus rhythm without signs of ischemia, troponins will be deferred as patient's had no chest pain for greater than 12 hours.  Low suspicion for PE as patient denies pleuritic chest pain, shortness of breath, patient denies leg pain, no pedal edema noted on exam, vital signs reassuring, nontachypneic, nonhypoxic, currently on anticoagulant.  Low suspicion for  AAA or aortic dissection as history is atypical, patient has low risk factors.  Low suspicion for CHF exacerbation as vital signs are reassuring, BNP is only mildly elevated, suspect this could be his baseline as he does have an EF of 30, he was nontachypneic, no peripheral edema present, there is  no significant amount of pleural effusion seen on x-ray will defer on diuretics at this time.  Low suspicion for systemic infection as patient is nontoxic-appearing, vital signs reassuring, no obvious source infection noted on exam.   Plan-  Cough-likely patient had some blood stained sputum from atypical pneumonia, will start him on doxycycline.  Have him follow-up on his COVID test, if positive recommend that he speaks with his primary care provider to see if he should be placed on the antiviral treatment.  Given strict return precautions.  Vital signs have remained stable, no indication for hospital admission.  Patient discussed with attending and they agreed with assessment and plan.  Patient given at home care as well strict return precautions.  Patient verbalized that they understood agreed to said plan.  Final Clinical Impression(s) / ED Diagnoses Final diagnoses:  Acute cough    Rx / DC Orders ED Discharge Orders          Ordered    doxycycline (VIBRAMYCIN) 100 MG capsule  2 times daily        10/18/20 1831             Aron Baba 10/18/20 1833    Isla Pence, MD 10/18/20 2007

## 2020-10-18 NOTE — ED Triage Notes (Signed)
Pt. Stated, I started having SOB this morning and coughing up pink stuff.

## 2020-10-18 NOTE — Discharge Instructions (Addendum)
Likely you have the beginning of a atypical pneumonia, started you on antibiotics please take as prescribed.  Your COVID test is pending at this time, please check your MyChart account, if you are COVID-positive like you to contact your PCP to see if they would like to place you on the antiviral treatments.  Please follow CDC guidelines for quarantine if you are COVID-positive.  Come back to the emergency department if you develop chest pain, shortness of breath, severe abdominal pain, uncontrolled nausea, vomiting, diarrhea.

## 2020-10-18 NOTE — ED Notes (Signed)
Pt ambulated with SpO2 monitor. Maintained 100% on r/a with HR to 105.

## 2020-10-20 NOTE — Chronic Care Management (AMB) (Signed)
  Chronic Care Management   Outreach Note  10/20/2020 Name: John [name] MRN: [id number] DOB: 1960-10-13  John [name] is a 61 y.o. year old male who is a primary care patient of Libby Maw, MD. I reached out to [place] by phone today in response to a referral sent by John [name]'s primary care provider.  Third unsuccessful telephone outreach was attempted today. The patient was referred to the case management team for assistance with care management and care coordination. The patient's primary care provider has been notified of our unsuccessful attempts to make or maintain contact with the patient. The care management team is pleased to engage with this patient at any time in the future should he/she be interested in assistance from the care management team.   Follow Up Plan: We have been unable to make contact with the patient for follow up. The care management team is available to follow up with the patient after provider conversation with the patient regarding recommendation for care management engagement and subsequent re-referral to the care management team.     Julian Hy, [employer] Management  Direct Dial: [phone]

## 2020-11-04 ENCOUNTER — Other Ambulatory Visit: Payer: Self-pay

## 2020-11-04 ENCOUNTER — Telehealth: Payer: Self-pay | Admitting: Pharmacist

## 2020-11-04 ENCOUNTER — Ambulatory Visit (INDEPENDENT_AMBULATORY_CARE_PROVIDER_SITE_OTHER): Payer: Managed Care, Other (non HMO) | Admitting: Pharmacist

## 2020-11-04 DIAGNOSIS — I2510 Atherosclerotic heart disease of native coronary artery without angina pectoris: Secondary | ICD-10-CM

## 2020-11-04 DIAGNOSIS — E7800 Pure hypercholesterolemia, unspecified: Secondary | ICD-10-CM

## 2020-11-04 NOTE — Progress Notes (Signed)
Patient ID: [name]                 DOB: August 24, 1960                    MRN: [id number]     HPI: John [name] is a 60 y.o. male patient referred to lipid clinic by Dr  Stanford Breed. PMH is significant for HLD, DM (last A1c 6.3), HTN, CAD, and cardiomyopathy.  Recent cardiac cath in August 2022 showed 99% proximal LAD, 80% first marginal, 80% PDA 1 and 90% PDA 2.  Patient presents today in good spirits. Motivated to lower LDL. Currently on rosuvastatin 40mg  and ezetimibe 10mg  and tolerates both.  Has a strong family history of CAD.  Mother, and maternal grandparents deceased from MI.    Patient at high risk for other CV events but is being medically managed with high intensity statin, GLP1, and SGLT2.  Current Medications:  rosuvastatin 40mg  Zetia 10mg   Intolerances: N/A Risk Factors: CAD, HTN, HLD, T2DM LDL goal: <55  Labs: Trigs 128, LDL 97, TC 169, HDL 46.8 (09/08/20 on Crestor 40 and Zetia 10)  Past Medical History:  Diagnosis Date   Arthritis    Diabetes mellitus without complication ([place])    Hyperlipidemia    Hypertension    Stroke ([name]) 05/2020    Current Outpatient Medications on File Prior to Visit  Medication Sig Dispense Refill   acetaminophen (TYLENOL) 500 MG tablet Take 500 mg by mouth every 6 (six) hours as needed for moderate pain or headache.     apixaban (ELIQUIS) 5 MG TABS tablet Take 1 tablet (5 mg total) by mouth 2 (two) times daily. 60 tablet 2   Aspirin-Caffeine (BC FAST PAIN RELIEF PO) Take 1 packet by mouth daily as needed (pain).     carbamide peroxide (DEBROX) 6.5 % OTIC solution Place 5 drops into both ears 2 (two) times daily. 15 mL 2   carvedilol (COREG) 12.5 MG tablet Take 1 tablet (12.5 mg total) by mouth 2 (two) times daily with a meal. 180 tablet 3   Continuous Blood Gluc Sensor (FREESTYLE LIBRE 14 DAY SENSOR) MISC 1 Device by Does not apply route every 14 (fourteen) days. 6 each 3   diclofenac Sodium (VOLTAREN) 1 % GEL Rub a small  grape sized dollop into knee up to 3 times daily as needed. 350 g 1   Dulaglutide (TRULICITY) 1.5 WG/9.5AO SOPN Inject 1.5 mg into the skin once a week. (Patient taking differently: Inject 1.5 mg into the skin once a week. Sundays) 6 mL 3   ezetimibe (ZETIA) 10 MG tablet Take 1 tablet (10 mg total) by mouth daily. 90 tablet 3   FARXIGA 10 MG TABS tablet TAKE ONE TABLET BY MOUTH DAILY 30 tablet 3   glucose blood test strip 1 each by Other route 2 (two) times daily. And lancets 2/day 200 each 3   insulin degludec (TRESIBA FLEXTOUCH) 100 UNIT/ML FlexTouch Pen Inject 40 Units into the skin daily. 15 mL 3   Insulin Pen Needle (PEN NEEDLES) 32G X 4 MM MISC 1 Package by Does not apply route 4 (four) times daily. 360 each 2   Lidocaine 4 % PTCH Apply 1 patch topically daily as needed (pain).     losartan (COZAAR) 25 MG tablet Take 25 mg by mouth daily.     metFORMIN (GLUCOPHAGE-XR) 500 MG 24 hr tablet TAKE ONE TABLET BY MOUTH TWICE A DAY 180 tablet 3  rosuvastatin (CRESTOR) 40 MG tablet Take 1 tablet (40 mg total) by mouth daily. 30 tablet 1   sildenafil (VIAGRA) 100 MG tablet TAKE ONE TABLET BY MOUTH DAILY AS NEEDED FOR ERECTILE DYSFUNCTION (Patient taking differently: Take 100 mg by mouth as needed for erectile dysfunction.) 30 tablet 2   Current Facility-Administered Medications on File Prior to Visit  Medication Dose Route Frequency Provider Last Rate Last Admin   sodium chloride flush (NS) 0.9 % injection 3 mL  3 mL Intravenous Q12H Meng, [place], PA        No Known Allergies  Assessment/Plan:  1. Hyperlipidemia - Patient LDL 97 which is above goal of <55 despite being on high intensity statin and ezetimibe.  Aggressive goal due to T2DM and CAD.  Patient a strong candidate for PCSK9i.  Using [employer], educated patient on mechanism of action, storage, site selection, administration, and possible adverse effects.  Patient declined to try on his own, reported he felt comfortable due to daily  insulin injections.  Will complete PA and contact patient when approved.  Can stop Zetia at this time.  Karren Cobble, PharmD, BCACP, [place], [place], [place], [place], [address] Phone: [phone], Fax: [phone]

## 2020-11-04 NOTE — Patient Instructions (Addendum)
It was nice meeting you today  We would like your LDL (bad cholesterol) to be less than 55  Continue your rosuvastatin 40mg  once a day  We will start you on a new medication you will inject once every 2 weeks  Once you start the new medication, you can discontinue your ezetimibe  I will complete the prior authorization for you and call you when it is approved  Once you start the medication we will recheck your cholesterol in 2-3 months  Please call with any questions!  Karren Cobble, PharmD, BCACP, [place], [place], [place], [place], [address] Phone: [phone], Fax: [phone]

## 2020-11-04 NOTE — Telephone Encounter (Signed)
Please complete prior authorization for:  Name of medication, dose, and frequency Repatha 140mg  sq q 14 days or Praluent sq q 14 days  Lab Orders Requested? yes  Which labs? Lipid panel  Estimated date for labs to be scheduled 2-3 months  Does patient need activated copay card? Not sure

## 2020-11-07 NOTE — Telephone Encounter (Signed)
PA SUBMITTED FOR REPATHA VIA CMM Frenchie Earnhardt (Key: [id number])  LIPID PANEL ORDERED AND RELEASED  COPAY CARD ACTIVATED FOR REPATHA: RxBin: [id number] [place]: CN RxGrp: [id number] ID: [id number]

## 2020-11-08 MED ORDER — REPATHA SURECLICK 140 MG/ML ~~LOC~~ SOAJ: 140.0000 mg | SUBCUTANEOUS | 11 refills | Status: DC

## 2020-11-08 NOTE — Addendum Note (Signed)
Addended by: Allean Found on: 11/08/2020 09:49 AM   Modules accepted: Orders

## 2020-11-08 NOTE — Telephone Encounter (Signed)
Called and spoke to pharmacy and gave copay card to them.

## 2020-11-14 NOTE — Telephone Encounter (Signed)
Called and spoke to pt to let them know that the medication was approved an they stated tha they already knew it and picked up already

## 2020-12-07 ENCOUNTER — Other Ambulatory Visit: Payer: Self-pay | Admitting: Endocrinology

## 2020-12-09 ENCOUNTER — Telehealth: Payer: Self-pay | Admitting: Endocrinology

## 2020-12-09 DIAGNOSIS — E1139 Type 2 diabetes mellitus with other diabetic ophthalmic complication: Secondary | ICD-10-CM

## 2020-12-09 MED ORDER — TRESIBA FLEXTOUCH 100 UNIT/ML ~~LOC~~ SOPN: 40.0000 [IU] | PEN_INJECTOR | Freq: Every day | SUBCUTANEOUS | 0 refills | Status: DC

## 2020-12-09 MED ORDER — TRULICITY 1.5 MG/0.5ML ~~LOC~~ SOAJ: 1.5000 mg | SUBCUTANEOUS | 0 refills | Status: DC

## 2020-12-09 NOTE — Telephone Encounter (Signed)
PT needs refill on the below: insulin degludec (TRESIBA FLEXTOUCH) 100 UNIT/ML FlexTouch Pen  Dulaglutide (TRULICITY) 1.5 PV/6.6KD SOPN  Please forward to:  [facility] [id number] - [place], [place] - [address]   Pt will be out of both medication tomorrow. Phone:  [phone]  Fax:  [phone]    Appt set for 01/05/21 at 10:45 am

## 2020-12-09 NOTE — Telephone Encounter (Signed)
Rx sent for 30 day supply

## 2020-12-12 ENCOUNTER — Other Ambulatory Visit (HOSPITAL_COMMUNITY): Payer: Self-pay

## 2020-12-12 NOTE — Progress Notes (Signed)
[id number] cardiomyopathy-patient admitted May 2022 with acute CVA.  Echocardiogram June 2022 showed echogenic region in the LV lateral apex which could possibly be mural thrombus versus prominent trabeculations in the setting of wall motion abnormalities.  Ejection fraction 40 to 45%.  There was grade 1 diastolic dysfunction.  Given recent CVA and swirling at the LV apex apixaban was added.  Follow-up echocardiogram June 2022 showed ejection fraction 30 to 35% with akinesis of the inferoseptal and anteroseptal wall.  Cardiac catheterization August 2022 showed 99% proximal LAD, 80% first marginal, 80% PDA 1 and 90% PDA 2.  The LAD was felt to be diffusely small.  It was felt the OM and PDA could potentially be causing ischemia but patient asymptomatic.  I did review the films with Dr. Martinique and we felt that his LAD was small and anterior wall likely nonviable.  We felt medical therapy was best option.  If he develops symptoms in the future could consider PCI of obtuse marginal and possibly PDA.  Since last seen he has dyspnea with more vigorous activities but not routine activities.  No orthopnea, PND, pedal edema, exertional chest pain or syncope.  Current Outpatient Medications  Medication Sig Dispense Refill   acetaminophen (TYLENOL) 500 MG tablet Take 500 mg by mouth every 6 (six) hours as needed for moderate pain or headache.     apixaban (ELIQUIS) 5 MG TABS tablet Take 1 tablet (5 mg total) by mouth 2 (two) times daily. 60 tablet 2   Aspirin-Caffeine (BC FAST PAIN RELIEF PO) Take 1 packet by mouth daily as needed (pain).     carbamide peroxide (DEBROX) 6.5 % OTIC solution Place 5 drops into both ears 2 (two) times daily. 15 mL 2   carvedilol (COREG) 12.5 MG tablet Take 1 tablet (12.5 mg total) by mouth 2 (two) times daily with a meal. 180 tablet 3   Continuous Blood Gluc Sensor (FREESTYLE LIBRE 14 DAY SENSOR) MISC 1 Device by Does not apply route every 14 (fourteen) days. 6 each 3    diclofenac Sodium (VOLTAREN) 1 % GEL Rub a small grape sized dollop into knee up to 3 times daily as needed. 350 g 1   Dulaglutide (TRULICITY) 1.5 NG/2.9BM SOPN Inject 1.5 mg into the skin once a week. 2 mL 0   Evolocumab (REPATHA SURECLICK) 841 MG/ML SOAJ Inject 140 mg into the skin every 14 (fourteen) days. 2 mL 11   ezetimibe (ZETIA) 10 MG tablet Take 1 tablet (10 mg total) by mouth daily. 90 tablet 3   FARXIGA 10 MG TABS tablet TAKE ONE TABLET BY MOUTH DAILY 30 tablet 3   glucose blood test strip 1 each by Other route 2 (two) times daily. And lancets 2/day 200 each 3   insulin degludec (TRESIBA FLEXTOUCH) 100 UNIT/ML FlexTouch Pen Inject 40 Units into the skin daily. 15 mL 0   Insulin Pen Needle (PEN NEEDLES) 32G X 4 MM MISC 1 Package by Does not apply route 4 (four) times daily. 360 each 2   Lidocaine 4 % PTCH Apply 1 patch topically daily as needed (pain).     losartan (COZAAR) 25 MG tablet Take 25 mg by mouth daily.     metFORMIN (GLUCOPHAGE-XR) 500 MG 24 hr tablet TAKE ONE TABLET BY MOUTH TWICE A DAY 180 tablet 3   rosuvastatin (CRESTOR) 40 MG tablet Take 1 tablet (40 mg total) by mouth daily. 30 tablet 1   sildenafil (VIAGRA) 100 MG tablet TAKE ONE TABLET  BY MOUTH DAILY AS NEEDED FOR ERECTILE DYSFUNCTION (Patient taking differently: Take 100 mg by mouth as needed for erectile dysfunction.) 30 tablet 2   Current Facility-Administered Medications  Medication Dose Route Frequency Provider Last Rate Last Admin   sodium chloride flush (NS) 0.9 % injection 3 mL  3 mL Intravenous Q12H Almyra Deforest, [place]         Past Medical History:  Diagnosis Date   Arthritis    Diabetes mellitus without complication ([place])    Hyperlipidemia    Hypertension    Stroke ([place]) 05/2020    Past Surgical History:  Procedure Laterality Date   LEFT HEART CATH AND CORONARY ANGIOGRAPHY N/A 08/18/2020   Procedure: LEFT HEART CATH AND CORONARY ANGIOGRAPHY;  Surgeon: Martinique, Peter M, MD;  Location: [facility];   Service: Cardiovascular;  Laterality: N/A;   TONSILLECTOMY      Social History   Socioeconomic History   Marital status: Married    Spouse name: [name]   Number of children: 4   Years of education: Not on file   Highest education level: Not on file  Occupational History   Not on file  Tobacco Use   Smoking status: Former   Smokeless tobacco: Never   Tobacco comments:    Quit in 2017   Vaping Use   Vaping Use: Never used  Substance and Sexual Activity   Alcohol use: Yes    Comment: Socially   Drug use: Never   Sexual activity: Yes  Other Topics Concern   Not on file  Social History Narrative   Lives with wife   Social Determinants of Health   Financial Resource Strain: Not on file  Food Insecurity: Not on file  Transportation Needs: Not on file  Physical Activity: Not on file  Stress: Not on file  Social Connections: Not on file  Intimate Partner Violence: Not on file    Family History  Problem Relation Age of Onset   Diabetes Mother    CAD Mother    Heart attack Mother        x 2   COPD Father    Colon cancer Neg Hx    Esophageal cancer Neg Hx    Rectal cancer Neg Hx    Stomach cancer Neg Hx     ROS: no fevers or chills, productive cough, hemoptysis, dysphasia, odynophagia, melena, hematochezia, dysuria, hematuria, rash, seizure activity, orthopnea, PND, pedal edema, claudication. Remaining systems are negative.  Physical Exam: Well-developed well-nourished in no acute distress.  Skin is warm and dry.  HEENT is normal.  Neck is supple.  Chest is clear to auscultation with normal expansion.  Cardiovascular exam is regular rate and rhythm.  Abdominal exam nontender or distended. No masses palpated. Extremities show no edema. neuro grossly intact   A/P  1 coronary artery disease-patient remains asymptomatic with no chest pain.  As outlined above we will continue medical therapy with aspirin and statin.  PCI of the obtuse marginal and possibly PDA  could be considered if he develops worsening symptoms.  2 ischemic cardiomyopathy-continue carvedilol.  Change losartan to Entresto 25/26 twice daily.  In 3 months we will plan to repeat echocardiogram.  If ejection fraction less than 35% would need to consider ICD.  3 hypertension-blood pressure controlled.  Medication adjustments as outlined above.  4 hyperlipidemia-continue present medications.  5 prior CVA-previous echocardiogram revealed swirling at the apex concerning for possible increased risk for thrombus formation.  We will continue apixaban.  Aaron Edelman  Stanford Breed, MD

## 2020-12-13 ENCOUNTER — Other Ambulatory Visit (HOSPITAL_COMMUNITY): Payer: Self-pay

## 2020-12-13 ENCOUNTER — Other Ambulatory Visit: Payer: Self-pay

## 2020-12-13 ENCOUNTER — Telehealth: Payer: Self-pay | Admitting: Pharmacy Technician

## 2020-12-13 ENCOUNTER — Telehealth: Payer: Self-pay | Admitting: Cardiology

## 2020-12-13 MED ORDER — APIXABAN 5 MG PO TABS: 5.0000 mg | ORAL_TABLET | Freq: Two times a day (BID) | ORAL | 2 refills | Status: DC

## 2020-12-13 NOTE — Telephone Encounter (Signed)
Prescription refill request for Eliquis received. Indication:Stroke Last office visit:9/22 Scr:1.3 Age: 60 Weight:75.8 kg  Prescription refilled

## 2020-12-13 NOTE — Telephone Encounter (Signed)
Patient Advocate Encounter   Received notification from [employer] that prior authorization for Trulicity 1.5mg  is required by his/her insurance [employer].  Per Test Claim: Pt had med filled 12/08/20. No PA needed at this time.

## 2020-12-13 NOTE — Telephone Encounter (Signed)
°*  STAT* If patient is at the pharmacy, call can be transferred to refill team.   1. Which medications need to be refilled? (please list name of each medication and dose if known) apixaban (ELIQUIS) 5 MG TABS tablet  2. Which pharmacy/location (including street and city if local pharmacy) is medication to be sent to? [facility] [id number] - [place], [place]  3. Do they need a 30 day or 90 day supply?  90 day supply  Pt is out of this medicine

## 2020-12-16 ENCOUNTER — Encounter: Payer: Self-pay | Admitting: Cardiology

## 2020-12-16 ENCOUNTER — Other Ambulatory Visit: Payer: Self-pay

## 2020-12-16 ENCOUNTER — Ambulatory Visit (INDEPENDENT_AMBULATORY_CARE_PROVIDER_SITE_OTHER): Payer: Managed Care, Other (non HMO) | Admitting: Cardiology

## 2020-12-16 VITALS — BP 122/76 | HR 86 | Ht 70.0 in | Wt 168.8 lb

## 2020-12-16 DIAGNOSIS — I10 Essential (primary) hypertension: Secondary | ICD-10-CM

## 2020-12-16 DIAGNOSIS — I255 Ischemic cardiomyopathy: Secondary | ICD-10-CM

## 2020-12-16 DIAGNOSIS — I2510 Atherosclerotic heart disease of native coronary artery without angina pectoris: Secondary | ICD-10-CM | POA: Diagnosis not present

## 2020-12-16 DIAGNOSIS — Z8673 Personal history of transient ischemic attack (TIA), and cerebral infarction without residual deficits: Secondary | ICD-10-CM

## 2020-12-16 DIAGNOSIS — E785 Hyperlipidemia, unspecified: Secondary | ICD-10-CM | POA: Diagnosis not present

## 2020-12-16 MED ORDER — ENTRESTO 24-26 MG PO TABS: 1.0000 | ORAL_TABLET | Freq: Two times a day (BID) | ORAL | 6 refills | Status: DC

## 2020-12-16 NOTE — Patient Instructions (Signed)
Medication Instructions:   STOP LOSARTAN  START ENTRESTO 24/26 MG ONE TABLET TWICE DAILY  *If you need a refill on your cardiac medications before your next appointment, please call your pharmacy*   Lab Work:  Your physician recommends that you return for lab work in: ONE WEEK-DO NOT HAVE TO FAST  If you have labs (blood work) drawn today and your tests are completely normal, you will receive your results only by: [place] (if you have MyChart) OR A paper copy in the mail If you have any lab test that is abnormal or we need to change your treatment, we will call you to review the results.   Testing/Procedures:  Your physician has requested that you have an echocardiogram. Echocardiography is a painless test that uses sound waves to create images of your heart. It provides your doctor with information about the size and shape of your heart and how well your hearts chambers and valves are working. This procedure takes approximately one hour. There are no restrictions for this procedure. [place] 3 MONTHS   Follow-Up: At [facility], you and your health needs are our priority.  As part of our continuing mission to provide you with exceptional heart care, we have created designated Provider Care Teams.  These Care Teams include your primary Cardiologist (physician) and Advanced Practice Providers (APPs -  Physician Assistants and Nurse Practitioners) who all work together to provide you with the care you need, when you need it.  We recommend signing up for the patient portal called "MyChart".  Sign up information is provided on this After Visit Summary.  MyChart is used to connect with patients for Virtual Visits (Telemedicine).  Patients are able to view lab/test results, encounter notes, upcoming appointments, etc.  Non-urgent messages can be sent to your provider as well.   To learn more about what you can do with MyChart, go to  [email or web address].    Your next appointment:   4 month(s)  The format for your next appointment:   In Person  Provider:   Kirk Ruths, MD

## 2021-01-05 ENCOUNTER — Ambulatory Visit: Payer: Managed Care, Other (non HMO) | Admitting: Endocrinology

## 2021-01-05 ENCOUNTER — Encounter: Payer: Self-pay | Admitting: Endocrinology

## 2021-01-05 ENCOUNTER — Other Ambulatory Visit: Payer: Self-pay

## 2021-01-05 VITALS — BP 90/50 | HR 90 | Ht 70.0 in | Wt 170.0 lb

## 2021-01-05 DIAGNOSIS — Z794 Long term (current) use of insulin: Secondary | ICD-10-CM

## 2021-01-05 DIAGNOSIS — E1139 Type 2 diabetes mellitus with other diabetic ophthalmic complication: Secondary | ICD-10-CM

## 2021-01-05 LAB — POCT GLYCOSYLATED HEMOGLOBIN (HGB A1C): Hemoglobin A1C: 6.9 % — AB (ref 4.0–5.6)

## 2021-01-05 MED ORDER — TRULICITY 1.5 MG/0.5ML ~~LOC~~ SOAJ: 1.5000 mg | SUBCUTANEOUS | 3 refills | Status: DC

## 2021-01-05 MED ORDER — FARXIGA 10 MG PO TABS: 10.0000 mg | ORAL_TABLET | Freq: Every day | ORAL | 3 refills | Status: DC

## 2021-01-05 MED ORDER — TRESIBA FLEXTOUCH 100 UNIT/ML ~~LOC~~ SOPN: 40.0000 [IU] | PEN_INJECTOR | Freq: Every day | SUBCUTANEOUS | 3 refills | Status: DC

## 2021-01-05 NOTE — Progress Notes (Signed)
Subjective:    Patient ID: John [name], male    DOB: 24-Nov-1960, 61 y.o.   MRN: [id number]  HPI Pt returns for f/u of diabetes mellitus: DM type: Insulin-requiring type 2 Dx'ed: 3545 Complications: DR, CAD, and CVA Therapy: insulin since 6256, Trulicity, and 2 oral meds.   DKA: never Severe hypoglycemia: never Pancreatitis: never Pancreatic imaging: never.   SDOH: he declines more frequent f/u here, due to high copay; he is not using continuous glucose monitor, due to cost.   Other: He works 3rd shift, in grocery distribution; he takes multiple daily injections, but basal insulin is emphasized.   Interval history: no cbg record, but states cbg's vary from 50-220.  He takes meds as rx'ed.  Low BP is chronic.   Past Medical History:  Diagnosis Date   Arthritis    Diabetes mellitus without complication ([place])    Hyperlipidemia    Hypertension    Stroke ([place]) 05/2020    Past Surgical History:  Procedure Laterality Date   LEFT HEART CATH AND CORONARY ANGIOGRAPHY N/A 08/18/2020   Procedure: LEFT HEART CATH AND CORONARY ANGIOGRAPHY;  Surgeon: Martinique, Peter M, MD;  Location: [facility];  Service: Cardiovascular;  Laterality: N/A;   TONSILLECTOMY      Social History   Socioeconomic History   Marital status: Married    Spouse name: [name]   Number of children: 4   Years of education: Not on file   Highest education level: Not on file  Occupational History   Not on file  Tobacco Use   Smoking status: Former   Smokeless tobacco: Never   Tobacco comments:    Quit in 2017   Vaping Use   Vaping Use: Never used  Substance and Sexual Activity   Alcohol use: Yes    Comment: Socially   Drug use: Never   Sexual activity: Yes  Other Topics Concern   Not on file  Social History Narrative   Lives with wife   Social Determinants of Health   Financial Resource Strain: Not on file  Food Insecurity: Not on file  Transportation Needs: Not on file  Physical Activity:  Not on file  Stress: Not on file  Social Connections: Not on file  Intimate Partner Violence: Not on file    Current Outpatient Medications on File Prior to Visit  Medication Sig Dispense Refill   acetaminophen (TYLENOL) 500 MG tablet Take 500 mg by mouth every 6 (six) hours as needed for moderate pain or headache.     apixaban (ELIQUIS) 5 MG TABS tablet Take 1 tablet (5 mg total) by mouth 2 (two) times daily. 60 tablet 2   Aspirin-Caffeine (BC FAST PAIN RELIEF PO) Take 1 packet by mouth daily as needed (pain).     carbamide peroxide (DEBROX) 6.5 % OTIC solution Place 5 drops into both ears 2 (two) times daily. 15 mL 2   carvedilol (COREG) 12.5 MG tablet Take 1 tablet (12.5 mg total) by mouth 2 (two) times daily with a meal. 180 tablet 3   Continuous Blood Gluc Sensor (FREESTYLE LIBRE 14 DAY SENSOR) MISC 1 Device by Does not apply route every 14 (fourteen) days. 6 each 3   diclofenac Sodium (VOLTAREN) 1 % GEL Rub a small grape sized dollop into knee up to 3 times daily as needed. 350 g 1   Evolocumab (REPATHA SURECLICK) 389 MG/ML SOAJ Inject 140 mg into the skin every 14 (fourteen) days. 2 mL 11   ezetimibe (  ZETIA) 10 MG tablet Take 1 tablet (10 mg total) by mouth daily. 90 tablet 3   glucose blood test strip 1 each by Other route 2 (two) times daily. And lancets 2/day 200 each 3   Insulin Pen Needle (PEN NEEDLES) 32G X 4 MM MISC 1 Package by Does not apply route 4 (four) times daily. 360 each 2   Lidocaine 4 % PTCH Apply 1 patch topically daily as needed (pain).     rosuvastatin (CRESTOR) 40 MG tablet Take 1 tablet (40 mg total) by mouth daily. 30 tablet 1   sacubitril-valsartan (ENTRESTO) 24-26 MG Take 1 tablet by mouth 2 (two) times daily. 60 tablet 6   sildenafil (VIAGRA) 100 MG tablet TAKE ONE TABLET BY MOUTH DAILY AS NEEDED FOR ERECTILE DYSFUNCTION (Patient taking differently: Take 100 mg by mouth as needed for erectile dysfunction.) 30 tablet 2   Current Facility-Administered  Medications on File Prior to Visit  Medication Dose Route Frequency Provider Last Rate Last Admin   sodium chloride flush (NS) 0.9 % injection 3 mL  3 mL Intravenous Q12H Almyra Deforest, PA        No Known Allergies  Family History  Problem Relation Age of Onset   Diabetes Mother    CAD Mother    Heart attack Mother        x 2   COPD Father    Colon cancer Neg Hx    Esophageal cancer Neg Hx    Rectal cancer Neg Hx    Stomach cancer Neg Hx     BP (!) 90/50 (BP Location: Left Arm, Patient Position: Sitting, Cuff Size: Normal)    Pulse 90    Ht 5\' 10"  (1.778 m)    Wt 170 lb (77.1 kg)    SpO2 97%    BMI 24.39 kg/m    Review of Systems     Objective:   Physical Exam   Lab Results  Component Value Date   CREATININE 1.38 (H) 10/18/2020   BUN 26 (H) 10/18/2020   NA 138 10/18/2020   K 4.6 10/18/2020   CL 106 10/18/2020   CO2 23 10/18/2020    Lab Results  Component Value Date   HGBA1C 6.9 (A) 01/05/2021      Assessment & Plan:  Insulin-requiring type 2 DM: overcontrolled Hypoglycemia, due to insulin: this limits aggressiveness of glycemic control  Patient Instructions  Please stop the metformin, and:  Please continue the same other medications.   check your blood sugar twice a day.  vary the time of day when you check, between before the 3 meals, and at bedtime.  also check if you have symptoms of your blood sugar being too high or too low.  please keep a record of the readings and bring it to your next appointment here (or you can bring the meter itself).  You can write it on any piece of paper.  please call us sooner if your blood sugar goes below 70, or if you have a lot of readings over 200.   Please come back for a follow-up appointment in 2-3 months.

## 2021-01-05 NOTE — Patient Instructions (Addendum)
Please stop the metformin, and:  Please continue the same other medications.   check your blood sugar twice a day.  vary the time of day when you check, between before the 3 meals, and at bedtime.  also check if you have symptoms of your blood sugar being too high or too low.  please keep a record of the readings and bring it to your next appointment here (or you can bring the meter itself).  You can write it on any piece of paper.  please call us sooner if your blood sugar goes below 70, or if you have a lot of readings over 200.   Please come back for a follow-up appointment in 2-3 months.

## 2021-01-09 ENCOUNTER — Other Ambulatory Visit: Payer: Self-pay | Admitting: Endocrinology

## 2021-01-09 ENCOUNTER — Encounter: Payer: Self-pay | Admitting: Endocrinology

## 2021-01-09 MED ORDER — LANTUS SOLOSTAR 100 UNIT/ML ~~LOC~~ SOPN: 40.0000 [IU] | PEN_INJECTOR | SUBCUTANEOUS | 99 refills | Status: DC

## 2021-01-10 ENCOUNTER — Other Ambulatory Visit (HOSPITAL_COMMUNITY): Payer: Managed Care, Other (non HMO)

## 2021-01-17 LAB — BASIC METABOLIC PANEL
BUN/Creatinine Ratio: 20 (ref 9–20)
BUN: 24 mg/dL (ref 6–24)
CO2: 25 mmol/L (ref 20–29)
Calcium: 9.5 mg/dL (ref 8.7–10.2)
Chloride: 104 mmol/L (ref 96–106)
Creatinine, Ser: 1.22 mg/dL (ref 0.76–1.27)
Glucose: 122 mg/dL — ABNORMAL HIGH (ref 70–99)
Potassium: 5 mmol/L (ref 3.5–5.2)
Sodium: 141 mmol/L (ref 134–144)
eGFR: 68 mL/min/{1.73_m2} (ref >59)

## 2021-01-18 ENCOUNTER — Ambulatory Visit: Payer: Managed Care, Other (non HMO) | Admitting: Adult Health

## 2021-01-18 ENCOUNTER — Encounter: Payer: Self-pay | Admitting: Adult Health

## 2021-01-18 VITALS — BP 120/71 | HR 96 | Ht 70.0 in | Wt 172.0 lb

## 2021-01-18 DIAGNOSIS — G5621 Lesion of ulnar nerve, right upper limb: Secondary | ICD-10-CM | POA: Diagnosis not present

## 2021-01-18 DIAGNOSIS — I639 Cerebral infarction, unspecified: Secondary | ICD-10-CM

## 2021-01-18 NOTE — Patient Instructions (Signed)
Continue Eliquis (apixaban) daily  and Crestor, Zetia and Repatha for secondary stroke prevention  Continue to follow with cardiology as scheduled  Continue to follow up with PCP/cardiology/endocrinology regarding cholesterol, blood pressure and diabetes management  Maintain strict control of hypertension with blood pressure goal below 130/90, diabetes with hemoglobin A1c goal below 7.0 % and cholesterol with LDL cholesterol (bad cholesterol) goal below 70 mg/dL.   Signs of a Stroke? Follow the BEFAST method:  Balance Watch for a sudden loss of balance, trouble with coordination or vertigo Eyes Is there a sudden loss of vision in one or both eyes? Or double vision?  Face: Ask the person to smile. Does one side of the face droop or is it numb?  Arms: Ask the person to raise both arms. Does one arm drift downward? Is there weakness or numbness of a leg? Speech: Ask the person to repeat a simple phrase. Does the speech sound slurred/strange? Is the person confused ? Time: If you observe any of these signs, call 911.  Continue to monitor right arm symptoms - if this should worsen, become more persistent, or interfere with daily functioning please let us know       Thank you for coming to see [place] at [facility]. I hope we have been able to provide you high quality care today.  You may receive a patient satisfaction survey over the next few weeks. We would appreciate your feedback and comments so that we may continue to improve ourselves and the health of our patients.

## 2021-01-18 NOTE — Progress Notes (Signed)
[facility] [address] [place]. [place] [address] [phone]       STROKE FOLLOW UP NOTE  John [name] Date of Birth:  September 01, 1960 Medical Record Number:  [id number]   Reason for Referral: stroke follow up    SUBJECTIVE:   CHIEF COMPLAINT:  Chief Complaint  Patient presents with   Follow-up    RM 3 alone Pt is well and stable, mentions R arm numbness periodically. No new concerns       HPI:   Update 01/18/2021 JM: Returns for 81-month stroke follow-up unaccompanied.  Overall stable without new or reoccurring stroke/TIA symptoms.  Compliant on Eliquis 5mg  BID and Crestor 40mg  daily as well as recently added Zetia and Repatha for elevated LDL and CAD without side effects. Monitored by cardiology with plans on repeat LDL in 2 weeks. Blood pressure today 120/71.  Completed cardiac cath 8/18 and plans on repeat 2D echo in March - per cards OV review, if EF <35%, will need to consider ICD. A1c 6.9 (01/05/21) and last LDL 97 (09/08/20). Does mention occasional (1x per week) right elbow to wrist and occasionally into fingers numbness lasting less than 1 minute and subsides - present over the past 7-8 months, denies any associated pain or weakness.  No known aggravating factors, does not worsen with activity, movement or certain positions, will resolve on own. Denies shoulder, neck, elbow or wrist pain.  No other concerns at this time.   History provided for reference purposes only Initial visit 07/13/2020 JM: John [name] is being seen for hospital follow-up unaccompanied.  He has been doing well since discharge without new or reoccurring stroke/TIA symptoms.  Reports compliance on Eliquis and Crestor tolerating without associated side effects.  Blood pressure today 106/77. Does not routinely monitor blood pressure at home as typically stable.  He does endorse making diet modifications since discharge and trying to increase daily activity.  He has since had follow-up  with cardiology and has an additional follow-up scheduled 8/11 and if remains stable, plan on pursuing cardiac cath.  No further concerns at this time.  Stroke admission 05/31/2020 61 year old male with history of hypertension, hyperlipidemia, diabetes who presented on 05/31/2020 to ER for left hand and arm weakness numbness.  Personally reviewed hospitalization pertinent progress notes, lab work and imaging with summary provided.  Evaluated by Dr. Erlinda [name] with stroke work-up revealing right frontal cortical small infarct s/p tPA possibly due to new diagnosed cardiomyopathy, less likely right carotid atherosclerosis.  CTA head/neck showed probable attenuation right M4 MCA branch as well as plaque in the right greater than left proximal internal carotids less than 50% stenosis.  Imaging also showed remote lacunar infarct in the left cerebellum.  2D echo EF 30% with swelling apex with increased risk for thrombus formation -cardiology recommended Eliquis 5 mg twice daily for secondary stroke prevention in setting of new diagnosis of cardiomyopathy and plan on cardiac cath outpatient.  LDL 64.  A1c 7.9.  Remained on Crestor 20 mg daily.  Other stroke risk factors include former tobacco use, EtOH use, recent travel, prior history of stroke on imaging, CAD and new diagnosis of cardiomyopathy.  PT/OT no recommendations.      ROS:   14 system review of systems performed and negative with exception of those listed in HPI  PMH:  Past Medical History:  Diagnosis Date   Arthritis    Diabetes mellitus without complication ([place])    Hyperlipidemia    Hypertension    Stroke (  [place]) 05/2020    PSH:  Past Surgical History:  Procedure Laterality Date   LEFT HEART CATH AND CORONARY ANGIOGRAPHY N/A 08/18/2020   Procedure: LEFT HEART CATH AND CORONARY ANGIOGRAPHY;  Surgeon: Martinique, Peter M, MD;  Location: [facility];  Service: Cardiovascular;  Laterality: N/A;   TONSILLECTOMY      Social History:  Social  History   Socioeconomic History   Marital status: Married    Spouse name: John [name]   Number of children: 4   Years of education: Not on file   Highest education level: Not on file  Occupational History   Not on file  Tobacco Use   Smoking status: Former   Smokeless tobacco: Never   Tobacco comments:    Quit in 2017   Vaping Use   Vaping Use: Never used  Substance and Sexual Activity   Alcohol use: Yes    Comment: Socially   Drug use: Never   Sexual activity: Yes  Other Topics Concern   Not on file  Social History Narrative   Lives with wife   Social Determinants of Health   Financial Resource Strain: Not on file  Food Insecurity: Not on file  Transportation Needs: Not on file  Physical Activity: Not on file  Stress: Not on file  Social Connections: Not on file  Intimate Partner Violence: Not on file    Family History:  Family History  Problem Relation Age of Onset   Diabetes Mother    CAD Mother    Heart attack Mother        x 2   COPD Father    Colon cancer Neg Hx    Esophageal cancer Neg Hx    Rectal cancer Neg Hx    Stomach cancer Neg Hx     Medications:   Current Outpatient Medications on File Prior to Visit  Medication Sig Dispense Refill   acetaminophen (TYLENOL) 500 MG tablet Take 500 mg by mouth every 6 (six) hours as needed for moderate pain or headache.     apixaban (ELIQUIS) 5 MG TABS tablet Take 1 tablet (5 mg total) by mouth 2 (two) times daily. 60 tablet 2   Aspirin-Caffeine (BC FAST PAIN RELIEF PO) Take 1 packet by mouth daily as needed (pain).     carbamide peroxide (DEBROX) 6.5 % OTIC solution Place 5 drops into both ears 2 (two) times daily. 15 mL 2   carvedilol (COREG) 12.5 MG tablet Take 1 tablet (12.5 mg total) by mouth 2 (two) times daily with a meal. 180 tablet 3   Continuous Blood Gluc Sensor (FREESTYLE LIBRE 14 DAY SENSOR) MISC 1 Device by Does not apply route every 14 (fourteen) days. 6 each 3   diclofenac Sodium (VOLTAREN) 1 % GEL  Rub a small grape sized dollop into knee up to 3 times daily as needed. 350 g 1   Dulaglutide (TRULICITY) 1.5 JX/9.1YN SOPN Inject 1.5 mg into the skin once a week. 6 mL 3   Evolocumab (REPATHA SURECLICK) 829 MG/ML SOAJ Inject 140 mg into the skin every 14 (fourteen) days. 2 mL 11   ezetimibe (ZETIA) 10 MG tablet Take 1 tablet (10 mg total) by mouth daily. 90 tablet 3   FARXIGA 10 MG TABS tablet Take 1 tablet (10 mg total) by mouth daily. 90 tablet 3   glucose blood test strip 1 each by Other route 2 (two) times daily. And lancets 2/day 200 each 3   insulin glargine (LANTUS SOLOSTAR) 100 UNIT/ML  Solostar Pen Inject 40 Units into the skin every morning. And pen needles 1/day 45 mL PRN   Insulin Pen Needle (PEN NEEDLES) 32G X 4 MM MISC 1 Package by Does not apply route 4 (four) times daily. 360 each 2   Lidocaine 4 % PTCH Apply 1 patch topically daily as needed (pain).     rosuvastatin (CRESTOR) 40 MG tablet Take 1 tablet (40 mg total) by mouth daily. 30 tablet 1   sacubitril-valsartan (ENTRESTO) 24-26 MG Take 1 tablet by mouth 2 (two) times daily. 60 tablet 6   sildenafil (VIAGRA) 100 MG tablet TAKE ONE TABLET BY MOUTH DAILY AS NEEDED FOR ERECTILE DYSFUNCTION (Patient taking differently: Take 100 mg by mouth as needed for erectile dysfunction.) 30 tablet 2   Current Facility-Administered Medications on File Prior to Visit  Medication Dose Route Frequency Provider Last Rate Last Admin   sodium chloride flush (NS) 0.9 % injection 3 mL  3 mL Intravenous Q12H Meng, [place], [place]        Allergies:  No Known Allergies    OBJECTIVE:  Physical Exam  Vitals:   01/18/21 0728  BP: 120/71  Pulse: 96  Weight: 172 lb (78 kg)  Height: 5\' 10"  (1.778 [name])    Body mass index is 24.68 kg/[name]. No results found.  General: well developed, well nourished, very pleasant middle-age Caucasian male seated, in no evident distress Head: head normocephalic and atraumatic.   Neck: supple with no carotid or  supraclavicular bruits Cardiovascular: regular rate and rhythm, no murmurs Musculoskeletal: no deformity; no tenderness with pressure over ulnar nerve, tinel at elbow negative, full neck and R shoulder ROM Skin:  no rash/petichiae Vascular:  Normal pulses all extremities   Neurologic Exam Mental Status: Awake and fully alert.  Fluent speech and language.  Oriented to place and time. Recent and remote memory intact. Attention span, concentration and fund of knowledge appropriate. Mood and affect appropriate.  Cranial Nerves: Pupils equal, briskly reactive to light. Extraocular movements full without nystagmus. Visual fields full to confrontation. Hearing intact. Facial sensation intact. Face, tongue, palate moves normally and symmetrically.  Motor: Normal bulk and tone. Normal strength in all tested extremity muscles Sensory.: intact to touch , pinprick , position and vibratory sensation.  Coordination: Rapid alternating movements normal in all extremities. Finger-to-nose and heel-to-shin performed accurately bilaterally. Gait and Station: Arises from chair without difficulty. Stance is normal. Gait demonstrates normal stride length and balance with use of cane (d/t arthritis).  Reflexes: 1+ and symmetric. Toes downgoing.        ASSESSMENT: John [name] is a 61 y.o. 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  Vascular risk factors include HTN, HLD, DM, R>L carotid stenosis, prior cerebellar stroke on imaging, cardiomyopathy, CAD and former tobacco use.      PLAN:  Right frontal stroke:  Recovered well without residual deficit.   Continue Eliquis (apixaban) daily  and Crestor 20 mg daily, Zetia 10 mg daily and Repatha for secondary stroke prevention.   Discussed secondary stroke prevention measures and importance of close PCP follow up for aggressive stroke risk factor management  Cardiomyopathy,  CAD: Followed by cardiology.  Completed cardiac cath.  Scheduled for 2D echo in March HTN: BP goal <130/90.  Stable on current regimen per PCP HLD: LDL goal <70. Prior LDL 97 up from 64 (09/2020) on Crestor 20 mg daily and added Zetia 10mg  daily and Repatha  per PCP after recent elevated LDL.  DMII: A1c goal<7.0. Recent A1c 6.9 (01/2021).  Routinely follows with endocrinology Right arm numbness: likely mild ulnar nerve entrapment. Exam normal. Will hold off on further testing as not constant, only mild symptoms, does not interfere daily functioning or activity which patient agreed with.  Advised to monitor for any progression    Stable from stroke standpoint routinely followed by cardiology and PCP - follow up as needed   CC:  PCP: Libby Maw, MD    I spent 36 minutes of face-to-face and non-face-to-face time with patient.  This included previsit chart review, lab review, study review, electronic health record documentation, and patient education regarding prior stroke including etiology, secondary stroke prevention measures and aggressive stroke risk factor management, right arm symptoms and possible etiologies and answered all other questions to patient satisfaction   Frann Rider, AGNP-BC  [facility] [address] [place], [place] [address]  Phone [phone] Fax [phone] Note: This document was prepared with digital dictation and possible smart phrase technology. Any transcriptional errors that result from this process are unintentional.

## 2021-01-18 NOTE — Progress Notes (Deleted)
[facility] [address] [place]. [place] [address] [phone]       STROKE FOLLOW UP NOTE  [name] Date of Birth:  01/15/1960 Medical Record Number:  [id number]   Reason for Referral: stroke follow up    SUBJECTIVE:   CHIEF COMPLAINT:  No chief complaint on file.   HPI:   Update 01/18/2021 JM: Returns for 38-month stroke follow-up.  Overall stable without new or reoccurring stroke/TIA symptoms.  Compliant on Eliquis 5mg  BID and Crestor 40mg  daily as well as recently added Zetia for elevated LDL without side effects.  Blood pressure today ***.  Completed cardiac cath 8/18 and plans on repeat 2D echo in March - per cards OV review, if EF <35%, will need to consider ICD. No new neurological concerns at this time.        History provided for reference purposes only Initial visit 07/13/2020 JM: John [name] is being seen for hospital follow-up unaccompanied.  He has been doing well since discharge without new or reoccurring stroke/TIA symptoms.  Reports compliance on Eliquis and Crestor tolerating without associated side effects.  Blood pressure today 106/77. Does not routinely monitor blood pressure at home as typically stable.  He does endorse making diet modifications since discharge and trying to increase daily activity.  He has since had follow-up with cardiology and has an additional follow-up scheduled 8/11 and if remains stable, plan on pursuing cardiac cath.  No further concerns at this time.  Stroke admission 05/31/2020 61 year old male with history of hypertension, hyperlipidemia, diabetes who presented on 05/31/2020 to ER for left hand and arm weakness numbness.  Personally reviewed hospitalization pertinent progress notes, lab work and imaging with summary provided.  Evaluated by Dr. Erlinda [name] with stroke work-up revealing right frontal cortical small infarct s/p tPA possibly due to new diagnosed cardiomyopathy, less likely right carotid  atherosclerosis.  CTA head/neck showed probable attenuation right M4 MCA branch as well as plaque in the right greater than left proximal internal carotids less than 50% stenosis.  Imaging also showed remote lacunar infarct in the left cerebellum.  2D echo EF 30% with swelling apex with increased risk for thrombus formation -cardiology recommended Eliquis 5 mg twice daily for secondary stroke prevention in setting of new diagnosis of cardiomyopathy and plan on cardiac cath outpatient.  LDL 64.  A1c 7.9.  Remained on Crestor 20 mg daily.  Other stroke risk factors include former tobacco use, EtOH use, recent travel, prior history of stroke on imaging, CAD and new diagnosis of cardiomyopathy.  PT/OT no recommendations.      ROS:   14 system review of systems performed and negative with exception of no complaints  PMH:  Past Medical History:  Diagnosis Date   Arthritis    Diabetes mellitus without complication ([place])    Hyperlipidemia    Hypertension    Stroke ([place]) 05/2020    PSH:  Past Surgical History:  Procedure Laterality Date   LEFT HEART CATH AND CORONARY ANGIOGRAPHY N/A 08/18/2020   Procedure: LEFT HEART CATH AND CORONARY ANGIOGRAPHY;  Surgeon: [name], John M, MD;  Location: [facility];  Service: Cardiovascular;  Laterality: N/A;   TONSILLECTOMY      Social History:  Social History   Socioeconomic History   Marital status: Married    Spouse name: John [name]   Number of children: 4   Years of education: Not on file   Highest education level: Not on file  Occupational History   Not  on file  Tobacco Use   Smoking status: Former   Smokeless tobacco: Never   Tobacco comments:    Quit in 2017   Vaping Use   Vaping Use: Never used  Substance and Sexual Activity   Alcohol use: Yes    Comment: Socially   Drug use: Never   Sexual activity: Yes  Other Topics Concern   Not on file  Social History Narrative   Lives with wife   Social Determinants of Health    Financial Resource Strain: Not on file  Food Insecurity: Not on file  Transportation Needs: Not on file  Physical Activity: Not on file  Stress: Not on file  Social Connections: Not on file  Intimate Partner Violence: Not on file    Family History:  Family History  Problem Relation Age of Onset   Diabetes Mother    CAD Mother    Heart attack Mother        x 2   COPD Father    Colon cancer Neg Hx    Esophageal cancer Neg Hx    Rectal cancer Neg Hx    Stomach cancer Neg Hx     Medications:   Current Outpatient Medications on File Prior to Visit  Medication Sig Dispense Refill   acetaminophen (TYLENOL) 500 MG tablet Take 500 mg by mouth every 6 (six) hours as needed for moderate pain or headache.     apixaban (ELIQUIS) 5 MG TABS tablet Take 1 tablet (5 mg total) by mouth 2 (two) times daily. 60 tablet 2   Aspirin-Caffeine (BC FAST PAIN RELIEF PO) Take 1 packet by mouth daily as needed (pain).     carbamide peroxide (DEBROX) 6.5 % OTIC solution Place 5 drops into both ears 2 (two) times daily. 15 mL 2   carvedilol (COREG) 12.5 MG tablet Take 1 tablet (12.5 mg total) by mouth 2 (two) times daily with a meal. 180 tablet 3   Continuous Blood Gluc Sensor (FREESTYLE LIBRE 14 DAY SENSOR) MISC 1 Device by Does not apply route every 14 (fourteen) days. 6 each 3   diclofenac Sodium (VOLTAREN) 1 % GEL Rub a small grape sized dollop into knee up to 3 times daily as needed. 350 g 1   Dulaglutide (TRULICITY) 1.5 NF/6.2ZH SOPN Inject 1.5 mg into the skin once a week. 6 mL 3   Evolocumab (REPATHA SURECLICK) 086 MG/ML SOAJ Inject 140 mg into the skin every 14 (fourteen) days. 2 mL 11   ezetimibe (ZETIA) 10 MG tablet Take 1 tablet (10 mg total) by mouth daily. 90 tablet 3   FARXIGA 10 MG TABS tablet Take 1 tablet (10 mg total) by mouth daily. 90 tablet 3   glucose blood test strip 1 each by Other route 2 (two) times daily. And lancets 2/day 200 each 3   insulin glargine (LANTUS SOLOSTAR) 100  UNIT/ML Solostar Pen Inject 40 Units into the skin every morning. And pen needles 1/day 45 mL PRN   Insulin Pen Needle (PEN NEEDLES) 32G X 4 MM MISC 1 Package by Does not apply route 4 (four) times daily. 360 each 2   Lidocaine 4 % PTCH Apply 1 patch topically daily as needed (pain).     rosuvastatin (CRESTOR) 40 MG tablet Take 1 tablet (40 mg total) by mouth daily. 30 tablet 1   sacubitril-valsartan (ENTRESTO) 24-26 MG Take 1 tablet by mouth 2 (two) times daily. 60 tablet 6   sildenafil (VIAGRA) 100 MG tablet TAKE ONE TABLET BY  MOUTH DAILY AS NEEDED FOR ERECTILE DYSFUNCTION (Patient taking differently: Take 100 mg by mouth as needed for erectile dysfunction.) 30 tablet 2   Current Facility-Administered Medications on File Prior to Visit  Medication Dose Route Frequency Provider Last Rate Last Admin   sodium chloride flush (NS) 0.9 % injection 3 mL  3 mL Intravenous Q12H Meng, [place], [place]        Allergies:  No Known Allergies    OBJECTIVE:  Physical Exam  There were no vitals filed for this visit.  There is no height or weight on file to calculate BMI. No results found.   General: well developed, well nourished, very pleasant middle-age Caucasian male seated, in no evident distress Head: head normocephalic and atraumatic.   Neck: supple with no carotid or supraclavicular bruits Cardiovascular: regular rate and rhythm, no murmurs Musculoskeletal: no deformity Skin:  no rash/petichiae Vascular:  Normal pulses all extremities   Neurologic Exam Mental Status: Awake and fully alert.  Fluent speech and language.  Oriented to place and time. Recent and remote memory intact. Attention span, concentration and fund of knowledge appropriate. Mood and affect appropriate.  Cranial Nerves: Pupils equal, briskly reactive to light. Extraocular movements full without nystagmus. Visual fields full to confrontation. Hearing intact. Facial sensation intact. Face, tongue, palate moves normally and  symmetrically.  Motor: Normal bulk and tone. Normal strength in all tested extremity muscles Sensory.: intact to touch , pinprick , position and vibratory sensation.  Coordination: Rapid alternating movements normal in all extremities. Finger-to-nose and heel-to-shin performed accurately bilaterally. Gait and Station: Arises from chair without difficulty. Stance is normal. Gait demonstrates normal stride length and balance with use of cane (d/t arthritis).  Reflexes: 1+ and symmetric. Toes downgoing.        ASSESSMENT: John [name] is a 61 y.o. 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  Vascular risk factors include HTN, HLD, DM, R>L carotid stenosis, prior cerebellar stroke on imaging, new diagnosis of cardiomyopathy, probable CAD and former tobacco use.      PLAN:  Right frontal stroke:  Recovered well without residual deficit.   Continue Eliquis (apixaban) daily  and Crestor 20 mg daily and Zetia 10 mg daily for secondary stroke prevention.   Discussed secondary stroke prevention measures and importance of close PCP follow up for aggressive stroke risk factor management  Cardiomyopathy, new dx: EF 30%; per recent 2D echo, distal septal and apical wall motion abnormality with moderate LV dysfunction as well as swelling in the apex with increased risk of thrombus formation -cardiology recommended initiating Eliquis for secondary stroke prevention in setting of new diagnosis of cardiomyopathy. Has f/u visit with cards 8/11 and per note, if remains stable from stroke standpoint, will proceed with cardiac cath HTN: BP goal <130/90.  Stable on current regimen per PCP HLD: LDL goal <70. Prior LDL 97 up from 64 (09/2020) on Crestor 20 mg daily and added Zetia 10mg  daily per PCP after recent elevated LDL.  DMII: A1c goal<7.0. Recent A1c 6.9 (01/2021).  Routinely follows with endocrinology    Follow up  in 6 months or call earlier if needed   CC:  [place] provider: Dr. Leonie Man PCP: Libby Maw, MD    I spent 43 minutes of face-to-face and non-face-to-face time with patient.  This included previsit chart review, lab review, study review, electronic health record documentation, and patient education regarding prior stroke including etiology, secondary  stroke prevention measures and aggressive stroke risk factor management, future follow-up with cardiology and answered all other questions to patient satisfaction   Frann Rider, [facility]  [facility] [address] [place], [place] [address]  Phone [phone] Fax [phone] Note: This document was prepared with digital dictation and possible smart phrase technology. Any transcriptional errors that result from this process are unintentional.

## 2021-01-23 ENCOUNTER — Encounter: Payer: Self-pay | Admitting: *Deleted

## 2021-02-27 LAB — LIPID PANEL
Chol/HDL Ratio: 2 ratio (ref 0.0–5.0)
Cholesterol, Total: 82 mg/dL — ABNORMAL LOW (ref 100–199)
HDL: 42 mg/dL (ref >39)
LDL Chol Calc (NIH): 28 mg/dL (ref 0–99)
Triglycerides: 45 mg/dL (ref 0–149)
VLDL Cholesterol Cal: 12 mg/dL (ref 5–40)

## 2021-03-08 ENCOUNTER — Other Ambulatory Visit: Payer: Self-pay

## 2021-03-08 ENCOUNTER — Encounter: Payer: Self-pay | Admitting: Family Medicine

## 2021-03-08 ENCOUNTER — Ambulatory Visit: Payer: Managed Care, Other (non HMO) | Admitting: Family Medicine

## 2021-03-08 ENCOUNTER — Ambulatory Visit (INDEPENDENT_AMBULATORY_CARE_PROVIDER_SITE_OTHER): Payer: Managed Care, Other (non HMO)

## 2021-03-08 VITALS — BP 116/70 | HR 86 | Temp 97.4°F | Ht 70.0 in | Wt 171.2 lb

## 2021-03-08 DIAGNOSIS — M25562 Pain in left knee: Secondary | ICD-10-CM | POA: Diagnosis not present

## 2021-03-08 DIAGNOSIS — R9389 Abnormal findings on diagnostic imaging of other specified body structures: Secondary | ICD-10-CM | POA: Diagnosis not present

## 2021-03-08 DIAGNOSIS — Z0000 Encounter for general adult medical examination without abnormal findings: Secondary | ICD-10-CM

## 2021-03-08 DIAGNOSIS — G8929 Other chronic pain: Secondary | ICD-10-CM | POA: Diagnosis not present

## 2021-03-08 DIAGNOSIS — N1330 Unspecified hydronephrosis: Secondary | ICD-10-CM

## 2021-03-08 DIAGNOSIS — M25561 Pain in right knee: Secondary | ICD-10-CM

## 2021-03-08 DIAGNOSIS — Z1159 Encounter for screening for other viral diseases: Secondary | ICD-10-CM | POA: Diagnosis not present

## 2021-03-08 LAB — CBC
HCT: 45.2 % (ref 39.0–52.0)
Hemoglobin: 15.1 g/dL (ref 13.0–17.0)
MCHC: 33.4 g/dL (ref 30.0–36.0)
MCV: 90.8 fl (ref 78.0–100.0)
Platelets: 383 10*3/uL (ref 150.0–400.0)
RBC: 4.98 Mil/uL (ref 4.22–5.81)
RDW: 13.8 % (ref 11.5–15.5)
WBC: 9.1 10*3/uL (ref 4.0–10.5)

## 2021-03-08 LAB — PSA: PSA: 0.59 ng/mL (ref 0.10–4.00)

## 2021-03-08 IMAGING — DX DG KNEE COMPLETE 4+V*R*
4 series · 4 of 4 positions shown · non-contrast
Comparison: None.

CLINICAL DATA: 60-year-old male with chronic bilateral knee pain.

EXAM:
RIGHT KNEE - COMPLETE 4+ VIEW

[knee ap]
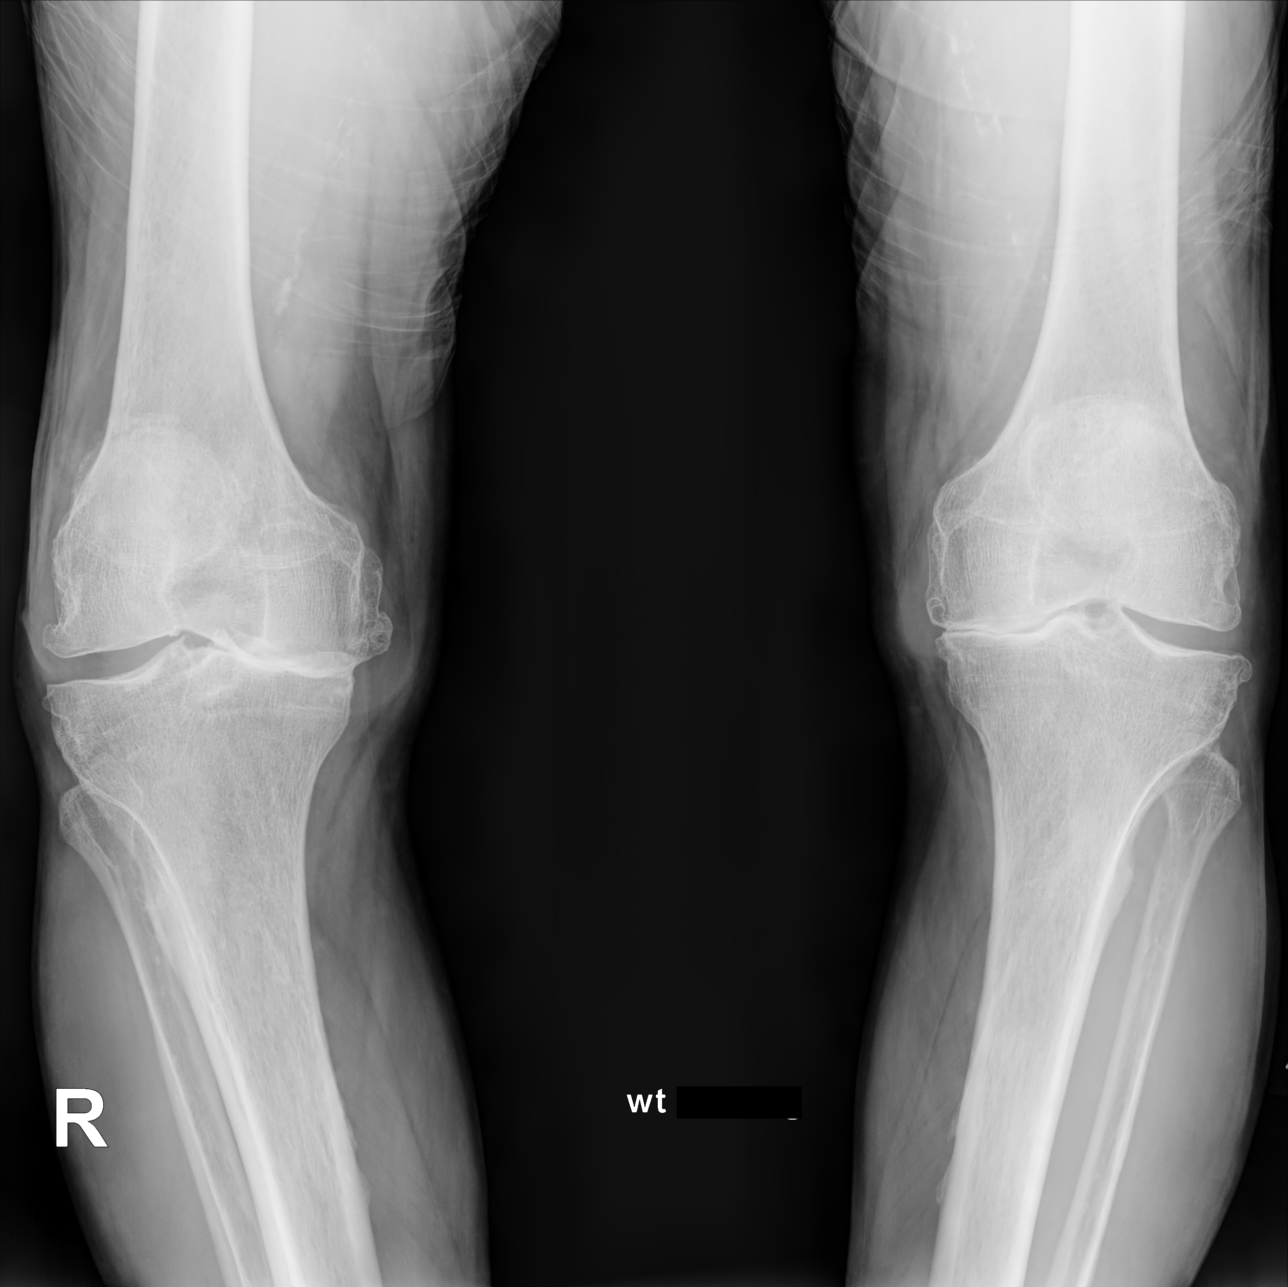

[knee [person_name] view pa]
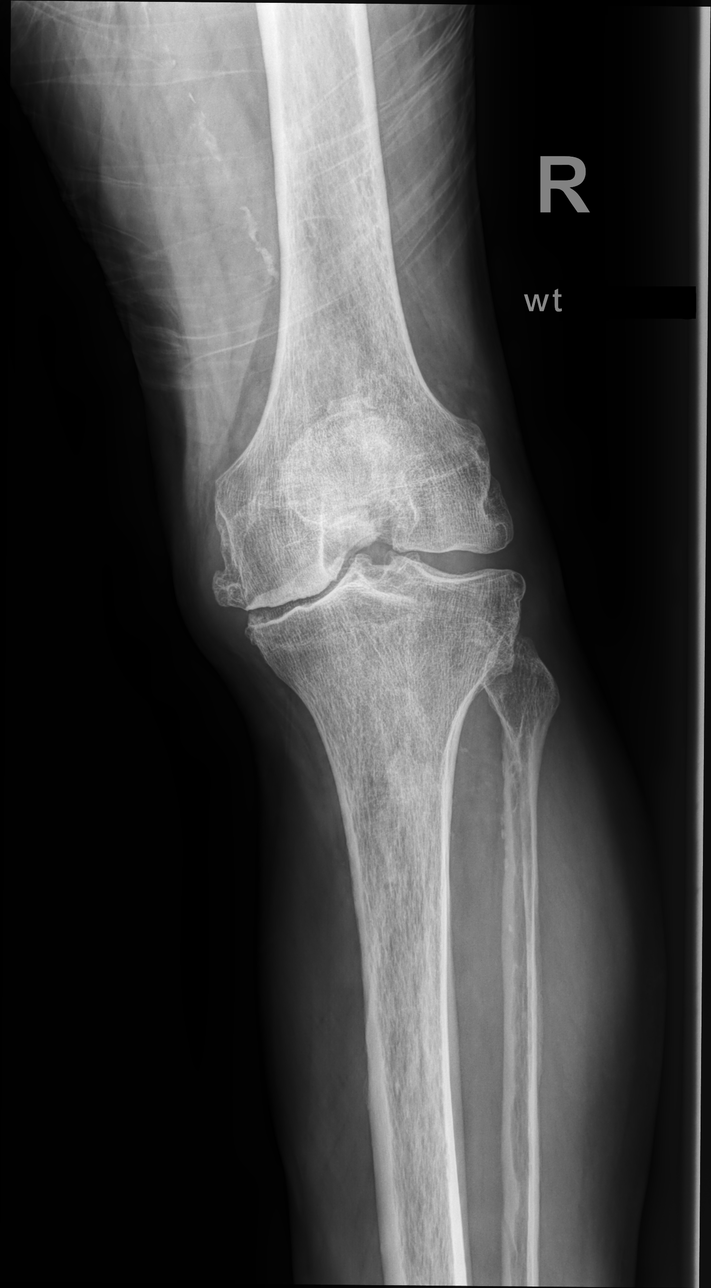

[knee lat]
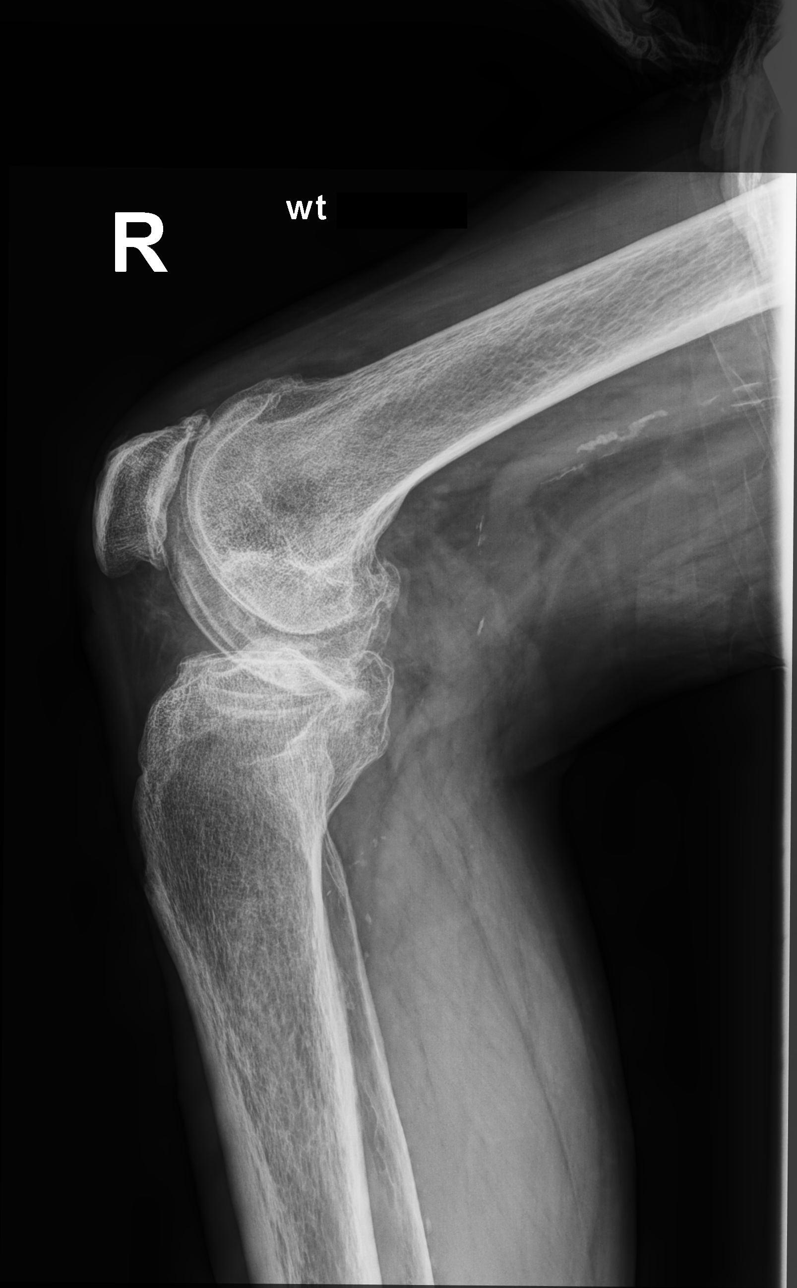

[patella (sunrise)]
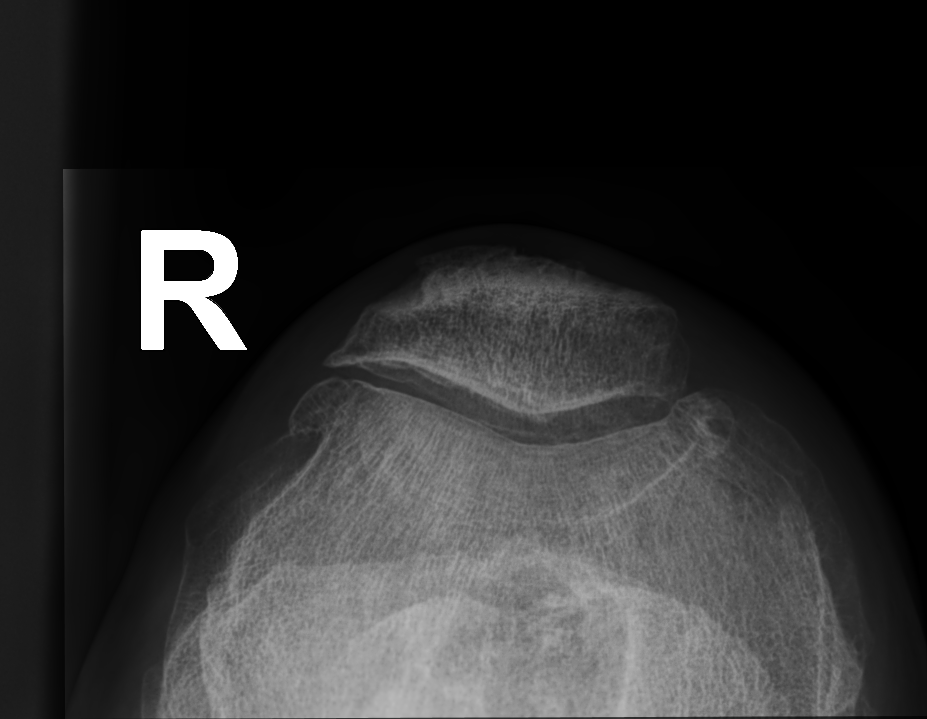

[4 of 4 positions shown; findings below may reference images not displayed]

FINDINGS: Weightbearing views. Severe bilateral medial compartment joint
degeneration on the AP weight-bearing view which includes both
knees. Bulky tricompartmental degenerative spurring. Moderate to
severe right patellofemoral compartment joint space loss. No joint
effusion. No acute osseous abnormality identified. Calcified
peripheral vascular disease.
IMPRESSION: 1. Severe tricompartmental right knee degeneration, severe in the
medial compartment and moderate to severe in the patellofemoral
compartment.
2. No acute osseous abnormality identified. No visible joint
effusion.
3. Calcified peripheral vascular disease.

## 2021-03-08 IMAGING — DX DG KNEE COMPLETE 4+V*L*
4 series · 4 of 4 positions shown · non-contrast
Comparison: None.

CLINICAL DATA: Chronic bilateral knee pain.

EXAM:
LEFT KNEE - COMPLETE 4+ VIEW

[knee ap]
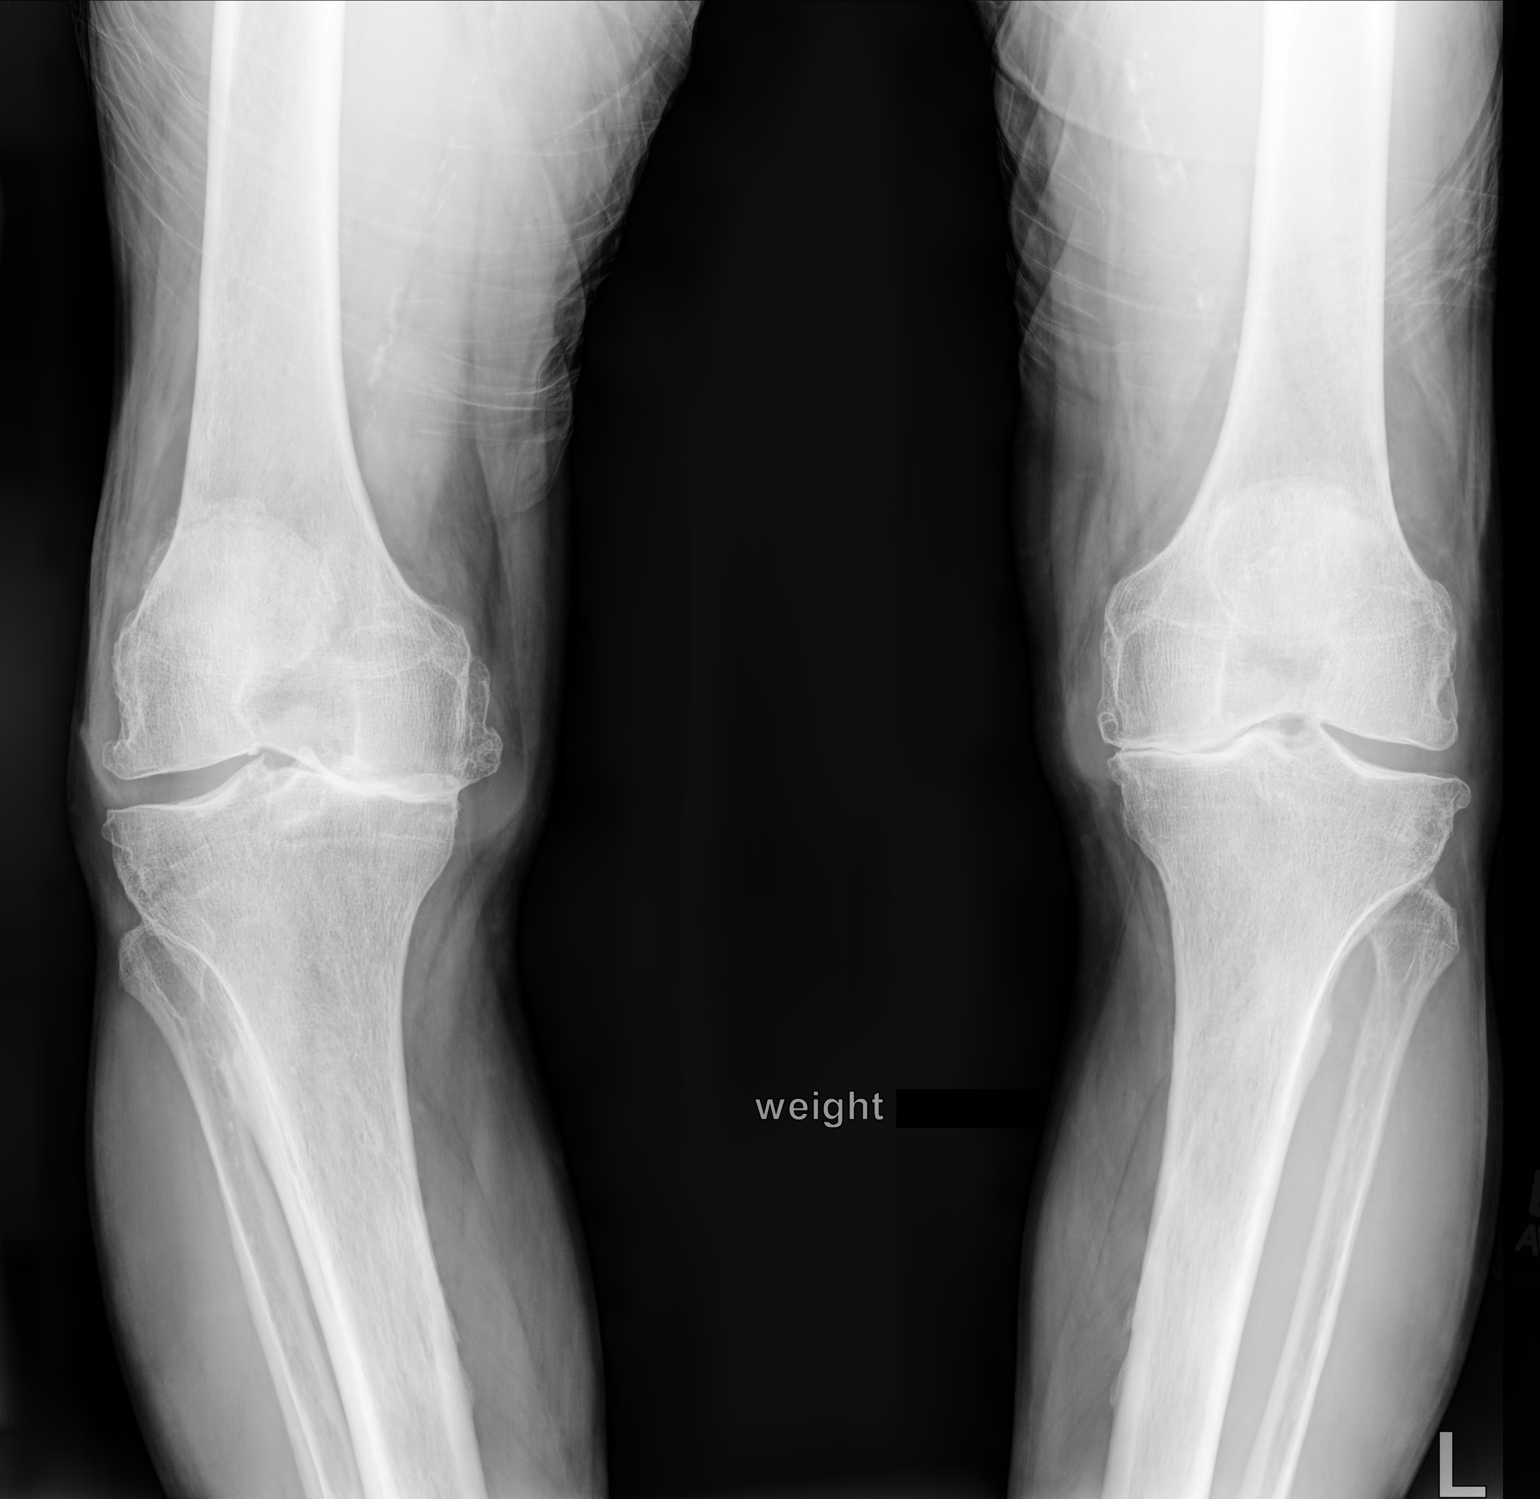

[knee [person_name] view pa]
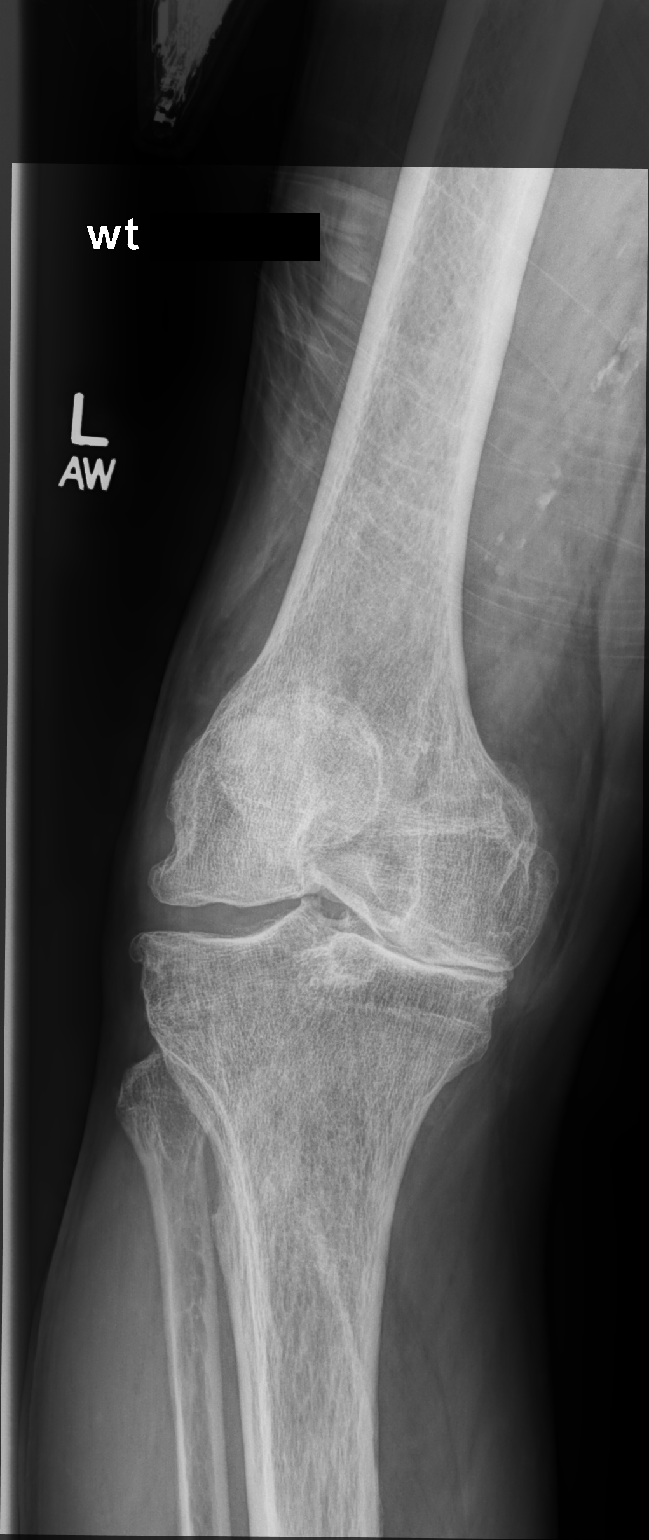

[knee lat]
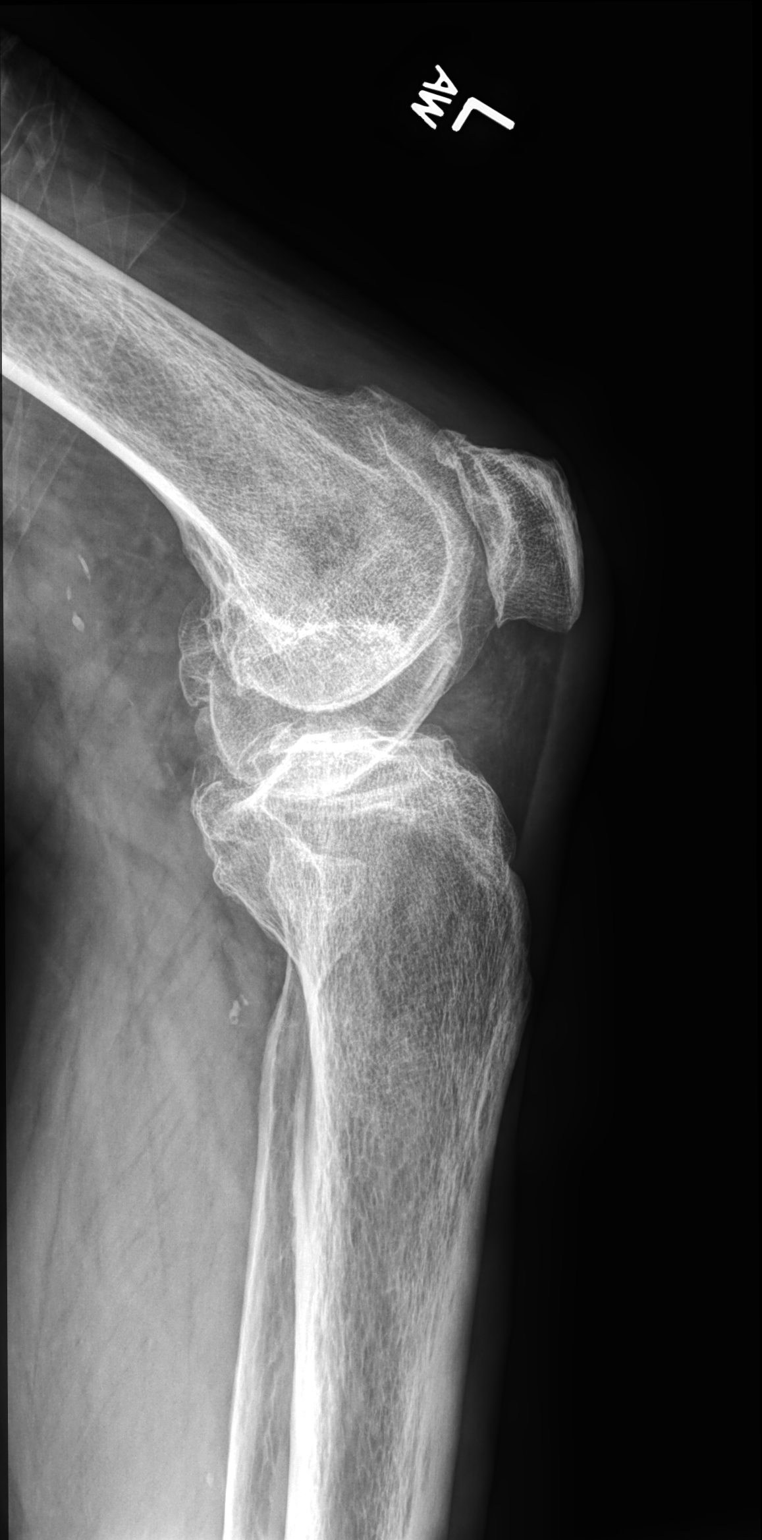

[patella (sunrise)]
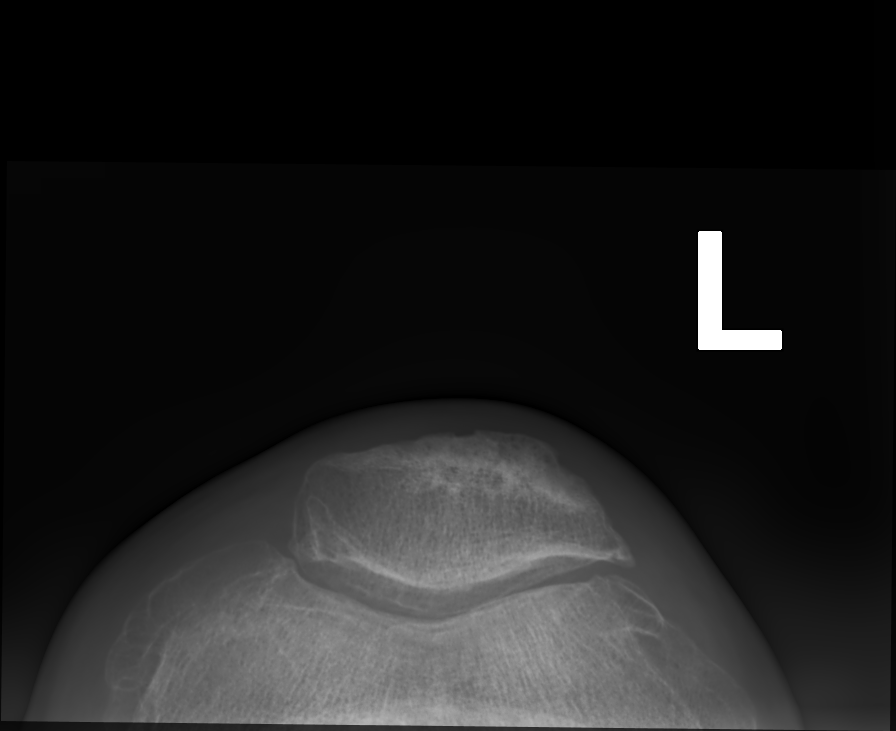

[4 of 4 positions shown; findings below may reference images not displayed]

FINDINGS: No evidence of fracture or dislocation.

Bilateral moderate to severe osteoarthritic changes with near
complete effacement of the medial joint spaces with weight-bearing
in both knees. Subchondral sclerosis, remodeling and osteophytosis
are seen in both knee joints.

Vascular calcifications are present.
IMPRESSION: Bilateral moderate to severe osteoarthritic changes of the knees
with near complete effacement of the medial joint spaces.

## 2021-03-08 NOTE — Addendum Note (Signed)
Addended by: Lynnea Ferrier on: 03/08/2021 10:29 AM ? ? Modules accepted: Orders ? ?

## 2021-03-08 NOTE — Progress Notes (Addendum)
Established Patient Office Visit  Subjective:  Patient ID: John [name], male    DOB: Sep 28, 1960  Age: 61 y.o. MRN: [id number]  CC:  Chief Complaint  Patient presents with   Follow-up    6 month follow up, no concerns. Patient not fasting.     HPI John [name] presents for a physical exam, follow-up of chronic knee pain.  Lab work recently checked last month and was reviewed in the chart.  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  Has improved his diet by lowering the fat and cholesterol.  Is active at work.  Past Medical History:  Diagnosis Date   Arthritis    Diabetes mellitus without complication (HCC)    Hyperlipidemia    Hypertension    Stroke (HCC) 05/2020    Past Surgical History:  Procedure Laterality Date   LEFT HEART CATH AND CORONARY ANGIOGRAPHY N/A 08/18/2020   Procedure: LEFT HEART CATH AND CORONARY ANGIOGRAPHY;  Surgeon: [place]az[place], Peter M, MD;  Location: [facility] INVASIVE CV LAB;  Service: Cardiovascular;  Laterality: N/A;   TONSILLECTOMY      Family History  Problem Relation Age of Onset   Diabetes Mother    CAD Mother    Heart attack Mother        x 2   COPD Father    Colon cancer Neg Hx    Esophageal cancer Neg Hx    Rectal cancer Neg Hx    Stomach cancer Neg Hx     Social History   Socioeconomic History   Marital status: Married    Spouse name: John [name]   Number of children: 4   Years of education: Not on file   Highest education level: Not on file  Occupational History   Not on file  Tobacco Use   Smoking status: Former   Smokeless tobacco: Never   Tobacco comments:    Quit in 2017   Vaping Use   Vaping Use: Never used  Substance and Sexual Activity   Alcohol use: Yes    Comment: Socially   Drug use: Never   Sexual activity: Yes  Other Topics Concern   Not on file  Social History Narrative   Lives with wife   Social Determinants of Health   Financial Resource Strain: Not on file  Food Insecurity: Not on file   Transportation Needs: Not on file  Physical Activity: Not on file  Stress: Not on file  Social Connections: Not on file  Intimate Partner Violence: Not on file    Outpatient Medications Prior to Visit  Medication Sig Dispense Refill   acetaminophen (TYLENOL) 500 MG tablet Take 500 mg by mouth every 6 (six) hours as needed for moderate pain or headache.     Aspirin-Caffeine (BC FAST PAIN RELIEF PO) Take 1 packet by mouth daily as needed (pain).     carbamide peroxide (DEBROX) 6.5 % OTIC solution Place 5 drops into both ears 2 (two) times daily. 15 mL 2   carvedilol (COREG) 12.5 MG tablet Take 1 tablet (12.5 mg total) by mouth 2 (two) times daily with a meal. 180 tablet 3   Continuous Blood Gluc Sensor (FREESTYLE LIBRE 14 DAY SENSOR) MISC 1 Device by Does not apply route every 14 (fourteen) days. 6 each 3   diclofenac Sodium (VOLTAREN) 1 % GEL Rub a small grape sized dollop into knee up to 3 times daily as needed. 350 g 1   Evolocumab (REPATHA SURECLICK) 140 MG/ML  SOAJ Inject 140 mg into the skin every 14 (fourteen) days. 2 mL 11   ezetimibe (ZETIA) 10 MG tablet Take 1 tablet (10 mg total) by mouth daily. 90 tablet 3   FARXIGA 10 MG TABS tablet Take 1 tablet (10 mg total) by mouth daily. 90 tablet 3   glucose blood test strip 1 each by Other route 2 (two) times daily. And lancets 2/day 200 each 3   insulin glargine (LANTUS SOLOSTAR) 100 UNIT/ML Solostar Pen Inject 40 Units into the skin every morning. And pen needles 1/day 45 mL PRN   Insulin Pen Needle (PEN NEEDLES) 32G X 4 MM MISC 1 Package by Does not apply route 4 (four) times daily. 360 each 2   Lidocaine 4 % PTCH Apply 1 patch topically daily as needed (pain).     rosuvastatin (CRESTOR) 40 MG tablet Take 1 tablet (40 mg total) by mouth daily. 30 tablet 1   sacubitril-valsartan (ENTRESTO) 24-26 MG Take 1 tablet by mouth 2 (two) times daily. 60 tablet 6   sildenafil (VIAGRA) 100 MG tablet TAKE ONE TABLET BY MOUTH DAILY AS NEEDED FOR  ERECTILE DYSFUNCTION (Patient taking differently: Take 100 mg by mouth as needed for erectile dysfunction.) 30 tablet 2   apixaban (ELIQUIS) 5 MG TABS tablet Take 1 tablet (5 mg total) by mouth 2 (two) times daily. 60 tablet 2   Dulaglutide (TRULICITY) 1.5 MG/0.5ML SOPN Inject 1.5 mg into the skin once a week. 6 mL 3   Facility-Administered Medications Prior to Visit  Medication Dose Route Frequency Provider Last Rate Last Admin   sodium chloride flush (NS) 0.9 % injection 3 mL  3 mL Intravenous Q12H Meng, [place], [place]        No Known Allergies  ROS Review of Systems  Constitutional:  Negative for chills, diaphoresis, fatigue, fever and unexpected weight change.  HENT: Negative.    Eyes:  Negative for photophobia and visual disturbance.  Respiratory: Negative.    Cardiovascular: Negative.   Gastrointestinal: Negative.   Genitourinary:  Negative for difficulty urinating, frequency and urgency.  Musculoskeletal:  Positive for arthralgias.  Neurological:  Negative for speech difficulty, weakness and light-headedness.  Psychiatric/Behavioral: Negative.       Objective:    Physical Exam Vitals and nursing note reviewed.  Constitutional:      General: He is not in acute distress.    Appearance: Normal appearance. He is not ill-appearing, toxic-appearing or diaphoretic.  HENT:     Head: Normocephalic and atraumatic.     Right Ear: External ear normal.     Left Ear: External ear normal.     Mouth/Throat:     Mouth: Mucous membranes are moist.     Pharynx: Oropharynx is clear. No oropharyngeal exudate or posterior oropharyngeal erythema.  Eyes:     General: No scleral icterus.       Right eye: No discharge.        Left eye: No discharge.     Extraocular Movements: Extraocular movements intact.     Conjunctiva/sclera: Conjunctivae normal.     Pupils: Pupils are equal, round, and reactive to light.  Neck:     Vascular: No carotid bruit.  Cardiovascular:     Rate and Rhythm: Normal  rate and regular rhythm.  Pulmonary:     Effort: Pulmonary effort is normal.     Breath sounds: Normal breath sounds.  Abdominal:     General: Bowel sounds are normal.  Genitourinary:    Prostate: Not enlarged, not  tender and no nodules present.     Rectum: Guaiac result negative. No mass, tenderness, anal fissure, external hemorrhoid or internal hemorrhoid. Normal anal tone.  Musculoskeletal:     Cervical back: No rigidity or tenderness.  Lymphadenopathy:     Cervical: No cervical adenopathy.  Skin:    General: Skin is warm and dry.  Neurological:     Mental Status: He is alert and oriented to person, place, and time.  Psychiatric:        Mood and Affect: Mood normal.        Behavior: Behavior normal.    BP 116/70 (BP Location: Right Arm, Patient Position: Sitting, Cuff Size: Normal)   Pulse 86   Temp (!) 97.4 F (36.3 C) (Temporal)   Ht 5\' 10"  (1.778 m)   Wt 171 lb 3.2 oz (77.7 kg)   SpO2 99%   BMI 24.56 kg/m  Wt Readings from Last 3 Encounters:  03/31/21 175 lb 12.8 oz (79.7 kg)  03/29/21 174 lb 6.4 oz (79.1 kg)  03/08/21 171 lb 3.2 oz (77.7 kg)     Health Maintenance Due  Topic Date Due   OPHTHALMOLOGY EXAM  Never done    There are no preventive care reminders to display for this patient.  Lab Results  Component Value Date   TSH 1.54 10/08/2017   Lab Results  Component Value Date   WBC 9.1 03/08/2021   HGB 15.1 03/08/2021   HCT 45.2 03/08/2021   MCV 90.8 03/08/2021   PLT 383.0 03/08/2021   Lab Results  Component Value Date   NA 137 04/10/2021   K 5.1 04/10/2021   CO2 22 04/10/2021   GLUCOSE 93 04/10/2021   BUN 33 (H) 04/10/2021   CREATININE 1.51 (H) 04/10/2021   BILITOT 0.4 09/08/2020   ALKPHOS 56 09/08/2020   AST 16 09/08/2020   ALT 13 09/08/2020   PROT 6.3 09/08/2020   ALBUMIN 4.0 09/08/2020   CALCIUM 9.3 04/10/2021   ANIONGAP 9 10/18/2020   EGFR 53 (L) 04/10/2021   GFR 58.60 (L) 09/08/2020   Lab Results  Component Value Date   CHOL  82 (L) 02/27/2021   Lab Results  Component Value Date   HDL 42 02/27/2021   Lab Results  Component Value Date   LDLCALC 28 02/27/2021   Lab Results  Component Value Date   TRIG 45 02/27/2021   Lab Results  Component Value Date   CHOLHDL 2.0 02/27/2021   Lab Results  Component Value Date   HGBA1C 8.4 (A) 03/31/2021      Assessment & Plan:   Problem List Items Addressed This Visit       Other   Encounter for hepatitis C screening test for low risk patient   Relevant Orders   Hepatitis C antibody (Completed)   Chronic pain of both knees - Primary   Relevant Orders   DG Knee Complete 4 Views Left (Completed)   DG Knee Complete 4 Views Right (Completed)   Abnormal chest x-ray   Relevant Orders   CT CHEST LUNG CA SCREEN LOW DOSE W/O CM   Other Visit Diagnoses     Hydronephrosis, unspecified hydronephrosis type       Relevant Orders   CT Abdomen Pelvis Wo Contrast       No orders of the defined types were placed in this encounter.   Follow-up: Return in about 6 months (around 09/08/2021).  Information was given on health maintenance and disease prevention.  We will have  follow-up knee x-rays done today.  Rechecking PSA.  Up-to-date on health maintenance.  Continue Voltaren gel for knee pain.    Mliss Sax, MD

## 2021-03-09 ENCOUNTER — Other Ambulatory Visit: Payer: Self-pay | Admitting: Cardiology

## 2021-03-09 LAB — HEPATITIS C ANTIBODY
Hepatitis C Ab: NONREACTIVE
SIGNAL TO CUT-OFF: <0.02 (ref <1.00)

## 2021-03-09 NOTE — Telephone Encounter (Signed)
Prescription refill request for Eliquis received. ?Indication:CVA ?Last office visit:12/22 ?Scr:1.2 ?Age: 61 ?Weight:77.7 kg ? ?Prescription refilled ? ?

## 2021-03-10 ENCOUNTER — Ambulatory Visit (HOSPITAL_COMMUNITY): Payer: Managed Care, Other (non HMO) | Attending: Cardiology

## 2021-03-10 ENCOUNTER — Other Ambulatory Visit: Payer: Self-pay

## 2021-03-10 DIAGNOSIS — I255 Ischemic cardiomyopathy: Secondary | ICD-10-CM | POA: Diagnosis not present

## 2021-03-10 LAB — ECHOCARDIOGRAM COMPLETE
Area-P 1/2: 4.63 cm2
S' Lateral: 3.7 cm

## 2021-03-10 MED ORDER — PERFLUTREN LIPID MICROSPHERE
1.0000 mL | INTRAVENOUS | Status: AC | PRN
  Administered 2021-03-10: 2 mL via INTRAVENOUS

## 2021-03-13 ENCOUNTER — Other Ambulatory Visit: Payer: Self-pay | Admitting: *Deleted

## 2021-03-13 DIAGNOSIS — I255 Ischemic cardiomyopathy: Secondary | ICD-10-CM

## 2021-03-23 ENCOUNTER — Telehealth: Payer: Self-pay

## 2021-03-23 ENCOUNTER — Telehealth: Payer: Self-pay | Admitting: Family Medicine

## 2021-03-23 DIAGNOSIS — R9389 Abnormal findings on diagnostic imaging of other specified body structures: Secondary | ICD-10-CM

## 2021-03-23 NOTE — Telephone Encounter (Signed)
Referral sent message stating that Per 3rd Party CT for Lung Cancer Screening was denied. States pt didn't meet medical requirements. Please advise.  ?

## 2021-03-28 DIAGNOSIS — I255 Ischemic cardiomyopathy: Secondary | ICD-10-CM | POA: Insufficient documentation

## 2021-03-29 ENCOUNTER — Other Ambulatory Visit: Payer: Self-pay

## 2021-03-29 ENCOUNTER — Encounter: Payer: Self-pay | Admitting: Internal Medicine

## 2021-03-29 ENCOUNTER — Ambulatory Visit: Payer: Managed Care, Other (non HMO) | Admitting: Internal Medicine

## 2021-03-29 VITALS — BP 106/66 | HR 90 | Ht 70.0 in | Wt 174.4 lb

## 2021-03-29 DIAGNOSIS — I255 Ischemic cardiomyopathy: Secondary | ICD-10-CM | POA: Diagnosis not present

## 2021-03-29 DIAGNOSIS — I5022 Chronic systolic (congestive) heart failure: Secondary | ICD-10-CM

## 2021-03-29 MED ORDER — SPIRONOLACTONE 25 MG PO TABS: 12.5000 mg | ORAL_TABLET | Freq: Every day | ORAL | 3 refills | Status: DC

## 2021-03-29 NOTE — Patient Instructions (Addendum)
Medication Instructions:  ?Your physician has recommended you make the following change in your medication:  ? ? START taking spironolactone 25 mg- Take 1/2 tablet (12.5 mg) by mouth daily at bedtime. ? ? ?Labwork: ?You will get lab work today:BMP ? ?You will get lab work in 2 weeks:  April 10-anytime after 7:30 am-you do not need to be fasting. ? ?Testing/Procedures: ?You will be scheduled for a cardiac MRI. ? ?Follow-Up: ?SEE INSTRUCTION LETTER ? ?Any Other Special Instructions Will Be Listed Below (If Applicable). ? ?If you need a refill on your cardiac medications before your next appointment, please call your pharmacy.  ? ?CARDIAC MRI INSTRUCTIONS: ? ?You are scheduled for Cardiac MRI on ______________. Please arrive at the [facility] main entrance of [facility] at _______________ (30-45 minutes prior to test start time). ? ?  ?[facility] [address]  ?[address]  ?[place], [place] [address]  ?[phone]  ? ?Proceed to the [facility] Radiology Department (First Floor).  ??  ?Magnetic resonance imaging (MRI) is a painless test that produces images of the inside of the body without using X-rays. During an MRI, strong magnets and radio waves work together in a Research officer, political party to form detailed images. MRI images may provide more details about a medical condition than X-rays, CT scans, and ultrasounds can provide.  ? ?You may be given earphones to listen for instructions.  ? ?You may eat a light breakfast and take medications as ordered. ? ?If a contrast material will be used, an IV will be inserted into one of your veins. Contrast material will be injected into your IV.  ? ?You will be asked to remove all metal, including: Watch, jewelry, and other metal objects including hearing aids, hair pieces and dentures. (Braces and fillings normally are not a problem.)  ?If contrast material was used:  ?It will leave your body through your urine within a day. You may be told to drink plenty of fluids to  help flush the contrast material out of your system.  ?TEST WILL TAKE APPROXIMATELY 1 HOUR  ?PLEASE NOTIFY SCHEDULING AT LEAST 24 HOURS IN ADVANCE IF YOU ARE UNABLE TO KEEP YOUR APPOINTMENT.   ? ? ? ?

## 2021-03-29 NOTE — Progress Notes (Signed)
? ? ? ? ?ELECTROPHYSIOLOGY CONSULT NOTE  ?Patient ID: John [name], MRN: [id number], DOB/AGE: 08/24/1960 61 y.o. ?Admit date: (Not on file) ?Date of Consult: 03/29/2021 ? ?Primary Physician: Libby Maw, MD ?Primary Cardiologist: BCr ?  ?  ?John [name] is a 61 y.o. male who is being seen today for the evaluation of ICD at the request of BCr.  ? ? ?HPI ?John [name] is a 61 y.o. male referred for consideration of an ICD. ? ?He has a history of diabetes and a recent stroke (5/22) involving his left upper extremity. ?Echocardiogram showed an atypical wall motion abnormality with modestly reduced left ventricular function without evidence of thrombus.  There was however apical "swirling "and it was elected to start him on Eliquis. ? ?He has been started on guideline directed therapy including Lisabeth Register and carvedilol 12.5.  With consideration of the addition of spironolactone ? ?The patient denies chest pain, shortness of breath, nocturnal dyspnea, orthopnea or peripheral edema.  There have been no palpitations, lightheadedness or syncope.  Does complain of fatigue ? ?On Anticoagulant for thromboembolic risk reduction.  No bleeding issues.    ? ? ?DATE TEST EF   ?6/22 Echo   40-45 % ? [place] thrombus  ?8/22 LHC  LADp-99%; OM1-80%; PDA1-80; PDA 2-90  ?3/23 Echo  25-30% LVH-moderate (16/14 mm)  ? ?Date Cr K Hgb  ?1/23 1.22 5.0 15.1  ?      ? ? ? ? ?Past Medical History:  ?Diagnosis Date  ? Arthritis   ? Diabetes mellitus without complication ([place])   ? Hyperlipidemia   ? Hypertension   ? Stroke [facility]) 05/2020  ?   ? ?Surgical History:  ?Past Surgical History:  ?Procedure Laterality Date  ? LEFT HEART CATH AND CORONARY ANGIOGRAPHY N/A 08/18/2020  ? Procedure: LEFT HEART CATH AND CORONARY ANGIOGRAPHY;  Surgeon: Martinique, Peter M, MD;  Location: [place] CV LAB;  Service: Cardiovascular;  Laterality: N/A;  ? TONSILLECTOMY    ?  ? ?Home Meds: ?Current Meds  ?Medication Sig  ? acetaminophen  (TYLENOL) 500 MG tablet Take 500 mg by mouth every 6 (six) hours as needed for moderate pain or headache.  ? apixaban (ELIQUIS) 5 MG TABS tablet TAKE ONE TABLET BY MOUTH TWICE A DAY  ? Aspirin-Caffeine (BC FAST PAIN RELIEF PO) Take 1 packet by mouth daily as needed (pain).  ? carvedilol (COREG) 12.5 MG tablet Take 1 tablet (12.5 mg total) by mouth 2 (two) times daily with a meal.  ? Continuous Blood Gluc Sensor (FREESTYLE LIBRE 14 DAY SENSOR) MISC 1 Device by Does not apply route every 14 (fourteen) days.  ? Dulaglutide (TRULICITY) 1.5 TI/1.4ER SOPN Inject 1.5 mg into the skin once a week.  ? Evolocumab (REPATHA SURECLICK) 154 MG/ML SOAJ Inject 140 mg into the skin every 14 (fourteen) days.  ? ezetimibe (ZETIA) 10 MG tablet Take 1 tablet (10 mg total) by mouth daily.  ? FARXIGA 10 MG TABS tablet Take 1 tablet (10 mg total) by mouth daily.  ? glucose blood test strip 1 each by Other route 2 (two) times daily. And lancets 2/day  ? insulin glargine (LANTUS SOLOSTAR) 100 UNIT/ML Solostar Pen Inject 40 Units into the skin every morning. And pen needles 1/day  ? Insulin Pen Needle (PEN NEEDLES) 32G X 4 MM MISC 1 Package by Does not apply route 4 (four) times daily.  ? sacubitril-valsartan (ENTRESTO) 24-26 MG Take 1 tablet by mouth 2 (two) times  daily.  ? sildenafil (VIAGRA) 100 MG tablet TAKE ONE TABLET BY MOUTH DAILY AS NEEDED FOR ERECTILE DYSFUNCTION (Patient taking differently: Take 100 mg by mouth as needed for erectile dysfunction.)  ? ?Current Facility-Administered Medications for the 03/29/21 encounter (Office Visit) with Deboraha Sprang, MD  ?Medication  ? sodium chloride flush (NS) 0.9 % injection 3 mL  ? ? ?Allergies: No Known Allergies ? ?Social History  ? ?Socioeconomic History  ? Marital status: Married  ?  Spouse name: [name]  ? Number of children: 4  ? Years of education: Not on file  ? Highest education level: Not on file  ?Occupational History  ? Not on file  ?Tobacco Use  ? Smoking status: Former  ?  Smokeless tobacco: Never  ? Tobacco comments:  ?  Quit in 2017   ?Vaping Use  ? Vaping Use: Never used  ?Substance and Sexual Activity  ? Alcohol use: Yes  ?  Comment: Socially  ? Drug use: Never  ? Sexual activity: Yes  ?Other Topics Concern  ? Not on file  ?Social History Narrative  ? Lives with wife  ? ?Social Determinants of Health  ? ?Financial Resource Strain: Not on file  ?Food Insecurity: Not on file  ?Transportation Needs: Not on file  ?Physical Activity: Not on file  ?Stress: Not on file  ?Social Connections: Not on file  ?Intimate Partner Violence: Not on file  ?  ? ?Family History  ?Problem Relation Age of Onset  ? Diabetes Mother   ? CAD Mother   ? Heart attack Mother   ?     x 2  ? COPD Father   ? Colon cancer Neg Hx   ? Esophageal cancer Neg Hx   ? Rectal cancer Neg Hx   ? Stomach cancer Neg Hx   ?Multiple uncles on his mother side died early between the ages of 69 and 42 often suddenly ? ?ROS:  Please see the history of present illness.     All other systems reviewed and negative.  ? ? ?Physical Exam: ?Blood pressure 106/66, pulse 90, height '5\' 10"'$  (1.778 m), weight 174 lb 6.4 oz (79.1 kg), SpO2 98 %. ?General: Well developed, well nourished male in no acute distress. ?Head: Normocephalic, atraumatic, sclera non-icteric, no xanthomas, nares are without discharge. ?EENT: normal  ?Lymph Nodes:  none ?Neck: Negative for carotid bruits. JVD not elevated. ?Back:without scoliosis kyphosis ?Lungs: Clear bilaterally to auscultation without wheezes, rales, or rhonchi. Breathing is unlabored. ?Heart: RRR with S1 S2. No  murmur . No rubs, or gallops appreciated. ?Abdomen: Soft, non-tender, non-distended with normoactive bowel sounds. No hepatomegaly. No rebound/guarding. No obvious abdominal masses. ?Msk:  Strength and tone appear normal for age. ?Extremities: No clubbing or cyanosis. No edema.  Distal pedal pulses are 2+ and equal bilaterally. ?Skin: Warm and Dry ?Neuro: Alert and oriented X 3. CN III-XII  intact Grossly normal sensory and motor function . ?Psych:  Responds to questions appropriately with a normal affect. ?  ?  ?  ? ?EKG: Sinus at 90 ?Interval 17/09/40 ?Q waves V1 and V2 consistent with prior anteroseptal MI ? ? ?Assessment and Plan:  ?Ischemic cardiomyopathy with three-vessel disease but a small LAD felt not to be amenable to revascularization with a likely nonviable wall ? ?Left ventricular hypertrophy ? ?Elevated potassium ? ?Hypertension  ? ?Hyperlipidemia on Repatha ? ? ?The patient has persistent left ventricular dysfunction in the setting of significant coronary disease and this despite guideline directed therapy.  He is appropriately considered for an ICD for risk reduction of sudden cardiac death which would estimate probably in the range of 1.5-2 %/year.  Further overall mortality risk reduction may be accomplished by adding spironolactone; his potassium may be an issue, we will check it today and then begin at 12.5 mg with a repeat BMET  in 2 weeks. ? ?He also noted to have left ventricular hypertrophy on echo with some degree of asymmetry and the septal thickness was measured 16 mm.  He has a very strong family history of sudden death; important to exclude hypertrophic cardiomyopathy for risk assessment of his [facility].  We will undertake cMRI.  I have reviewed HCM with him and the potential concerns pindolol cMRI ? ?Thereafter, we will undertake ICD implantation.  Discussed transvenous versus subcutaneous device, including batteries, inappropriate shocks, ATP options, and he is elected to proceed with transvenous device. ? ? ? ? ? ?Virl Axe  ?

## 2021-03-30 LAB — BASIC METABOLIC PANEL
BUN/Creatinine Ratio: 19 (ref 9–20)
BUN: 25 mg/dL — ABNORMAL HIGH (ref 6–24)
CO2: 25 mmol/L (ref 20–29)
Calcium: 8.9 mg/dL (ref 8.7–10.2)
Chloride: 103 mmol/L (ref 96–106)
Creatinine, Ser: 1.3 mg/dL — ABNORMAL HIGH (ref 0.76–1.27)
Glucose: 311 mg/dL — ABNORMAL HIGH (ref 70–99)
Potassium: 5 mmol/L (ref 3.5–5.2)
Sodium: 142 mmol/L (ref 134–144)
eGFR: 63 mL/min/{1.73_m2} (ref >59)

## 2021-03-31 ENCOUNTER — Ambulatory Visit: Payer: Managed Care, Other (non HMO) | Admitting: Endocrinology

## 2021-03-31 ENCOUNTER — Encounter: Payer: Self-pay | Admitting: Endocrinology

## 2021-03-31 VITALS — BP 100/72 | HR 72 | Ht 70.0 in | Wt 175.8 lb

## 2021-03-31 DIAGNOSIS — E1139 Type 2 diabetes mellitus with other diabetic ophthalmic complication: Secondary | ICD-10-CM | POA: Diagnosis not present

## 2021-03-31 DIAGNOSIS — Z794 Long term (current) use of insulin: Secondary | ICD-10-CM

## 2021-03-31 LAB — POCT GLYCOSYLATED HEMOGLOBIN (HGB A1C): Hemoglobin A1C: 8.4 % — AB (ref 4.0–5.6)

## 2021-03-31 MED ORDER — TRULICITY 3 MG/0.5ML ~~LOC~~ SOAJ: 3.0000 mg | SUBCUTANEOUS | 3 refills | Status: DC

## 2021-03-31 NOTE — Progress Notes (Signed)
? ?Subjective:  ? ? Patient ID: John [name], male    DOB: 1960-12-29, 61 y.o.   MRN: [id number] ? ?HPI ?Pt returns for f/u of diabetes mellitus:   ?DM type: Insulin-requiring type 2.   ?Dx'ed: 2004 ?Complications: DR, CAD, CRI, and CVA ?Therapy: insulin since 2563, Trulicity, and 2 oral meds.   ?DKA: never ?Severe hypoglycemia: never ?Pancreatitis: never.   ?Pancreatic imaging: never.   ?SDOH: he declines more frequent f/u here, due to high copay; he is not using continuous glucose monitor, due to cost.   ?Other: He works 3rd shift, in grocery distribution; he takes multiple daily injections, but basal insulin is emphasized.   ?Interval history: no cbg record, but states cbg's vary from 75-270.  There is no trend throughout the day.  He takes meds as rx'ed.   ?Past Medical History:  ?Diagnosis Date  ? Arthritis   ? Diabetes mellitus without complication ([place])   ? Hyperlipidemia   ? Hypertension   ? Stroke [facility]) 05/2020  ? ? ?Past Surgical History:  ?Procedure Laterality Date  ? LEFT HEART CATH AND CORONARY ANGIOGRAPHY N/A 08/18/2020  ? Procedure: LEFT HEART CATH AND CORONARY ANGIOGRAPHY;  Surgeon: Martinique, Peter M, MD;  Location: [facility];  Service: Cardiovascular;  Laterality: N/A;  ? TONSILLECTOMY    ? ? ?Social History  ? ?Socioeconomic History  ? Marital status: Married  ?  Spouse name: [name]  ? Number of children: 4  ? Years of education: Not on file  ? Highest education level: Not on file  ?Occupational History  ? Not on file  ?Tobacco Use  ? Smoking status: Former  ? Smokeless tobacco: Never  ? Tobacco comments:  ?  Quit in 2017   ?Vaping Use  ? Vaping Use: Never used  ?Substance and Sexual Activity  ? Alcohol use: Yes  ?  Comment: Socially  ? Drug use: Never  ? Sexual activity: Yes  ?Other Topics Concern  ? Not on file  ?Social History Narrative  ? Lives with wife  ? ?Social Determinants of Health  ? ?Financial Resource Strain: Not on file  ?Food Insecurity: Not on file  ?Transportation Needs:  Not on file  ?Physical Activity: Not on file  ?Stress: Not on file  ?Social Connections: Not on file  ?Intimate Partner Violence: Not on file  ? ? ?Current Outpatient Medications on File Prior to Visit  ?Medication Sig Dispense Refill  ? acetaminophen (TYLENOL) 500 MG tablet Take 500 mg by mouth every 6 (six) hours as needed for moderate pain or headache.    ? apixaban (ELIQUIS) 5 MG TABS tablet TAKE ONE TABLET BY MOUTH TWICE A DAY 180 tablet 1  ? Aspirin-Caffeine (BC FAST PAIN RELIEF PO) Take 1 packet by mouth daily as needed (pain).    ? carbamide peroxide (DEBROX) 6.5 % OTIC solution Place 5 drops into both ears 2 (two) times daily. 15 mL 2  ? carvedilol (COREG) 12.5 MG tablet Take 1 tablet (12.5 mg total) by mouth 2 (two) times daily with a meal. 180 tablet 3  ? Continuous Blood Gluc Sensor (FREESTYLE LIBRE 14 DAY SENSOR) MISC 1 Device by Does not apply route every 14 (fourteen) days. 6 each 3  ? diclofenac Sodium (VOLTAREN) 1 % GEL Rub a small grape sized dollop into knee up to 3 times daily as needed. 350 g 1  ? Evolocumab (REPATHA SURECLICK) 893 MG/ML SOAJ Inject 140 mg into the skin every 14 (fourteen) days.  2 mL 11  ? ezetimibe (ZETIA) 10 MG tablet Take 1 tablet (10 mg total) by mouth daily. 90 tablet 3  ? FARXIGA 10 MG TABS tablet Take 1 tablet (10 mg total) by mouth daily. 90 tablet 3  ? glucose blood test strip 1 each by Other route 2 (two) times daily. And lancets 2/day 200 each 3  ? insulin glargine (LANTUS SOLOSTAR) 100 UNIT/ML Solostar Pen Inject 40 Units into the skin every morning. And pen needles 1/day 45 mL PRN  ? Insulin Pen Needle (PEN NEEDLES) 32G X 4 MM MISC 1 Package by Does not apply route 4 (four) times daily. 360 each 2  ? Lidocaine 4 % PTCH Apply 1 patch topically daily as needed (pain).    ? rosuvastatin (CRESTOR) 40 MG tablet Take 1 tablet (40 mg total) by mouth daily. 30 tablet 1  ? sacubitril-valsartan (ENTRESTO) 24-26 MG Take 1 tablet by mouth 2 (two) times daily. 60 tablet 6  ?  sildenafil (VIAGRA) 100 MG tablet TAKE ONE TABLET BY MOUTH DAILY AS NEEDED FOR ERECTILE DYSFUNCTION (Patient taking differently: Take 100 mg by mouth as needed for erectile dysfunction.) 30 tablet 2  ? spironolactone (ALDACTONE) 25 MG tablet Take 0.5 tablets (12.5 mg total) by mouth daily. 45 tablet 3  ? ?Current Facility-Administered Medications on File Prior to Visit  ?Medication Dose Route Frequency Provider Last Rate Last Admin  ? sodium chloride flush (NS) 0.9 % injection 3 mL  3 mL Intravenous Q12H Almyra Deforest, PA      ? ? ?No Known Allergies ? ?Family History  ?Problem Relation Age of Onset  ? Diabetes Mother   ? CAD Mother   ? Heart attack Mother   ?     x 2  ? COPD Father   ? Colon cancer Neg Hx   ? Esophageal cancer Neg Hx   ? Rectal cancer Neg Hx   ? Stomach cancer Neg Hx   ? ? ?BP 100/72 (BP Location: Left Arm, Patient Position: Sitting, Cuff Size: Normal)   Pulse 72   Ht '5\' 10"'$  (1.778 m)   Wt 175 lb 12.8 oz (79.7 kg)   SpO2 97%   BMI 25.22 kg/m?  ? ? ?Review of Systems ?He denies hypoglycemia/N/HB ?   ?Objective:  ? Physical Exam ?VITAL SIGNS:  See vs page ?GENERAL: no distress ? ?Lab Results  ?Component Value Date  ? CREATININE 1.30 (H) 03/29/2021  ? BUN 25 (H) 03/29/2021  ? NA 142 03/29/2021  ? K 5.0 03/29/2021  ? CL 103 03/29/2021  ? CO2 25 03/29/2021  ? ? ?Lab Results  ?Component Value Date  ? HGBA1C 8.4 (A) 03/31/2021  ? ?   ?Assessment & Plan:  ?Insulin-requiring type 2 DM: uncontrolled. ? ?Patient Instructions  ?I have sent a prescription to your pharmacy, to increase the Trulicity, and:   ?Please continue the same other medications.   ?check your blood sugar twice a day.  vary the time of day when you check, between before the 3 meals, and at bedtime.  also check if you have symptoms of your blood sugar being too high or too low.  please keep a record of the readings and bring it to your next appointment here (or you can bring the meter itself).  You can write it on any piece of paper.   please call us sooner if your blood sugar goes below 70, or if you have a lot of readings over 200.    ?  Please come back for a follow-up appointment in 2-3 months.    ? ? ? ?

## 2021-03-31 NOTE — Patient Instructions (Addendum)
I have sent a prescription to your pharmacy, to increase the Trulicity, and:   ?Please continue the same other medications.   ?check your blood sugar twice a day.  vary the time of day when you check, between before the 3 meals, and at bedtime.  also check if you have symptoms of your blood sugar being too high or too low.  please keep a record of the readings and bring it to your next appointment here (or you can bring the meter itself).  You can write it on any piece of paper.  please call us sooner if your blood sugar goes below 70, or if you have a lot of readings over 200.    ?Please come back for a follow-up appointment in 2-3 months.    ?

## 2021-04-10 ENCOUNTER — Other Ambulatory Visit: Payer: Managed Care, Other (non HMO) | Admitting: *Deleted

## 2021-04-10 DIAGNOSIS — I5022 Chronic systolic (congestive) heart failure: Secondary | ICD-10-CM

## 2021-04-10 DIAGNOSIS — I255 Ischemic cardiomyopathy: Secondary | ICD-10-CM

## 2021-04-10 LAB — BASIC METABOLIC PANEL
BUN/Creatinine Ratio: 22 — ABNORMAL HIGH (ref 9–20)
BUN: 33 mg/dL — ABNORMAL HIGH (ref 6–24)
CO2: 22 mmol/L (ref 20–29)
Calcium: 9.3 mg/dL (ref 8.7–10.2)
Chloride: 102 mmol/L (ref 96–106)
Creatinine, Ser: 1.51 mg/dL — ABNORMAL HIGH (ref 0.76–1.27)
Glucose: 93 mg/dL (ref 70–99)
Potassium: 5.1 mmol/L (ref 3.5–5.2)
Sodium: 137 mmol/L (ref 134–144)
eGFR: 53 mL/min/{1.73_m2} — ABNORMAL LOW (ref >59)

## 2021-04-17 ENCOUNTER — Encounter (HOSPITAL_BASED_OUTPATIENT_CLINIC_OR_DEPARTMENT_OTHER): Payer: Self-pay

## 2021-04-17 ENCOUNTER — Ambulatory Visit (HOSPITAL_BASED_OUTPATIENT_CLINIC_OR_DEPARTMENT_OTHER)
Admission: RE | Admit: 2021-04-17 | Discharge: 2021-04-17 | Disposition: A | Discharge status: Home / Self Care | Payer: Managed Care, Other (non HMO) | Source: Ambulatory Visit | Attending: Family Medicine | Admitting: Family Medicine

## 2021-04-17 DIAGNOSIS — R9389 Abnormal findings on diagnostic imaging of other specified body structures: Secondary | ICD-10-CM | POA: Insufficient documentation

## 2021-04-18 ENCOUNTER — Telehealth: Payer: Self-pay

## 2021-04-18 NOTE — Addendum Note (Signed)
Addended by: Jon Billings on: 04/18/2021 10:25 AM ? ? Modules accepted: Orders ? ?

## 2021-04-18 NOTE — Telephone Encounter (Signed)
Please advise message below  °

## 2021-04-18 NOTE — Telephone Encounter (Signed)
Pt called saying someone called him but didn't leave a msg.  ?

## 2021-04-18 NOTE — Telephone Encounter (Signed)
[facility] called with report on CT chest that was done.  Radiologist wanted to make sure we got it and he was evaluated for the Moderate right hydronephrosis. This warrants further workup. ?Dedicated abdomen/pelvis CT recommended to further evaluate.   ? ?Please review and advise.  ?Thanks. Dm/cma ? ?

## 2021-04-19 NOTE — Telephone Encounter (Signed)
Patient aware of message below awaiting call to schedule CT appointment.  ?

## 2021-04-20 NOTE — Progress Notes (Signed)
? ? ? ? ?HPI: Follow-up cardiomyopathy-patient admitted May 2022 with acute CVA.  Echocardiogram June 2022 showed echogenic region in the LV lateral apex which could possibly be mural thrombus versus prominent trabeculations in the setting of wall motion abnormalities.  Ejection fraction 40 to 45%.  There was grade 1 diastolic dysfunction.  Given recent CVA and swirling at the LV apex apixaban was added.  Cardiac catheterization August 2022 showed 99% proximal LAD, 80% first marginal, 80% PDA 1 and 90% PDA 2.  The LAD was felt to be diffusely small.  It was felt the OM and PDA could potentially be causing ischemia but patient asymptomatic.  I did review the films with Dr. Martinique and we felt that his LAD was small and anterior wall likely nonviable.  We felt medical therapy was best option.  If he developed symptoms in the future could consider PCI of obtuse marginal and possibly PDA.  Echocardiogram repeated March 2023 and showed ejection fraction 25 to 30%, moderate left ventricular hypertrophy, grade 1 diastolic dysfunction.  Patient seen by Dr. Caryl Comes and cardiac MRI scheduled.  Also scheduled to have ICD placed in June.  Since last seen the patient denies any dyspnea on exertion, orthopnea, PND, pedal edema, palpitations, syncope or chest pain. ? ? ?Current Outpatient Medications  ?Medication Sig Dispense Refill  ? acetaminophen (TYLENOL) 500 MG tablet Take 500 mg by mouth every 6 (six) hours as needed for moderate pain or headache.    ? apixaban (ELIQUIS) 5 MG TABS tablet TAKE ONE TABLET BY MOUTH TWICE A DAY 180 tablet 1  ? Aspirin-Caffeine (BC FAST PAIN RELIEF PO) Take 1 packet by mouth daily as needed (pain).    ? carbamide peroxide (DEBROX) 6.5 % OTIC solution Place 5 drops into both ears 2 (two) times daily. 15 mL 2  ? carvedilol (COREG) 12.5 MG tablet Take 1 tablet (12.5 mg total) by mouth 2 (two) times daily with a meal. 180 tablet 3  ? Continuous Blood Gluc Sensor (FREESTYLE LIBRE 14 DAY SENSOR) MISC 1  Device by Does not apply route every 14 (fourteen) days. 6 each 3  ? diclofenac Sodium (VOLTAREN) 1 % GEL Rub a small grape sized dollop into knee up to 3 times daily as needed. 350 g 1  ? Dulaglutide (TRULICITY) 3 RC/7.8LF SOPN Inject 3 mg as directed once a week. 6 mL 3  ? Evolocumab (REPATHA SURECLICK) 810 MG/ML SOAJ Inject 140 mg into the skin every 14 (fourteen) days. 2 mL 11  ? ezetimibe (ZETIA) 10 MG tablet Take 1 tablet (10 mg total) by mouth daily. 90 tablet 3  ? FARXIGA 10 MG TABS tablet Take 1 tablet (10 mg total) by mouth daily. 90 tablet 3  ? glucose blood test strip 1 each by Other route 2 (two) times daily. And lancets 2/day 200 each 3  ? insulin glargine (LANTUS SOLOSTAR) 100 UNIT/ML Solostar Pen Inject 40 Units into the skin every morning. And pen needles 1/day 45 mL PRN  ? Insulin Pen Needle (PEN NEEDLES) 32G X 4 MM MISC 1 Package by Does not apply route 4 (four) times daily. 360 each 2  ? Lidocaine 4 % PTCH Apply 1 patch topically daily as needed (pain).    ? rosuvastatin (CRESTOR) 40 MG tablet Take 1 tablet (40 mg total) by mouth daily. 30 tablet 1  ? sacubitril-valsartan (ENTRESTO) 24-26 MG Take 1 tablet by mouth 2 (two) times daily. 60 tablet 6  ? sildenafil (VIAGRA) 100 MG tablet TAKE  ONE TABLET BY MOUTH DAILY AS NEEDED FOR ERECTILE DYSFUNCTION (Patient taking differently: Take 100 mg by mouth as needed for erectile dysfunction.) 30 tablet 2  ? spironolactone (ALDACTONE) 25 MG tablet Take 0.5 tablets (12.5 mg total) by mouth daily. 45 tablet 3  ? ?Current Facility-Administered Medications  ?Medication Dose Route Frequency Provider Last Rate Last Admin  ? sodium chloride flush (NS) 0.9 % injection 3 mL  3 mL Intravenous Q12H Almyra Deforest, PA      ? ? ? ?Past Medical History:  ?Diagnosis Date  ? Arthritis   ? Diabetes mellitus without complication ([place])   ? Hyperlipidemia   ? Hypertension   ? Stroke [facility]) 05/2020  ? ? ?Past Surgical History:  ?Procedure Laterality Date  ? LEFT HEART CATH AND  CORONARY ANGIOGRAPHY N/A 08/18/2020  ? Procedure: LEFT HEART CATH AND CORONARY ANGIOGRAPHY;  Surgeon: Martinique, Peter M, MD;  Location: [facility];  Service: Cardiovascular;  Laterality: N/A;  ? TONSILLECTOMY    ? ? ?Social History  ? ?Socioeconomic History  ? Marital status: Married  ?  Spouse name: [name]  ? Number of children: 4  ? Years of education: Not on file  ? Highest education level: Not on file  ?Occupational History  ? Not on file  ?Tobacco Use  ? Smoking status: Former  ? Smokeless tobacco: Never  ? Tobacco comments:  ?  Quit in 2017   ?Vaping Use  ? Vaping Use: Never used  ?Substance and Sexual Activity  ? Alcohol use: Yes  ?  Comment: Socially  ? Drug use: Never  ? Sexual activity: Yes  ?Other Topics Concern  ? Not on file  ?Social History Narrative  ? Lives with wife  ? ?Social Determinants of Health  ? ?Financial Resource Strain: Not on file  ?Food Insecurity: Not on file  ?Transportation Needs: Not on file  ?Physical Activity: Not on file  ?Stress: Not on file  ?Social Connections: Not on file  ?Intimate Partner Violence: Not on file  ? ? ?Family History  ?Problem Relation Age of Onset  ? Diabetes Mother   ? CAD Mother   ? Heart attack Mother   ?     x 2  ? COPD Father   ? Colon cancer Neg Hx   ? Esophageal cancer Neg Hx   ? Rectal cancer Neg Hx   ? Stomach cancer Neg Hx   ? ? ?ROS: no fevers or chills, productive cough, hemoptysis, dysphasia, odynophagia, melena, hematochezia, dysuria, hematuria, rash, seizure activity, orthopnea, PND, pedal edema, claudication. Remaining systems are negative. ? ?Physical Exam: ?Well-developed well-nourished in no acute distress.  ?Skin is warm and dry.  ?HEENT is normal.  ?Neck is supple.  ?Chest is clear to auscultation with normal expansion.  ?Cardiovascular exam is regular rate and rhythm.  ?Abdominal exam nontender or distended. No masses palpated. ?Extremities show no edema. ?neuro grossly intact ? ? ?A/P ? ?1 coronary artery disease-plan to continue  medical therapy as outlined above given patient is asymptomatic.  Continue aspirin and statin.  PCI of the obtuse marginal in PDA could be considered if he develops chest pain in the future. ? ?2 ischemic cardiomyopathy-continue carvedilol and Entresto.  Continue Farxiga and spironolactone.  Patient had cardiac MRI with results pending yesterday.  If ejection fraction less than 35% will need ICD which is tentatively scheduled for June. ? ?3 hypertension-blood pressure controlled.  Continue present medical regimen and follow. ? ?4 hyperlipidemia-continue statin. ? ?5 history  of CVA-previous echocardiogram demonstrated swirling at the left ventricular apex with increased risk of thrombus formation.  We will continue apixaban. ? ?6 moderate right hydronephrosis and pulmonary nodule noted on recent CT April 17.  I have asked him to follow-up with Dr. Ethelene Hal for that issue. ? ?Kirk Ruths, MD ? ? ? ?

## 2021-04-25 ENCOUNTER — Telehealth: Payer: Self-pay

## 2021-04-25 ENCOUNTER — Telehealth (HOSPITAL_COMMUNITY): Payer: Self-pay | Admitting: *Deleted

## 2021-04-25 DIAGNOSIS — I5022 Chronic systolic (congestive) heart failure: Secondary | ICD-10-CM

## 2021-04-25 DIAGNOSIS — I10 Essential (primary) hypertension: Secondary | ICD-10-CM

## 2021-04-25 DIAGNOSIS — N289 Disorder of kidney and ureter, unspecified: Secondary | ICD-10-CM

## 2021-04-25 NOTE — Telephone Encounter (Signed)
Reaching out to patient to offer assistance regarding upcoming cardiac imaging study; pt verbalizes understanding of appt date/time, parking situation and where to check in, and verified current allergies; name and call back number provided for further questions should they arise ? ?Gordy Clement RN Navigator Cardiac Imaging ?[facility] ?[phone] office ?[phone] cell ? ?Patient denies metal, claustrophobia or difficult IV. ?

## 2021-04-25 NOTE — Telephone Encounter (Signed)
-----   Message from Deboraha Sprang, MD sent at 04/15/2021  3:52 PM EDT ----- ?Please Inform Patient ? ?Labs are normal x interval change in kidney function ?Please repeat in 2 weeks ? ?Thanks  ?

## 2021-04-25 NOTE — Telephone Encounter (Signed)
Spoke with pt and advised of lab results and recommendations per Dr Caryl Comes.  Appointment scheduled for repeat BMET 05/05/2021.  Pt advised he may come anytime between 8am and 430pm and does not need to be fasting.  Pt verbalizes understanding and agrees with current plan. ?

## 2021-04-26 ENCOUNTER — Ambulatory Visit (HOSPITAL_COMMUNITY)
Admission: RE | Admit: 2021-04-26 | Discharge: 2021-04-26 | Disposition: A | Discharge status: Home / Self Care | Payer: Managed Care, Other (non HMO) | Source: Ambulatory Visit | Attending: Internal Medicine | Admitting: Internal Medicine

## 2021-04-26 DIAGNOSIS — I5022 Chronic systolic (congestive) heart failure: Secondary | ICD-10-CM | POA: Insufficient documentation

## 2021-04-26 DIAGNOSIS — I255 Ischemic cardiomyopathy: Secondary | ICD-10-CM | POA: Diagnosis present

## 2021-04-26 IMAGING — MR MR CARD MORPHOLOGY WO/W CM
45 of 48 series · 45 of 48 positions shown · IV contrast (Contrast agent)
Comparison: none

CLINICAL DATA: Assess for hypertrophic cardiomyopathy

EXAM:
CARDIAC MRI
TECHNIQUE: The patient was scanned on a 1.5 Tesla GE magnet. A dedicated
cardiac coil was used. Functional imaging was done using Fiesta
sequences. [DATE], and 4 chamber views were done to assess for RWMA's.
Modified MARK rule using a short axis stack was used to
calculate an ejection fraction on a dedicated work station using
Circle software. The patient received 8 cc of Gadavist. After 10
minutes inversion recovery sequences were used to assess for
infiltration and scar tissue.

[Series 4: [id number]_bh · axial · 8.0mm · 1.41mm/px · 1 of 16 slices shown]
[im 1/16]
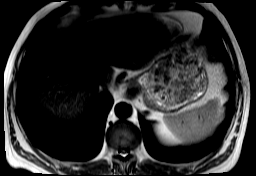

[Series 8: bSSFP · oblique · 8.0mm · 1.61mm/px · 1 of 25 slices shown (1 of 23)]
[im 1/25]
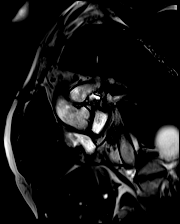

[Series 9: bSSFP · oblique · 8.0mm · 1.61mm/px · 1 of 25 slices shown (2 of 23)]
[im 1/25]
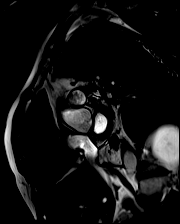

[Series 10: bSSFP · oblique · 8.0mm · 1.61mm/px · 1 of 25 slices shown (3 of 23)]
[im 1/25]
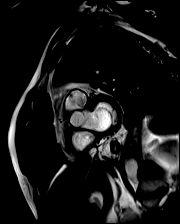

[Series 11: bSSFP · oblique · 8.0mm · 1.61mm/px · 1 of 25 slices shown (4 of 23)]
[im 1/25]
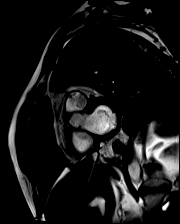

[Series 12: bSSFP · oblique · 8.0mm · 1.61mm/px · 1 of 25 slices shown (5 of 23)]
[im 1/25]
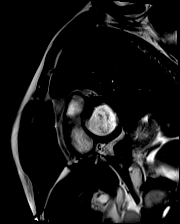

[Series 13: bSSFP · oblique · 8.0mm · 1.61mm/px · 1 of 25 slices shown (6 of 23)]
[im 1/25]
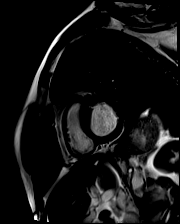

[Series 14: bSSFP · oblique · 8.0mm · 1.61mm/px · 1 of 25 slices shown (7 of 23)]
[im 1/25]
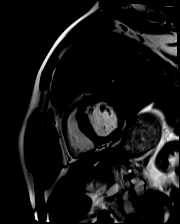

[Series 15: bSSFP · oblique · 8.0mm · 1.61mm/px · 1 of 25 slices shown (8 of 23)]
[im 1/25]
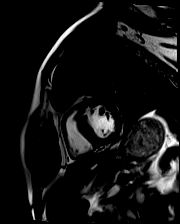

[Series 16: bSSFP · oblique · 8.0mm · 1.61mm/px · 1 of 25 slices shown (9 of 23)]
[im 1/25]
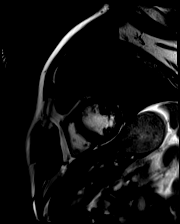

[Series 17: bSSFP · oblique · 8.0mm · 1.61mm/px · 1 of 25 slices shown (10 of 23)]
[im 1/25]
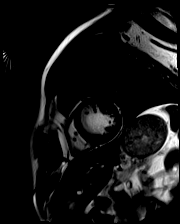

[Series 18: bSSFP · oblique · 8.0mm · 1.61mm/px · 1 of 25 slices shown (11 of 23)]
[im 1/25]
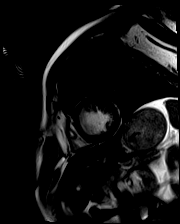

[Series 19: bSSFP · oblique · 8.0mm · 1.61mm/px · 1 of 25 slices shown (12 of 23)]
[im 1/25]
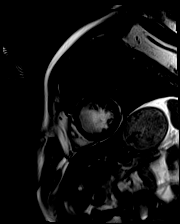

[Series 20: bSSFP · oblique · 8.0mm · 1.61mm/px · 1 of 25 slices shown (13 of 23)]
[im 1/25]
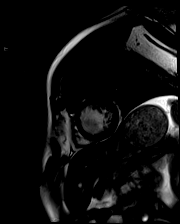

[Series 21: bSSFP · oblique · 8.0mm · 1.61mm/px · 1 of 25 slices shown (14 of 23)]
[im 1/25]
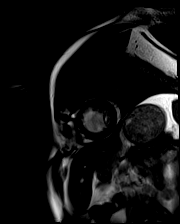

[Series 22: bSSFP · oblique · 8.0mm · 1.61mm/px · 1 of 25 slices shown (15 of 23)]
[im 1/25]
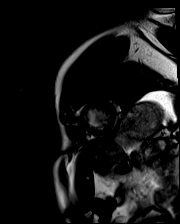

[Series 23: bSSFP · oblique · 8.0mm · 1.61mm/px · 1 of 25 slices shown (16 of 23)]
[im 1/25]
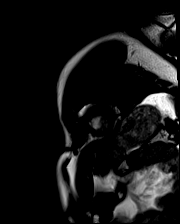

[Series 24: bSSFP · oblique · 8.0mm · 1.61mm/px · 1 of 25 slices shown (17 of 23)]
[im 1/25]
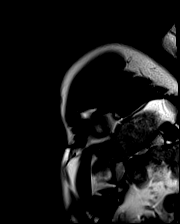

[Series 25: bSSFP · oblique · 8.0mm · 1.61mm/px · 1 of 25 slices shown (18 of 23)]
[im 1/25]
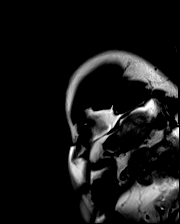

[Series 26: bSSFP · oblique · 8.0mm · 1.61mm/px · 1 of 25 slices shown (19 of 23)]
[im 1/25]
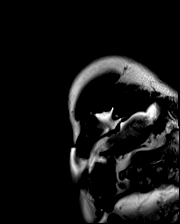

[Series 27: bSSFP · oblique · 8.0mm · 1.61mm/px · 1 of 25 slices shown (20 of 23)]
[im 1/25]
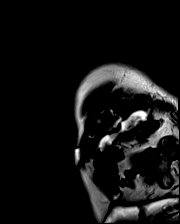

[Series 28: bSSFP · oblique · 6.0mm · 1.41mm/px · 1 of 25 slices shown (21 of 23)]
[im 1/25]
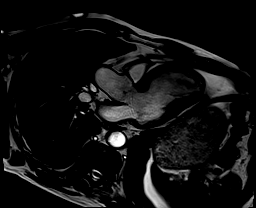

[Series 32: bSSFP · oblique · 6.0mm · 1.41mm/px · 1 of 25 slices shown (22 of 23)]
[im 1/25]
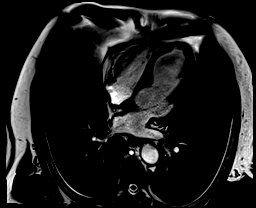

[Series 35: (id)_long_t1 · oblique · 8.0mm · 1.56mm/px · 1 of 24 slices shown]
[im 1/24]
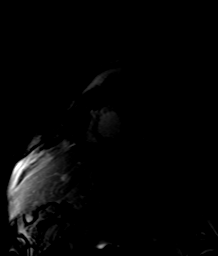

[Series 36: (id)_long_t1_moco · oblique · 8.0mm · 1.56mm/px · 1 of 24 slices shown]
[im 1/24]
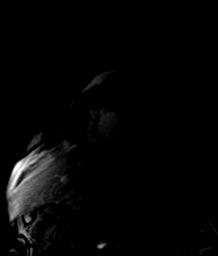

[Series 37: (id)_long_t1_moco_t1 · oblique · 8.0mm · 1.56mm/px · 1 of 6 slices shown]
[im 1/6]
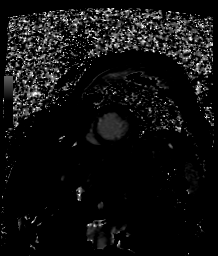

[Series 45: bSSFP · sagittal · 6.0mm · 1.41mm/px · 1 of 25 slices shown (23 of 23)]
[im 1/25]
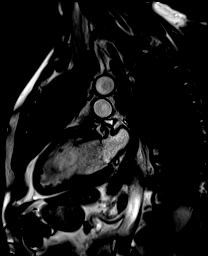

[Series 46: pre short axis · oblique · non-contrast · 8.0mm · 2.25mm/px · 1 of 10 slices shown (1 of 6)]
[im 1/10]
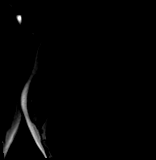

[Series 47: pre short axis · oblique · non-contrast · 8.0mm · 2.25mm/px · 1 of 10 slices shown (2 of 6)]
[im 1/10]
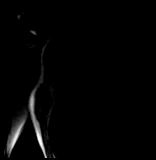

[Series 48: pre short axis · oblique · non-contrast · 8.0mm · 2.25mm/px · 1 of 10 slices shown (3 of 6)]
[im 1/10]
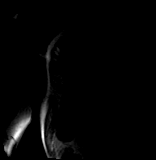

[Series 49: pre short axis · oblique · non-contrast · 8.0mm · 2.25mm/px · 1 of 10 slices shown (4 of 6)]
[im 1/10]
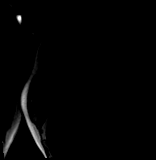

[Series 50: pre short axis · oblique · non-contrast · 8.0mm · 2.25mm/px · 1 of 10 slices shown (5 of 6)]
[im 1/10]
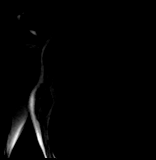

[Series 51: pre short axis · oblique · non-contrast · 8.0mm · 2.25mm/px · 1 of 10 slices shown (6 of 6)]
[im 1/10]
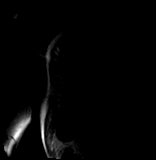

[Series 52: (id)_trufi · oblique · 8.0mm · 2.08mm/px · 1 of 9 slices shown]
[im 1/9]
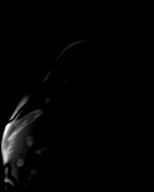

[Series 53: (id)_trufi_moco · oblique · 8.0mm · 2.08mm/px · 1 of 9 slices shown]
[im 1/9]
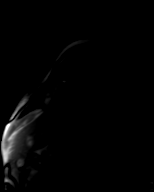

[Series 54: (id)_trufi_moco_t2 · oblique · 8.0mm · 2.08mm/px · 1 of 3 slices shown]
[im 1/3]
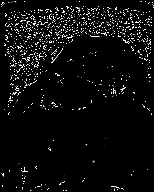

[Series 56: cine_trufi_short axis_cs_2_shot · oblique · 8.0mm · 1.48mm/px · 1 of 25 slices shown (1 of 9)]
[im 1/25]
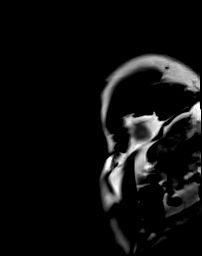

[Series 56: cine_trufi_short axis_cs_2_shot · oblique · 8.0mm · 1.48mm/px · 1 of 25 slices shown (2 of 9)]
[im 1/25]
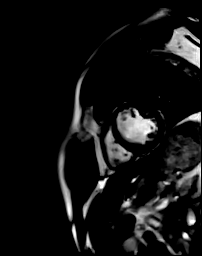

[Series 56: cine_trufi_short axis_cs_2_shot · oblique · 8.0mm · 1.48mm/px · 1 of 25 slices shown (3 of 9)]
[im 1/25]
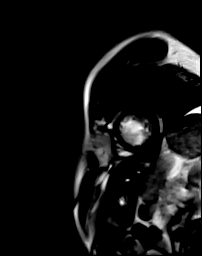

[Series 56: cine_trufi_short axis_cs_2_shot · oblique · 8.0mm · 1.48mm/px · 1 of 25 slices shown (4 of 9)]
[im 1/25]
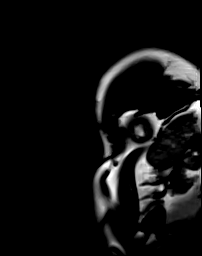

[Series 56: cine_trufi_short axis_cs_2_shot · oblique · 8.0mm · 1.48mm/px · 1 of 25 slices shown (5 of 9)]
[im 1/25]
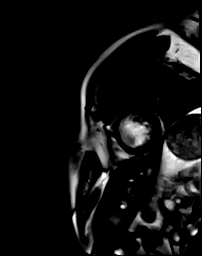

[Series 56: cine_trufi_short axis_cs_2_shot · oblique · 8.0mm · 1.48mm/px · 1 of 25 slices shown (6 of 9)]
[im 1/25]
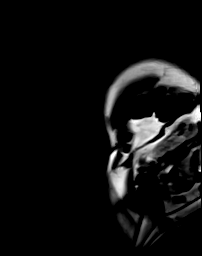

[Series 56: cine_trufi_short axis_cs_2_shot · oblique · 8.0mm · 1.48mm/px · 1 of 25 slices shown (7 of 9)]
[im 1/25]
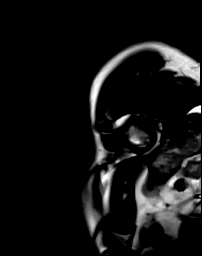

[Series 56: cine_trufi_short axis_cs_2_shot · oblique · 8.0mm · 1.48mm/px · 1 of 25 slices shown (8 of 9)]
[im 1/25]
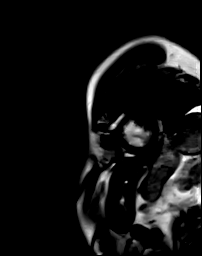

[Series 56: cine_trufi_short axis_cs_2_shot · oblique · 8.0mm · 1.48mm/px · 1 of 25 slices shown (9 of 9)]
[im 1/25]
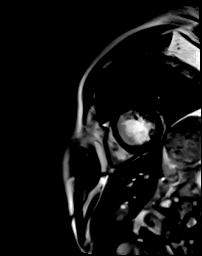

[45 of 48 positions shown; findings below may reference images not displayed]

FINDINGS: Limited images of the lung fields showed no gross abnormalities.

Mildly dilated left ventricular size and wall thickness (mildly
sigmoid septum but doubt of clinical significance). Mid to apical
anterior akinesis, mid to apical anteroseptal and inferoseptal
severe hypokinesis, severe hypokinesis of the true apex, severe
hypokinesis of the apical inferior wall. LV EF 30%. No LV thrombus
noted. Normal right ventricular size and systolic function, EF 56%.
Normal left and right atrial size. Trileaflet aortic valve with no
significant stenosis or regurgitation. No significant mitral
regurgitation noted.

DELAYED ENHANCEMENT IMAGING:
DELAYED ENHANCEMENT IMAGING
Mid inferolateral wall with > 50% wall thickness subendocardial late
gadolinium enhancement (LGE).

Mid to apical anteroseptal, inferoseptal, and anterior walls with <
50% wall thickness subendocardial LGE.

MEASUREMENTS:
MEASUREMENTS
LVEDV 216 mL

LVEDVi 109 mL/m2

LVSV 65 mL

LVEF 30%

RVEDV 107 mL

RVEDVi 54 mL/m2

RVSV 56 mL

RVEF 53%
IMPRESSION: 1. Mildly dilated left ventricle with wall motion abnormalities as
noted above, LV EF 30%.

2.  Normal RV size and systolic function.

3. Delayed enhancement pattern suggests prior MIs in the LAD
territory and LCx territory.

4.  I do not see definite evidence for hypertrophic cardiomyopathy.

MARK

## 2021-04-26 MED ORDER — GADOBUTROL 1 MMOL/ML IV SOLN
10.0000 mL | Freq: Once | INTRAVENOUS | Status: AC | PRN
  Administered 2021-04-26: 10 mL via INTRAVENOUS

## 2021-04-27 ENCOUNTER — Encounter: Payer: Self-pay | Admitting: Cardiology

## 2021-04-27 ENCOUNTER — Ambulatory Visit: Payer: Managed Care, Other (non HMO) | Admitting: Cardiology

## 2021-04-27 VITALS — BP 100/60 | HR 80 | Ht 70.0 in | Wt 166.6 lb

## 2021-04-27 DIAGNOSIS — I10 Essential (primary) hypertension: Secondary | ICD-10-CM

## 2021-04-27 DIAGNOSIS — I2510 Atherosclerotic heart disease of native coronary artery without angina pectoris: Secondary | ICD-10-CM | POA: Diagnosis not present

## 2021-04-27 DIAGNOSIS — E785 Hyperlipidemia, unspecified: Secondary | ICD-10-CM | POA: Diagnosis not present

## 2021-04-27 DIAGNOSIS — I255 Ischemic cardiomyopathy: Secondary | ICD-10-CM

## 2021-04-27 DIAGNOSIS — I5022 Chronic systolic (congestive) heart failure: Secondary | ICD-10-CM

## 2021-04-27 NOTE — Patient Instructions (Signed)

## 2021-05-05 ENCOUNTER — Other Ambulatory Visit: Payer: Managed Care, Other (non HMO) | Admitting: *Deleted

## 2021-05-05 DIAGNOSIS — I10 Essential (primary) hypertension: Secondary | ICD-10-CM

## 2021-05-05 DIAGNOSIS — N289 Disorder of kidney and ureter, unspecified: Secondary | ICD-10-CM

## 2021-05-05 DIAGNOSIS — I5022 Chronic systolic (congestive) heart failure: Secondary | ICD-10-CM

## 2021-05-05 LAB — BASIC METABOLIC PANEL
BUN/Creatinine Ratio: 22 — ABNORMAL HIGH (ref 9–20)
BUN: 28 mg/dL — ABNORMAL HIGH (ref 6–24)
CO2: 22 mmol/L (ref 20–29)
Calcium: 9.6 mg/dL (ref 8.7–10.2)
Chloride: 103 mmol/L (ref 96–106)
Creatinine, Ser: 1.29 mg/dL — ABNORMAL HIGH (ref 0.76–1.27)
Glucose: 195 mg/dL — ABNORMAL HIGH (ref 70–99)
Potassium: 4.3 mmol/L (ref 3.5–5.2)
Sodium: 138 mmol/L (ref 134–144)
eGFR: 64 mL/min/{1.73_m2} (ref >59)

## 2021-05-22 ENCOUNTER — Other Ambulatory Visit: Payer: Managed Care, Other (non HMO)

## 2021-05-22 DIAGNOSIS — I255 Ischemic cardiomyopathy: Secondary | ICD-10-CM

## 2021-05-22 DIAGNOSIS — I5022 Chronic systolic (congestive) heart failure: Secondary | ICD-10-CM

## 2021-05-22 LAB — BASIC METABOLIC PANEL
BUN/Creatinine Ratio: 19 (ref 9–20)
BUN: 24 mg/dL (ref 6–24)
CO2: 22 mmol/L (ref 20–29)
Calcium: 9.5 mg/dL (ref 8.7–10.2)
Chloride: 106 mmol/L (ref 96–106)
Creatinine, Ser: 1.24 mg/dL (ref 0.76–1.27)
Glucose: 109 mg/dL — ABNORMAL HIGH (ref 70–99)
Potassium: 4.3 mmol/L (ref 3.5–5.2)
Sodium: 138 mmol/L (ref 134–144)
eGFR: 67 mL/min/{1.73_m2} (ref >59)

## 2021-05-22 LAB — CBC WITH DIFFERENTIAL/PLATELET
Basophils Absolute: 0 10*3/uL (ref 0.0–0.2)
Basos: 0 %
EOS (ABSOLUTE): 0.2 10*3/uL (ref 0.0–0.4)
Eos: 2 %
Hematocrit: 43.5 % (ref 37.5–51.0)
Hemoglobin: 14.8 g/dL (ref 13.0–17.7)
Lymphocytes Absolute: 3.3 10*3/uL — ABNORMAL HIGH (ref 0.7–3.1)
Lymphs: 33 %
MCH: 30.5 pg (ref 26.6–33.0)
MCHC: 34 g/dL (ref 31.5–35.7)
MCV: 90 fL (ref 79–97)
Monocytes Absolute: 0.8 10*3/uL (ref 0.1–0.9)
Monocytes: 8 %
Neutrophils Absolute: 5.6 10*3/uL (ref 1.4–7.0)
Neutrophils: 57 %
Platelets: 320 10*3/uL (ref 150–450)
RBC: 4.85 x10E6/uL (ref 4.14–5.80)
RDW: 13.7 % (ref 11.6–15.4)
WBC: 10 10*3/uL (ref 3.4–10.8)

## 2021-06-02 ENCOUNTER — Ambulatory Visit (HOSPITAL_COMMUNITY)
Admission: RE | Disposition: A | Discharge status: Home / Self Care | Payer: Managed Care, Other (non HMO) | Source: Home / Self Care | Attending: Internal Medicine

## 2021-06-02 ENCOUNTER — Ambulatory Visit (HOSPITAL_COMMUNITY): Payer: Managed Care, Other (non HMO)

## 2021-06-02 ENCOUNTER — Other Ambulatory Visit: Payer: Self-pay

## 2021-06-02 ENCOUNTER — Ambulatory Visit (HOSPITAL_COMMUNITY)
Admission: RE | Admit: 2021-06-02 | Discharge: 2021-06-02 | Disposition: A | Discharge status: Home / Self Care | Payer: Managed Care, Other (non HMO) | Attending: Internal Medicine | Admitting: Internal Medicine

## 2021-06-02 DIAGNOSIS — I119 Hypertensive heart disease without heart failure: Secondary | ICD-10-CM | POA: Insufficient documentation

## 2021-06-02 DIAGNOSIS — I255 Ischemic cardiomyopathy: Secondary | ICD-10-CM | POA: Insufficient documentation

## 2021-06-02 DIAGNOSIS — Z794 Long term (current) use of insulin: Secondary | ICD-10-CM | POA: Diagnosis not present

## 2021-06-02 DIAGNOSIS — E785 Hyperlipidemia, unspecified: Secondary | ICD-10-CM | POA: Insufficient documentation

## 2021-06-02 DIAGNOSIS — Z79899 Other long term (current) drug therapy: Secondary | ICD-10-CM | POA: Diagnosis not present

## 2021-06-02 DIAGNOSIS — E119 Type 2 diabetes mellitus without complications: Secondary | ICD-10-CM | POA: Diagnosis not present

## 2021-06-02 DIAGNOSIS — Z7985 Long-term (current) use of injectable non-insulin antidiabetic drugs: Secondary | ICD-10-CM | POA: Insufficient documentation

## 2021-06-02 DIAGNOSIS — Z7984 Long term (current) use of oral hypoglycemic drugs: Secondary | ICD-10-CM | POA: Diagnosis not present

## 2021-06-02 HISTORY — PX: ICD IMPLANT: EP1208

## 2021-06-02 LAB — GLUCOSE, CAPILLARY
Glucose-Capillary: 184 mg/dL — ABNORMAL HIGH (ref 70–99)
Glucose-Capillary: 119 mg/dL — ABNORMAL HIGH (ref 70–99)

## 2021-06-02 IMAGING — DX DG CHEST 2V
2 series · 2 of 2 positions shown · non-contrast
Comparison: [DATE].

CLINICAL DATA: Pacemaker placement.

EXAM:
CHEST - 2 VIEW

[w chest lat]
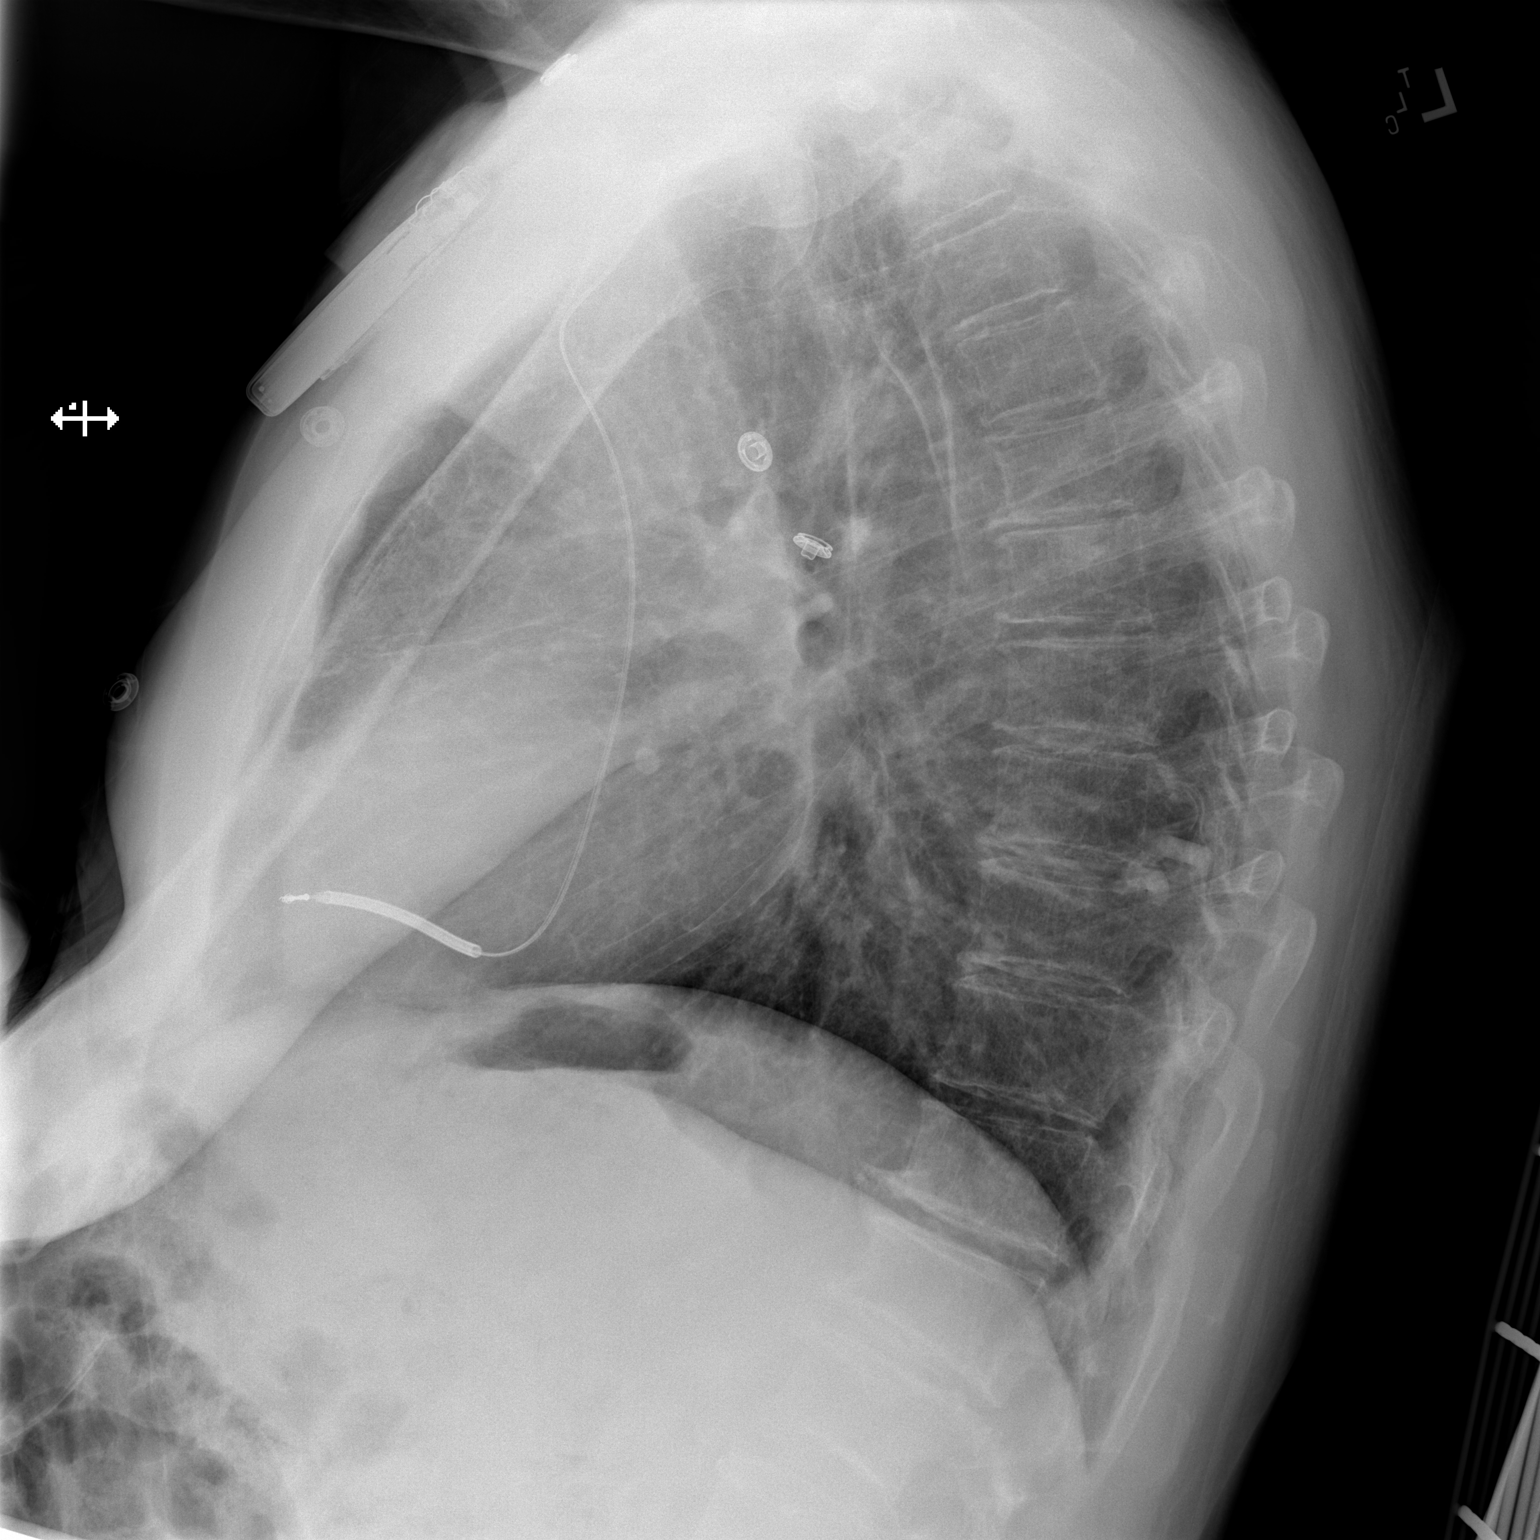

[x chest ap]
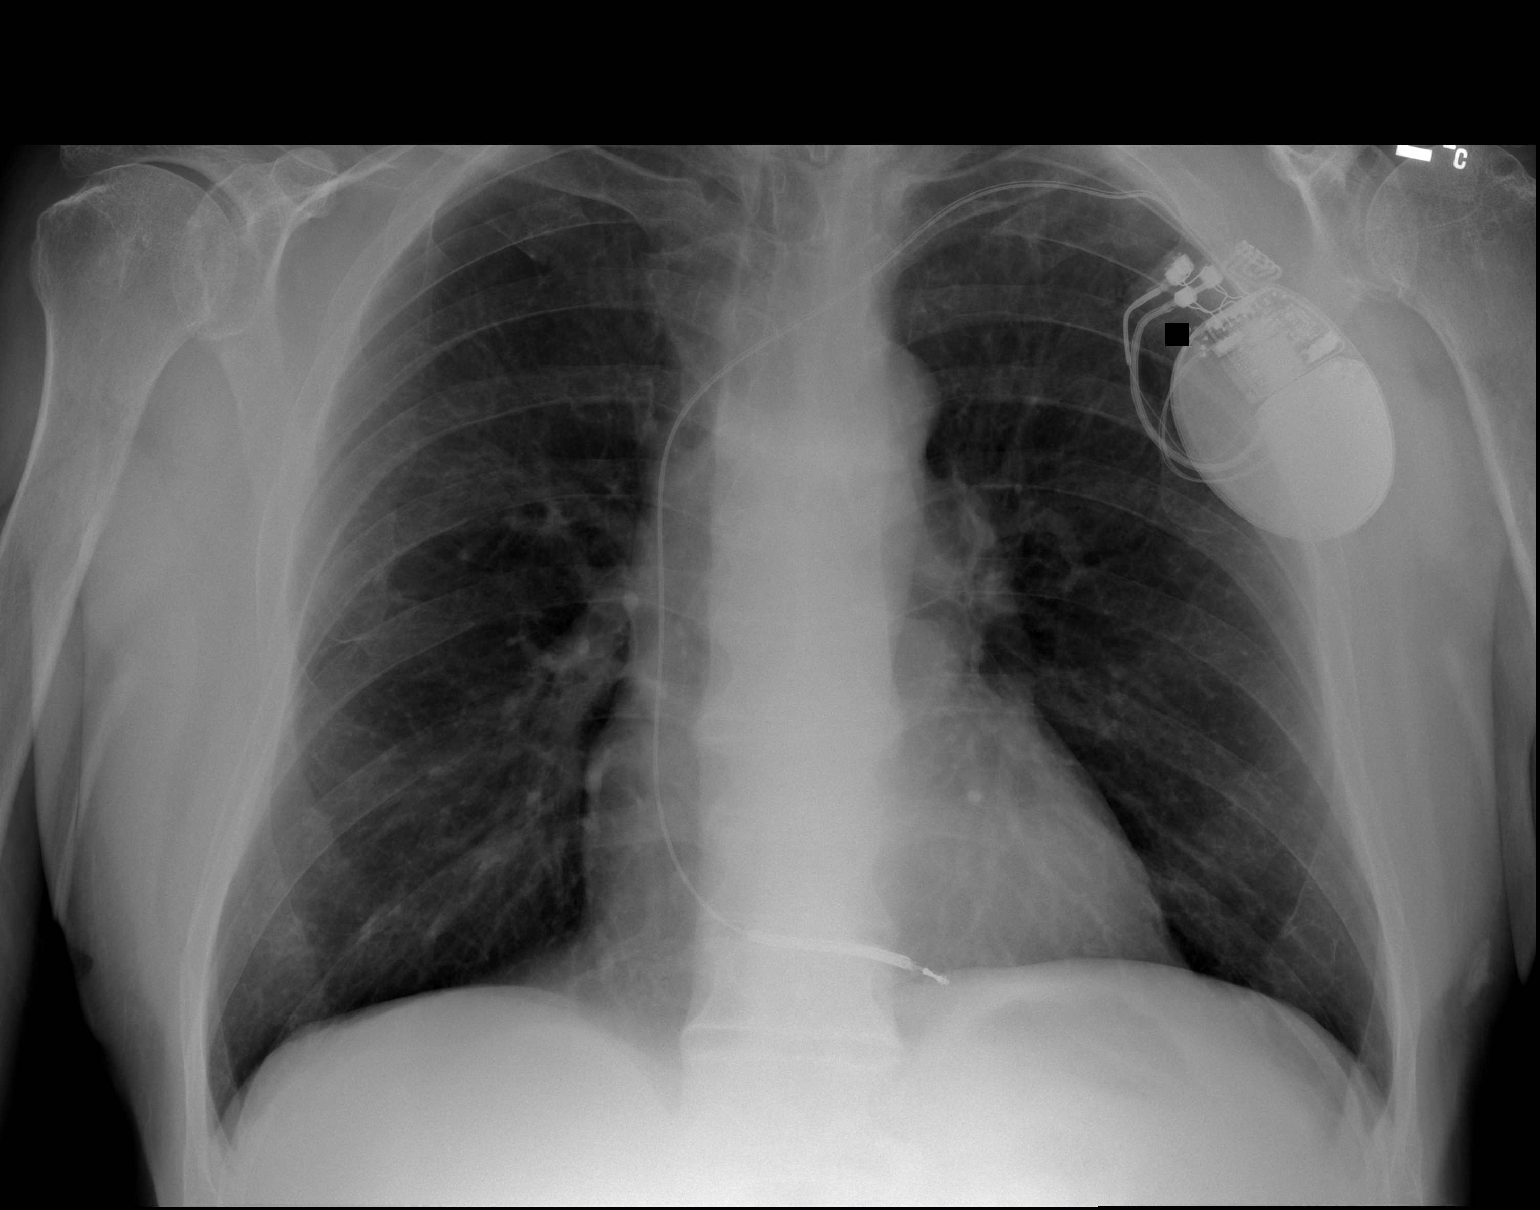

[2 of 2 positions shown; findings below may reference images not displayed]

FINDINGS: New left anterior chest wall single lead AICD. Lead projects in the
right ventricle.

Cardiac silhouette is normal in size. Normal mediastinal and hilar
contours.

Clear lungs.  No pleural effusion or pneumothorax.

Skeletal structures are intact.
IMPRESSION: 1. Well-positioned left anterior chest wall single lead AICD.
2. No pneumothorax.  No active cardiopulmonary disease.

## 2021-06-02 SURGERY — ICD IMPLANT

## 2021-06-02 MED ORDER — CEFAZOLIN SODIUM-DEXTROSE 2-4 GM/100ML-% IV SOLN: 2.0000 g | INTRAVENOUS | Status: DC

## 2021-06-02 MED ORDER — LIDOCAINE HCL (PF) 1 % IJ SOLN
INTRAMUSCULAR | Status: DC | PRN
  Administered 2021-06-02: 90 mL

## 2021-06-02 MED ORDER — SODIUM CHLORIDE 0.9 % IV SOLN
INTRAVENOUS | Status: DC | PRN
  Administered 2021-06-02: 80 mg

## 2021-06-02 MED ORDER — FENTANYL CITRATE (PF) 100 MCG/2ML IJ SOLN
INTRAMUSCULAR | Status: AC
  Filled 2021-06-02: qty 2

## 2021-06-02 MED ORDER — SODIUM CHLORIDE 0.9 % IV SOLN: INTRAVENOUS | Status: DC

## 2021-06-02 MED ORDER — SODIUM CHLORIDE 0.9 % IV SOLN: 80.0000 mg | INTRAVENOUS | Status: DC

## 2021-06-02 MED ORDER — MIDAZOLAM HCL 5 MG/5ML IJ SOLN
INTRAMUSCULAR | Status: DC | PRN
  Administered 2021-06-02: 1 mg via INTRAVENOUS
  Administered 2021-06-02: 2 mg via INTRAVENOUS

## 2021-06-02 MED ORDER — CEFAZOLIN SODIUM-DEXTROSE 2-4 GM/100ML-% IV SOLN
INTRAVENOUS | Status: AC
  Filled 2021-06-02: qty 100

## 2021-06-02 MED ORDER — LIDOCAINE HCL (PF) 1 % IJ SOLN
INTRAMUSCULAR | Status: AC
  Filled 2021-06-02: qty 60

## 2021-06-02 MED ORDER — SODIUM CHLORIDE 0.9 % IV SOLN
INTRAVENOUS | Status: AC
  Filled 2021-06-02: qty 2

## 2021-06-02 MED ORDER — POVIDONE-IODINE 10 % EX SWAB
2.0000 "application " | Freq: Once | CUTANEOUS | Status: AC
  Administered 2021-06-02: 2 via TOPICAL

## 2021-06-02 MED ORDER — CHLORHEXIDINE GLUCONATE 4 % EX LIQD: 4.0000 "application " | Freq: Once | CUTANEOUS | Status: DC

## 2021-06-02 MED ORDER — MIDAZOLAM HCL 5 MG/5ML IJ SOLN
INTRAMUSCULAR | Status: AC
  Filled 2021-06-02: qty 5

## 2021-06-02 MED ORDER — FENTANYL CITRATE (PF) 100 MCG/2ML IJ SOLN
INTRAMUSCULAR | Status: DC | PRN
  Administered 2021-06-02: 25 ug via INTRAVENOUS
  Administered 2021-06-02: 50 ug via INTRAVENOUS

## 2021-06-02 MED ORDER — CEFAZOLIN SODIUM-DEXTROSE 2-3 GM-%(50ML) IV SOLR
INTRAVENOUS | Status: DC | PRN
  Administered 2021-06-02: 2 g via INTRAVENOUS

## 2021-06-02 MED ORDER — LIDOCAINE HCL (PF) 1 % IJ SOLN
INTRAMUSCULAR | Status: AC
  Filled 2021-06-02: qty 30

## 2021-06-02 MED ORDER — ONDANSETRON HCL 4 MG/2ML IJ SOLN: 4.0000 mg | Freq: Four times a day (QID) | INTRAMUSCULAR | Status: DC | PRN

## 2021-06-02 MED ORDER — ACETAMINOPHEN 325 MG PO TABS: 325.0000 mg | ORAL_TABLET | ORAL | Status: DC | PRN

## 2021-06-02 MED ORDER — HEPARIN (PORCINE) IN NACL 1000-0.9 UT/500ML-% IV SOLN
INTRAVENOUS | Status: DC | PRN
  Administered 2021-06-02: 500 mL

## 2021-06-02 MED ORDER — HEPARIN (PORCINE) IN NACL 1000-0.9 UT/500ML-% IV SOLN
INTRAVENOUS | Status: AC
  Filled 2021-06-02: qty 500

## 2021-06-02 SURGICAL SUPPLY — 6 items
CABLE SURGICAL S-101-97-12 (CABLE) ×2 IMPLANT
ICD MOMENTUM D120 (ICD Generator) ×1 IMPLANT
LEAD RELIANCE 0138-64 (Lead) ×1 IMPLANT
PAD DEFIB RADIO PHYSIO CONN (PAD) ×2 IMPLANT
SHEATH 9.5FR PRELUDE SNAP 13 (SHEATH) ×1 IMPLANT
TRAY PACEMAKER INSERTION (PACKS) ×2 IMPLANT

## 2021-06-02 NOTE — H&P (Signed)
Patient Care Team: Libby Maw, MD as PCP - General (Family Medicine) Lelon Perla, MD as PCP - Cardiology (Cardiology)   HPI  John [name] is a 61 y.o. male admitted for ICD for primary prevention in the setting of ischemic heart disease The patient denies chest pain, shortness of breath, nocturnal dyspnea, orthopnea or peripheral edema.  There have been no palpitations, lightheadedness or syncope.        EF   6/22 Echo   40-45 % ? Mural thrombus  8/22 LHC   LADp-99%; OM1-80%; PDA1-80; PDA 2-90  3/23 Echo  25-30% LVH-moderate (16/14 mm)  4/23 cMRI  30% >50% wall thickness Inferolateral    Records and Results Reviewed   Past Medical History:  Diagnosis Date   Arthritis    Diabetes mellitus without complication ([place])    Hyperlipidemia    Hypertension    Stroke ([place]) 05/2020    Past Surgical History:  Procedure Laterality Date   LEFT HEART CATH AND CORONARY ANGIOGRAPHY N/A 08/18/2020   Procedure: LEFT HEART CATH AND CORONARY ANGIOGRAPHY;  Surgeon: Martinique, Peter M, MD;  Location: [facility];  Service: Cardiovascular;  Laterality: N/A;   TONSILLECTOMY      Current Facility-Administered Medications  Medication Dose Route Frequency Provider Last Rate Last Admin   0.9 %  sodium chloride infusion   Intravenous Continuous Deboraha Sprang, MD 50 mL/hr at 06/02/21 0626 New Bag at 06/02/21 0626   0.9 %  sodium chloride infusion   Intravenous Continuous Deboraha Sprang, MD 50 mL/hr at 06/02/21 0626 New Bag at 06/02/21 0626   ceFAZolin (ANCEF) IVPB 2g/100 mL premix  2 g Intravenous On Call Deboraha Sprang, MD       chlorhexidine (HIBICLENS) 4 % liquid 4 application.  4 application. Topical Once Deboraha Sprang, MD       gentamicin (GARAMYCIN) 80 mg in sodium chloride 0.9 % 500 mL irrigation  80 mg Irrigation On Call Deboraha Sprang, MD        No Known Allergies    Social History   Tobacco Use   Smoking status: Former   Smokeless tobacco:  Never   Tobacco comments:    Quit in 2017   Vaping Use   Vaping Use: Never used  Substance Use Topics   Alcohol use: Yes    Comment: Socially   Drug use: Never     Family History  Problem Relation Age of Onset   Diabetes Mother    CAD Mother    Heart attack Mother        x 2   COPD Father    Colon cancer Neg Hx    Esophageal cancer Neg Hx    Rectal cancer Neg Hx    Stomach cancer Neg Hx      Current Facility-Administered Medications for the 06/02/21 encounter [facility] Encounter)  Medication   sodium chloride flush (NS) 0.9 % injection 3 mL   Current Meds  Medication Sig   acetaminophen (TYLENOL) 500 MG tablet Take 500 mg by mouth every 6 (six) hours as needed for moderate pain or headache.   apixaban (ELIQUIS) 5 MG TABS tablet TAKE ONE TABLET BY MOUTH TWICE A DAY   Aspirin-Caffeine (BC FAST PAIN RELIEF PO) Take 1 packet by mouth daily as needed (pain).   carvedilol (COREG) 12.5 MG tablet Take 1 tablet (12.5 mg total) by mouth 2 (two) times daily with a meal.  Dulaglutide (TRULICITY) 3 VH/8.4ON SOPN Inject 3 mg as directed once a week. (Patient taking differently: Inject 3 mg as directed every Sunday.)   Evolocumab (REPATHA SURECLICK) 629 MG/ML SOAJ Inject 140 mg into the skin every 14 (fourteen) days.   ezetimibe (ZETIA) 10 MG tablet Take 1 tablet (10 mg total) by mouth daily. (Patient taking differently: Take 10 mg by mouth every evening.)   FARXIGA 10 MG TABS tablet Take 1 tablet (10 mg total) by mouth daily. (Patient taking differently: Take 10 mg by mouth every evening.)   insulin glargine (LANTUS SOLOSTAR) 100 UNIT/ML Solostar Pen Inject 40 Units into the skin every morning. And pen needles 1/day (Patient taking differently: Inject 45 Units into the skin every evening. And pen needles 1/day)   sacubitril-valsartan (ENTRESTO) 24-26 MG Take 1 tablet by mouth 2 (two) times daily.   sildenafil (VIAGRA) 100 MG tablet TAKE ONE TABLET BY MOUTH DAILY AS NEEDED FOR ERECTILE  DYSFUNCTION (Patient taking differently: Take 100 mg by mouth as needed for erectile dysfunction.)   spironolactone (ALDACTONE) 25 MG tablet Take 0.5 tablets (12.5 mg total) by mouth daily. (Patient taking differently: Take 12.5 mg by mouth every evening.)     Review of Systems negative except from HPI and PMH  Physical Exam BP 105/87   Pulse 80   Temp 98.2 F (36.8 C) (Oral)   Resp 18   Ht '5\' 10"'$  (1.778 m)   Wt 74.8 kg   SpO2 98%   BMI 23.68 kg/m  Well developed and well nourished in no acute distress HENT normal E scleral and icterus clear Neck Supple JVP flat; carotids brisk and full Clear to ausculation Regular rate and rhythm, no murmurs gallops or rub Soft with active bowel sounds No clubbing cyanosis  Edema Alert and oriented, grossly normal motor and sensory function Skin Warm and Dry    Assessment and  Plan  Ischemic cardiomyopathy with three-vessel disease but a small LAD felt not to be amenable to revascularization with a likely nonviable wall   Left ventricular hypertrophy   Elevated potassium   Hypertension    Hyperlipidemia on Repatha

## 2021-06-02 NOTE — Discharge Instructions (Signed)
After Your ICD (Implantable Cardiac Defibrillator)   You have a Chemical engineer ICD  ACTIVITY Do not lift your arm above shoulder height for 1 week after your procedure. After 7 days, you may progress as below.  You should remove your sling 24 hours after your procedure, unless otherwise instructed by your provider.     Friday June 09, 2021  Saturday June 10, 2021 Sunday June 11, 2021 Monday June 12, 2021   Do not lift, push, pull, or carry anything over 10 pounds with the affected arm until 6 weeks (Friday July 14, 2021 ) after your procedure.   You may drive AFTER your wound check, unless you have been told otherwise by your provider.   Ask your healthcare provider when you can go back to work   INCISION/Dressin If you are on a blood thinner such as Coumadin, Xarelto, Eliquis, Plavix, or Pradaxa please confirm with your provider when this should be resumed. 6/5  If large square, outer bandage is left in place, this can be removed after 24 hours from your procedure. Do not remove steri-strips or glue as below.   Monitor your defibrillator site for redness, swelling, and drainage. Call the device clinic at [phone] if you experience these symptoms or fever/chills.  If your incision is sealed with Steri-strips or staples, you may shower 7 days after your procedure or when told by your provider. Do not remove the steri-strips or let the shower hit directly on your site. You may wash around your site with soap and water.    If you were discharged in a sling, please do not wear this during the day more than 48 hours after your surgery unless otherwise instructed. This may increase the risk of stiffness and soreness in your shoulder.   Avoid lotions, ointments, or perfumes over your incision until it is well-healed.  You may use a hot tub or a pool AFTER your wound check appointment if the incision is completely closed.  Your ICD is designed to protect you from life threatening  heart rhythms. Because of this, you may receive a shock.   1 shock with no symptoms:  Call the office during business hours. 1 shock with symptoms (chest pain, chest pressure, dizziness, lightheadedness, shortness of breath, overall feeling unwell):  Call 911. If you experience 2 or more shocks in 24 hours:  Call 911. If you receive a shock, you should not drive for 6 months per the [place] DMV IF you receive appropriate therapy from your ICD.   ICD Alerts:  Some alerts are vibratory and others beep. These are NOT emergencies. Please call our office to let us know. If this occurs at night or on weekends, it can wait until the next business day. Send a remote transmission.  If your device is capable of reading fluid status (for heart failure), you will be offered monthly monitoring to review this with you.   DEVICE MANAGEMENT Remote monitoring is used to monitor your ICD from home. This monitoring is scheduled every 91 days by our office. It allows [place] to keep an eye on the functioning of your device to ensure it is working properly. You will routinely see your Electrophysiologist annually (more often if necessary).   You should receive your ID card for your new device in 4-8 weeks. Keep this card with you at all times once received. Consider wearing a medical alert bracelet or necklace.  Your ICD  may be MRI compatible. This will be discussed at your  next office visit/wound check.  You should avoid contact with strong electric or magnetic fields.   Do not use amateur (ham) radio equipment or electric (arc) welding torches. MP3 player headphones with magnets should not be used. Some devices are safe to use if held at least 12 inches (30 cm) from your defibrillator. These include power tools, lawn mowers, and speakers. If you are unsure if something is safe to use, ask your health care provider.  When using your cell phone, hold it to the ear that is on the opposite side from the defibrillator. Do not  leave your cell phone in a pocket over the defibrillator.  You may safely use electric blankets, heating pads, computers, and microwave ovens.  Call the office right away if: You have chest pain. You feel more than one shock. You feel more short of breath than you have felt before. You feel more light-headed than you have felt before. Your incision starts to open up.  This information is not intended to replace advice given to you by your health care provider. Make sure you discuss any questions you have with your health care provider.

## 2021-06-02 NOTE — Progress Notes (Signed)
Dr Caryl Comes notified of post CXR

## 2021-06-04 MED FILL — Cefazolin Sodium-Dextrose IV Solution 2 GM/100ML-4%: INTRAVENOUS | Qty: 100 | Status: AC

## 2021-06-05 ENCOUNTER — Telehealth: Payer: Self-pay

## 2021-06-05 ENCOUNTER — Encounter (HOSPITAL_COMMUNITY): Payer: Self-pay | Admitting: Internal Medicine

## 2021-06-05 NOTE — Telephone Encounter (Signed)
-----   Message from Shirley Friar, [place] sent at 06/02/2021  4:07 PM EDT ----- Same day ICD SK 6/2

## 2021-06-05 NOTE — Telephone Encounter (Signed)
Follow-up after same day discharge: Implant date: 06/02/2021 MD: Virl Axe, MD Device: [place] EL ICD Location: Left Chest   Wound check visit: 06/14/2021 '@11'$ :20 am 90 day MD follow-up: 09/05/2021 @ 2:15 pm  Remote Transmission received:Yes  Dressing/sling removed: Yes  Confirm OAC restart on: Yes

## 2021-06-14 ENCOUNTER — Ambulatory Visit: Payer: Managed Care, Other (non HMO)

## 2021-06-14 DIAGNOSIS — I255 Ischemic cardiomyopathy: Secondary | ICD-10-CM

## 2021-06-14 LAB — CUP PACEART INCLINIC DEVICE CHECK
Date Time Interrogation Session: 20230614000000
HighPow Impedance: 69 Ohm
Implantable Lead Implant Date: 20230602
Implantable Lead Location: 753860
Implantable Lead Model: 138
Implantable Lead Serial Number: 303305
Implantable Pulse Generator Implant Date: 20230602
Lead Channel Impedance Value: 538 Ohm
Lead Channel Pacing Threshold Amplitude: 0.8 V
Lead Channel Pacing Threshold Pulse Width: 0.4 ms
Lead Channel Sensing Intrinsic Amplitude: 15.3 mV
Lead Channel Setting Pacing Amplitude: 3.5 V
Lead Channel Setting Pacing Pulse Width: 0.4 ms
Lead Channel Setting Sensing Sensitivity: 0.5 mV
Pulse Gen Serial Number: 217470

## 2021-06-14 NOTE — Progress Notes (Signed)

## 2021-06-14 NOTE — Patient Instructions (Signed)

## 2021-09-05 ENCOUNTER — Ambulatory Visit (INDEPENDENT_AMBULATORY_CARE_PROVIDER_SITE_OTHER): Payer: Managed Care, Other (non HMO)

## 2021-09-05 ENCOUNTER — Encounter: Payer: Self-pay | Admitting: Internal Medicine

## 2021-09-05 ENCOUNTER — Ambulatory Visit: Payer: Managed Care, Other (non HMO) | Attending: Internal Medicine | Admitting: Internal Medicine

## 2021-09-05 VITALS — BP 96/64 | HR 89 | Ht 70.0 in | Wt 173.2 lb

## 2021-09-05 DIAGNOSIS — I5022 Chronic systolic (congestive) heart failure: Secondary | ICD-10-CM

## 2021-09-05 DIAGNOSIS — I255 Ischemic cardiomyopathy: Secondary | ICD-10-CM

## 2021-09-05 LAB — CUP PACEART REMOTE DEVICE CHECK
Battery Remaining Longevity: 180 mo
Battery Remaining Percentage: 100 %
Brady Statistic RV Percent Paced: 0 %
Date Time Interrogation Session: 20230905041100
HighPow Impedance: 74 Ohm
Implantable Lead Implant Date: 20230602
Implantable Lead Location: 753860
Implantable Lead Model: 138
Implantable Lead Serial Number: 303305
Implantable Pulse Generator Implant Date: 20230602
Lead Channel Impedance Value: 564 Ohm
Lead Channel Pacing Threshold Amplitude: 0.4 V
Lead Channel Pacing Threshold Pulse Width: 0.4 ms
Lead Channel Setting Pacing Amplitude: 3.5 V
Lead Channel Setting Pacing Pulse Width: 0.4 ms
Lead Channel Setting Sensing Sensitivity: 0.5 mV
Pulse Gen Serial Number: 217470

## 2021-09-05 NOTE — Patient Instructions (Signed)
Medication Instructions:  Your physician recommends that you continue on your current medications as directed. Please refer to the Current Medication list given to you today.   *If you need a refill on your cardiac medications before your next appointment, please call your pharmacy*   Lab Work: None ordered.  If you have labs (blood work) drawn today and your tests are completely normal, you will receive your results only by: MyChart Message (if you have MyChart) OR A paper copy in the mail If you have any lab test that is abnormal or we need to change your treatment, we will call you to review the results.   Testing/Procedures: None ordered.    Follow-Up: At [facility], you and your health needs are our priority.  As part of our continuing mission to provide you with exceptional heart care, we have created designated Provider Care Teams.  These Care Teams include your primary Cardiologist (physician) and Advanced Practice Providers (APPs -  Physician Assistants and Nurse Practitioners) who all work together to provide you with the care you need, when you need it.  We recommend signing up for the patient portal called "MyChart".  Sign up information is provided on this After Visit Summary.  MyChart is used to connect with patients for Virtual Visits (Telemedicine).  Patients are able to view lab/test results, encounter notes, upcoming appointments, etc.  Non-urgent messages can be sent to your provider as well.   To learn more about what you can do with MyChart, go to [email or web address].    Your next appointment:   9 months with Dr Klein  Important Information About Sugar       

## 2021-09-05 NOTE — Progress Notes (Signed)
Patient Care Team: Libby Maw, MD as PCP - General (Family Medicine) Lelon Perla, MD as PCP - Cardiology (Cardiology)   HPI  John [name] is a 61 y.o. male seen in followup for ICD [facility] [employer] implanted 2023 for primary prevention for ischemic cardiomyopathy  The patient denies chest pain, shortness of breath, nocturnal dyspnea, orthopnea or peripheral edema.  There have been no palpitations, lightheadedness or syncope.      DATE TEST EF    6/22 Echo   40-45 % ? Mural thrombus  8/22 LHC   LADp-99%; OM1-80%; PDA1-80; PDA 2-90  3/23 Echo  25-30% LVH-moderate (16/14 mm)  4/23 cMRI  30% Piror Scar No HCM    Date Cr K Hgb  1/23 1.22 5.0 15.1               Records and Results Reviewed   Past Medical History:  Diagnosis Date   Arthritis    Diabetes mellitus without complication ([place])    Hyperlipidemia    Hypertension    Stroke ([place]) 05/2020    Past Surgical History:  Procedure Laterality Date   ICD IMPLANT N/A 06/02/2021   Procedure: ICD IMPLANT;  Surgeon: Deboraha Sprang, MD;  Location: [place] [facility];  Service: Cardiovascular;  Laterality: N/A;   LEFT HEART CATH AND CORONARY ANGIOGRAPHY N/A 08/18/2020   Procedure: LEFT HEART CATH AND CORONARY ANGIOGRAPHY;  Surgeon: Martinique, Peter M, MD;  Location: [facility];  Service: Cardiovascular;  Laterality: N/A;   TONSILLECTOMY      Current Meds  Medication Sig   acetaminophen (TYLENOL) 500 MG tablet Take 500 mg by mouth every 6 (six) hours as needed for moderate pain or headache.   apixaban (ELIQUIS) 5 MG TABS tablet TAKE ONE TABLET BY MOUTH TWICE A DAY   Aspirin-Caffeine (BC FAST PAIN RELIEF PO) Take 1 packet by mouth daily as needed (pain).   carbamide peroxide (DEBROX) 6.5 % OTIC solution Place 5 drops into both ears 2 (two) times daily. (Patient taking differently: Place 5 drops into both ears 2 (two) times daily as needed (wax removal).)   carvedilol (COREG) 12.5 MG tablet  Take 1 tablet (12.5 mg total) by mouth 2 (two) times daily with a meal.   Continuous Blood Gluc Sensor (FREESTYLE LIBRE 14 DAY SENSOR) MISC 1 Device by Does not apply route every 14 (fourteen) days.   diclofenac Sodium (VOLTAREN) 1 % GEL Rub a small grape sized dollop into knee up to 3 times daily as needed.   Dulaglutide (TRULICITY) 3 ZS/0.1UX SOPN Inject 3 mg as directed once a week. (Patient taking differently: Inject 3 mg as directed every Sunday.)   Evolocumab (REPATHA SURECLICK) 323 MG/ML SOAJ Inject 140 mg into the skin every 14 (fourteen) days.   ezetimibe (ZETIA) 10 MG tablet Take 1 tablet (10 mg total) by mouth daily. (Patient taking differently: Take 10 mg by mouth every evening.)   FARXIGA 10 MG TABS tablet Take 1 tablet (10 mg total) by mouth daily. (Patient taking differently: Take 10 mg by mouth every evening.)   glucose blood test strip 1 each by Other route 2 (two) times daily. And lancets 2/day   insulin glargine (LANTUS SOLOSTAR) 100 UNIT/ML Solostar Pen Inject 40 Units into the skin every morning. And pen needles 1/day (Patient taking differently: Inject 45 Units into the skin every evening. And pen needles 1/day)   Insulin Pen Needle (PEN NEEDLES) 32G X 4  MM MISC 1 Package by Does not apply route 4 (four) times daily.   sacubitril-valsartan (ENTRESTO) 24-26 MG Take 1 tablet by mouth 2 (two) times daily.   sildenafil (VIAGRA) 100 MG tablet TAKE ONE TABLET BY MOUTH DAILY AS NEEDED FOR ERECTILE DYSFUNCTION (Patient taking differently: Take 100 mg by mouth as needed for erectile dysfunction.)   spironolactone (ALDACTONE) 25 MG tablet Take 0.5 tablets (12.5 mg total) by mouth daily. (Patient taking differently: Take 12.5 mg by mouth every evening.)   Current Facility-Administered Medications for the 09/05/21 encounter (Office Visit) with Deboraha Sprang, MD  Medication   sodium chloride flush (NS) 0.9 % injection 3 mL    No Known Allergies    Review of Systems negative except  from HPI and PMH  Physical Exam BP 96/64   Pulse 89   Ht '5\' 10"'$  (1.778 m)   Wt 173 lb 3.2 oz (78.6 kg)   SpO2 97%   BMI 24.85 kg/m  Well developed and well nourished in no acute distress HENT normal E scleral and icterus clear Neck Supple JVP flat; carotids brisk and full Clear to ausculation Regular rate and rhythm, no murmurs gallops or rub Soft with active bowel sounds No clubbing cyanosis  Edema Alert and oriented, grossly normal motor and sensory function Skin Warm and Dry  ECG sinus at 89 Interval 17/09/37  CrCl cannot be calculated (Patient's most recent lab result is older than the maximum 21 days allowed.).   Assessment and  Plan  Ischemic cardiomyopathy  Left ventricular hypertrophy without evidence of HCM  Hypertension now hypotension  ICD-[employer]-primary prevention  CVA LV rouleaux formation>> Eliquis per Dr. Cherylann Ratel   Device function is normal.  Reprogrammed.  No real arrhythmias.  Blood sugar is low, but no symptoms.  We will continue him on Entresto carvedilol and spironolactone  We will bleeding on apixaban continue 5 twice daily   Current medicines are reviewed at length with the patient today .  The patient does not  have concerns regarding medicines.

## 2021-09-05 NOTE — Progress Notes (Unsigned)
Name: John [name]  Age/ Sex: 61 y.o., male   MRN/ DOB: [id number], February 13, 1960     PCP: Libby Maw, MD   Reason for Endocrinology Evaluation: Type 2 Diabetes Mellitus  Initial Endocrine Consultative Visit: 12/11/2017    PATIENT IDENTIFIER: John [name] is a 61 y.o. male with a past medical history of T2DM, Hx CVA, CHF and CAD. The patient has followed with Endocrinology clinic since 12/11/20219 for consultative assistance with management of his diabetes.  DIABETIC HISTORY:  John [name] was diagnosed with DM 2004, and started insulin therapy approximately 2012 . His hemoglobin A1c has ranged from 6.3% in 2022, peaking at 8.4% in 23.    He was followed up by Dr. Loanne Drilling between 2019 until March 2023  SUBJECTIVE:   During the last visit (03/31/2021): Saw Dr. Loanne Drilling  Today (09/06/2021): John [name]  He checks his blood sugars occasionally  times daily. The patient has not had hypoglycemic episodes since the last clinic visit   Patient is s/p ICD placement on 06/02/2021   Denies SOB  Denies nausea, vomiting or diarrhea   Works 3rd shift , so he takes his meds at night     HOME DIABETES REGIMEN:  Farxiga 10 mg daily Trulicity 3 mg weekly ( Sundays)  Lantus 45 units daily      Statin: no, patient on Repatha ACE-I/ARB: Yes    METER DOWNLOAD SUMMARY: Did not bring     DIABETIC COMPLICATIONS: Microvascular complications:   Denies: CKD, neuropathy, retinopathy  Last Eye Exam: Completed 08/2021  Macrovascular complications:  CVA (03/2200), CAD Denies: PVD   HISTORY:  Past Medical History:  Past Medical History:  Diagnosis Date   Arthritis    Diabetes mellitus without complication ([place])    Hyperlipidemia    Hypertension    Stroke ([place]) 05/2020   Past Surgical History:  Past Surgical History:  Procedure Laterality Date   ICD IMPLANT N/A 06/02/2021   Procedure: ICD IMPLANT;  Surgeon: Deboraha Sprang, MD;  Location: [facility];   Service: Cardiovascular;  Laterality: N/A;   LEFT HEART CATH AND CORONARY ANGIOGRAPHY N/A 08/18/2020   Procedure: LEFT HEART CATH AND CORONARY ANGIOGRAPHY;  Surgeon: Martinique, Peter M, MD;  Location: [facility];  Service: Cardiovascular;  Laterality: N/A;   TONSILLECTOMY     Social History:  reports that he has quit smoking. He has never used smokeless tobacco. He reports current alcohol use. He reports that he does not use drugs. Family History:  Family History  Problem Relation Age of Onset   Diabetes Mother    CAD Mother    Heart attack Mother        x 2   COPD Father    Colon cancer Neg Hx    Esophageal cancer Neg Hx    Rectal cancer Neg Hx    Stomach cancer Neg Hx      HOME MEDICATIONS: Allergies as of 09/06/2021   No Known Allergies      Medication List        Accurate as of September 06, 2021  8:09 AM. If you have any questions, ask your nurse or doctor.          STOP taking these medications    FreeStyle Libre 14 Day Sensor Misc Stopped by: Dorita Sciara, MD       TAKE these medications    acetaminophen 500 MG tablet Commonly known as: TYLENOL Take 500 mg by mouth  every 6 (six) hours as needed for moderate pain or headache.   BC FAST PAIN RELIEF PO Take 1 packet by mouth daily as needed (pain).   carvedilol 12.5 MG tablet Commonly known as: COREG Take 1 tablet (12.5 mg total) by mouth 2 (two) times daily with a meal.   Debrox 6.5 % OTIC solution Generic drug: carbamide peroxide Place 5 drops into both ears 2 (two) times daily. What changed:  when to take this reasons to take this   diclofenac Sodium 1 % Gel Commonly known as: Voltaren Rub a small grape sized dollop into knee up to 3 times daily as needed.   Eliquis 5 MG Tabs tablet Generic drug: apixaban TAKE ONE TABLET BY MOUTH TWICE A DAY   Entresto 24-26 MG Generic drug: sacubitril-valsartan Take 1 tablet by mouth 2 (two) times daily.   ezetimibe 10 MG tablet Commonly  known as: Zetia Take 1 tablet (10 mg total) by mouth daily. What changed: when to take this   Farxiga 10 MG Tabs tablet Generic drug: dapagliflozin propanediol Take 1 tablet (10 mg total) by mouth daily. What changed: when to take this   glucose blood test strip 1 each by Other route 2 (two) times daily. And lancets 2/day   Lantus SoloStar 100 UNIT/ML Solostar Pen Generic drug: insulin glargine Inject 50 Units into the skin every morning. And pen needles 1/day What changed: how much to take Changed by: Ibtehal J Shamleffer, MD   Pen Needles 32G X 4 MM Misc 1 Package by Does not apply route daily in the afternoon. What changed: when to take this Changed by: Ibtehal J Shamleffer, MD   Repatha SureClick 140 MG/ML Soaj Generic drug: Evolocumab Inject 140 mg into the skin every 14 (fourteen) days.   sildenafil 100 MG tablet Commonly known as: VIAGRA TAKE ONE TABLET BY MOUTH DAILY AS NEEDED FOR ERECTILE DYSFUNCTION What changed: See the new instructions.   spironolactone 25 MG tablet Commonly known as: Aldactone Take 0.5 tablets (12.5 mg total) by mouth daily. What changed: when to take this   Trulicity 3 MG/0.5ML Sopn Generic drug: Dulaglutide Inject 3 mg as directed once a week. What changed: when to take this         OBJECTIVE:   Vital Signs: BP 120/84 (BP Location: Left Arm, Patient Position: Sitting, Cuff Size: Small)   Pulse 85   Ht 5' 10" (1.778 m)   Wt 172 lb 9.6 oz (78.3 kg)   SpO2 98%   BMI 24.77 kg/m   Wt Readings from Last 3 Encounters:  09/06/21 172 lb 9.6 oz (78.3 kg)  09/05/21 173 lb 3.2 oz (78.6 kg)  06/02/21 165 lb (74.8 kg)     Exam: General: Pt appears well and is in NAD  Neck: General: Supple without adenopathy. Thyroid: Thyroid size normal.  No goiter or nodules appreciated.   Lungs: Clear with good BS bilat   Heart: RRR   Abdomen:  soft, nontender  Extremities: No pretibial edema.   Neuro: MS is good with appropriate affect, pt is  alert and Ox3    DM foot exam: 09/06/2021  The skin of the feet is  without sores or ulcerations, dystrophic nails  The pedal pulses are 1+ on right and 1+ on left. The sensation is intact to a screening 5.07, 10 gram monofilament bilaterally          DATA REVIEWED:  Lab Results  Component Value Date   HGBA1C 8.3 (A) 09/06/2021     HGBA1C 8.4 (A) 03/31/2021   HGBA1C 6.9 (A) 01/05/2021    Latest Reference Range & Units 05/22/21 14:12  Sodium 134 - 144 mmol/L 138  Potassium 3.5 - 5.2 mmol/L 4.3  Chloride 96 - 106 mmol/L 106  CO2 20 - 29 mmol/L 22  Glucose 70 - 99 mg/dL 109 (H)  BUN 6 - 24 mg/dL 24  Creatinine 0.76 - 1.27 mg/dL 1.24  Calcium 8.7 - 10.2 mg/dL 9.5  BUN/Creatinine Ratio 9 - 20  19  eGFR >59 mL/min/1.73 67         Old records , labs and images have been reviewed.    ASSESSMENT / PLAN / RECOMMENDATIONS:   1) Type 2 Diabetes Mellitus, Poorly controlled, With macrovascular complications - Most recent A1c of 8.3 %. Goal A1c < 7.0 %.     - Not much improvement in glycemic control since his last visit  - I have counseled him about avoiding sugar-sweetened beverages  - Will increase insulin as below  - All glycemic agents have been refilled including pen needles    MEDICATIONS: Increase Lantus 50 units daily  Continue Farxiga 10 mg daily  Continue Trulicity 3 mg weekly   EDUCATION / INSTRUCTIONS: BG monitoring instructions: Patient is instructed to check his blood sugars 1 times a day, fasting . Call [facility] if: BG persistently < 70  I reviewed the Rule of 15 for the treatment of hypoglycemia in detail with the patient. Literature supplied.    2) Diabetic complications:  Eye: Does not have known diabetic retinopathy.  Neuro/ Feet: Does not have known diabetic peripheral neuropathy .  Renal: Patient does not have known baseline CKD. He   is  on an ACEI/ARB at present.      F/U in 6 months     Signed electronically by: Abby  Jaralla Shamleffer, MD  [facility]  [facility] [address] [place], [place] [address] Phone: [phone] FAX: [phone]   CC: Kremer, William Alfred, MD [address] [place] [address] Phone: [phone]  Fax: [phone]  Return to Endocrinology clinic as below: Future Appointments  Date Time Provider Department Center  09/08/2021  8:00 AM Kremer, William Alfred, MD [facility]  12/05/2021  7:15 AM [facility] DEVICE REMOTES [facility]  03/06/2022  7:15 AM [facility] DEVICE REMOTES [facility]  06/05/2022  7:15 AM [facility] DEVICE REMOTES [facility]  09/04/2022  7:15 AM [facility] DEVICE REMOTES [facility]      

## 2021-09-06 ENCOUNTER — Ambulatory Visit: Payer: Managed Care, Other (non HMO) | Admitting: Internal Medicine

## 2021-09-06 ENCOUNTER — Encounter: Payer: Self-pay | Admitting: Internal Medicine

## 2021-09-06 VITALS — BP 120/84 | HR 85 | Ht 70.0 in | Wt 172.6 lb

## 2021-09-06 DIAGNOSIS — E1165 Type 2 diabetes mellitus with hyperglycemia: Secondary | ICD-10-CM

## 2021-09-06 DIAGNOSIS — E1159 Type 2 diabetes mellitus with other circulatory complications: Secondary | ICD-10-CM

## 2021-09-06 DIAGNOSIS — Z794 Long term (current) use of insulin: Secondary | ICD-10-CM

## 2021-09-06 LAB — POCT GLYCOSYLATED HEMOGLOBIN (HGB A1C): Hemoglobin A1C: 8.3 % — AB (ref 4.0–5.6)

## 2021-09-06 LAB — POCT GLUCOSE (DEVICE FOR HOME USE): POC Glucose: 170 mg/dl — AB (ref 70–99)

## 2021-09-06 MED ORDER — FARXIGA 10 MG PO TABS: 10.0000 mg | ORAL_TABLET | Freq: Every day | ORAL | 3 refills | Status: DC

## 2021-09-06 MED ORDER — LANTUS SOLOSTAR 100 UNIT/ML ~~LOC~~ SOPN: 50.0000 [IU] | PEN_INJECTOR | SUBCUTANEOUS | 3 refills | Status: DC

## 2021-09-06 MED ORDER — PEN NEEDLES 32G X 4 MM MISC: 1.0000 | Freq: Every day | 3 refills | Status: DC

## 2021-09-06 MED ORDER — TRULICITY 3 MG/0.5ML ~~LOC~~ SOAJ: 3.0000 mg | SUBCUTANEOUS | 3 refills | Status: DC

## 2021-09-06 NOTE — Patient Instructions (Addendum)
Continue Farxiga 10 mg, 1 tablet daily  Continue Trulicity 3 mg once weekly  Increase Lantus to 50 units daily      HOW TO TREAT LOW BLOOD SUGARS (Blood sugar LESS THAN 70 MG/DL) Please follow the RULE OF 15 for the treatment of hypoglycemia treatment (when your (blood sugars are less than 70 mg/dL)   STEP 1: Take 15 grams of carbohydrates when your blood sugar is low, which includes:  3-4 GLUCOSE TABS  OR 3-4 OZ OF JUICE OR REGULAR SODA OR ONE TUBE OF GLUCOSE GEL    STEP 2: RECHECK blood sugar in 15 MINUTES STEP 3: If your blood sugar is still low at the 15 minute recheck --> then, go back to STEP 1 and treat AGAIN with another 15 grams of carbohydrates.

## 2021-09-08 ENCOUNTER — Encounter: Payer: Self-pay | Admitting: Family Medicine

## 2021-09-08 ENCOUNTER — Ambulatory Visit (INDEPENDENT_AMBULATORY_CARE_PROVIDER_SITE_OTHER): Payer: Managed Care, Other (non HMO) | Admitting: Family Medicine

## 2021-09-08 VITALS — BP 110/72 | HR 76 | Temp 97.1°F | Ht 70.0 in | Wt 172.6 lb

## 2021-09-08 DIAGNOSIS — N5201 Erectile dysfunction due to arterial insufficiency: Secondary | ICD-10-CM | POA: Diagnosis not present

## 2021-09-08 DIAGNOSIS — I5022 Chronic systolic (congestive) heart failure: Secondary | ICD-10-CM | POA: Diagnosis not present

## 2021-09-08 DIAGNOSIS — M25561 Pain in right knee: Secondary | ICD-10-CM

## 2021-09-08 DIAGNOSIS — Z23 Encounter for immunization: Secondary | ICD-10-CM

## 2021-09-08 DIAGNOSIS — E7800 Pure hypercholesterolemia, unspecified: Secondary | ICD-10-CM | POA: Diagnosis not present

## 2021-09-08 DIAGNOSIS — G8929 Other chronic pain: Secondary | ICD-10-CM

## 2021-09-08 DIAGNOSIS — M25562 Pain in left knee: Secondary | ICD-10-CM

## 2021-09-08 LAB — BASIC METABOLIC PANEL
BUN: 22 mg/dL (ref 6–23)
CO2: 28 mEq/L (ref 19–32)
Calcium: 9.5 mg/dL (ref 8.4–10.5)
Chloride: 103 mEq/L (ref 96–112)
Creatinine, Ser: 1.42 mg/dL (ref 0.40–1.50)
GFR: 53.79 mL/min — ABNORMAL LOW (ref >60.00)
Glucose, Bld: 188 mg/dL — ABNORMAL HIGH (ref 70–99)
Potassium: 4.5 mEq/L (ref 3.5–5.1)
Sodium: 138 mEq/L (ref 135–145)

## 2021-09-08 LAB — URINALYSIS, ROUTINE W REFLEX MICROSCOPIC
Bilirubin Urine: NEGATIVE
Hgb urine dipstick: NEGATIVE
Ketones, ur: NEGATIVE
Leukocytes,Ua: NEGATIVE
Nitrite: NEGATIVE
RBC / HPF: NONE SEEN (ref 0–?)
Specific Gravity, Urine: 1.02 (ref 1.000–1.030)
Total Protein, Urine: NEGATIVE
Urine Glucose: >=1000 — AB
Urobilinogen, UA: 0.2 (ref 0.0–1.0)
WBC, UA: NONE SEEN (ref 0–?)
pH: 6 (ref 5.0–8.0)

## 2021-09-08 LAB — LIPID PANEL
Cholesterol: 121 mg/dL (ref 0–200)
HDL: 53.8 mg/dL (ref >39.00)
LDL Cholesterol: 44 mg/dL (ref 0–99)
NonHDL: 66.77
Total CHOL/HDL Ratio: 2
Triglycerides: 115 mg/dL (ref 0.0–149.0)
VLDL: 23 mg/dL (ref 0.0–40.0)

## 2021-09-08 MED ORDER — SILDENAFIL CITRATE 100 MG PO TABS: 50.0000 mg | ORAL_TABLET | Freq: Every day | ORAL | 2 refills | Status: DC | PRN

## 2021-09-08 MED ORDER — SPIRONOLACTONE 25 MG PO TABS: 12.5000 mg | ORAL_TABLET | Freq: Every day | ORAL | 3 refills | Status: DC

## 2021-09-08 NOTE — Progress Notes (Signed)
Established Patient Office Visit  Subjective   Patient ID: John [name], male    DOB: [birth year]  Age: 61 y.o. MRN: [id number]  Chief Complaint  Patient presents with   Follow-up    6 month follow up, no concerns. Patient fasting.     HPI follow-up of elevated ldl cholesterol chronic end-stage arthrosis of both knees, flu vaccination and refill for Aldactone chronic heart failure.  Recent implantation of defibrillator.  It has not gone off yet.  Was told that it is working well.  No regular dental care.  He has upper and lower plates.  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  Voltaren gel continues to be helpful for his knees.  Continues to be mindful of his diet and keeping the weight off.  He is putting off surgery as long possible.  Lipids well controlled with John [name].    Review of Systems  Constitutional: Negative.   HENT: Negative.    Eyes:  Negative for blurred vision, discharge and redness.  Respiratory: Negative.    Cardiovascular: Negative.   Gastrointestinal:  Negative for abdominal pain.  Genitourinary: Negative.   Musculoskeletal:  Positive for joint pain. Negative for myalgias.  Skin:  Negative for rash.  Neurological:  Negative for tingling, loss of consciousness and weakness.  Endo/Heme/Allergies:  Negative for polydipsia.      Objective:     BP 110/72 (BP Location: Right Arm, Patient Position: Sitting, Cuff Size: Normal)   Pulse 76   Temp (!) 97.1 F (36.2 C) (Temporal)   Ht '5\' 10"'$  (1.778 m)   Wt 172 lb 9.6 oz (78.3 kg)   SpO2 98%   BMI 24.77 kg/m    Physical Exam Constitutional:      General: He is not in acute distress.    Appearance: Normal appearance. He is not ill-appearing, toxic-appearing or diaphoretic.  HENT:     Head: Normocephalic and atraumatic.     Right Ear: External ear normal.     Left Ear: External ear normal.     Mouth/Throat:     Mouth: Mucous membranes are moist.     Pharynx: Oropharynx is clear. No  oropharyngeal exudate or posterior oropharyngeal erythema.  Eyes:     General: No scleral icterus.       Right eye: No discharge.        Left eye: No discharge.     Extraocular Movements: Extraocular movements intact.     Conjunctiva/sclera: Conjunctivae normal.     Pupils: Pupils are equal, round, and reactive to light.  Cardiovascular:     Rate and Rhythm: Normal rate and regular rhythm.  Pulmonary:     Effort: Pulmonary effort is normal. No respiratory distress.     Breath sounds: Normal breath sounds.  Musculoskeletal:     Cervical back: No rigidity or tenderness.  Skin:    General: Skin is warm and dry.  Neurological:     Mental Status: He is alert and oriented to person, place, and time.  Psychiatric:        Mood and Affect: Mood normal.        Behavior: Behavior normal.      No results found for any visits on 09/08/21.    The ASCVD Risk score (Arnett DK, et al., 2019) failed to calculate for the following reasons:   The patient has a prior MI or stroke diagnosis    Assessment & Plan:   Problem List Items Addressed  This Visit       Cardiovascular and Mediastinum   Chronic systolic CHF (congestive heart failure) (HCC)   Relevant Medications   spironolactone (ALDACTONE) 25 MG tablet   sildenafil (VIAGRA) 100 MG tablet   Other Relevant Orders   Basic metabolic panel   Urinalysis, Routine w reflex microscopic   Lipid panel     Other   Elevated LDL cholesterol level   Relevant Orders   Lipid panel   Chronic pain of both knees   Need for influenza vaccination - Primary   Relevant Orders   Flu Vaccine QUAD 6+ mos PF IM (Fluarix Quad PF) (Completed)   Other Visit Diagnoses     Erectile dysfunction due to arterial insufficiency       Relevant Medications   spironolactone (ALDACTONE) 25 MG tablet   sildenafil (VIAGRA) 100 MG tablet       Return in about 6 months (around 03/09/2022).  Continue current medications and follow-up  John Maw,  MD

## 2021-09-27 NOTE — Progress Notes (Signed)
Remote ICD transmission.   

## 2021-10-06 ENCOUNTER — Encounter: Payer: Self-pay | Admitting: Family Medicine

## 2021-10-08 ENCOUNTER — Other Ambulatory Visit: Payer: Self-pay | Admitting: Family Medicine

## 2021-10-08 DIAGNOSIS — I2510 Atherosclerotic heart disease of native coronary artery without angina pectoris: Secondary | ICD-10-CM

## 2021-10-08 DIAGNOSIS — E7800 Pure hypercholesterolemia, unspecified: Secondary | ICD-10-CM

## 2021-11-21 ENCOUNTER — Telehealth: Payer: Self-pay | Admitting: Pharmacist

## 2021-11-21 NOTE — Telephone Encounter (Signed)
PA for Repatha submitted.  Key: [id number]

## 2021-11-21 NOTE — Telephone Encounter (Signed)
PA  approved through 11/22/22

## 2021-12-05 ENCOUNTER — Ambulatory Visit (INDEPENDENT_AMBULATORY_CARE_PROVIDER_SITE_OTHER): Payer: Managed Care, Other (non HMO)

## 2021-12-05 DIAGNOSIS — I255 Ischemic cardiomyopathy: Secondary | ICD-10-CM

## 2021-12-05 LAB — CUP PACEART REMOTE DEVICE CHECK
Battery Remaining Longevity: 180 mo
Battery Remaining Percentage: 100 %
Brady Statistic RV Percent Paced: 0 %
Date Time Interrogation Session: 20231205041100
HighPow Impedance: 80 Ohm
Implantable Lead Connection Status: 753985
Implantable Lead Implant Date: 20230602
Implantable Lead Location: 753860
Implantable Lead Model: 138
Implantable Lead Serial Number: 303305
Implantable Pulse Generator Implant Date: 20230602
Lead Channel Impedance Value: 698 Ohm
Lead Channel Pacing Threshold Amplitude: 0.4 V
Lead Channel Pacing Threshold Pulse Width: 0.4 ms
Lead Channel Setting Pacing Amplitude: 2.5 V
Lead Channel Setting Pacing Pulse Width: 0.4 ms
Lead Channel Setting Sensing Sensitivity: 0.5 mV
Pulse Gen Serial Number: 217470

## 2021-12-09 ENCOUNTER — Other Ambulatory Visit: Payer: Self-pay | Admitting: Cardiology

## 2021-12-28 ENCOUNTER — Other Ambulatory Visit: Payer: Self-pay | Admitting: Cardiology

## 2021-12-28 DIAGNOSIS — I255 Ischemic cardiomyopathy: Secondary | ICD-10-CM

## 2022-01-02 NOTE — Progress Notes (Signed)
Remote ICD transmission.   

## 2022-03-06 ENCOUNTER — Ambulatory Visit (INDEPENDENT_AMBULATORY_CARE_PROVIDER_SITE_OTHER): Payer: Managed Care, Other (non HMO)

## 2022-03-06 DIAGNOSIS — I255 Ischemic cardiomyopathy: Secondary | ICD-10-CM

## 2022-03-07 LAB — CUP PACEART REMOTE DEVICE CHECK
Battery Remaining Longevity: 162 mo
Battery Remaining Percentage: 100 %
Brady Statistic RV Percent Paced: 0 %
Date Time Interrogation Session: 20240305041000
HighPow Impedance: 79 Ohm
Implantable Lead Connection Status: 753985
Implantable Lead Implant Date: 20230602
Implantable Lead Location: 753860
Implantable Lead Model: 138
Implantable Lead Serial Number: 303305
Implantable Pulse Generator Implant Date: 20230602
Lead Channel Impedance Value: 620 Ohm
Lead Channel Pacing Threshold Amplitude: 0.5 V
Lead Channel Pacing Threshold Pulse Width: 0.4 ms
Lead Channel Setting Pacing Amplitude: 2.5 V
Lead Channel Setting Pacing Pulse Width: 0.4 ms
Lead Channel Setting Sensing Sensitivity: 0.5 mV
Pulse Gen Serial Number: 217470

## 2022-03-08 ENCOUNTER — Encounter: Payer: Self-pay | Admitting: Internal Medicine

## 2022-03-08 ENCOUNTER — Ambulatory Visit: Payer: Managed Care, Other (non HMO) | Admitting: Internal Medicine

## 2022-03-08 VITALS — BP 124/80 | HR 78 | Ht 70.0 in | Wt 169.0 lb

## 2022-03-08 DIAGNOSIS — E1159 Type 2 diabetes mellitus with other circulatory complications: Secondary | ICD-10-CM

## 2022-03-08 DIAGNOSIS — E1122 Type 2 diabetes mellitus with diabetic chronic kidney disease: Secondary | ICD-10-CM | POA: Diagnosis not present

## 2022-03-08 DIAGNOSIS — Z794 Long term (current) use of insulin: Secondary | ICD-10-CM | POA: Diagnosis not present

## 2022-03-08 DIAGNOSIS — E1165 Type 2 diabetes mellitus with hyperglycemia: Secondary | ICD-10-CM | POA: Diagnosis not present

## 2022-03-08 DIAGNOSIS — N1831 Chronic kidney disease, stage 3a: Secondary | ICD-10-CM

## 2022-03-08 LAB — POCT GLYCOSYLATED HEMOGLOBIN (HGB A1C): Hemoglobin A1C: 10.9 % — AB (ref 4.0–5.6)

## 2022-03-08 LAB — POCT GLUCOSE (DEVICE FOR HOME USE): POC Glucose: 131 mg/dl — AB (ref 70–99)

## 2022-03-08 MED ORDER — LANTUS SOLOSTAR 100 UNIT/ML ~~LOC~~ SOPN: 50.0000 [IU] | PEN_INJECTOR | SUBCUTANEOUS | 3 refills | Status: DC

## 2022-03-08 MED ORDER — DEXCOM G7 SENSOR MISC: 1.0000 | 3 refills | Status: DC

## 2022-03-08 MED ORDER — PEN NEEDLES 32G X 4 MM MISC: 1.0000 | Freq: Every day | 3 refills | Status: DC

## 2022-03-08 MED ORDER — TIRZEPATIDE 5 MG/0.5ML ~~LOC~~ SOAJ: 5.0000 mg | SUBCUTANEOUS | 3 refills | Status: DC

## 2022-03-08 MED ORDER — FARXIGA 10 MG PO TABS: 10.0000 mg | ORAL_TABLET | Freq: Every day | ORAL | 3 refills | Status: DC

## 2022-03-08 NOTE — Progress Notes (Signed)
Name: John [name]  Age/ Sex: 62 y.o., male   MRN/ DOB: [id number], 1960/11/03     PCP: Libby Maw, MD   Reason for Endocrinology Evaluation: Type 2 Diabetes Mellitus  Initial Endocrine Consultative Visit: 12/11/2017    PATIENT IDENTIFIER: [name] is a 62 y.o. male with a past medical history of T2DM, Hx CVA, CHF and CAD. The patient has followed with Endocrinology clinic since 12/11/20219 for consultative assistance with management of his diabetes.  DIABETIC HISTORY:  Mr. Kliethermes was diagnosed with DM 2004, and started insulin therapy approximately 2012 . His hemoglobin A1c has ranged from 6.3% in 2022, peaking at 8.4% in 23.    He was followed up by Dr. Loanne Drilling between 2019 until March 2023  SUBJECTIVE:   During the last visit (09/06/2021): A1c 8.3%     Today (03/08/2022): Mr. Kump  He checks his blood sugars occasionally  times daily. The patient has not had hypoglycemic episodes since the last clinic visit   Patient is s/p ICD placement on 06/02/2021  He has been drinking mountain dews  Denies nausea, vomiting or diarrhea     HOME DIABETES REGIMEN:  Farxiga 10 mg daily Trulicity 3 mg weekly ( Sundays)  Lantus 50 units daily      Statin: no, patient on Repatha ACE-I/ARB: Yes    METER DOWNLOAD SUMMARY: Did not bring     DIABETIC COMPLICATIONS: Microvascular complications:  CKD III Denies: neuropathy, retinopathy  Last Eye Exam: Completed 08/2021  Macrovascular complications:  CVA ([id number]), CAD Denies: PVD   HISTORY:  Past Medical History:  Past Medical History:  Diagnosis Date   Arthritis    Diabetes mellitus without complication ([place])    Hyperlipidemia    Hypertension    Stroke ([place]) 05/2020   Past Surgical History:  Past Surgical History:  Procedure Laterality Date   ICD IMPLANT N/A 06/02/2021   Procedure: ICD IMPLANT;  Surgeon: Deboraha Sprang, MD;  Location: [facility];  Service: Cardiovascular;   Laterality: N/A;   LEFT HEART CATH AND CORONARY ANGIOGRAPHY N/A 08/18/2020   Procedure: LEFT HEART CATH AND CORONARY ANGIOGRAPHY;  Surgeon: Martinique, Peter M, MD;  Location: [facility];  Service: Cardiovascular;  Laterality: N/A;   TONSILLECTOMY     Social History:  reports that he has quit smoking. He has never used smokeless tobacco. He reports current alcohol use. He reports that he does not use drugs. Family History:  Family History  Problem Relation Age of Onset   Diabetes Mother    CAD Mother    Heart attack Mother        x 2   COPD Father    Colon cancer Neg Hx    Esophageal cancer Neg Hx    Rectal cancer Neg Hx    Stomach cancer Neg Hx      HOME MEDICATIONS: Allergies as of 03/08/2022   No Known Allergies      Medication List        Accurate as of March 08, 2022  7:38 AM. If you have any questions, ask your nurse or doctor.          acetaminophen 500 MG tablet Commonly known as: TYLENOL Take 500 mg by mouth every 6 (six) hours as needed for moderate pain or headache.   BC FAST PAIN RELIEF PO Take 1 packet by mouth daily as needed (pain).   carvedilol 12.5 MG tablet Commonly known as: COREG Take 1  tablet (12.5 mg total) by mouth 2 (two) times daily with a meal.   Debrox 6.5 % OTIC solution Generic drug: carbamide peroxide Place 5 drops into both ears 2 (two) times daily. What changed:  when to take this reasons to take this   diclofenac Sodium 1 % Gel Commonly known as: Voltaren Rub a small grape sized dollop into knee up to 3 times daily as needed.   Eliquis 5 MG Tabs tablet Generic drug: apixaban TAKE ONE TABLET BY MOUTH TWICE A DAY   Entresto 24-26 MG Generic drug: sacubitril-valsartan TAKE ONE TABLET BY MOUTH TWICE A DAY   ezetimibe 10 MG tablet Commonly known as: ZETIA TAKE ONE TABLET BY MOUTH DAILY   Farxiga 10 MG Tabs tablet Generic drug: dapagliflozin propanediol Take 1 tablet (10 mg total) by mouth daily.   glucose blood  test strip 1 each by Other route 2 (two) times daily. And lancets 2/day   Lantus SoloStar 100 UNIT/ML Solostar Pen Generic drug: insulin glargine Inject 50 Units into the skin every morning. And pen needles 1/day   Pen Needles 32G X 4 MM Misc 1 Package by Does not apply route daily in the afternoon.   Repatha SureClick XX123456 MG/ML Soaj Generic drug: Evolocumab INJECT '140MG'$  INTO THE SKIN EVERY 14 DAYS   sildenafil 100 MG tablet Commonly known as: Viagra Take 0.5-1 tablets (50-100 mg total) by mouth daily as needed for erectile dysfunction.   spironolactone 25 MG tablet Commonly known as: Aldactone Take 0.5 tablets (12.5 mg total) by mouth daily.   Trulicity 3 0000000 Sopn Generic drug: Dulaglutide Inject 3 mg as directed once a week.         OBJECTIVE:   Vital Signs: BP 124/80 (BP Location: Right Arm, Patient Position: Sitting, Cuff Size: Small)   Pulse 78   Ht '5\' 10"'$  (1.778 m)   Wt 169 lb (76.7 kg)   SpO2 99%   BMI 24.25 kg/m   Wt Readings from Last 3 Encounters:  03/08/22 169 lb (76.7 kg)  09/08/21 172 lb 9.6 oz (78.3 kg)  09/06/21 172 lb 9.6 oz (78.3 kg)     Exam: General: Pt appears well and is in NAD  Neck: General: Supple without adenopathy. Thyroid: Thyroid size normal.  No goiter or nodules appreciated.   Lungs: Clear with good BS bilat   Heart: RRR   Extremities: No pretibial edema.   Neuro: MS is good with appropriate affect, pt is alert and Ox3    DM foot exam: 09/06/2021  The skin of the feet is  without sores or ulcerations, dystrophic nails  The pedal pulses are 1+ on right and 1+ on left. The sensation is intact to a screening 5.07, 10 gram monofilament bilaterally          DATA REVIEWED:  Lab Results  Component Value Date   HGBA1C 10.9 (A) 03/08/2022   HGBA1C 8.3 (A) 09/06/2021   HGBA1C 8.4 (A) 03/31/2021    Latest Reference Range & Units 09/08/21 08:42  Sodium 135 - 145 mEq/L 138  Potassium 3.5 - 5.1 mEq/L 4.5  Chloride 96 -  112 mEq/L 103  CO2 19 - 32 mEq/L 28  Glucose 70 - 99 mg/dL 188 (H)  BUN 6 - 23 mg/dL 22  Creatinine 0.40 - 1.50 mg/dL 1.42  Calcium 8.4 - 10.5 mg/dL 9.5  GFR >60.00 mL/min 53.79 (L)  Total CHOL/HDL Ratio  2  Cholesterol 0 - 200 mg/dL 121  HDL Cholesterol >39.00 mg/dL 53.80  LDL (calc) 0 - 99 mg/dL 44  NonHDL  66.77  Triglycerides 0.0 - 149.0 mg/dL 115.0  VLDL 0.0 - 40.0 mg/dL 23.0      ASSESSMENT / PLAN / RECOMMENDATIONS:   1) Type 2 Diabetes Mellitus, Poorly controlled, With CKD III and macrovascular complications - Most recent A1c of 10.9 %. Goal A1c < 7.0 %.     - Poorly controlled diabetes due to mountain dew, we again discussed the importance of low carb diet and avoiding sugar-sweetened beverages  - Will switch Trulicity to [employer]  - Dexcom sensor and prescription was provided   MEDICATIONS: Continue Lantus 50 units daily  Continue Farxiga 10 mg daily  Switch Trulicity to mounjaro 5 mg weekly   EDUCATION / INSTRUCTIONS: BG monitoring instructions: Patient is instructed to check his blood sugars 1 times a day, fasting . Call [facility] if: BG persistently < 70  I reviewed the Rule of 15 for the treatment of hypoglycemia in detail with the patient. Literature supplied.    2) Diabetic complications:  Eye: Does not have known diabetic retinopathy.  Neuro/ Feet: Does not have known diabetic peripheral neuropathy .  Renal: Patient does have known baseline CKD. He   is  on an ACEI/ARB at present.      F/U in 3 months     Signed electronically by: Mack Guise, MD  [facility]  [facility] [address] [place], [place] [address] Phone: [phone] FAX: [phone]   CC: Libby Maw, [place] [address] Phone: [phone]  Fax: [phone]  Return to Endocrinology clinic as below: Future Appointments  Date Time Provider [place]   06/05/2022  7:15 AM [facility] DEVICE REMOTES [facility]  09/04/2022  7:15 AM [facility] DEVICE REMOTES [facility]

## 2022-03-08 NOTE — Patient Instructions (Signed)
Switch Trulicity to [employer] 5 mg once weekly  Continue Farxiga 10 mg, 1 tablet daily  Continue Lantus  50 units daily      HOW TO TREAT LOW BLOOD SUGARS (Blood sugar LESS THAN 70 MG/DL) Please follow the RULE OF 15 for the treatment of hypoglycemia treatment (when your (blood sugars are less than 70 mg/dL)   STEP 1: Take 15 grams of carbohydrates when your blood sugar is low, which includes:  3-4 GLUCOSE TABS  OR 3-4 OZ OF JUICE OR REGULAR SODA OR ONE TUBE OF GLUCOSE GEL    STEP 2: RECHECK blood sugar in 15 MINUTES STEP 3: If your blood sugar is still low at the 15 minute recheck --> then, go back to STEP 1 and treat AGAIN with another 15 grams of carbohydrates.

## 2022-04-11 NOTE — Progress Notes (Signed)
Remote ICD transmission.   

## 2022-06-05 ENCOUNTER — Ambulatory Visit: Payer: Managed Care, Other (non HMO)

## 2022-06-05 DIAGNOSIS — I255 Ischemic cardiomyopathy: Secondary | ICD-10-CM

## 2022-06-05 DIAGNOSIS — I5022 Chronic systolic (congestive) heart failure: Secondary | ICD-10-CM

## 2022-06-05 LAB — CUP PACEART REMOTE DEVICE CHECK
Battery Remaining Longevity: 180 mo
Battery Remaining Percentage: 100 %
Brady Statistic RV Percent Paced: 0 %
Date Time Interrogation Session: 20240604051600
HighPow Impedance: 76 Ohm
Implantable Lead Connection Status: 753985
Implantable Lead Implant Date: 20230602
Implantable Lead Location: 753860
Implantable Lead Model: 138
Implantable Lead Serial Number: 303305
Implantable Pulse Generator Implant Date: 20230602
Lead Channel Impedance Value: 644 Ohm
Lead Channel Pacing Threshold Amplitude: 0.5 V
Lead Channel Pacing Threshold Pulse Width: 0.4 ms
Lead Channel Setting Pacing Amplitude: 2.5 V
Lead Channel Setting Pacing Pulse Width: 0.4 ms
Lead Channel Setting Sensing Sensitivity: 0.5 mV
Pulse Gen Serial Number: 217470

## 2022-06-06 ENCOUNTER — Ambulatory Visit: Payer: Managed Care, Other (non HMO) | Admitting: Internal Medicine

## 2022-06-07 ENCOUNTER — Ambulatory Visit: Payer: Managed Care, Other (non HMO) | Attending: Nurse Practitioner | Admitting: Nurse Practitioner

## 2022-06-07 ENCOUNTER — Encounter: Payer: Self-pay | Admitting: Nurse Practitioner

## 2022-06-07 VITALS — BP 116/72 | HR 98 | Ht 69.0 in | Wt 169.4 lb

## 2022-06-07 DIAGNOSIS — E785 Hyperlipidemia, unspecified: Secondary | ICD-10-CM

## 2022-06-07 DIAGNOSIS — I255 Ischemic cardiomyopathy: Secondary | ICD-10-CM | POA: Diagnosis not present

## 2022-06-07 DIAGNOSIS — I2510 Atherosclerotic heart disease of native coronary artery without angina pectoris: Secondary | ICD-10-CM | POA: Diagnosis not present

## 2022-06-07 DIAGNOSIS — I10 Essential (primary) hypertension: Secondary | ICD-10-CM

## 2022-06-07 DIAGNOSIS — Z794 Long term (current) use of insulin: Secondary | ICD-10-CM

## 2022-06-07 DIAGNOSIS — E1165 Type 2 diabetes mellitus with hyperglycemia: Secondary | ICD-10-CM

## 2022-06-07 DIAGNOSIS — Z8673 Personal history of transient ischemic attack (TIA), and cerebral infarction without residual deficits: Secondary | ICD-10-CM

## 2022-06-07 NOTE — Patient Instructions (Addendum)
Medication Instructions:  Your physician recommends that you continue on your current medications as directed. Please refer to the Current Medication list given to you today.   *If you need a refill on your cardiac medications before your next appointment, please call your pharmacy*   Lab Work: NONE ordered at this time of appointment    Testing/Procedures: NONE ordered at this time of appointment     Follow-Up: At [facility], you and your health needs are our priority.  As part of our continuing mission to provide you with exceptional heart care, we have created designated Provider Care Teams.  These Care Teams include your primary Cardiologist (physician) and Advanced Practice Providers (APPs -  Physician Assistants and Nurse Practitioners) who all work together to provide you with the care you need, when you need it.  We recommend signing up for the patient portal called "MyChart".  Sign up information is provided on this After Visit Summary.  MyChart is used to connect with patients for Virtual Visits (Telemedicine).  Patients are able to view lab/test results, encounter notes, upcoming appointments, etc.  Non-urgent messages can be sent to your provider as well.   To learn more about what you can do with MyChart, go to [email or web address].    Your next appointment:   1-2 months (Dr. Graciela Husbands) 1 Year (Dr. Jens Som) Provider:   Dr. Jens Som   Other Instructions

## 2022-06-07 NOTE — Progress Notes (Signed)
Office Visit    Patient Name: John [name] Date of Encounter: 06/07/2022  Primary Care Provider:  Mliss Sax, MD Primary Cardiologist:  Olga Millers, MD  Chief Complaint    62 year old male with a history of CAD managed medically, ICM s/p ICD, hypertension, hyperlipidemia, CVA, type 2 diabetes, and arthritis who presents for follow-up related to CAD and ICM.   Past Medical History    Past Medical History:  Diagnosis Date   Arthritis    Diabetes mellitus without complication (HCC)    Hyperlipidemia    Hypertension    Stroke (HCC) 05/2020   Past Surgical History:  Procedure Laterality Date   ICD IMPLANT N/A 06/02/2021   Procedure: ICD IMPLANT;  Surgeon: Duke Salvia, MD;  Location: [facility] INVASIVE CV LAB;  Service: Cardiovascular;  Laterality: N/A;   LEFT HEART CATH AND CORONARY ANGIOGRAPHY N/A 08/18/2020   Procedure: LEFT HEART CATH AND CORONARY ANGIOGRAPHY;  Surgeon: [place], Peter M, MD;  Location: [facility] INVASIVE CV LAB;  Service: Cardiovascular;  Laterality: N/A;   TONSILLECTOMY      Allergies  No Known Allergies   Labs/Other Studies Reviewed    The following studies were reviewed today:  Cardiac Studies & Procedures   CARDIAC CATHETERIZATION  CARDIAC CATHETERIZATION 08/18/2020  Narrative   Ost LM lesion is 30% stenosed.   Prox LAD lesion is 99% stenosed.   1st Mrg lesion is 80% stenosed.   RPDA-1 lesion is 80% stenosed.   RPDA-2 lesion is 90% stenosed.   LV end diastolic pressure is mildly elevated.   There is no aortic valve stenosis.  3 vessel obstructive CAD -Subtotal proximal LAD CTO with some recanalization and collaterals. The LAD is diffusely small and underfilled - 80% proximal large OM1 - tandem PDA lesions 80% ostial and 90% proximal 2. Mildly elevated LVEDP 22 mm Hg  Plan: By Ecg and Echo criteria the LAD territory appears nonviable. The OM and PDA disease could potentially be causing ischemia but patient is asymptomatic. Will  discuss findings with primary cardiologist.  Findings Coronary Findings Diagnostic  Dominance: Right  Left Main Vessel was injected. Vessel is normal in caliber. Ost LM lesion is 30% stenosed.  Left Anterior Descending Vessel was injected. Vessel is small. Collaterals Mid LAD filled by collaterals from Ost LAD.  Prox LAD lesion is 99% stenosed. The lesion is chronically occluded with bridging collateral flow.  Left Circumflex  First Obtuse Marginal Branch Vessel is large in size. 1st Mrg lesion is 80% stenosed.  Right Coronary Artery  Right Posterior Descending Artery RPDA-1 lesion is 80% stenosed. RPDA-2 lesion is 90% stenosed.  Right Posterior Atrioventricular Artery  Intervention  No interventions have been documented.     ECHOCARDIOGRAM  ECHOCARDIOGRAM COMPLETE 03/10/2021  Narrative ECHOCARDIOGRAM REPORT    Patient Name:   John [name] Date of Exam: 03/10/2021 Medical Rec #:  [id number]          Height:       70.0 in Accession #:    [id number]         Weight:       171.2 lb Date of Birth:  1960-08-19          BSA:          1.954 m Patient Age:    60 years           BP:           116/70 mmHg Patient Gender: M  HR:           78 bpm. Exam Location:  [address]  Procedure: 2D Echo, Cardiac Doppler, Color Doppler and Intracardiac Opacification Agent  Indications:    I25.5 Ischemic cardiomyopathy  History:        Patient has prior history of Echocardiogram examinations, most recent 06/02/2020. CHF, CAD, Stroke; Risk Factors:Hypertension, Diabetes and Dyslipidemia.  Sonographer:    Cathie Beams RCS Referring Phys: [phone] BRIAN S CRENSHAW  IMPRESSIONS   1. Left ventricular ejection fraction, by estimation, is 25 to 30%. The left ventricle has severely decreased function. The left ventricle has no regional wall motion abnormalities. There is moderate concentric left ventricular hypertrophy. Left ventricular diastolic parameters are  consistent with Grade I diastolic dysfunction (impaired relaxation). 2. Right ventricular systolic function is normal. The right ventricular size is normal. Tricuspid regurgitation signal is inadequate for assessing PA pressure. 3. The mitral valve is normal in structure. Trivial mitral valve regurgitation. No evidence of mitral stenosis. 4. The aortic valve is tricuspid. Aortic valve regurgitation is not visualized. Aortic valve sclerosis/calcification is present, without any evidence of aortic stenosis. 5. There is mild dilatation of the aortic root, measuring 37 mm. There is mild dilatation of the ascending aorta, measuring 37 mm. 6. The inferior vena cava is normal in size with greater than 50% respiratory variability, suggesting right atrial pressure of 3 mmHg.  FINDINGS Left Ventricle: Left ventricular ejection fraction, by estimation, is 25 to 30%. The left ventricle has severely decreased function. The left ventricle has no regional wall motion abnormalities. The left ventricular internal cavity size was normal in size. There is moderate concentric left ventricular hypertrophy. Left ventricular diastolic parameters are consistent with Grade I diastolic dysfunction (impaired relaxation).   LV Wall Scoring: The mid and distal inferior wall, mid inferoseptal segment, apical septal segment, and apex are akinetic. The entire anterior wall, entire lateral wall, anterior septum, basal inferior segment, and basal inferoseptal segment are hypokinetic.  Right Ventricle: The right ventricular size is normal. No increase in right ventricular wall thickness. Right ventricular systolic function is normal. Tricuspid regurgitation signal is inadequate for assessing PA pressure.  Left Atrium: Left atrial size was normal in size.  Right Atrium: Right atrial size was normal in size.  Pericardium: There is no evidence of pericardial effusion.  Mitral Valve: The mitral valve is normal in structure.  Trivial mitral valve regurgitation. No evidence of mitral valve stenosis.  Tricuspid Valve: The tricuspid valve is normal in structure. Tricuspid valve regurgitation is not demonstrated. No evidence of tricuspid stenosis.  Aortic Valve: The aortic valve is tricuspid. Aortic valve regurgitation is not visualized. Aortic valve sclerosis/calcification is present, without any evidence of aortic stenosis.  Pulmonic Valve: The pulmonic valve was normal in structure. Pulmonic valve regurgitation is not visualized. No evidence of pulmonic stenosis.  Aorta: There is mild dilatation of the aortic root, measuring 37 mm. There is mild dilatation of the ascending aorta, measuring 37 mm.  Venous: The inferior vena cava is normal in size with greater than 50% respiratory variability, suggesting right atrial pressure of 3 mmHg.  IAS/Shunts: No atrial level shunt detected by color flow Doppler.   LEFT VENTRICLE PLAX 2D LVIDd:         4.10 cm   Diastology LVIDs:         3.70 cm   LV e' medial:    4.20 cm/s LV PW:         1.40 cm   LV E/e'  medial:  8.9 LV IVS:        1.60 cm   LV e' lateral:   4.33 cm/s LVOT diam:     2.10 cm   LV E/e' lateral: 8.6 LV SV:         47 LV SV Index:   24 LVOT Area:     3.46 cm   RIGHT VENTRICLE RV Basal diam:  2.10 cm RV S prime:     10.80 cm/s TAPSE (M-mode): 1.4 cm  LEFT ATRIUM             Index        RIGHT ATRIUM           Index LA diam:        3.70 cm 1.89 cm/m   RA Area:     12.50 cm LA Vol (A2C):   24.4 ml 12.49 ml/m  RA Volume:   30.30 ml  15.51 ml/m LA Vol (A4C):   39.7 ml 20.32 ml/m LA Biplane Vol: 33.5 ml 17.15 ml/m AORTIC VALVE LVOT Vmax:   77.40 cm/s LVOT Vmean:  45.300 cm/s LVOT VTI:    0.136 m  AORTA Ao Root diam: 3.70 cm Ao Asc diam:  3.70 cm  MITRAL VALVE MV Area (PHT): 4.63 cm    SHUNTS MV Decel Time: 164 msec    Systemic VTI:  0.14 m MV E velocity: 37.20 cm/s  Systemic Diam: 2.10 cm MV A velocity: 76.50 cm/s MV E/A ratio:   0.49  Kardie Tobb DO Electronically signed by Thomasene Ripple DO Signature Date/Time: 03/10/2021/3:01:25 PM    Final      CARDIAC MRI  MR CARDIAC MORPHOLOGY W WO CONTRAST 04/27/2021  Narrative CLINICAL DATA:  Assess for hypertrophic cardiomyopathy  EXAM: CARDIAC MRI  TECHNIQUE: The patient was scanned on a 1.5 Tesla GE magnet. A dedicated cardiac coil was used. Functional imaging was done using Fiesta sequences. 2,3, and 4 chamber views were done to assess for RWMA's. Modified Simpson's rule using a short axis stack was used to calculate an ejection fraction on a dedicated work [employer]. The patient received 8 cc of Gadavist. After 10 minutes inversion recovery sequences were used to assess for infiltration and scar tissue.  FINDINGS: Limited images of the lung fields showed no gross abnormalities.  Mildly dilated left ventricular size and wall thickness (mildly sigmoid septum but doubt of clinical significance). Mid to apical anterior akinesis, mid to apical anteroseptal and inferoseptal severe hypokinesis, severe hypokinesis of the true apex, severe hypokinesis of the apical inferior wall. LV EF 30%. No LV thrombus noted. Normal right ventricular size and systolic function, EF 56%. Normal left and right atrial size. Trileaflet aortic valve with no significant stenosis or regurgitation. No significant mitral regurgitation noted.  DELAYED ENHANCEMENT IMAGING: DELAYED ENHANCEMENT IMAGING Mid inferolateral wall with > 50% wall thickness subendocardial late gadolinium enhancement (LGE).  Mid to apical anteroseptal, inferoseptal, and anterior walls with < 50% wall thickness subendocardial LGE.  MEASUREMENTS: MEASUREMENTS LVEDV 216 mL  LVEDVi 109 mL/m2  LVSV 65 mL  LVEF 30%  RVEDV 107 mL  RVEDVi 54 mL/m2  RVSV 56 mL  RVEF 53%  IMPRESSION: 1. Mildly dilated left ventricle with wall motion abnormalities as noted above, LV EF 30%.  2.   Normal RV size and systolic function.  3. Delayed enhancement pattern suggests prior MIs in the LAD territory and LCx territory.  4.  I do not see definite evidence for hypertrophic cardiomyopathy.  Dalton  Mclean   Electronically Signed By: Marca Ancona M.D. On: 04/27/2021 18:52        Recent Labs: 09/08/2021: BUN 22; Creatinine, Ser 1.42; Potassium 4.5; Sodium 138  Recent Lipid Panel    Component Value Date/Time   CHOL 121 09/08/2021 0842   CHOL 82 (L) 02/27/2021 0812   TRIG 115.0 09/08/2021 0842   HDL 53.80 09/08/2021 0842   HDL 42 02/27/2021 0812   CHOLHDL 2 09/08/2021 0842   VLDL 23.0 09/08/2021 0842   LDLCALC 44 09/08/2021 0842   LDLCALC 28 02/27/2021 0812    History of Present Illness    62 year old male with the above past medical history including CAD managed medically, ICM s/p ICD, hypertension, hyperlipidemia, CVA, type 2 diabetes, and arthritis.  He was hospitalized in May 2022 in the setting of acute CVA.  Echocardiogram at the time showed echogenic region in the LV lateral apex which could possibly be an old thrombus versus prominent trabeculations in the setting of wall motion abnormalities, EF 40 to 45%, G1 DD.  Swelling at the LV apex was noted.  Cardiac catheterization in 08/2020 showed 90% pLAD, he reports current OM1, 80% PDA 1, 90% continue to stenoses.  The LAD was felt to be diffusely small, but OM and PDA potentially be causing ischemia but patient was not asymptomatic.  Inferior wall was felt to be nonviable, report, medical therapy was recommended.  Repeat echocardiogram in 03/2021 showed EF 25 to 30%, moderate LVH, G1 DD.  Cardiac MRI in 04/2021 revealed mild dilated left ventricle with wall motion abnormalities, EF 30%, normal RV size and systolic function, delayed enhancement pattern in the LAD and left circumflex territories, suggestive of prior MIs, no definite evidence for hypertrophic cardiomyopathy.  He subsequently underwent ICD implantation on  06/2021.  He was last seen in the office on 09/05/2021 by Dr. Graciela Husbands, EP and was stable from a cardiac standpoint.  His blood pressure was borderline low.  No medication changes were obtained.  He presents today for follow-up.  Since his last visit he has been stable from a cardiac standpoint.  He denies any symptoms concerning for angina, denies any dyspnea, edema, PND, orthopnea, weight gain.  Overall, he reports feeling well.  Home Medications    Current Outpatient Medications  Medication Sig Dispense Refill   acetaminophen (TYLENOL) 500 MG tablet Take 500 mg by mouth every 6 (six) hours as needed for moderate pain or headache.     apixaban (ELIQUIS) 5 MG TABS tablet TAKE ONE TABLET BY MOUTH TWICE A DAY 180 tablet 1   Aspirin-Caffeine (BC FAST PAIN RELIEF PO) Take 1 packet by mouth daily as needed (pain).     carbamide peroxide (DEBROX) 6.5 % OTIC solution Place 5 drops into both ears 2 (two) times daily. (Patient taking differently: Place 5 drops into both ears 2 (two) times daily as needed (wax removal).) 15 mL 2   carvedilol (COREG) 12.5 MG tablet Take 1 tablet (12.5 mg total) by mouth 2 (two) times daily with a meal. 180 tablet 3   Continuous Blood Gluc Sensor (DEXCOM G7 SENSOR) MISC 1 Device by Does not apply route as directed. 9 each 3   diclofenac Sodium (VOLTAREN) 1 % GEL Rub a small grape sized dollop into knee up to 3 times daily as needed. 350 g 1   ENTRESTO 24-26 MG TAKE ONE TABLET BY MOUTH TWICE A DAY 60 tablet 6   FARXIGA 10 MG TABS tablet Take 1 tablet (10 mg total) by  mouth daily. 90 tablet 3   glucose blood test strip 1 each by Other route 2 (two) times daily. And lancets 2/day 200 each 3   insulin glargine (LANTUS SOLOSTAR) 100 UNIT/ML Solostar Pen Inject 50 Units into the skin every morning. And pen needles 1/day 45 mL 3   Insulin Pen Needle (PEN NEEDLES) 32G X 4 MM MISC 1 Package by Does not apply route daily in the afternoon. 100 each 3   REPATHA SURECLICK 140 MG/ML SOAJ  INJECT 140MG  INTO THE SKIN EVERY 14 DAYS 2 mL 11   sildenafil (VIAGRA) 100 MG tablet Take 0.5-1 tablets (50-100 mg total) by mouth daily as needed for erectile dysfunction. 5 tablet 2   spironolactone (ALDACTONE) 25 MG tablet Take 0.5 tablets (12.5 mg total) by mouth daily. 45 tablet 3   ezetimibe (ZETIA) 10 MG tablet TAKE ONE TABLET BY MOUTH DAILY (Patient not taking: Reported on 06/07/2022) 90 tablet 3   tirzepatide (MOUNJARO) 5 MG/0.5ML Pen Inject 5 mg into the skin once a week. 6 mL 3   Current Facility-Administered Medications  Medication Dose Route Frequency Provider Last Rate Last Admin   sodium chloride flush (NS) 0.9 % injection 3 mL  3 mL Intravenous Q12H Azalee Course, PA         Review of Systems    He denies chest pain, palpitations, dyspnea, pnd, orthopnea, n, v, dizziness, syncope, edema, weight gain, or early satiety. All other systems reviewed and are otherwise negative except as noted above.   Physical Exam    VS:  BP 116/72   Pulse 98   Ht 5\' 9"  (1.753 m)   Wt 169 lb 6.4 oz (76.8 kg)   SpO2 96%   BMI 25.02 kg/m   GEN: Well nourished, well developed, in no acute distress. HEENT: normal. Neck: Supple, no JVD, carotid bruits, or masses. Cardiac: RRR, no murmurs, rubs, or gallops. No clubbing, cyanosis, edema.  Radials/DP/PT 2+ and equal bilaterally.  Respiratory:  Respirations regular and unlabored, clear to auscultation bilaterally. GI: Soft, nontender, nondistended, BS + x 4. MS: no deformity or atrophy. Skin: warm and dry, no rash. Neuro:  Strength and sensation are intact. Psych: Normal affect.  Accessory Clinical Findings    ECG personally reviewed by me today - No EKG in office today.   Lab Results  Component Value Date   WBC 10.0 05/22/2021   HGB 14.8 05/22/2021   HCT 43.5 05/22/2021   MCV 90 05/22/2021   PLT 320 05/22/2021   Lab Results  Component Value Date   CREATININE 1.42 09/08/2021   BUN 22 09/08/2021   NA 138 09/08/2021   K 4.5 09/08/2021    CL 103 09/08/2021   CO2 28 09/08/2021   Lab Results  Component Value Date   ALT 13 09/08/2020   AST 16 09/08/2020   ALKPHOS 56 09/08/2020   BILITOT 0.4 09/08/2020   Lab Results  Component Value Date   CHOL 121 09/08/2021   HDL 53.80 09/08/2021   LDLCALC 44 09/08/2021   TRIG 115.0 09/08/2021   CHOLHDL 2 09/08/2021    Lab Results  Component Value Date   HGBA1C 10.9 (A) 03/08/2022    Assessment & Plan    1. CAD: Cath in 08/2020 showed 90% pLAD, he reports current OM1, 80% PDA 1, 90% continue to stenoses.  Stable with no anginal symptoms.  No indication for ischemic evaluation. No ASA in the setting of chronic DOAC therapy.  Continue carvedilol, Entresto, spironolactone, Farxiga, and Repatha.  2. ICM: Echo in 03/2021 showed EF 25 to 30%, moderate LVH, G1 DD.   Cardiac MRI in 04/2021 revealed mild dilated left ventricle with wall motion abnormalities, EF 30%, normal RV size and systolic function, delayed enhancement pattern in the LAD and left circumflex territories, suggestive of prior MIs, no definite evidence for hypertrophic cardiomyopathy. S/p ICD. Following with EP. Euvolemic and well compensated on exam. Continue current medication as above.  3. Hypertension: BP well controlled. Continue current antihypertensive regimen.   4. Hyperlipidemia: LDL was 44 in 09/2021. Monitored per PCP. Continue Repatha.    5. History of CVA: On Eliquis. No recurrence. Managed per PCP.  6. Type 2 diabetes: A1c was 10.9 in 03/2022. Monitored and managed per PCP.  7. Disposition: Follow-up with EP per recall, follow-up with Dr. Jens Som in 1 year.      Joylene Grapes, NP 06/07/2022, 9:29 AM

## 2022-06-11 ENCOUNTER — Other Ambulatory Visit: Payer: Self-pay | Admitting: Cardiology

## 2022-06-11 NOTE — Telephone Encounter (Signed)
Prescription refill request for Eliquis received. Indication:STROKE Last office visit:6/24 Scr:1.42 Age: 63 Weight:76.8  KG  PRESCRIPTION REFILLED

## 2022-06-18 ENCOUNTER — Ambulatory Visit: Payer: Managed Care, Other (non HMO) | Admitting: Internal Medicine

## 2022-06-18 VITALS — BP 97/70 | HR 87 | Ht 70.0 in | Wt 165.8 lb

## 2022-06-18 DIAGNOSIS — E1165 Type 2 diabetes mellitus with hyperglycemia: Secondary | ICD-10-CM

## 2022-06-18 DIAGNOSIS — Z794 Long term (current) use of insulin: Secondary | ICD-10-CM

## 2022-06-18 DIAGNOSIS — N1831 Chronic kidney disease, stage 3a: Secondary | ICD-10-CM | POA: Diagnosis not present

## 2022-06-18 DIAGNOSIS — E1122 Type 2 diabetes mellitus with diabetic chronic kidney disease: Secondary | ICD-10-CM

## 2022-06-18 DIAGNOSIS — E1139 Type 2 diabetes mellitus with other diabetic ophthalmic complication: Secondary | ICD-10-CM

## 2022-06-18 DIAGNOSIS — E1159 Type 2 diabetes mellitus with other circulatory complications: Secondary | ICD-10-CM | POA: Diagnosis not present

## 2022-06-18 LAB — POCT GLYCOSYLATED HEMOGLOBIN (HGB A1C): Hemoglobin A1C: 8.8 % — AB (ref 4.0–5.6)

## 2022-06-18 LAB — GLUCOSE, POCT (MANUAL RESULT ENTRY): POC Glucose: 177 mg/dl — AB (ref 70–99)

## 2022-06-18 MED ORDER — FARXIGA 10 MG PO TABS: 10.0000 mg | ORAL_TABLET | Freq: Every day | ORAL | 3 refills | Status: DC

## 2022-06-18 MED ORDER — TIRZEPATIDE 5 MG/0.5ML ~~LOC~~ SOAJ: 5.0000 mg | SUBCUTANEOUS | 3 refills | Status: DC

## 2022-06-18 MED ORDER — PEN NEEDLES 32G X 4 MM MISC: 1.0000 | Freq: Every day | 3 refills | Status: DC

## 2022-06-18 MED ORDER — LANTUS SOLOSTAR 100 UNIT/ML ~~LOC~~ SOPN: 50.0000 [IU] | PEN_INJECTOR | SUBCUTANEOUS | 3 refills | Status: DC

## 2022-06-18 NOTE — Patient Instructions (Addendum)
Switch Trulicity to [employer] 5 mg once weekly  Continue Farxiga 10 mg, 1 tablet daily  Increase  Lantus/ Tresiba  50 units once  daily      HOW TO TREAT LOW BLOOD SUGARS (Blood sugar LESS THAN 70 MG/DL) Please follow the RULE OF 15 for the treatment of hypoglycemia treatment (when your (blood sugars are less than 70 mg/dL)   STEP 1: Take 15 grams of carbohydrates when your blood sugar is low, which includes:  3-4 GLUCOSE TABS  OR 3-4 OZ OF JUICE OR REGULAR SODA OR ONE TUBE OF GLUCOSE GEL    STEP 2: RECHECK blood sugar in 15 MINUTES STEP 3: If your blood sugar is still low at the 15 minute recheck --> then, go back to STEP 1 and treat AGAIN with another 15 grams of carbohydrates.

## 2022-06-18 NOTE — Progress Notes (Signed)
Name: John [name]  Age/ Sex: 62 y.o., male   MRN/ DOB: [id number], 1960/03/17     PCP: Mliss Sax, MD   Reason for Endocrinology Evaluation: Type 2 Diabetes Mellitus  Initial Endocrine Consultative Visit: 12/11/2017    PATIENT IDENTIFIER: John [name] is a 62 y.o. male with a past medical history of T2DM, Hx CVA, CHF and CAD. The patient has followed with Endocrinology clinic since 12/11/20219 for consultative assistance with management of his diabetes.     DIABETIC HISTORY:  John [name] was diagnosed with DM 2004, and started insulin therapy approximately 2012 . His hemoglobin A1c has ranged from 6.3% in 2022, peaking at 8.4% in 23.    He was followed up by Dr. Everardo All between 2019 until March 2023  On his initial visit with me  09/2021 he had an A1c 8.3%, he was on Trulicity, Farxiga and Lantus   Switched Trulicity to [facility]  03/2022  SUBJECTIVE:   During the last visit (03/08/2022): A1c 8.3%     Today (06/18/2022): Mr. [name]  He checks his blood sugars occasionally times daily. The patient has not had hypoglycemic episodes since the last clinic visit   Patient is s/p ICD placement on 06/02/2021, had a follow up with cardiology 06/07/2022  Denies nausea, vomiting  Has occasional   constipation  and  diarrhea    He is still on Trulicity and has not switched to [facility] DIABETES REGIMEN:  Farxiga 10 mg daily Mounjaro 5 mg weekly  ( Sundays)  Lantus 50 units daily - takes 45 units      Statin: no, patient on Repatha ACE-I/ARB: Yes    METER DOWNLOAD SUMMARY: Did not bring     DIABETIC COMPLICATIONS: Microvascular complications:  CKD III Denies: neuropathy, retinopathy  Last Eye Exam: Completed 08/2021  Macrovascular complications:  CVA (05/2020), CAD Denies: PVD   HISTORY:  Past Medical History:  Past Medical History:  Diagnosis Date   Arthritis    Diabetes mellitus without complication (HCC)    Hyperlipidemia     Hypertension    Stroke (HCC) 05/2020   Past Surgical History:  Past Surgical History:  Procedure Laterality Date   ICD IMPLANT N/A 06/02/2021   Procedure: ICD IMPLANT;  Surgeon: Duke Salvia, MD;  Location: [facility] INVASIVE CV LAB;  Service: Cardiovascular;  Laterality: N/A;   LEFT HEART CATH AND CORONARY ANGIOGRAPHY N/A 08/18/2020   Procedure: LEFT HEART CATH AND CORONARY ANGIOGRAPHY;  Surgeon: Swazil[place], Peter M, MD;  Location: [facility] INVASIVE CV LAB;  Service: Cardiovascular;  Laterality: N/A;   TONSILLECTOMY     Social History:  reports that he has quit smoking. He has never used smokeless tobacco. He reports current alcohol use. He reports that he does not use drugs. Family History:  Family History  Problem Relation Age of Onset   Diabetes Mother    CAD Mother    Heart attack Mother        x 2   COPD Father    Colon cancer Neg Hx    Esophageal cancer Neg Hx    Rectal cancer Neg Hx    Stomach cancer Neg Hx      HOME MEDICATIONS: Allergies as of 06/18/2022   No Known Allergies      Medication List        Accurate as of June 18, 2022 11:35 AM. If you have any questions, ask your nurse or doctor.  acetaminophen 500 MG tablet Commonly known as: TYLENOL Take 500 mg by mouth every 6 (six) hours as needed for moderate pain or headache.   BC FAST PAIN RELIEF PO Take 1 packet by mouth daily as needed (pain).   carvedilol 12.5 MG tablet Commonly known as: COREG Take 1 tablet (12.5 mg total) by mouth 2 (two) times daily with a meal.   Debrox 6.5 % OTIC solution Generic drug: carbamide peroxide Place 5 drops into both ears 2 (two) times daily. What changed:  when to take this reasons to take this   Dexcom G7 Sensor Misc 1 Device by Does not apply route as directed.   diclofenac Sodium 1 % Gel Commonly known as: Voltaren Rub a small grape sized dollop into knee up to 3 times daily as needed.   Eliquis 5 MG Tabs tablet Generic drug: apixaban TAKE ONE TABLET  BY MOUTH TWICE A DAY   Entresto 24-26 MG Generic drug: sacubitril-valsartan TAKE ONE TABLET BY MOUTH TWICE A DAY   ezetimibe 10 MG tablet Commonly known as: ZETIA TAKE ONE TABLET BY MOUTH DAILY   Farxiga 10 MG Tabs tablet Generic drug: dapagliflozin propanediol Take 1 tablet (10 mg total) by mouth daily.   glucose blood test strip 1 each by Other route 2 (two) times daily. And lancets 2/day   Lantus SoloStar 100 UNIT/ML Solostar Pen Generic drug: insulin glargine Inject 50 Units into the skin every morning. And pen needles 1/day   Pen Needles 32G X 4 MM Misc 1 Package by Does not apply route daily in the afternoon.   Repatha SureClick 140 MG/ML Soaj Generic drug: Evolocumab INJECT 140MG  INTO THE SKIN EVERY 14 DAYS   sildenafil 100 MG tablet Commonly known as: Viagra Take 0.5-1 tablets (50-100 mg total) by mouth daily as needed for erectile dysfunction.   spironolactone 25 MG tablet Commonly known as: Aldactone Take 0.5 tablets (12.5 mg total) by mouth daily.   tirzepatide 5 MG/0.5ML Pen Commonly known as: MOUNJARO Inject 5 mg into the skin once a week.         OBJECTIVE:   Vital Signs: BP 97/70 (BP Location: Left Arm, Patient Position: Sitting)   Pulse 87   Ht 5\' 10"  (1.778 m)   Wt 165 lb 12.8 oz (75.2 kg)   SpO2 97%   BMI 23.79 kg/m   Wt Readings from Last 3 Encounters:  06/18/22 165 lb 12.8 oz (75.2 kg)  06/07/22 169 lb 6.4 oz (76.8 kg)  03/08/22 169 lb (76.7 kg)     Exam: General: Pt appears well and is in NAD  Neck: General: Supple without adenopathy. Thyroid: Thyroid size normal.  No goiter or nodules appreciated.   Lungs: Clear with good BS bilat   Heart: RRR   Extremities: No pretibial edema.   Neuro: MS is good with appropriate affect, pt is alert and Ox3    DM foot exam:06/18/2202  The skin of the feet is  without sores or ulcerations, dystrophic nails  The pedal pulses are 1+ on right and 1+ on left. The sensation is intact to a  screening 5.07, 10 gram monofilament bilaterally          DATA REVIEWED:  Lab Results  Component Value Date   HGBA1C 8.8 (A) 06/18/2022   HGBA1C 10.9 (A) 03/08/2022   HGBA1C 8.3 (A) 09/06/2021    Latest Reference Range & Units 09/08/21 08:42  Sodium 135 - 145 mEq/L 138  Potassium 3.5 - 5.1 mEq/L 4.5  Chloride  96 - 112 mEq/L 103  CO2 19 - 32 mEq/L 28  Glucose 70 - 99 mg/dL 401 (H)  BUN 6 - 23 mg/dL 22  Creatinine 0.27 - 2.53 mg/dL 6.64  Calcium 8.4 - 40.3 mg/dL 9.5  GFR >47.42 mL/min 53.79 (L)  Total CHOL/HDL Ratio  2  Cholesterol 0 - 200 mg/dL 595  HDL Cholesterol >63.87 mg/dL 56.43  LDL (calc) 0 - 99 mg/dL 44  NonHDL  32.95  Triglycerides 0.0 - 149.0 mg/dL 188.4  VLDL 0.0 - 16.6 mg/dL 06.3   In office  Bg 016 mg/dL    ASSESSMENT / PLAN / RECOMMENDATIONS:   1) Type 2 Diabetes Mellitus, Poorly controlled, With CKD III and macrovascular complications - Most recent A1c of 8.8 %. Goal A1c < 7.0 %.    - Pt continues with poorly controlled diabetes  - He has not made the switch to [facility] yet, he is under the impression that the Billie Lade is to replace Lantus ?  - I made sure I clarified this today that mounjaro is to replace Trulicity NOT insulin  - He is asking for samples, I gave him a #1 box of Tresiba , I explained to the pt these are interchangeable  - He is also on smaller dose of insulin than previously prescribed, I suspect imprefect adherence to medications  - Unable to get the dexcom   MEDICATIONS: Increase Lantus/Tresiba  50 units daily  Continue Farxiga 10 mg daily  Switch Trulicity to mounjaro 5 mg weekly   EDUCATION / INSTRUCTIONS: BG monitoring instructions: Patient is instructed to check his blood sugars 1 times a day, fasting . Call [place] [facility] if: BG persistently < 70  I reviewed the Rule of 15 for the treatment of hypoglycemia in detail with the patient. Literature supplied.    2) Diabetic complications:  Eye: Does not have  known diabetic retinopathy.  Neuro/ Feet: Does not have known diabetic peripheral neuropathy .  Renal: Patient does have known baseline CKD. He   is  on an ACEI/ARB at present.      F/U in 4 months     Signed electronically by: Lyndle Herrlich, MD  [facility] Endocrinology  [facility] [address] [place], [place] [address] Phone: [phone] FAX: [phone]   CC: Mliss Sax, MD [address] [place] [address] Phone: [phone]  Fax: [phone]  Return to Endocrinology clinic as below: Future Appointments  Date Time Provider Department Center  07/09/2022  1:45 PM Duke Salvia, MD [facility]  09/04/2022  7:15 AM [facility] DEVICE REMOTES [facility]

## 2022-06-20 ENCOUNTER — Telehealth: Payer: Self-pay | Admitting: Family Medicine

## 2022-06-20 NOTE — Telephone Encounter (Signed)
Patient aware that letter to be excused from jury duty can not be written due to patient being able to go.

## 2022-06-20 NOTE — Telephone Encounter (Signed)
Please advise message below patient requesting letter to be excused from jury duty.

## 2022-06-20 NOTE — Telephone Encounter (Signed)
Pt walked in the office and said he said he need the dr to give him a letter/excuse for not serving jury duty. Pt said please give him a call to let him know if he will get this

## 2022-06-28 NOTE — Progress Notes (Signed)
Remote ICD transmission.   

## 2022-07-09 ENCOUNTER — Ambulatory Visit: Payer: Managed Care, Other (non HMO) | Attending: Internal Medicine | Admitting: Internal Medicine

## 2022-07-09 ENCOUNTER — Encounter: Payer: Self-pay | Admitting: Internal Medicine

## 2022-07-18 ENCOUNTER — Ambulatory Visit: Payer: Managed Care, Other (non HMO) | Admitting: Internal Medicine

## 2022-07-22 ENCOUNTER — Other Ambulatory Visit: Payer: Self-pay | Admitting: Cardiology

## 2022-08-08 ENCOUNTER — Other Ambulatory Visit: Payer: Self-pay | Admitting: Internal Medicine

## 2022-08-08 DIAGNOSIS — I5022 Chronic systolic (congestive) heart failure: Secondary | ICD-10-CM

## 2022-08-13 ENCOUNTER — Ambulatory Visit: Payer: Managed Care, Other (non HMO) | Admitting: Internal Medicine

## 2022-08-20 ENCOUNTER — Telehealth: Payer: Managed Care, Other (non HMO)

## 2022-08-20 NOTE — Telephone Encounter (Signed)
I have this patient returning for an appointment on this upcoming Wednesday 09/22/2022 @ 9:40am - he is requesting help with the cost of his prescription (Repatha) he says this is an injectable prescription that is costing him well over $100.00 per refill.  How can I get him in touch with someone who can help this patient was a "savings card."  He says the last one he was provided has run out of funds, and he is requesting another one.  Molli asked me to reach out to you with this question.

## 2022-08-20 NOTE — Telephone Encounter (Signed)
Patient should go to [email or web address], click on the red button in the upper right and reapply for a copay card. They typically expire after a year

## 2022-08-22 ENCOUNTER — Encounter: Payer: Self-pay | Admitting: Student

## 2022-08-22 ENCOUNTER — Ambulatory Visit: Payer: Managed Care, Other (non HMO) | Attending: Internal Medicine | Admitting: Student

## 2022-08-22 VITALS — BP 100/78 | HR 82 | Ht 69.0 in | Wt 172.0 lb

## 2022-08-22 DIAGNOSIS — I255 Ischemic cardiomyopathy: Secondary | ICD-10-CM

## 2022-08-22 DIAGNOSIS — I2510 Atherosclerotic heart disease of native coronary artery without angina pectoris: Secondary | ICD-10-CM | POA: Diagnosis not present

## 2022-08-22 DIAGNOSIS — I5022 Chronic systolic (congestive) heart failure: Secondary | ICD-10-CM

## 2022-08-22 DIAGNOSIS — I10 Essential (primary) hypertension: Secondary | ICD-10-CM

## 2022-08-22 LAB — CUP PACEART INCLINIC DEVICE CHECK
Date Time Interrogation Session: 20240821095655
HighPow Impedance: 80 Ohm
Implantable Lead Connection Status: 753985
Implantable Lead Implant Date: 20230602
Implantable Lead Location: 753860
Implantable Lead Model: 138
Implantable Lead Serial Number: 303305
Implantable Pulse Generator Implant Date: 20230602
Lead Channel Impedance Value: 665 Ohm
Lead Channel Pacing Threshold Amplitude: 0.5 V
Lead Channel Pacing Threshold Pulse Width: 0.4 ms
Lead Channel Sensing Intrinsic Amplitude: 21.5 mV
Lead Channel Setting Pacing Amplitude: 2.5 V
Lead Channel Setting Pacing Pulse Width: 0.4 ms
Lead Channel Setting Sensing Sensitivity: 0.5 mV
Pulse Gen Serial Number: 217470

## 2022-08-22 NOTE — Progress Notes (Signed)
  Electrophysiology Office Note:   ID:  [name], [name] 06/24/1960, MRN [id number]  Primary Cardiologist: Olga Millers, MD Electrophysiologist: Sherryl Manges, MD      History of Present Illness:   John [name] is a 61 y.o. male with h/o ischemic cardiomyopathy, HTN, HLD, DM2, and CVA seen today for routine electrophysiology followup.   Since last being seen in our clinic the patient reports doing very well. Needs a new repatha copay card.  he denies chest pain, palpitations, dyspnea, PND, orthopnea, nausea, vomiting, dizziness, syncope, edema, weight gain, or early satiety.   Review of systems complete and found to be negative unless listed in HPI.   EP Information / Studies Reviewed:    EKG is ordered today. Personal review as below.  EKG Interpretation Date/Time:  Wednesday August 22 2022 09:31:40 EDT Ventricular Rate:  82 PR Interval:  166 QRS Duration:  92 QT Interval:  362 QTC Calculation: 422 R Axis:   -25  Text Interpretation: Normal sinus rhythm Minimal voltage criteria for LVH, may be normal variant ( R in aVL ) Confirmed by Maxine Glenn [phone]) on 08/22/2022 9:40:13 AM    ICD Interrogation-  reviewed in detail today,  See PACEART report.  Device History: Magazine features editor ICD implanted 06/2021 for ICM  Physical Exam:   VS:  BP 100/78   Pulse 82   Ht 5\' 9"  (1.753 m)   Wt 172 lb (78 kg)   BMI 25.40 kg/m    Wt Readings from Last 3 Encounters:  08/22/22 172 lb (78 kg)  06/18/22 165 lb 12.8 oz (75.2 kg)  06/07/22 169 lb 6.4 oz (76.8 kg)     GEN: Well nourished, well developed in no acute distress NECK: No JVD; No carotid bruits CARDIAC: Regular rate and rhythm, no murmurs, rubs, gallops RESPIRATORY:  Clear to auscultation without rales, wheezing or rhonchi  ABDOMEN: Soft, non-tender, non-distended EXTREMITIES:  No edema; No deformity   ASSESSMENT AND PLAN:    Chronic systolic dysfunction s/p [employer] single chamber  ICD  euvolemic today Stable on an appropriate medical regimen Normal ICD function See Pace Art report No changes today  CAD  Denies s/s ischemia  H/o CVA On Eliquis   HTN Stable on current regimen    HLD Per primary team Instructions given to re-enroll in Repatha co-pay card  Disposition:   Follow up with Dr. Graciela Husbands in 12 months   Signed, Graciella Freer, PA-C

## 2022-08-22 NOTE — Patient Instructions (Signed)
 Medication Instructions:  Your physician recommends that you continue on your current medications as directed. Please refer to the Current Medication list given to you today.  *If you need a refill on your cardiac medications before your next appointment, please call your pharmacy*  Lab Work: BMET, CBC-TODAY If you have labs (blood work) drawn today and your tests are completely normal, you will receive your results only by: MyChart Message (if you have MyChart) OR A paper copy in the mail If you have any lab test that is abnormal or we need to change your treatment, we will call you to review the results.  Follow-Up: At [facility], you and your health needs are our priority.  As part of our continuing mission to provide you with exceptional heart care, we have created designated Provider Care Teams.  These Care Teams include your primary Cardiologist (physician) and Advanced Practice Providers (APPs -  Physician Assistants and Nurse Practitioners) who all work together to provide you with the care you need, when you need it.  Your next appointment:   1 year(s)  Provider:   Sherryl Manges, MD

## 2022-08-23 LAB — BASIC METABOLIC PANEL
BUN/Creatinine Ratio: 17 (ref 10–24)
BUN: 19 mg/dL (ref 8–27)
CO2: 20 mmol/L (ref 20–29)
Calcium: 9.4 mg/dL (ref 8.6–10.2)
Chloride: 104 mmol/L (ref 96–106)
Creatinine, Ser: 1.1 mg/dL (ref 0.76–1.27)
Glucose: 71 mg/dL (ref 70–99)
Potassium: 4.4 mmol/L (ref 3.5–5.2)
Sodium: 140 mmol/L (ref 134–144)
eGFR: 76 mL/min/{1.73_m2} (ref >59)

## 2022-08-23 LAB — CBC
Hematocrit: 49.4 % (ref 37.5–51.0)
Hemoglobin: 16.3 g/dL (ref 13.0–17.7)
MCH: 29.9 pg (ref 26.6–33.0)
MCHC: 33 g/dL (ref 31.5–35.7)
MCV: 91 fL (ref 79–97)
Platelets: 368 10*3/uL (ref 150–450)
RBC: 5.45 x10E6/uL (ref 4.14–5.80)
RDW: 12.8 % (ref 11.6–15.4)
WBC: 9.1 10*3/uL (ref 3.4–10.8)

## 2022-09-04 ENCOUNTER — Ambulatory Visit: Payer: Managed Care, Other (non HMO)

## 2022-09-04 DIAGNOSIS — I255 Ischemic cardiomyopathy: Secondary | ICD-10-CM

## 2022-09-04 DIAGNOSIS — I5022 Chronic systolic (congestive) heart failure: Secondary | ICD-10-CM

## 2022-09-04 LAB — CUP PACEART REMOTE DEVICE CHECK
Battery Remaining Longevity: 168 mo
Battery Remaining Percentage: 100 %
Brady Statistic RV Percent Paced: 0 %
Date Time Interrogation Session: 20240903041100
HighPow Impedance: 80 Ohm
Implantable Lead Connection Status: 753985
Implantable Lead Implant Date: 20230602
Implantable Lead Location: 753860
Implantable Lead Model: 138
Implantable Lead Serial Number: 303305
Implantable Pulse Generator Implant Date: 20230602
Lead Channel Impedance Value: 614 Ohm
Lead Channel Pacing Threshold Amplitude: 0.5 V
Lead Channel Pacing Threshold Pulse Width: 0.4 ms
Lead Channel Setting Pacing Amplitude: 2.5 V
Lead Channel Setting Pacing Pulse Width: 0.4 ms
Lead Channel Setting Sensing Sensitivity: 0.5 mV
Pulse Gen Serial Number: 217470

## 2022-09-12 NOTE — Progress Notes (Signed)
Remote ICD transmission.   

## 2022-11-06 ENCOUNTER — Encounter: Payer: Managed Care, Other (non HMO) | Admitting: Internal Medicine

## 2022-11-06 ENCOUNTER — Encounter: Payer: Self-pay | Admitting: Internal Medicine

## 2022-11-07 ENCOUNTER — Other Ambulatory Visit: Payer: Self-pay | Admitting: Family Medicine

## 2022-11-07 DIAGNOSIS — I2510 Atherosclerotic heart disease of native coronary artery without angina pectoris: Secondary | ICD-10-CM

## 2022-11-07 DIAGNOSIS — E7800 Pure hypercholesterolemia, unspecified: Secondary | ICD-10-CM

## 2022-11-12 ENCOUNTER — Ambulatory Visit: Payer: Managed Care, Other (non HMO) | Admitting: Family Medicine

## 2022-11-13 ENCOUNTER — Ambulatory Visit: Payer: Managed Care, Other (non HMO) | Admitting: Family Medicine

## 2022-11-13 ENCOUNTER — Encounter: Payer: Self-pay | Admitting: Family Medicine

## 2022-11-13 VITALS — BP 117/72 | HR 111 | Temp 97.6°F | Ht 69.0 in | Wt 172.6 lb

## 2022-11-13 DIAGNOSIS — N5201 Erectile dysfunction due to arterial insufficiency: Secondary | ICD-10-CM | POA: Diagnosis not present

## 2022-11-13 DIAGNOSIS — Z125 Encounter for screening for malignant neoplasm of prostate: Secondary | ICD-10-CM | POA: Diagnosis not present

## 2022-11-13 DIAGNOSIS — Z23 Encounter for immunization: Secondary | ICD-10-CM

## 2022-11-13 DIAGNOSIS — Z0000 Encounter for general adult medical examination without abnormal findings: Secondary | ICD-10-CM

## 2022-11-13 DIAGNOSIS — E7800 Pure hypercholesterolemia, unspecified: Secondary | ICD-10-CM | POA: Diagnosis not present

## 2022-11-13 DIAGNOSIS — I10 Essential (primary) hypertension: Secondary | ICD-10-CM | POA: Diagnosis not present

## 2022-11-13 DIAGNOSIS — I2510 Atherosclerotic heart disease of native coronary artery without angina pectoris: Secondary | ICD-10-CM | POA: Diagnosis not present

## 2022-11-13 LAB — COMPREHENSIVE METABOLIC PANEL
ALT: 23 U/L (ref 0–53)
AST: 18 U/L (ref 0–37)
Albumin: 4.5 g/dL (ref 3.5–5.2)
Alkaline Phosphatase: 75 U/L (ref 39–117)
BUN: 23 mg/dL (ref 6–23)
CO2: 23 meq/L (ref 19–32)
Calcium: 9.4 mg/dL (ref 8.4–10.5)
Chloride: 101 meq/L (ref 96–112)
Creatinine, Ser: 1.22 mg/dL (ref 0.40–1.50)
GFR: 64.01 mL/min (ref >60.00)
Glucose, Bld: 377 mg/dL — ABNORMAL HIGH (ref 70–99)
Potassium: 4.4 meq/L (ref 3.5–5.1)
Sodium: 135 meq/L (ref 135–145)
Total Bilirubin: 0.8 mg/dL (ref 0.2–1.2)
Total Protein: 6.9 g/dL (ref 6.0–8.3)

## 2022-11-13 LAB — CBC
HCT: 47.2 % (ref 39.0–52.0)
Hemoglobin: 16.3 g/dL (ref 13.0–17.0)
MCHC: 34.6 g/dL (ref 30.0–36.0)
MCV: 92.3 fL (ref 78.0–100.0)
Platelets: 295 10*3/uL (ref 150.0–400.0)
RBC: 5.11 Mil/uL (ref 4.22–5.81)
RDW: 12.7 % (ref 11.5–15.5)
WBC: 8.5 10*3/uL (ref 4.0–10.5)

## 2022-11-13 LAB — URINALYSIS, ROUTINE W REFLEX MICROSCOPIC
Bilirubin Urine: NEGATIVE
Hgb urine dipstick: NEGATIVE
Ketones, ur: NEGATIVE
Leukocytes,Ua: NEGATIVE
Nitrite: NEGATIVE
RBC / HPF: NONE SEEN (ref 0–?)
Specific Gravity, Urine: 1.015 (ref 1.000–1.030)
Total Protein, Urine: NEGATIVE
Urine Glucose: >=1000 — AB
Urobilinogen, UA: 0.2 (ref 0.0–1.0)
WBC, UA: NONE SEEN (ref 0–?)
pH: 6 (ref 5.0–8.0)

## 2022-11-13 LAB — MICROALBUMIN / CREATININE URINE RATIO
Creatinine,U: 51.5 mg/dL
Microalb Creat Ratio: 1.4 mg/g (ref 0.0–30.0)
Microalb, Ur: <0.7 mg/dL (ref 0.0–1.9)

## 2022-11-13 LAB — PSA: PSA: 0.55 ng/mL (ref 0.10–4.00)

## 2022-11-13 LAB — LDL CHOLESTEROL, DIRECT: Direct LDL: 53 mg/dL

## 2022-11-13 MED ORDER — EZETIMIBE 10 MG PO TABS
10.0000 mg | ORAL_TABLET | Freq: Every day | ORAL | 3 refills | Status: AC
Start: 2022-11-13 — End: ?

## 2022-11-13 MED ORDER — SILDENAFIL CITRATE 100 MG PO TABS
50.0000 mg | ORAL_TABLET | Freq: Every day | ORAL | 2 refills | Status: AC | PRN
Start: 2022-11-13 — End: ?

## 2022-11-13 NOTE — Progress Notes (Signed)
Established Patient Office Visit   Subjective:  Patient ID: John [name], male    DOB: 1960-03-27  Age: 62 y.o. MRN: [id number]  Chief Complaint  Patient presents with   Medical Management of Chronic Issues    Check up. Pt needs rx refills. And Flu shot today.     HPI Encounter Diagnoses  Name Primary?   Healthcare maintenance Yes   Elevated LDL cholesterol level    Coronary artery disease involving native coronary artery of native heart without angina pectoris    Erectile dysfunction due to arterial insufficiency    Need for influenza vaccination    Screening for prostate cancer    Essential hypertension    For physical today in follow-up of above.  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  Hoping to retire at the end of May and have knee replacement surgery.  History of CHF with CAD, ischemic cardiomyopathy with an implanted defibrillator.  Continues with Repatha and Zetia.  Nonfasting today.  Otherwise continues with carvedilol, Entresto and Aldactone.  He is physically active on his job.  He is married and lives with his wife.  Last A1c was 8.8.  Continue sildenafil as needed for ED.   Review of Systems  Constitutional: Negative.   HENT: Negative.    Eyes:  Negative for blurred vision, discharge and redness.  Respiratory: Negative.  Negative for shortness of breath.   Cardiovascular: Negative.  Negative for chest pain.  Gastrointestinal:  Negative for abdominal pain, blood in stool, constipation and melena.  Genitourinary: Negative.  Negative for frequency and urgency.  Musculoskeletal:  Positive for joint pain. Negative for myalgias.  Skin:  Negative for rash.  Neurological:  Negative for tingling, loss of consciousness and weakness.  Endo/Heme/Allergies:  Negative for polydipsia.  Psychiatric/Behavioral:  Negative for depression. The patient is not nervous/anxious.      Current Outpatient Medications:    acetaminophen (TYLENOL) 500 MG tablet,  Take 500 mg by mouth every 6 (six) hours as needed for moderate pain or headache., Disp: , Rfl:    carvedilol (COREG) 12.5 MG tablet, TAKE ONE TABLET BY MOUTH TWICE A DAY WITH A MEAL, Disp: 180 tablet, Rfl: 3   Continuous Blood Gluc Sensor (DEXCOM G7 SENSOR) MISC, 1 Device by Does not apply route as directed., Disp: 9 each, Rfl: 3   diclofenac Sodium (VOLTAREN) 1 % GEL, Rub a small grape sized dollop into knee up to 3 times daily as needed., Disp: 350 g, Rfl: 1   ELIQUIS 5 MG TABS tablet, TAKE ONE TABLET BY MOUTH TWICE A DAY, Disp: 180 tablet, Rfl: 1   ENTRESTO 24-26 MG, TAKE ONE TABLET BY MOUTH TWICE A DAY, Disp: 60 tablet, Rfl: 6   FARXIGA 10 MG TABS tablet, Take 1 tablet (10 mg total) by mouth daily., Disp: 90 tablet, Rfl: 3   glucose blood test strip, 1 each by Other route 2 (two) times daily. And lancets 2/day, Disp: 200 each, Rfl: 3   insulin glargine (LANTUS SOLOSTAR) 100 UNIT/ML Solostar Pen, Inject 50 Units into the skin every morning. And pen needles 1/day, Disp: 45 mL, Rfl: 3   Insulin Pen Needle (PEN NEEDLES) 32G X 4 MM MISC, 1 Package by Does not apply route daily in the afternoon., Disp: 100 each, Rfl: 3   REPATHA SURECLICK 140 MG/ML SOAJ, INJECT 140MG  INTO THE SKIN EVERY 14 DAYS, Disp: 2 mL, Rfl: 11   spironolactone (ALDACTONE) 25 MG tablet, TAKE 1/2 TABLET BY  MOUTH DAILY, Disp: 45 tablet, Rfl: 3   tirzepatide (MOUNJARO) 5 MG/0.5ML Pen, Inject 5 mg into the skin once a week., Disp: 6 mL, Rfl: 3   ezetimibe (ZETIA) 10 MG tablet, Take 1 tablet (10 mg total) by mouth daily., Disp: 90 tablet, Rfl: 3   sildenafil (VIAGRA) 100 MG tablet, Take 0.5-1 tablets (50-100 mg total) by mouth daily as needed for erectile dysfunction., Disp: 5 tablet, Rfl: 2  Current Facility-Administered Medications:    sodium chloride flush (NS) 0.9 % injection 3 mL, 3 mL, Intravenous, Q12H, Meng, Hao, PA   Objective:     BP 117/72   Pulse (!) 111   Temp 97.6 F (36.4 C)   Ht 5\' 9"  (1.753 m)   Wt 172 lb  9.6 oz (78.3 kg)   SpO2 96%   BMI 25.49 kg/m    Physical Exam Constitutional:      General: He is not in acute distress.    Appearance: Normal appearance. He is not ill-appearing, toxic-appearing or diaphoretic.  HENT:     Head: Normocephalic and atraumatic.     Right Ear: External ear normal.     Left Ear: External ear normal.     Mouth/Throat:     Mouth: Mucous membranes are moist.     Pharynx: Oropharynx is clear. No oropharyngeal exudate or posterior oropharyngeal erythema.  Eyes:     General: No scleral icterus.       Right eye: No discharge.        Left eye: No discharge.     Extraocular Movements: Extraocular movements intact.     Conjunctiva/sclera: Conjunctivae normal.     Pupils: Pupils are equal, round, and reactive to light.  Cardiovascular:     Rate and Rhythm: Normal rate and regular rhythm.  Pulmonary:     Effort: Pulmonary effort is normal. No respiratory distress.     Breath sounds: Normal breath sounds.  Abdominal:     General: Bowel sounds are normal.     Tenderness: There is no right CVA tenderness or left CVA tenderness.  Musculoskeletal:     Cervical back: No rigidity or tenderness.  Skin:    General: Skin is warm and dry.  Neurological:     Mental Status: He is alert and oriented to person, place, and time.  Psychiatric:        Mood and Affect: Mood normal.        Behavior: Behavior normal.      No results found for any visits on 11/13/22.    The ASCVD Risk score (Arnett DK, et al., 2019) failed to calculate for the following reasons:   The patient has a prior MI or stroke diagnosis    Assessment & Plan:   Healthcare maintenance  Elevated LDL cholesterol level -     Ezetimibe; Take 1 tablet (10 mg total) by mouth daily.  Dispense: 90 tablet; Refill: 3 -     Comprehensive metabolic panel -     LDL cholesterol, direct  Coronary artery disease involving native coronary artery of native heart without angina pectoris -     Ezetimibe;  Take 1 tablet (10 mg total) by mouth daily.  Dispense: 90 tablet; Refill: 3 -     LDL cholesterol, direct  Erectile dysfunction due to arterial insufficiency -     Sildenafil Citrate; Take 0.5-1 tablets (50-100 mg total) by mouth daily as needed for erectile dysfunction.  Dispense: 5 tablet; Refill: 2  Need for influenza vaccination -  Flu vaccine trivalent PF, 6mos and older(Flulaval,Afluria,Fluarix,Fluzone)  Screening for prostate cancer -     PSA  Essential hypertension -     CBC -     Urinalysis, Routine w reflex microscopic -     Microalbumin / creatinine urine ratio    Return in about 6 months (around 05/13/2023).  He is due for colonoscopy and is on schedule for recall.  Encouraged better control with his diabetes.  A1c will need to be less than 8 for surgery.  Information was given about health maintenance and disease prevention.  Mliss Sax, MD

## 2022-11-28 ENCOUNTER — Ambulatory Visit: Payer: Managed Care, Other (non HMO) | Admitting: Internal Medicine

## 2022-11-28 ENCOUNTER — Encounter: Payer: Self-pay | Admitting: Internal Medicine

## 2022-11-28 VITALS — BP 118/74 | HR 80 | Ht 69.0 in | Wt 174.0 lb

## 2022-11-28 DIAGNOSIS — E1165 Type 2 diabetes mellitus with hyperglycemia: Secondary | ICD-10-CM | POA: Diagnosis not present

## 2022-11-28 DIAGNOSIS — E1159 Type 2 diabetes mellitus with other circulatory complications: Secondary | ICD-10-CM | POA: Diagnosis not present

## 2022-11-28 DIAGNOSIS — Z794 Long term (current) use of insulin: Secondary | ICD-10-CM

## 2022-11-28 LAB — POCT GLYCOSYLATED HEMOGLOBIN (HGB A1C): Hemoglobin A1C: 10.3 % — AB (ref 4.0–5.6)

## 2022-11-28 LAB — POCT GLUCOSE (DEVICE FOR HOME USE): POC Glucose: 213 mg/dL — AB (ref 70–99)

## 2022-11-28 MED ORDER — PEN NEEDLES 32G X 4 MM MISC: 1.0000 | Freq: Every day | 3 refills | Status: DC

## 2022-11-28 MED ORDER — TIRZEPATIDE 7.5 MG/0.5ML ~~LOC~~ SOAJ: 7.5000 mg | SUBCUTANEOUS | 3 refills | Status: DC

## 2022-11-28 MED ORDER — DAPAGLIFLOZIN PROPANEDIOL 10 MG PO TABS: 10.0000 mg | ORAL_TABLET | Freq: Every day | ORAL | 3 refills | Status: DC

## 2022-11-28 MED ORDER — LANTUS SOLOSTAR 100 UNIT/ML ~~LOC~~ SOPN: 54.0000 [IU] | PEN_INJECTOR | SUBCUTANEOUS | 3 refills | Status: DC

## 2022-11-28 NOTE — Patient Instructions (Signed)
Increase  Mounjaro 7.5 mg once a WEEK Continue Farxiga 10 mg, 1 tablet daily  Increase  Lantus  54 units once  daily      HOW TO TREAT LOW BLOOD SUGARS (Blood sugar LESS THAN 70 MG/DL) Please follow the RULE OF 15 for the treatment of hypoglycemia treatment (when your (blood sugars are less than 70 mg/dL)   STEP 1: Take 15 grams of carbohydrates when your blood sugar is low, which includes:  3-4 GLUCOSE TABS  OR 3-4 OZ OF JUICE OR REGULAR SODA OR ONE TUBE OF GLUCOSE GEL    STEP 2: RECHECK blood sugar in 15 MINUTES STEP 3: If your blood sugar is still low at the 15 minute recheck --> then, go back to STEP 1 and treat AGAIN with another 15 grams of carbohydrates.

## 2022-11-28 NOTE — Progress Notes (Signed)
Name: John [name]  Age/ Sex: 62 y.o., male   MRN/ DOB: [id number], 11/07/1960     PCP: Mliss Sax, MD   Reason for Endocrinology Evaluation: Type 2 Diabetes Mellitus  Initial Endocrine Consultative Visit: 12/11/2017    PATIENT IDENTIFIER: John [name] is a 62 y.o. male with a past medical history of T2DM, Hx CVA, CHF and CAD. The patient has followed with Endocrinology clinic since 12/11/20219 for consultative assistance with management of his diabetes.     DIABETIC HISTORY:  Mr. [name] was diagnosed with DM 2004, and started insulin therapy approximately 2012 . His hemoglobin A1c has ranged from 6.3% in 2022, peaking at 8.4% in 23.    He was followed up by Dr. Everardo All between 2019 until March 2023  On his initial visit with me  09/2021 he had an A1c 8.3%, he was on Trulicity, Farxiga and Lantus   Switched Trulicity to [facility]  06/2022  SUBJECTIVE:   During the last visit (06/18/2022): A1c 8.8%     Today (11/28/2022): John [name]  He checks his blood sugars occasionally times daily. The patient has not had hypoglycemic episodes since the last clinic visit   Patient is s/p ICD placement on 06/02/2021, had a follow up with cardiology 08/2022  Denies nausea, vomiting  Has occasional constipation  but no diarrhea   Had chips and cake this morning   HOME DIABETES REGIMEN:  Farxiga 10 mg daily Mounjaro 5 mg weekly  ( Sundays)  Lantus 50 units daily      Statin: no, patient on Repatha ACE-I/ARB: Yes    METER DOWNLOAD SUMMARY: Did not bring     DIABETIC COMPLICATIONS: Microvascular complications:  CKD III Denies: neuropathy, retinopathy  Last Eye Exam: Completed 08/2021  Macrovascular complications:  CVA (05/2020), CAD Denies: PVD   HISTORY:  Past Medical History:  Past Medical History:  Diagnosis Date   Arthritis    Diabetes mellitus without complication (HCC)    Hyperlipidemia    Hypertension    Stroke (HCC) 05/2020    Past Surgical History:  Past Surgical History:  Procedure Laterality Date   ICD IMPLANT N/A 06/02/2021   Procedure: ICD IMPLANT;  Surgeon: Duke Salvia, MD;  [facility]: [facility] INVASIVE CV LAB;  Service: Cardiovascular;  Laterality: N/A;   LEFT HEART CATH AND CORONARY ANGIOGRAPHY N/A 08/18/2020   Procedure: LEFT HEART CATH AND CORONARY ANGIOGRAPHY;  Surgeon: Swaziland, Peter M, MD;  Location: [facility] INVASIVE CV LAB;  Service: Cardiovascular;  Laterality: N/A;   TONSILLECTOMY     Social History:  reports that he has quit smoking. He has never used smokeless tobacco. He reports current alcohol use. He reports that he does not use drugs. Family History:  Family History  Problem Relation Age of Onset   Diabetes Mother    CAD Mother    Heart attack Mother        x 2   COPD Father    Colon cancer Neg Hx    Esophageal cancer Neg Hx    Rectal cancer Neg Hx    Stomach cancer Neg Hx      HOME MEDICATIONS: Allergies as of 11/28/2022   No Known Allergies      Medication List        Accurate as of November 28, 2022  8:11 AM. If you have any questions, ask your nurse or doctor.          acetaminophen 500 MG tablet Commonly known as: TYLENOL  Take 500 mg by mouth every 6 (six) hours as needed for moderate pain or headache.   carvedilol 12.5 MG tablet Commonly known as: COREG TAKE ONE TABLET BY MOUTH TWICE A DAY WITH A MEAL   Dexcom G7 Sensor Misc 1 Device by Does not apply route as directed.   diclofenac Sodium 1 % Gel Commonly known as: Voltaren Rub a small grape sized dollop into knee up to 3 times daily as needed.   Eliquis 5 MG Tabs tablet Generic drug: apixaban TAKE ONE TABLET BY MOUTH TWICE A DAY   Entresto 24-26 MG Generic drug: sacubitril-valsartan TAKE ONE TABLET BY MOUTH TWICE A DAY   ezetimibe 10 MG tablet Commonly known as: ZETIA Take 1 tablet (10 mg total) by mouth daily.   Farxiga 10 MG Tabs tablet Generic drug: dapagliflozin propanediol Take 1 tablet (10 mg  total) by mouth daily.   glucose blood test strip 1 each by Other route 2 (two) times daily. And lancets 2/day   Lantus SoloStar 100 UNIT/ML Solostar Pen Generic drug: insulin glargine Inject 50 Units into the skin every morning. And pen needles 1/day   Pen Needles 32G X 4 MM Misc 1 Package by Does not apply route daily in the afternoon.   Repatha SureClick 140 MG/ML Soaj Generic drug: Evolocumab INJECT 140MG  INTO THE SKIN EVERY 14 DAYS   sildenafil 100 MG tablet Commonly known as: Viagra Take 0.5-1 tablets (50-100 mg total) by mouth daily as needed for erectile dysfunction.   spironolactone 25 MG tablet Commonly known as: ALDACTONE TAKE 1/2 TABLET BY MOUTH DAILY   tirzepatide 5 MG/0.5ML Pen Commonly known as: MOUNJARO Inject 5 mg into the skin once a week.         OBJECTIVE:   Vital Signs: BP 118/74 (BP Location: Right Arm, Patient Position: Sitting, Cuff Size: Small)   Pulse 80   Ht 5\' 9"  (1.753 m)   Wt 174 lb (78.9 kg)   SpO2 98%   BMI 25.70 kg/m   Wt Readings from Last 3 Encounters:  11/28/22 174 lb (78.9 kg)  11/13/22 172 lb 9.6 oz (78.3 kg)  08/22/22 172 lb (78 kg)     Exam: General: Pt appears well and is in NAD  Neck: General: Supple without adenopathy. Thyroid: Thyroid size normal.  No goiter or nodules appreciated.   Lungs: Clear with good BS bilat   Heart: RRR   Extremities: No pretibial edema.   Neuro: MS is good with appropriate affect, pt is alert and Ox3    DM foot exam:06/18/2202  The skin of the feet is  without sores or ulcerations, dystrophic nails  The pedal pulses are 1+ on right and 1+ on left. The sensation is intact to a screening 5.07, 10 gram monofilament bilaterally          DATA REVIEWED:  Lab Results  Component Value Date   HGBA1C 8.8 (A) 06/18/2022   HGBA1C 10.9 (A) 03/08/2022   HGBA1C 8.3 (A) 09/06/2021    Latest Reference Range & Units 11/13/22 11:35  Sodium 135 - 145 mEq/L 135  Potassium 3.5 - 5.1 mEq/L  4.4  Chloride 96 - 112 mEq/L 101  CO2 19 - 32 mEq/L 23  Glucose 70 - 99 mg/dL 782 (H)  BUN 6 - 23 mg/dL 23  Creatinine 9.56 - 2.13 mg/dL 0.86  Calcium 8.4 - 57.8 mg/dL 9.4  Alkaline Phosphatase 39 - 117 U/L 75  Albumin 3.5 - 5.2 g/dL 4.5  AST 0 - 37  U/L 18  ALT 0 - 53 U/L 23  Total Protein 6.0 - 8.3 g/dL 6.9  Total Bilirubin 0.2 - 1.2 mg/dL 0.8  GFR >78.29 mL/min 64.01  (H): Data is abnormally high  In office  Bg 213 mg/dL    ASSESSMENT / PLAN / RECOMMENDATIONS:   1) Type 2 Diabetes Mellitus, Poorly controlled, With macrovascular complications - Most recent A1c of 10.3%. Goal A1c < 7.0 %.    - Pt continues with poorly controlled diabetes , his A1c has increased from 8.8% to 10.3% -Patient assures me compliance with his medication, but he has been noted with dietary indiscretions, for example at 2 AM he had a few chips and a piece of cake,, we again discussed the importance of low-carb diet and avoiding sugar sweetened beverages -We discussed microvascular complications of persistent hyperglycemia -I will increase Mounjaro and Lantus as below - Unable to get the dexcom   MEDICATIONS: Increase Lantus 54 units daily  Continue Farxiga 10 mg daily  Increase mounjaro 7.5 mg weekly   EDUCATION / INSTRUCTIONS: BG monitoring instructions: Patient is instructed to check his blood sugars 1 times a day, fasting . Call [facility] if: BG persistently < 70  I reviewed the Rule of 15 for the treatment of hypoglycemia in detail with the patient. Literature supplied.    2) Diabetic complications:  Eye: Does not have known diabetic retinopathy.  Neuro/ Feet: Does not have known diabetic peripheral neuropathy .  Renal: Patient does not have known baseline CKD. He   is  on an ACEI/ARB at present.      F/U in 3 months     Signed electronically by: Lyndle Herrlich, MD  [facility]  [facility] [address] [place] [place], [place] [address] Phone: [phone] FAX: [phone]   CC: Mliss Sax, MD [address] [place] [address] Phone: [phone]  Fax: [phone]  Return to Endocrinology clinic as below: Future Appointments  Date Time Provider Department Center  12/04/2022  7:15 AM [facility] DEVICE REMOTES [facility]  03/05/2023  7:15 AM [facility] DEVICE REMOTES [facility]  05/13/2023 10:00 AM Mliss Sax, MD [facility]  06/04/2023  7:15 AM [facility] DEVICE REMOTES [facility]  09/03/2023  7:15 AM [facility] DEVICE REMOTES [facility]  12/03/2023  7:15 AM [facility] DEVICE REMOTES [facility]  03/03/2024  7:15 AM [facility] DEVICE REMOTES [facility]  06/02/2024  7:15 AM [facility] DEVICE REMOTES [facility]  09/01/2024  7:15 AM [facility] DEVICE REMOTES [facility]

## 2022-12-04 ENCOUNTER — Ambulatory Visit (INDEPENDENT_AMBULATORY_CARE_PROVIDER_SITE_OTHER): Payer: Managed Care, Other (non HMO)

## 2022-12-04 DIAGNOSIS — I255 Ischemic cardiomyopathy: Secondary | ICD-10-CM | POA: Diagnosis not present

## 2022-12-04 LAB — CUP PACEART REMOTE DEVICE CHECK
Battery Remaining Longevity: 168 mo
Battery Remaining Percentage: 100 %
Brady Statistic RV Percent Paced: 0 %
Date Time Interrogation Session: 20241203060900
HighPow Impedance: 91 Ohm
Implantable Lead Connection Status: 753985
Implantable Lead Implant Date: 20230602
Implantable Lead Location: 753860
Implantable Lead Model: 138
Implantable Lead Serial Number: 303305
Implantable Pulse Generator Implant Date: 20230602
Lead Channel Impedance Value: 689 Ohm
Lead Channel Pacing Threshold Amplitude: 0.4 V
Lead Channel Pacing Threshold Pulse Width: 0.4 ms
Lead Channel Setting Pacing Amplitude: 2.5 V
Lead Channel Setting Pacing Pulse Width: 0.4 ms
Lead Channel Setting Sensing Sensitivity: 0.5 mV
Pulse Gen Serial Number: 217470

## 2022-12-13 ENCOUNTER — Other Ambulatory Visit: Payer: Self-pay | Admitting: Cardiology

## 2022-12-17 ENCOUNTER — Other Ambulatory Visit (HOSPITAL_COMMUNITY): Payer: Self-pay

## 2022-12-17 ENCOUNTER — Telehealth: Payer: Self-pay | Admitting: Cardiology

## 2022-12-17 ENCOUNTER — Telehealth: Payer: Self-pay | Admitting: Pharmacy Technician

## 2022-12-17 NOTE — Telephone Encounter (Signed)
Pt c/o medication issue:  1. Name of Medication:   Evolocumab (REPATHA SURECLICK) 140 MG/ML SOAJ   2. How are you currently taking this medication (dosage and times per day)?   3. Are you having a reaction (difficulty breathing--STAT)?   4. What is your medication issue?  Caller stated they will need prior authorization for patient to get Evolocumab (REPATHA SURECLICK) 140 MG/ML SOAJ .

## 2022-12-17 NOTE — Telephone Encounter (Signed)
Pharmacy Patient Advocate Encounter   Received notification from Pt Calls Messages that prior authorization for repatha is required/requested.   Insurance verification completed.   The patient is insured through [facility] [employer] .   Per test claim: PA required; PA submitted to above mentioned insurance via CoverMyMeds Key/confirmation #/EOC [id number] Status is pending

## 2022-12-20 NOTE — Telephone Encounter (Signed)
Patient calling to that the insurance has deny request and that our office needs to do an appeal. Please advise

## 2022-12-20 NOTE — Telephone Encounter (Signed)
Pharmacy Patient Advocate Encounter  Received notification from [facility] that Prior Authorization for repatha has been APPROVED from 12/17/22 to 12/20/23   PA #/Case ID/Reference #: [id number]

## 2023-02-12 ENCOUNTER — Other Ambulatory Visit: Payer: Self-pay | Admitting: Cardiology

## 2023-02-12 DIAGNOSIS — I255 Ischemic cardiomyopathy: Secondary | ICD-10-CM

## 2023-02-28 ENCOUNTER — Encounter: Payer: Self-pay | Admitting: Internal Medicine

## 2023-02-28 ENCOUNTER — Ambulatory Visit: Payer: Managed Care, Other (non HMO) | Admitting: Internal Medicine

## 2023-02-28 VITALS — BP 136/80 | HR 97 | Ht 69.0 in | Wt 168.2 lb

## 2023-02-28 DIAGNOSIS — N1831 Chronic kidney disease, stage 3a: Secondary | ICD-10-CM

## 2023-02-28 DIAGNOSIS — E1122 Type 2 diabetes mellitus with diabetic chronic kidney disease: Secondary | ICD-10-CM

## 2023-02-28 DIAGNOSIS — E1159 Type 2 diabetes mellitus with other circulatory complications: Secondary | ICD-10-CM

## 2023-02-28 DIAGNOSIS — E1139 Type 2 diabetes mellitus with other diabetic ophthalmic complication: Secondary | ICD-10-CM

## 2023-02-28 DIAGNOSIS — Z794 Long term (current) use of insulin: Secondary | ICD-10-CM

## 2023-02-28 DIAGNOSIS — E1165 Type 2 diabetes mellitus with hyperglycemia: Secondary | ICD-10-CM

## 2023-02-28 LAB — POCT GLUCOSE (DEVICE FOR HOME USE): POC Glucose: 115 mg/dL — AB (ref 70–99)

## 2023-02-28 LAB — POCT GLYCOSYLATED HEMOGLOBIN (HGB A1C): Hemoglobin A1C: 9.3 % — AB (ref 4.0–5.6)

## 2023-02-28 MED ORDER — LANTUS SOLOSTAR 100 UNIT/ML ~~LOC~~ SOPN: 50.0000 [IU] | PEN_INJECTOR | SUBCUTANEOUS | 4 refills | Status: DC

## 2023-02-28 MED ORDER — DAPAGLIFLOZIN PROPANEDIOL 10 MG PO TABS: 10.0000 mg | ORAL_TABLET | Freq: Every day | ORAL | 3 refills | Status: DC

## 2023-02-28 MED ORDER — GLIPIZIDE ER 5 MG PO TB24: 5.0000 mg | ORAL_TABLET | Freq: Every day | ORAL | 3 refills | Status: DC

## 2023-02-28 MED ORDER — PEN NEEDLES 32G X 4 MM MISC: 1.0000 | Freq: Every day | 3 refills | Status: DC

## 2023-02-28 MED ORDER — TIRZEPATIDE 10 MG/0.5ML ~~LOC~~ SOAJ: 10.0000 mg | SUBCUTANEOUS | 3 refills | Status: DC

## 2023-02-28 NOTE — Progress Notes (Signed)
 Name: John [name]  Age/ Sex: 63 y.o., male   MRN/ DOB: [id number], [birth year]     PCP: John Sax, MD   Reason for Endocrinology Evaluation: Type 2 Diabetes Mellitus  Initial Endocrine Consultative Visit: 12/11/2017    PATIENT IDENTIFIER: John [name] is a 63 y.o. male with a past medical history of T2DM, Hx CVA, CHF and CAD. The patient has followed with Endocrinology clinic since 12/11/20219 for consultative assistance with management of his diabetes.     DIABETIC HISTORY:  [name] was diagnosed with DM 2004, and started insulin therapy approximately 2012 . His hemoglobin A1c has ranged from 6.3% in 2022, peaking at 8.4% in 23.    He was followed up by Dr. Everardo [name] between 2019 until March 2023  On his initial visit with me  09/2021 he had an A1c 8.3%, he was on Trulicity, Farxiga and Lantus   Switched Trulicity to [facility]  06/2022  SUBJECTIVE:   During the last visit (11/28/2022): A1c 10.3%   Today (02/28/2023): [name] is here for follow-up on diabetes management.  He checks his blood sugars occasionally. The patient has not had hypoglycemic episodes since the last clinic visit   Patient is s/p ICD placement on 06/02/2021, he continues to follow-up with cardiology  Denies nausea, vomiting  Has occasional changes in bowel movement Patient with occasional tingling on the hands, no shoulder or neck pains  HOME DIABETES REGIMEN:  Farxiga 10 mg daily Mounjaro 7.5 mg weekly  ( Sundays)  Lantus 54 units daily      Statin: no, patient on Repatha ACE-I/ARB: Yes    METER DOWNLOAD SUMMARY: Did not bring     DIABETIC COMPLICATIONS: Microvascular complications:  CKD III Denies: neuropathy, retinopathy  Last Eye Exam: Completed 08/2021  Macrovascular complications:  CVA (05/2020), CAD Denies: PVD   HISTORY:  Past Medical History:  Past Medical History:  Diagnosis Date   Arthritis    Diabetes mellitus without complication  (HCC)    Hyperlipidemia    Hypertension    Stroke (HCC) 05/2020   Past Surgical History:  Past Surgical History:  Procedure Laterality Date   ICD IMPLANT N/A 06/02/2021   Procedure: ICD IMPLANT;  Surgeon: John Salvia, MD;  Location: [facility] INVASIVE CV LAB;  Service: Cardiovascular;  Laterality: N/A;   LEFT HEART CATH AND CORONARY ANGIOGRAPHY N/A 08/18/2020   Procedure: LEFT HEART CATH AND CORONARY ANGIOGRAPHY;  Surgeon: Swaziland, Peter M, MD;  Location: [facility] INVASIVE CV LAB;  Service: Cardiovascular;  Laterality: N/A;   TONSILLECTOMY     Social History:  reports that he has quit smoking. He has never used smokeless tobacco. He reports current alcohol use. He reports that he does not use drugs. Family History:  Family History  Problem Relation Age of Onset   Diabetes Mother    CAD Mother    Heart attack Mother        x 2   COPD Father    Colon cancer Neg Hx    Esophageal cancer Neg Hx    Rectal cancer Neg Hx    Stomach cancer Neg Hx      HOME MEDICATIONS: Allergies as of 02/28/2023   No Known Allergies      Medication List        Accurate as of February 28, 2023  7:56 AM. If you have any questions, ask your nurse or doctor.          STOP taking these medications  Dexcom G7 Sensor Misc Stopped by: John [name]       TAKE these medications    acetaminophen 500 MG tablet Commonly known as: TYLENOL Take 500 mg by mouth every 6 (six) hours as needed for moderate pain or headache.   carvedilol 12.5 MG tablet Commonly known as: COREG TAKE ONE TABLET BY MOUTH TWICE A DAY WITH A MEAL   dapagliflozin propanediol 10 MG Tabs tablet Commonly known as: Farxiga Take 1 tablet (10 mg total) by mouth daily.   diclofenac Sodium 1 % Gel Commonly known as: Voltaren Rub a small grape sized dollop into knee up to 3 times daily as needed.   Eliquis 5 MG Tabs tablet Generic drug: apixaban TAKE ONE TABLET BY MOUTH TWICE A DAY   Entresto 24-26 MG Generic drug:  sacubitril-valsartan TAKE 1 TABLET BY MOUTH TWICE A DAY   ezetimibe 10 MG tablet Commonly known as: ZETIA Take 1 tablet (10 mg total) by mouth daily.   glucose blood test strip 1 each by Other route 2 (two) times daily. And lancets 2/day   Lantus SoloStar 100 UNIT/ML Solostar Pen Generic drug: insulin glargine Inject 54 Units into the skin every morning. And pen needles 1/day   Pen Needles 32G X 4 MM Misc 1 Package by Does not apply route daily in the afternoon.   Repatha SureClick 140 MG/ML Soaj Generic drug: Evolocumab INJECT 1 MILLILITER UNDER THE SKIN EVERY 14 DAYS   sildenafil 100 MG tablet Commonly known as: Viagra Take 0.5-1 tablets (50-100 mg total) by mouth daily as needed for erectile dysfunction.   spironolactone 25 MG tablet Commonly known as: ALDACTONE TAKE 1/2 TABLET BY MOUTH DAILY   tirzepatide 7.5 MG/0.5ML Pen Commonly known as: MOUNJARO Inject 7.5 mg into the skin once a week.         OBJECTIVE:   Vital Signs: BP 136/80 (BP Location: Left Arm, Patient Position: Sitting, Cuff Size: Small)   Pulse 97   Ht 5\' 9"  (1.753 [name])   Wt 168 lb 3.2 oz (76.3 kg)   SpO2 99%   BMI 24.84 kg/[name]   Wt Readings from Last 3 Encounters:  02/28/23 168 lb 3.2 oz (76.3 kg)  11/28/22 174 lb (78.9 kg)  11/13/22 172 lb 9.6 oz (78.3 kg)     Exam: General: Pt appears well and is in NAD  Lungs: Clear with good BS bilat   Heart: RRR   Neuro: MS is good with appropriate affect, pt is alert and Ox3    DM foot exam:06/18/2202  The skin of the feet is  without sores or ulcerations, dystrophic nails  The pedal pulses are 1+ on right and 1+ on left. The sensation is intact to a screening 5.07, 10 gram monofilament bilaterally          DATA REVIEWED:  Lab Results  Component Value Date   HGBA1C 10.3 (A) 11/28/2022   HGBA1C 8.8 (A) 06/18/2022   HGBA1C 10.9 (A) 03/08/2022    Latest Reference Range & Units 11/13/22 11:35  Sodium 135 - 145 mEq/L 135  Potassium 3.5 -  5.1 mEq/L 4.4  Chloride 96 - 112 mEq/L 101  CO2 19 - 32 mEq/L 23  Glucose 70 - 99 mg/dL 914 (H)  BUN 6 - 23 mg/dL 23  Creatinine 7.82 - 9.56 mg/dL 2.13  Calcium 8.4 - 08.6 mg/dL 9.4  Alkaline Phosphatase 39 - 117 U/L 75  Albumin 3.5 - 5.2 g/dL 4.5  AST 0 - 37 U/L 18  ALT 0 - 53 U/L 23  Total Protein 6.0 - 8.3 g/dL 6.9  Total Bilirubin 0.2 - 1.2 mg/dL 0.8  GFR >82.95 mL/min 64.01    In office  Bg 115 mg/dL    ASSESSMENT / PLAN / RECOMMENDATIONS:   1) Type 2 Diabetes Mellitus, Poorly controlled, With macrovascular complications - Most recent A1c of 9.3%. Goal A1c < 7.0 %.    - Pt continues with poorly controlled diabetes -I expected more improvement in his A1c with increasing Mounjaro and Lantus -His office BG 115 Mg/DL, this is fasting, indicates his Lantus is appropriate and most likely his hypoglycemia is due to postprandial hyperglycemia -He did have technical difficulty with using the Dexcom -I have encouraged him to check his glucose at home in the fasting status -I have recommended adding glipizide before supper as that is his consistent meal of the day.  Patient works third shift -Caution against hypoglycemia and to contact our office -I will increase Mounjaro and decrease Lantus as below    MEDICATIONS: Start glipizide 5 mg XL, 1 tablet daily Decrease Lantus 50 units daily  Continue Farxiga 10 mg daily  Increase mounjaro 10 mg weekly   EDUCATION / INSTRUCTIONS: BG monitoring instructions: Patient is instructed to check his blood sugars 1 times a day, fasting . Call [facility] [facility] if: BG persistently < 70  I reviewed the Rule of 15 for the treatment of hypoglycemia in detail with the patient. Literature supplied.    2) Diabetic complications:  Eye: Does not have known diabetic retinopathy.  Neuro/ Feet: Does not have known diabetic peripheral neuropathy .  Renal: Patient does not have known baseline CKD. He   is  on an ACEI/ARB at present.       F/U in 4 months     Signed electronically by: Lyndle Herrlich, MD  [facility]  [facility] [address] [place] [place], [place] [address] Phone: [phone] FAX: [phone]   CC: John Sax, MD [address] [place] [address] Phone: [phone]  Fax: [phone]  Return to Endocrinology clinic as below: Future Appointments  Date Time Provider Department Center  03/05/2023  7:15 AM [facility] DEVICE REMOTES [facility]  05/13/2023 10:00 AM John Sax, MD [facility]  06/04/2023  7:15 AM [facility] DEVICE REMOTES [facility]  09/03/2023  7:15 AM [facility] DEVICE REMOTES [facility]  12/03/2023  7:15 AM [facility] DEVICE REMOTES [facility]  03/03/2024  7:15 AM [facility] DEVICE REMOTES [facility]  06/02/2024  7:15 AM [facility] DEVICE REMOTES [facility]  09/01/2024  7:15 AM [facility] DEVICE REMOTES [facility]

## 2023-02-28 NOTE — Patient Instructions (Addendum)
 Increase  Mounjaro 10 mg once a WEEK Start Glipizide 5 mg XL 1 tablet before Supper  Continue Farxiga 10 mg, 1 tablet daily  Decrease  Lantus  50 units once  daily      HOW TO TREAT LOW BLOOD SUGARS (Blood sugar LESS THAN 70 MG/DL) Please follow the RULE OF 15 for the treatment of hypoglycemia treatment (when your (blood sugars are less than 70 mg/dL)   STEP 1: Take 15 grams of carbohydrates when your blood sugar is low, which includes:  3-4 GLUCOSE TABS  OR 3-4 OZ OF JUICE OR REGULAR SODA OR ONE TUBE OF GLUCOSE GEL    STEP 2: RECHECK blood sugar in 15 MINUTES STEP 3: If your blood sugar is still low at the 15 minute recheck --> then, go back to STEP 1 and treat AGAIN with another 15 grams of carbohydrates.

## 2023-03-05 ENCOUNTER — Ambulatory Visit (INDEPENDENT_AMBULATORY_CARE_PROVIDER_SITE_OTHER): Payer: Managed Care, Other (non HMO)

## 2023-03-05 DIAGNOSIS — I5022 Chronic systolic (congestive) heart failure: Secondary | ICD-10-CM

## 2023-03-05 DIAGNOSIS — I255 Ischemic cardiomyopathy: Secondary | ICD-10-CM | POA: Diagnosis not present

## 2023-03-07 LAB — CUP PACEART REMOTE DEVICE CHECK
Battery Remaining Longevity: 162 mo
Battery Remaining Percentage: 100 %
Brady Statistic RV Percent Paced: 0 %
Date Time Interrogation Session: 20250304062200
HighPow Impedance: 81 Ohm
Implantable Lead Connection Status: 753985
Implantable Lead Implant Date: 20230602
Implantable Lead Location: 753860
Implantable Lead Model: 138
Implantable Lead Serial Number: 303305
Implantable Pulse Generator Implant Date: 20230602
Lead Channel Impedance Value: 681 Ohm
Lead Channel Pacing Threshold Amplitude: 0.5 V
Lead Channel Pacing Threshold Pulse Width: 0.4 ms
Lead Channel Setting Pacing Amplitude: 2.5 V
Lead Channel Setting Pacing Pulse Width: 0.4 ms
Lead Channel Setting Sensing Sensitivity: 0.5 mV
Pulse Gen Serial Number: 217470

## 2023-04-09 NOTE — Addendum Note (Signed)
 Addended by: Geralyn Flash D on: 04/09/2023 09:08 AM   Modules accepted: Orders

## 2023-04-09 NOTE — Progress Notes (Signed)
 Remote ICD transmission.

## 2023-04-13 ENCOUNTER — Encounter: Payer: Self-pay | Admitting: Internal Medicine

## 2023-05-13 ENCOUNTER — Encounter: Payer: Self-pay | Admitting: Family Medicine

## 2023-05-13 ENCOUNTER — Ambulatory Visit: Payer: Managed Care, Other (non HMO) | Admitting: Family Medicine

## 2023-05-13 VITALS — BP 128/84 | HR 90 | Temp 98.6°F | Ht 69.0 in | Wt 170.4 lb

## 2023-05-13 DIAGNOSIS — K4090 Unilateral inguinal hernia, without obstruction or gangrene, not specified as recurrent: Secondary | ICD-10-CM | POA: Insufficient documentation

## 2023-05-13 DIAGNOSIS — I10 Essential (primary) hypertension: Secondary | ICD-10-CM | POA: Diagnosis not present

## 2023-05-13 DIAGNOSIS — E7800 Pure hypercholesterolemia, unspecified: Secondary | ICD-10-CM | POA: Diagnosis not present

## 2023-05-13 DIAGNOSIS — Z1211 Encounter for screening for malignant neoplasm of colon: Secondary | ICD-10-CM

## 2023-05-13 LAB — LIPID PANEL
Cholesterol: 93 mg/dL (ref 0–200)
HDL: 53 mg/dL (ref >39.00)
LDL Cholesterol: 29 mg/dL (ref 0–99)
NonHDL: 39.95
Total CHOL/HDL Ratio: 2
Triglycerides: 56 mg/dL (ref 0.0–149.0)
VLDL: 11.2 mg/dL (ref 0.0–40.0)

## 2023-05-13 LAB — COMPREHENSIVE METABOLIC PANEL WITH GFR
ALT: 18 U/L (ref 0–53)
AST: 17 U/L (ref 0–37)
Albumin: 4.4 g/dL (ref 3.5–5.2)
Alkaline Phosphatase: 63 U/L (ref 39–117)
BUN: 27 mg/dL — ABNORMAL HIGH (ref 6–23)
CO2: 24 meq/L (ref 19–32)
Calcium: 9.2 mg/dL (ref 8.4–10.5)
Chloride: 105 meq/L (ref 96–112)
Creatinine, Ser: 1.32 mg/dL (ref 0.40–1.50)
GFR: 58.03 mL/min — ABNORMAL LOW (ref >60.00)
Glucose, Bld: 182 mg/dL — ABNORMAL HIGH (ref 70–99)
Potassium: 4.5 meq/L (ref 3.5–5.1)
Sodium: 138 meq/L (ref 135–145)
Total Bilirubin: 0.5 mg/dL (ref 0.2–1.2)
Total Protein: 7.1 g/dL (ref 6.0–8.3)

## 2023-05-13 NOTE — Progress Notes (Signed)
 Established Patient Office Visit   Subjective:  Patient ID: John [name], male    DOB: [birth year]  Age: 63 y.o. MRN: [id number]  Chief Complaint  Patient presents with   Medical Management of Chronic Issues    6 month follow up. Pt is fasting.   Hernia    Possible hernia on left side of lower abdominal or groin. Non painful.     HPI Encounter Diagnoses  Name Primary?   Elevated LDL cholesterol level Yes   Screening for colon cancer    Essential hypertension    Unilateral inguinal hernia without obstruction or gangrene, recurrence not specified    For follow-up of above.  He has noted a bulge in his groin area.  Claims compliance with all medications.   Review of Systems  Constitutional: Negative.   HENT: Negative.    Eyes:  Negative for blurred vision, discharge and redness.  Respiratory: Negative.    Cardiovascular: Negative.   Gastrointestinal:  Negative for abdominal pain.  Genitourinary: Negative.   Musculoskeletal: Negative.  Negative for myalgias.  Skin:  Negative for rash.  Neurological:  Negative for tingling, loss of consciousness and weakness.  Endo/Heme/Allergies:  Negative for polydipsia.     Current Outpatient Medications:    acetaminophen  (TYLENOL ) 500 MG tablet, Take 500 mg by mouth every 6 (six) hours as needed for moderate pain or headache., Disp: , Rfl:    carvedilol  (COREG ) 12.5 MG tablet, TAKE ONE TABLET BY MOUTH TWICE A DAY WITH A MEAL, Disp: 180 tablet, Rfl: 3   dapagliflozin  propanediol (FARXIGA ) 10 MG TABS tablet, Take 1 tablet (10 mg total) by mouth daily., Disp: 90 tablet, Rfl: 3   diclofenac  Sodium (VOLTAREN ) 1 % GEL, Rub a small grape sized dollop into knee up to 3 times daily as needed., Disp: 350 g, Rfl: 1   ELIQUIS  5 MG TABS tablet, TAKE ONE TABLET BY MOUTH TWICE A DAY, Disp: 180 tablet, Rfl: 1   Evolocumab  (REPATHA  SURECLICK) 140 MG/ML SOAJ, INJECT 1 MILLILITER UNDER THE SKIN EVERY 14 DAYS, Disp: 6 mL, Rfl: 3   ezetimibe  (ZETIA ) 10  MG tablet, Take 1 tablet (10 mg total) by mouth daily., Disp: 90 tablet, Rfl: 3   glipiZIDE  (GLUCOTROL  XL) 5 MG 24 hr tablet, Take 1 tablet (5 mg total) by mouth daily with breakfast., Disp: 90 tablet, Rfl: 3   glucose blood test strip, 1 each by Other route 2 (two) times daily. And lancets 2/day, Disp: 200 each, Rfl: 3   insulin  glargine (LANTUS  SOLOSTAR) 100 UNIT/ML Solostar Pen, Inject 50 Units into the skin every morning. And pen needles 1/day, Disp: 45 mL, Rfl: 4   Insulin  Pen Needle (PEN NEEDLES) 32G X 4 MM MISC, 1 Package by Does not apply route daily in the afternoon., Disp: 100 each, Rfl: 3   sacubitril-valsartan (ENTRESTO ) 24-26 MG, TAKE 1 TABLET BY MOUTH TWICE A DAY, Disp: 60 tablet, Rfl: 3   sildenafil  (VIAGRA ) 100 MG tablet, Take 0.5-1 tablets (50-100 mg total) by mouth daily as needed for erectile dysfunction., Disp: 5 tablet, Rfl: 2   spironolactone  (ALDACTONE ) 25 MG tablet, TAKE 1/2 TABLET BY MOUTH DAILY, Disp: 45 tablet, Rfl: 3   tirzepatide  (MOUNJARO ) 10 MG/0.5ML Pen, Inject 10 mg into the skin once a week., Disp: 6 mL, Rfl: 3  Current Facility-Administered Medications:    sodium chloride  flush (NS) 0.9 % injection 3 mL, 3 mL, Intravenous, Q12H, Meng, Hao, PA   Objective:     BP 128/84 (  Cuff Size: Normal)   Pulse 90   Temp 98.6 F (37 C) (Temporal)   Ht 5\' 9"  (1.753 m)   Wt 170 lb 6.4 oz (77.3 kg)   SpO2 98%   BMI 25.16 kg/m  BP Readings from Last 3 Encounters:  05/13/23 128/84  02/28/23 136/80  11/28/22 118/74   Wt Readings from Last 3 Encounters:  05/13/23 170 lb 6.4 oz (77.3 kg)  02/28/23 168 lb 3.2 oz (76.3 kg)  11/28/22 174 lb (78.9 kg)      Physical Exam Constitutional:      General: He is not in acute distress.    Appearance: Normal appearance. He is not ill-appearing, toxic-appearing or diaphoretic.  HENT:     Head: Normocephalic and atraumatic.     Right Ear: External ear normal.     Left Ear: External ear normal.     Mouth/Throat:     Mouth:  Mucous membranes are moist.     Pharynx: Oropharynx is clear. No oropharyngeal exudate or posterior oropharyngeal erythema.  Eyes:     General: No scleral icterus.       Right eye: No discharge.        Left eye: No discharge.     Extraocular Movements: Extraocular movements intact.     Conjunctiva/sclera: Conjunctivae normal.     Pupils: Pupils are equal, round, and reactive to light.  Cardiovascular:     Rate and Rhythm: Normal rate and regular rhythm.  Pulmonary:     Effort: Pulmonary effort is normal. No respiratory distress.     Breath sounds: Normal breath sounds.  Abdominal:     General: Bowel sounds are normal.     Tenderness: There is no abdominal tenderness. There is no guarding.     Hernia: A hernia is present. Hernia is present in the left inguinal area. There is no hernia in the right inguinal area.  Genitourinary:    Penis: Uncircumcised. No phimosis, paraphimosis, hypospadias, erythema, tenderness, discharge, swelling or lesions.      Testes:        Right: Mass, tenderness or swelling not present.        Left: Mass, tenderness or swelling not present.     Epididymis:     Right: Not inflamed or enlarged.     Left: Not inflamed or enlarged.  Musculoskeletal:     Cervical back: No rigidity or tenderness.  Lymphadenopathy:     Lower Body: No right inguinal adenopathy. No left inguinal adenopathy.  Skin:    General: Skin is warm and dry.  Neurological:     Mental Status: He is alert and oriented to person, place, and time.  Psychiatric:        Mood and Affect: Mood normal.        Behavior: Behavior normal.      No results found for any visits on 05/13/23.    The ASCVD Risk score (Arnett DK, et al., 2019) failed to calculate for the following reasons:   Risk score cannot be calculated because patient has a medical history suggesting prior/existing ASCVD    Assessment & Plan:   Elevated LDL cholesterol level -     Comprehensive metabolic panel with GFR -      Lipid panel  Screening for colon cancer -     Ambulatory referral to Gastroenterology  Essential hypertension -     Comprehensive metabolic panel with GFR  Unilateral inguinal hernia without obstruction or gangrene, recurrence not specified -  Ambulatory referral to General Surgery    Return in about 6 months (around 11/13/2023).  Referral for evaluation of left inguinal hernia by general surgery and for colonoscopy.  Urged compliance with recommendations made by endocrinology for better diabetes control.  Due for right knee replacement and probable left inguinal hernia repair.  He will schedule appointment with his ophthalmologist.  Continue current medications.  Tonna Frederic, MD

## 2023-05-14 ENCOUNTER — Ambulatory Visit: Payer: Self-pay

## 2023-06-04 ENCOUNTER — Ambulatory Visit (INDEPENDENT_AMBULATORY_CARE_PROVIDER_SITE_OTHER): Payer: Managed Care, Other (non HMO)

## 2023-06-04 DIAGNOSIS — I255 Ischemic cardiomyopathy: Secondary | ICD-10-CM | POA: Diagnosis not present

## 2023-06-04 LAB — CUP PACEART REMOTE DEVICE CHECK
Battery Remaining Longevity: 156 mo
Battery Remaining Percentage: 100 %
Brady Statistic RV Percent Paced: 0 %
Date Time Interrogation Session: 20250603043800
HighPow Impedance: 79 Ohm
Implantable Lead Connection Status: 753985
Implantable Lead Implant Date: 20230602
Implantable Lead Location: 753860
Implantable Lead Model: 138
Implantable Lead Serial Number: 303305
Implantable Pulse Generator Implant Date: 20230602
Lead Channel Impedance Value: 567 Ohm
Lead Channel Pacing Threshold Amplitude: 0.5 V
Lead Channel Pacing Threshold Pulse Width: 0.4 ms
Lead Channel Setting Pacing Amplitude: 2.5 V
Lead Channel Setting Pacing Pulse Width: 0.4 ms
Lead Channel Setting Sensing Sensitivity: 0.5 mV
Pulse Gen Serial Number: 217470

## 2023-06-06 ENCOUNTER — Encounter: Payer: Self-pay | Admitting: Cardiology

## 2023-06-06 ENCOUNTER — Telehealth: Payer: Self-pay

## 2023-06-06 NOTE — Telephone Encounter (Signed)
..     Pre-operative Risk Assessment    Patient Name: John [name]  DOB: January 12, 1960 MRN: [id number]   Date of last office visit: 08/22/22 TILLERY/ 06/07/22 MONGE Date of next office visit: NONE   Request for Surgical Clearance    Procedure:  HERNIA SURGERY  Date of Surgery:  Clearance TBD                                Surgeon:  DR Alden Humphrey Surgeon's Group or Practice Name:  [facility] Phone number:  [phone] Fax number:  [phone]   Type of Clearance Requested:   - Medical  - Pharmacy:  Hold Apixaban  (Eliquis )     Type of Anesthesia:  General    Additional requests/questions:    Montel Antu   06/06/2023, 1:50 PM

## 2023-06-11 NOTE — Telephone Encounter (Signed)
 Patient with diagnosis of prior CVA on Eliquis  for anticoagulation.    Procedure:  HERNIA SURGERY   Date of Surgery:  Clearance TBD  CrCl 63 Platelet count 295  Per office protocol, patient can hold Eliquis  for 2 days prior to procedure.   Patient will not need bridging with Lovenox (enoxaparin) around procedure.  **This guidance is not considered finalized until pre-operative APP has relayed final recommendations.**

## 2023-06-11 NOTE — Telephone Encounter (Signed)
1st attempt to reach pt regarding surgical clearance and the need for an IN OFFICE appointment.  Left pt a detailed message to call back and get that scheduled.

## 2023-06-11 NOTE — Telephone Encounter (Signed)
   Name: John [name]  DOB: 12/11/1960  MRN: [id number]  Primary Cardiologist: Alexandria Angel, MD  Chart reviewed as part of pre-operative protocol coverage. Because of [name]'s past medical history and time since last visit, he will require a follow-up in-office visit in order to better assess preoperative cardiovascular risk.  Pre-op covering staff: - Please schedule appointment and call patient to inform them. If patient already had an upcoming appointment within acceptable timeframe, please add "pre-op clearance" to the appointment notes so provider is aware. - Please contact requesting surgeon's office via preferred method (i.e, phone, fax) to inform them of need for appointment prior to surgery.  Per office protocol, patient can hold Eliquis  for 2 days prior to procedure.   Patient will not need bridging with Lovenox (enoxaparin) around procedure.  Ava Boatman, NP  06/11/2023, 8:14 AM

## 2023-06-13 NOTE — Telephone Encounter (Signed)
 Could not leave message as recording states VM is not activated.   I will send update to requesting office that we are trying to reach the pt to schedule an appt in office for preop clearance.   Pt needs to call our office [phone] and schedule appt in office for preop clearance.

## 2023-06-14 ENCOUNTER — Ambulatory Visit: Payer: Self-pay | Admitting: Cardiology

## 2023-06-17 NOTE — Telephone Encounter (Signed)
 3rd attempt: Tried calling pt and voicemail not setup. Pt needs IN OFFICE Preop appt.

## 2023-06-28 ENCOUNTER — Encounter: Payer: Self-pay | Admitting: Family Medicine

## 2023-07-08 ENCOUNTER — Ambulatory Visit: Payer: Managed Care, Other (non HMO) | Admitting: Internal Medicine

## 2023-07-08 ENCOUNTER — Encounter: Payer: Self-pay | Admitting: Internal Medicine

## 2023-07-08 VITALS — BP 124/72 | HR 81 | Ht 69.0 in | Wt 168.4 lb

## 2023-07-08 DIAGNOSIS — E1122 Type 2 diabetes mellitus with diabetic chronic kidney disease: Secondary | ICD-10-CM | POA: Diagnosis not present

## 2023-07-08 DIAGNOSIS — Z794 Long term (current) use of insulin: Secondary | ICD-10-CM

## 2023-07-08 DIAGNOSIS — N1831 Chronic kidney disease, stage 3a: Secondary | ICD-10-CM

## 2023-07-08 DIAGNOSIS — E1159 Type 2 diabetes mellitus with other circulatory complications: Secondary | ICD-10-CM

## 2023-07-08 DIAGNOSIS — E1165 Type 2 diabetes mellitus with hyperglycemia: Secondary | ICD-10-CM | POA: Diagnosis not present

## 2023-07-08 LAB — POCT GLYCOSYLATED HEMOGLOBIN (HGB A1C): Hemoglobin A1C: 8.1 % — AB (ref 4.0–5.6)

## 2023-07-08 LAB — POCT GLUCOSE (DEVICE FOR HOME USE): POC Glucose: 142 mg/dL — AB (ref 70–99)

## 2023-07-08 MED ORDER — TIRZEPATIDE 12.5 MG/0.5ML ~~LOC~~ SOAJ: 12.5000 mg | SUBCUTANEOUS | 3 refills | Status: DC

## 2023-07-08 MED ORDER — LANTUS SOLOSTAR 100 UNIT/ML ~~LOC~~ SOPN: 45.0000 [IU] | PEN_INJECTOR | SUBCUTANEOUS | 4 refills | Status: DC

## 2023-07-08 MED ORDER — GLIPIZIDE ER 10 MG PO TB24: 10.0000 mg | ORAL_TABLET | Freq: Every day | ORAL | 3 refills | Status: DC

## 2023-07-08 NOTE — Progress Notes (Signed)
 Name: John [name]  Age/ Sex: 63 y.o., male   MRN/ DOB: [id number], 31-Aug-1960     PCP: Berneta Elsie Sayre, MD   Reason for Endocrinology Evaluation: Type 2 Diabetes Mellitus  Initial Endocrine Consultative Visit: 12/11/2017    PATIENT IDENTIFIER: John [name] is a 63 y.o. male with a past medical history of T2DM, Hx CVA, CHF and CAD. The patient has followed with Endocrinology clinic since 12/11/20219 for consultative assistance with management of his diabetes.     DIABETIC HISTORY:  John [name] was diagnosed with DM 2004, and started insulin  therapy approximately 2012 . His hemoglobin A1c has ranged from 6.3% in 2022, peaking at 8.4% in 23.    He was followed up by Dr. Kassie between 2019 until March 2023  On his initial visit with me  09/2021 he had an A1c 8.3%, he was on Trulicity , Farxiga  and Lantus    Switched Trulicity  to Mounjaro   06/2022  Started glipizide  02/2023 with an A1c of 9.3%  SUBJECTIVE:   During the last visit (02/28/2023): A1c 9.3%   Today (07/08/2023): John [name] is here for follow-up on diabetes management.  He checks his blood sugars occasionally. The patient has not had hypoglycemic episodes since the last clinic visit   Patient is s/p ICD placement on 06/02/2021, he continues to follow-up with cardiology  He was evaluated by surgery for evaluation of unilateral inguinal hernia Wife is alcoholic   Denies nausea, vomiting  Has noted mild constipation, has been on fiber supplements    HOME DIABETES REGIMEN:  Glipizide  5 mg XL, 1 tablet daily Farxiga  10 mg daily Mounjaro  10 mg weekly Lantus  50 units daily     Statin: no, patient on Repatha  ACE-I/ARB: Yes    METER DOWNLOAD SUMMARY: Did not bring     DIABETIC COMPLICATIONS: Microvascular complications:  CKD III Denies: neuropathy, retinopathy  Last Eye Exam: Completed 08/2021  Macrovascular complications:  CVA (05/2020), CAD Denies: PVD   HISTORY:  Past Medical  History:  Past Medical History:  Diagnosis Date   Arthritis    Diabetes mellitus without complication (HCC)    Hyperlipidemia    Hypertension    Stroke (HCC) 05/2020   Past Surgical History:  Past Surgical History:  Procedure Laterality Date   ICD IMPLANT N/A 06/02/2021   Procedure: ICD IMPLANT;  Surgeon: Fernande Elspeth BROCKS, MD;  [facility] INVASIVE CV LAB;  Service: Cardiovascular;  Laterality: N/A;   LEFT HEART CATH AND CORONARY ANGIOGRAPHY N/A 08/18/2020   Procedure: LEFT HEART CATH AND CORONARY ANGIOGRAPHY;  Surgeon: [place], Peter M, MD;  Location: [facility] INVASIVE CV LAB;  Service: Cardiovascular;  Laterality: N/A;   TONSILLECTOMY     Social History:  reports that he has quit smoking. He has never used smokeless tobacco. He reports current alcohol use. He reports that he does not use drugs. Family History:  Family History  Problem Relation Age of Onset   Diabetes Mother    CAD Mother    Heart attack Mother        x 2   COPD Father    Colon cancer Neg Hx    Esophageal cancer Neg Hx    Rectal cancer Neg Hx    Stomach cancer Neg Hx      HOME MEDICATIONS: Allergies as of 07/08/2023   No Known Allergies      Medication List        Accurate as of July 08, 2023  6:59 AM. If you have  any questions, ask your nurse or doctor.          acetaminophen  500 MG tablet Commonly known as: TYLENOL  Take 500 mg by mouth every 6 (six) hours as needed for moderate pain or headache.   carvedilol  12.5 MG tablet Commonly known as: COREG  TAKE ONE TABLET BY MOUTH TWICE A DAY WITH A MEAL   dapagliflozin  propanediol 10 MG Tabs tablet Commonly known as: Farxiga  Take 1 tablet (10 mg total) by mouth daily.   diclofenac  Sodium 1 % Gel Commonly known as: Voltaren  Rub a small grape sized dollop into knee up to 3 times daily as needed.   Eliquis  5 MG Tabs tablet Generic drug: apixaban  TAKE ONE TABLET BY MOUTH TWICE A DAY   Entresto  24-26 MG Generic drug: sacubitril-valsartan TAKE 1 TABLET  BY MOUTH TWICE A DAY   ezetimibe  10 MG tablet Commonly known as: ZETIA  Take 1 tablet (10 mg total) by mouth daily.   glipiZIDE  5 MG 24 hr tablet Commonly known as: GLUCOTROL  XL Take 1 tablet (5 mg total) by mouth daily with breakfast.   glucose blood test strip 1 each by Other route 2 (two) times daily. And lancets 2/day   Lantus  SoloStar 100 UNIT/ML Solostar Pen Generic drug: insulin  glargine Inject 50 Units into the skin every morning. And pen needles 1/day   Pen Needles 32G X 4 MM Misc 1 Package by Does not apply route daily in the afternoon.   Repatha  SureClick 140 MG/ML Soaj Generic drug: Evolocumab  INJECT 1 MILLILITER UNDER THE SKIN EVERY 14 DAYS   sildenafil  100 MG tablet Commonly known as: Viagra  Take 0.5-1 tablets (50-100 mg total) by mouth daily as needed for erectile dysfunction.   spironolactone  25 MG tablet Commonly known as: ALDACTONE  TAKE 1/2 TABLET BY MOUTH DAILY   tirzepatide  10 MG/0.5ML Pen Commonly known as: MOUNJARO  Inject 10 mg into the skin once a week.         OBJECTIVE:   Vital Signs: There were no vitals taken for this visit.  Wt Readings from Last 3 Encounters:  05/13/23 170 lb 6.4 oz (77.3 kg)  02/28/23 168 lb 3.2 oz (76.3 kg)  11/28/22 174 lb (78.9 kg)     Exam: General: Pt appears well and is in NAD  Lungs: Clear with good BS bilat   Heart: RRR   Neuro: MS is good with appropriate affect, pt is alert and Ox3    DM foot exam: 07/08/2023  The skin of the feet is  without sores or ulcerations, dystrophic nails  The pedal pulses are 1+ on right and 1+ on left. The sensation is intact to a screening 5.07, 10 gram monofilament bilaterally          DATA REVIEWED:  Lab Results  Component Value Date   HGBA1C 9.3 (A) 02/28/2023   HGBA1C 10.3 (A) 11/28/2022   HGBA1C 8.8 (A) 06/18/2022    Latest Reference Range & Units 05/13/23 10:40  Comprehensive metabolic panel with GFR  Rpt !  Sodium 135 - 145 mEq/L 138  Potassium 3.5  - 5.1 mEq/L 4.5  Chloride 96 - 112 mEq/L 105  CO2 19 - 32 mEq/L 24  Glucose 70 - 99 mg/dL 817 (H)  BUN 6 - 23 mg/dL 27 (H)  Creatinine 9.59 - 1.50 mg/dL 8.67  Calcium  8.4 - 10.5 mg/dL 9.2  Alkaline Phosphatase 39 - 117 U/L 63  Albumin 3.5 - 5.2 g/dL 4.4  AST 0 - 37 U/L 17  ALT 0 - 53  U/L 18  Total Protein 6.0 - 8.3 g/dL 7.1  Total Bilirubin 0.2 - 1.2 mg/dL 0.5  GFR >39.99 mL/min 58.03 (L)    Latest Reference Range & Units 05/13/23 10:40  Total CHOL/HDL Ratio  2  Cholesterol 0 - 200 mg/dL 93  HDL Cholesterol >60.99 mg/dL 46.99  LDL (calc) 0 - 99 mg/dL 29  NonHDL  60.04  Triglycerides 0.0 - 149.0 mg/dL 43.9  VLDL 0.0 - 59.9 mg/dL 88.7     In office  Bg 142 mg/dL    ASSESSMENT / PLAN / RECOMMENDATIONS:   1) Type 2 Diabetes Mellitus, Poorly controlled, With macrovascular complications - Most recent A1c of 8.1%. Goal A1c < 7.0 %.    - A1c has trended down from 9.3% to 8.1% -Patient with social determinants, has a high co-pay and would like to space his visits out to every 6 months -He did have technical difficulty with using the Dexcom -I will increase Mounjaro  and decrease Lantus  as below and will increase glipizide  as below - I did explain to the patient that his A1c needs to be < 7.5% prior to his inguinal hernia to reduce risk of infection and complications   MEDICATIONS: Increase glipizide  10 mg XL, 1 tablet daily Decrease Lantus  45 units daily  Continue Farxiga  10 mg daily  Increase mounjaro  12.5 mg weekly   EDUCATION / INSTRUCTIONS: BG monitoring instructions: Patient is instructed to check his blood sugars 1 times a day, fasting . Call [facility] if: BG persistently < 70  I reviewed the Rule of 15 for the treatment of hypoglycemia in detail with the patient. Literature supplied.    2) Diabetic complications:  Eye: Does not have known diabetic retinopathy.  Neuro/ Feet: Does not have known diabetic peripheral neuropathy .  Renal: Patient  does not have known baseline CKD. He   is  on an ACEI/ARB at present.      Patient would like to return for follow-up in 6 months due to a high co-pay, patient may contact our office to be seen sooner with hyperglycemia   Signed electronically by: Stefano Redgie Butts, MD  [facility] Endocrinology  [facility] [address] [place] [place], [place] [address] Phone: [phone] FAX: [phone]   CC: Berneta Elsie Sayre, MD [address] [place] [address] Phone: [phone]  Fax: [phone]  Return to Endocrinology clinic as below: Future Appointments  Date Time Provider Department Center  07/08/2023  7:30 AM Uriah Philipson, Donell Redgie, MD [facility] None  09/03/2023  7:15 AM [facility] H[facility] DEVICE REMOTES [facility]  11/14/2023  8:40 AM Berneta Elsie Sayre, MD [facility]  12/03/2023  7:15 AM [facility] DEVICE REMOTES [facility]&V  03/03/2024  7:15 AM [facility] DEVICE REMOTES [facility]&V  06/02/2024  7:15 AM [facility] DEVICE REMOTES [facility]&V  09/01/2024  7:15 AM [facility]VT DEVICE REMOTES [facility]

## 2023-07-08 NOTE — Patient Instructions (Signed)
 Increase Mounjaro  12.5 mg once a WEEK Increase Glipizide  10 mg XL 1 tablet before Supper  Continue Farxiga  10 mg, 1 tablet daily  Decrease  Lantus   45 units once  daily      HOW TO TREAT LOW BLOOD SUGARS (Blood sugar LESS THAN 70 MG/DL) Please follow the RULE OF 15 for the treatment of hypoglycemia treatment (when your (blood sugars are less than 70 mg/dL)   STEP 1: Take 15 grams of carbohydrates when your blood sugar is low, which includes:  3-4 GLUCOSE TABS  OR 3-4 OZ OF JUICE OR REGULAR SODA OR ONE TUBE OF GLUCOSE GEL    STEP 2: RECHECK blood sugar in 15 MINUTES STEP 3: If your blood sugar is still low at the 15 minute recheck --> then, go back to STEP 1 and treat AGAIN with another 15 grams of carbohydrates.

## 2023-07-17 ENCOUNTER — Other Ambulatory Visit: Payer: Self-pay | Admitting: Cardiology

## 2023-07-17 DIAGNOSIS — Z8673 Personal history of transient ischemic attack (TIA), and cerebral infarction without residual deficits: Secondary | ICD-10-CM

## 2023-07-17 NOTE — Telephone Encounter (Signed)
 Prescription refill request for Eliquis  received. Indication: CVA Last office visit: 08/22/22 Ema)  Scr: 1.32 (05/13/23)  Age: 63 Weight: 76.4kg  Appropriate dose. Refill sent.

## 2023-07-30 NOTE — Progress Notes (Signed)
 Remote ICD transmission.

## 2023-09-03 ENCOUNTER — Ambulatory Visit (INDEPENDENT_AMBULATORY_CARE_PROVIDER_SITE_OTHER): Payer: Managed Care, Other (non HMO)

## 2023-09-03 DIAGNOSIS — I255 Ischemic cardiomyopathy: Secondary | ICD-10-CM

## 2023-09-05 ENCOUNTER — Ambulatory Visit: Payer: Self-pay | Admitting: Cardiology

## 2023-09-05 LAB — CUP PACEART REMOTE DEVICE CHECK
Battery Remaining Longevity: 162 mo
Battery Remaining Percentage: 100 %
Brady Statistic RV Percent Paced: 0 %
Date Time Interrogation Session: 20250902041100
HighPow Impedance: 77 Ohm
Implantable Lead Connection Status: 753985
Implantable Lead Implant Date: 20230602
Implantable Lead Location: 753860
Implantable Lead Model: 138
Implantable Lead Serial Number: 303305
Implantable Pulse Generator Implant Date: 20230602
Lead Channel Impedance Value: 595 Ohm
Lead Channel Pacing Threshold Amplitude: 0.5 V
Lead Channel Pacing Threshold Pulse Width: 0.4 ms
Lead Channel Setting Pacing Amplitude: 2.5 V
Lead Channel Setting Pacing Pulse Width: 0.4 ms
Lead Channel Setting Sensing Sensitivity: 0.5 mV
Pulse Gen Serial Number: 217470

## 2023-09-11 NOTE — Progress Notes (Signed)
Remote ICD Transmission.

## 2023-09-27 ENCOUNTER — Inpatient Hospital Stay (HOSPITAL_COMMUNITY)

## 2023-09-27 ENCOUNTER — Encounter (HOSPITAL_COMMUNITY): Payer: Self-pay | Admitting: Internal Medicine

## 2023-09-27 ENCOUNTER — Inpatient Hospital Stay (HOSPITAL_COMMUNITY)
Admission: EM | Admit: 2023-09-27 | Discharge: 2023-10-04 | DRG: 481 | Disposition: A | Discharge status: Rehabilitation Facility | Payer: Worker's Compensation | Attending: Internal Medicine | Admitting: Internal Medicine

## 2023-09-27 ENCOUNTER — Emergency Department (HOSPITAL_COMMUNITY): Payer: Worker's Compensation

## 2023-09-27 ENCOUNTER — Encounter (HOSPITAL_COMMUNITY)
Admission: EM | Disposition: A | Discharge status: Rehabilitation Facility | Payer: Self-pay | Source: Home / Self Care | Attending: Internal Medicine

## 2023-09-27 ENCOUNTER — Other Ambulatory Visit: Payer: Self-pay

## 2023-09-27 ENCOUNTER — Inpatient Hospital Stay (HOSPITAL_COMMUNITY): Payer: Worker's Compensation | Admitting: Certified Registered Nurse Anesthetist

## 2023-09-27 DIAGNOSIS — Z7985 Long-term (current) use of injectable non-insulin antidiabetic drugs: Secondary | ICD-10-CM

## 2023-09-27 DIAGNOSIS — G4700 Insomnia, unspecified: Secondary | ICD-10-CM | POA: Diagnosis present

## 2023-09-27 DIAGNOSIS — I255 Ischemic cardiomyopathy: Secondary | ICD-10-CM | POA: Diagnosis present

## 2023-09-27 DIAGNOSIS — S72144D Nondisplaced intertrochanteric fracture of right femur, subsequent encounter for closed fracture with routine healing: Secondary | ICD-10-CM | POA: Diagnosis not present

## 2023-09-27 DIAGNOSIS — Z7982 Long term (current) use of aspirin: Secondary | ICD-10-CM | POA: Diagnosis not present

## 2023-09-27 DIAGNOSIS — Z8249 Family history of ischemic heart disease and other diseases of the circulatory system: Secondary | ICD-10-CM | POA: Diagnosis not present

## 2023-09-27 DIAGNOSIS — Z7984 Long term (current) use of oral hypoglycemic drugs: Secondary | ICD-10-CM

## 2023-09-27 DIAGNOSIS — Z7901 Long term (current) use of anticoagulants: Secondary | ICD-10-CM

## 2023-09-27 DIAGNOSIS — Z833 Family history of diabetes mellitus: Secondary | ICD-10-CM

## 2023-09-27 DIAGNOSIS — K5901 Slow transit constipation: Secondary | ICD-10-CM | POA: Diagnosis not present

## 2023-09-27 DIAGNOSIS — I2510 Atherosclerotic heart disease of native coronary artery without angina pectoris: Secondary | ICD-10-CM

## 2023-09-27 DIAGNOSIS — I130 Hypertensive heart and chronic kidney disease with heart failure and stage 1 through stage 4 chronic kidney disease, or unspecified chronic kidney disease: Secondary | ICD-10-CM | POA: Diagnosis present

## 2023-09-27 DIAGNOSIS — Z794 Long term (current) use of insulin: Secondary | ICD-10-CM | POA: Diagnosis not present

## 2023-09-27 DIAGNOSIS — E1165 Type 2 diabetes mellitus with hyperglycemia: Secondary | ICD-10-CM | POA: Diagnosis present

## 2023-09-27 DIAGNOSIS — L7632 Postprocedural hematoma of skin and subcutaneous tissue following other procedure: Secondary | ICD-10-CM | POA: Diagnosis not present

## 2023-09-27 DIAGNOSIS — Y990 Civilian activity done for income or pay: Secondary | ICD-10-CM | POA: Diagnosis not present

## 2023-09-27 DIAGNOSIS — I5022 Chronic systolic (congestive) heart failure: Secondary | ICD-10-CM

## 2023-09-27 DIAGNOSIS — Z8673 Personal history of transient ischemic attack (TIA), and cerebral infarction without residual deficits: Secondary | ICD-10-CM

## 2023-09-27 DIAGNOSIS — Z825 Family history of asthma and other chronic lower respiratory diseases: Secondary | ICD-10-CM

## 2023-09-27 DIAGNOSIS — Z95810 Presence of automatic (implantable) cardiac defibrillator: Secondary | ICD-10-CM

## 2023-09-27 DIAGNOSIS — S72001A Fracture of unspecified part of neck of right femur, initial encounter for closed fracture: Secondary | ICD-10-CM

## 2023-09-27 DIAGNOSIS — S72009A Fracture of unspecified part of neck of unspecified femur, initial encounter for closed fracture: Secondary | ICD-10-CM | POA: Diagnosis present

## 2023-09-27 DIAGNOSIS — E119 Type 2 diabetes mellitus without complications: Secondary | ICD-10-CM | POA: Diagnosis not present

## 2023-09-27 DIAGNOSIS — Z87891 Personal history of nicotine dependence: Secondary | ICD-10-CM

## 2023-09-27 DIAGNOSIS — W19XXXA Unspecified fall, initial encounter: Secondary | ICD-10-CM | POA: Diagnosis present

## 2023-09-27 DIAGNOSIS — I10 Essential (primary) hypertension: Secondary | ICD-10-CM | POA: Diagnosis not present

## 2023-09-27 DIAGNOSIS — I110 Hypertensive heart disease with heart failure: Secondary | ICD-10-CM

## 2023-09-27 DIAGNOSIS — R739 Hyperglycemia, unspecified: Secondary | ICD-10-CM | POA: Diagnosis not present

## 2023-09-27 DIAGNOSIS — S72001D Fracture of unspecified part of neck of right femur, subsequent encounter for closed fracture with routine healing: Secondary | ICD-10-CM | POA: Diagnosis not present

## 2023-09-27 DIAGNOSIS — R5381 Other malaise: Secondary | ICD-10-CM | POA: Diagnosis not present

## 2023-09-27 DIAGNOSIS — E785 Hyperlipidemia, unspecified: Secondary | ICD-10-CM | POA: Diagnosis present

## 2023-09-27 DIAGNOSIS — N1831 Chronic kidney disease, stage 3a: Secondary | ICD-10-CM | POA: Diagnosis present

## 2023-09-27 DIAGNOSIS — S72141A Displaced intertrochanteric fracture of right femur, initial encounter for closed fracture: Secondary | ICD-10-CM | POA: Diagnosis present

## 2023-09-27 DIAGNOSIS — E1122 Type 2 diabetes mellitus with diabetic chronic kidney disease: Secondary | ICD-10-CM | POA: Diagnosis present

## 2023-09-27 DIAGNOSIS — D62 Acute posthemorrhagic anemia: Secondary | ICD-10-CM | POA: Diagnosis not present

## 2023-09-27 DIAGNOSIS — S72141D Displaced intertrochanteric fracture of right femur, subsequent encounter for closed fracture with routine healing: Secondary | ICD-10-CM | POA: Diagnosis not present

## 2023-09-27 HISTORY — DX: Presence of automatic (implantable) cardiac defibrillator: Z95.810

## 2023-09-27 HISTORY — PX: INTRAMEDULLARY (IM) NAIL INTERTROCHANTERIC: SHX5875

## 2023-09-27 LAB — GLUCOSE, CAPILLARY
Glucose-Capillary: 92 mg/dL (ref 70–99)
Glucose-Capillary: 131 mg/dL — ABNORMAL HIGH (ref 70–99)
Glucose-Capillary: 155 mg/dL — ABNORMAL HIGH (ref 70–99)

## 2023-09-27 LAB — CBC WITH DIFFERENTIAL/PLATELET
Abs Immature Granulocytes: 0.1 K/uL — ABNORMAL HIGH (ref 0.00–0.07)
Basophils Absolute: 0.1 K/uL (ref 0.0–0.1)
Basophils Relative: 1 %
Eosinophils Absolute: 0.2 K/uL (ref 0.0–0.5)
Eosinophils Relative: 1 %
HCT: 46.4 % (ref 39.0–52.0)
Hemoglobin: 15.6 g/dL (ref 13.0–17.0)
Immature Granulocytes: 1 %
Lymphocytes Relative: 12 %
Lymphs Abs: 1.6 K/uL (ref 0.7–4.0)
MCH: 31.7 pg (ref 26.0–34.0)
MCHC: 33.6 g/dL (ref 30.0–36.0)
MCV: 94.3 fL (ref 80.0–100.0)
Monocytes Absolute: 0.8 K/uL (ref 0.1–1.0)
Monocytes Relative: 6 %
Neutro Abs: 11.3 K/uL — ABNORMAL HIGH (ref 1.7–7.7)
Neutrophils Relative %: 79 %
Platelets: 336 K/uL (ref 150–400)
RBC: 4.92 MIL/uL (ref 4.22–5.81)
RDW: 12.7 % (ref 11.5–15.5)
WBC: 14.1 K/uL — ABNORMAL HIGH (ref 4.0–10.5)
nRBC: 0 % (ref 0.0–0.2)

## 2023-09-27 LAB — BASIC METABOLIC PANEL WITH GFR
Anion gap: 13 (ref 5–15)
BUN: 27 mg/dL — ABNORMAL HIGH (ref 8–23)
CO2: 19 mmol/L — ABNORMAL LOW (ref 22–32)
Calcium: 8.8 mg/dL — ABNORMAL LOW (ref 8.9–10.3)
Chloride: 105 mmol/L (ref 98–111)
Creatinine, Ser: 1.41 mg/dL — ABNORMAL HIGH (ref 0.61–1.24)
GFR, Estimated: 56 mL/min — ABNORMAL LOW (ref >60)
Glucose, Bld: 165 mg/dL — ABNORMAL HIGH (ref 70–99)
Potassium: 4.5 mmol/L (ref 3.5–5.1)
Sodium: 137 mmol/L (ref 135–145)

## 2023-09-27 LAB — TYPE AND SCREEN
ABO/RH(D): A POS
Antibody Screen: NEGATIVE

## 2023-09-27 LAB — ABO/RH: ABO/RH(D): A POS

## 2023-09-27 LAB — CBG MONITORING, ED: Glucose-Capillary: 97 mg/dL (ref 70–99)

## 2023-09-27 LAB — SURGICAL PCR SCREEN
MRSA, PCR: NEGATIVE
Staphylococcus aureus: POSITIVE — AB

## 2023-09-27 LAB — HIV ANTIBODY (ROUTINE TESTING W REFLEX): HIV Screen 4th Generation wRfx: NONREACTIVE

## 2023-09-27 LAB — PROTIME-INR
INR: 1.1 (ref 0.8–1.2)
Prothrombin Time: 14.9 s (ref 11.4–15.2)

## 2023-09-27 SURGERY — FIXATION, FRACTURE, INTERTROCHANTERIC, WITH INTRAMEDULLARY ROD
Anesthesia: General | Laterality: Right

## 2023-09-27 MED ORDER — ORAL CARE MOUTH RINSE: 15.0000 mL | Freq: Once | OROMUCOSAL | Status: AC

## 2023-09-27 MED ORDER — CHLORHEXIDINE GLUCONATE 0.12 % MT SOLN
OROMUCOSAL | Status: AC
  Administered 2023-09-27: 15 mL via OROMUCOSAL
  Filled 2023-09-27: qty 15

## 2023-09-27 MED ORDER — ONDANSETRON HCL 4 MG PO TABS: 4.0000 mg | ORAL_TABLET | Freq: Four times a day (QID) | ORAL | Status: DC | PRN

## 2023-09-27 MED ORDER — MORPHINE SULFATE (PF) 2 MG/ML IV SOLN
0.5000 mg | INTRAVENOUS | Status: DC | PRN
  Administered 2023-09-27 – 2023-10-04 (×13): 0.5 mg via INTRAVENOUS
  Filled 2023-09-27 (×13): qty 1

## 2023-09-27 MED ORDER — CHLORHEXIDINE GLUCONATE 0.12 % MT SOLN: 15.0000 mL | Freq: Once | OROMUCOSAL | Status: AC

## 2023-09-27 MED ORDER — BISACODYL 5 MG PO TBEC: 5.0000 mg | DELAYED_RELEASE_TABLET | Freq: Every day | ORAL | Status: DC | PRN

## 2023-09-27 MED ORDER — OXYCODONE HCL 5 MG/5ML PO SOLN: 5.0000 mg | Freq: Once | ORAL | Status: DC | PRN

## 2023-09-27 MED ORDER — HYDROMORPHONE HCL 1 MG/ML IJ SOLN: 0.2500 mg | INTRAMUSCULAR | Status: DC | PRN

## 2023-09-27 MED ORDER — CHLORHEXIDINE GLUCONATE 4 % EX SOLN: 1.0000 | CUTANEOUS | 1 refills | Status: DC

## 2023-09-27 MED ORDER — INSULIN ASPART 100 UNIT/ML IJ SOLN
0.0000 [IU] | Freq: Every day | INTRAMUSCULAR | Status: DC
  Administered 2023-10-03: 2 [IU] via SUBCUTANEOUS

## 2023-09-27 MED ORDER — ONDANSETRON HCL 4 MG/2ML IJ SOLN
4.0000 mg | Freq: Once | INTRAMUSCULAR | Status: AC
  Administered 2023-09-27: 4 mg via INTRAVENOUS
  Filled 2023-09-27: qty 2

## 2023-09-27 MED ORDER — LACTATED RINGERS IV SOLN: INTRAVENOUS | Status: DC

## 2023-09-27 MED ORDER — PROPOFOL 10 MG/ML IV BOLUS
INTRAVENOUS | Status: AC
  Filled 2023-09-27: qty 20

## 2023-09-27 MED ORDER — FENTANYL CITRATE (PF) 250 MCG/5ML IJ SOLN
INTRAMUSCULAR | Status: AC
  Filled 2023-09-27: qty 5

## 2023-09-27 MED ORDER — PHENYLEPHRINE 80 MCG/ML (10ML) SYRINGE FOR IV PUSH (FOR BLOOD PRESSURE SUPPORT)
PREFILLED_SYRINGE | INTRAVENOUS | Status: DC | PRN
  Administered 2023-09-27 (×2): 160 ug via INTRAVENOUS

## 2023-09-27 MED ORDER — OXYCODONE HCL 5 MG PO TABS: 5.0000 mg | ORAL_TABLET | Freq: Once | ORAL | Status: DC | PRN

## 2023-09-27 MED ORDER — FENTANYL CITRATE PF 50 MCG/ML IJ SOSY
50.0000 ug | PREFILLED_SYRINGE | INTRAMUSCULAR | Status: DC | PRN
  Administered 2023-09-27: 50 ug via INTRAVENOUS
  Filled 2023-09-27: qty 1

## 2023-09-27 MED ORDER — LIDOCAINE 2% (20 MG/ML) 5 ML SYRINGE
INTRAMUSCULAR | Status: DC | PRN
  Administered 2023-09-27: 60 mg via INTRAVENOUS

## 2023-09-27 MED ORDER — HYDROCODONE-ACETAMINOPHEN 5-325 MG PO TABS
1.0000 | ORAL_TABLET | Freq: Four times a day (QID) | ORAL | Status: DC | PRN
  Administered 2023-09-27 – 2023-10-03 (×19): 2 via ORAL
  Administered 2023-10-03: 1 via ORAL
  Administered 2023-10-04: 2 via ORAL
  Filled 2023-09-27 (×7): qty 2
  Filled 2023-09-27: qty 1
  Filled 2023-09-27 (×16): qty 2

## 2023-09-27 MED ORDER — SUGAMMADEX SODIUM 200 MG/2ML IV SOLN
INTRAVENOUS | Status: DC | PRN
  Administered 2023-09-27: 200 mg via INTRAVENOUS

## 2023-09-27 MED ORDER — INSULIN GLARGINE-YFGN 100 UNIT/ML ~~LOC~~ SOLN: 20.0000 [IU] | Freq: Every day | SUBCUTANEOUS | Status: DC

## 2023-09-27 MED ORDER — INSULIN GLARGINE 100 UNIT/ML ~~LOC~~ SOLN
20.0000 [IU] | Freq: Every day | SUBCUTANEOUS | Status: DC
  Administered 2023-09-27 – 2023-10-04 (×8): 20 [IU] via SUBCUTANEOUS
  Filled 2023-09-27 (×8): qty 0.2

## 2023-09-27 MED ORDER — ZOLPIDEM TARTRATE 5 MG PO TABS
5.0000 mg | ORAL_TABLET | Freq: Every evening | ORAL | Status: DC | PRN
  Administered 2023-09-27 – 2023-10-01 (×5): 5 mg via ORAL
  Filled 2023-09-27 (×5): qty 1

## 2023-09-27 MED ORDER — ONDANSETRON HCL 4 MG/2ML IJ SOLN: 4.0000 mg | Freq: Four times a day (QID) | INTRAMUSCULAR | Status: DC | PRN

## 2023-09-27 MED ORDER — DEXAMETHASONE SODIUM PHOSPHATE 10 MG/ML IJ SOLN
INTRAMUSCULAR | Status: DC | PRN
  Administered 2023-09-27: 5 mg via INTRAVENOUS

## 2023-09-27 MED ORDER — ONDANSETRON HCL 4 MG/2ML IJ SOLN
INTRAMUSCULAR | Status: AC
  Filled 2023-09-27: qty 2

## 2023-09-27 MED ORDER — FENTANYL CITRATE PF 50 MCG/ML IJ SOSY
50.0000 ug | PREFILLED_SYRINGE | Freq: Once | INTRAMUSCULAR | Status: AC
  Administered 2023-09-27: 50 ug via INTRAVENOUS
  Filled 2023-09-27: qty 1

## 2023-09-27 MED ORDER — SPIRONOLACTONE 12.5 MG HALF TABLET
12.5000 mg | ORAL_TABLET | Freq: Every day | ORAL | Status: DC
  Administered 2023-09-27 – 2023-10-04 (×8): 12.5 mg via ORAL
  Filled 2023-09-27 (×8): qty 1

## 2023-09-27 MED ORDER — EZETIMIBE 10 MG PO TABS
10.0000 mg | ORAL_TABLET | Freq: Every day | ORAL | Status: DC
  Administered 2023-09-27 – 2023-10-04 (×8): 10 mg via ORAL
  Filled 2023-09-27 (×8): qty 1

## 2023-09-27 MED ORDER — NOREPINEPHRINE 4 MG/250ML-% IV SOLN
INTRAVENOUS | Status: DC | PRN
  Administered 2023-09-27: 2 ug/min via INTRAVENOUS

## 2023-09-27 MED ORDER — DAPAGLIFLOZIN PROPANEDIOL 10 MG PO TABS
10.0000 mg | ORAL_TABLET | Freq: Every day | ORAL | Status: DC
  Administered 2023-09-27 – 2023-10-04 (×8): 10 mg via ORAL
  Filled 2023-09-27 (×8): qty 1

## 2023-09-27 MED ORDER — PROPOFOL 10 MG/ML IV BOLUS
INTRAVENOUS | Status: DC | PRN
  Administered 2023-09-27: 120 mg via INTRAVENOUS

## 2023-09-27 MED ORDER — POLYETHYLENE GLYCOL 3350 17 G PO PACK
17.0000 g | PACK | Freq: Every day | ORAL | Status: DC | PRN
  Administered 2023-09-29: 17 g via ORAL
  Filled 2023-09-27: qty 1

## 2023-09-27 MED ORDER — MUPIROCIN 2 % EX OINT: 1.0000 | TOPICAL_OINTMENT | Freq: Two times a day (BID) | CUTANEOUS | 0 refills | Status: AC

## 2023-09-27 MED ORDER — POVIDONE-IODINE 10 % EX SWAB
2.0000 | Freq: Once | CUTANEOUS | Status: AC
  Administered 2023-09-27: 2 via TOPICAL

## 2023-09-27 MED ORDER — FENTANYL CITRATE (PF) 250 MCG/5ML IJ SOLN
INTRAMUSCULAR | Status: DC | PRN
  Administered 2023-09-27 (×5): 50 ug via INTRAVENOUS

## 2023-09-27 MED ORDER — ONDANSETRON HCL 4 MG/2ML IJ SOLN
INTRAMUSCULAR | Status: DC | PRN
  Administered 2023-09-27: 4 mg via INTRAVENOUS

## 2023-09-27 MED ORDER — ROCURONIUM BROMIDE 10 MG/ML (PF) SYRINGE
PREFILLED_SYRINGE | INTRAVENOUS | Status: DC | PRN
  Administered 2023-09-27: 60 mg via INTRAVENOUS

## 2023-09-27 MED ORDER — CEFAZOLIN SODIUM-DEXTROSE 2-4 GM/100ML-% IV SOLN
2.0000 g | Freq: Four times a day (QID) | INTRAVENOUS | Status: AC
  Administered 2023-09-27 – 2023-09-28 (×3): 2 g via INTRAVENOUS
  Filled 2023-09-27 (×3): qty 100

## 2023-09-27 MED ORDER — DOCUSATE SODIUM 100 MG PO CAPS
100.0000 mg | ORAL_CAPSULE | Freq: Two times a day (BID) | ORAL | Status: DC
  Administered 2023-09-27 – 2023-09-29 (×5): 100 mg via ORAL
  Filled 2023-09-27 (×5): qty 1

## 2023-09-27 MED ORDER — CARVEDILOL 6.25 MG PO TABS
6.2500 mg | ORAL_TABLET | Freq: Two times a day (BID) | ORAL | Status: DC
Start: 2023-09-28 — End: 2023-10-01
  Administered 2023-09-28 – 2023-09-30 (×6): 6.25 mg via ORAL
  Filled 2023-09-27 (×6): qty 1

## 2023-09-27 MED ORDER — CEFAZOLIN SODIUM-DEXTROSE 2-4 GM/100ML-% IV SOLN
2.0000 g | INTRAVENOUS | Status: AC
  Administered 2023-09-27: 2 g via INTRAVENOUS
  Filled 2023-09-27: qty 100

## 2023-09-27 MED ORDER — ONDANSETRON HCL 4 MG/2ML IJ SOLN: 4.0000 mg | Freq: Once | INTRAMUSCULAR | Status: DC | PRN

## 2023-09-27 MED ORDER — INSULIN ASPART 100 UNIT/ML IJ SOLN
0.0000 [IU] | Freq: Three times a day (TID) | INTRAMUSCULAR | Status: DC
  Administered 2023-09-28 (×2): 2 [IU] via SUBCUTANEOUS
  Administered 2023-09-28: 7 [IU] via SUBCUTANEOUS
  Administered 2023-09-29: 3 [IU] via SUBCUTANEOUS
  Administered 2023-09-29: 2 [IU] via SUBCUTANEOUS
  Administered 2023-09-30: 1 [IU] via SUBCUTANEOUS
  Administered 2023-10-01: 2 [IU] via SUBCUTANEOUS
  Administered 2023-10-01: 5 [IU] via SUBCUTANEOUS
  Administered 2023-10-02: 2 [IU] via SUBCUTANEOUS
  Administered 2023-10-02: 3 [IU] via SUBCUTANEOUS
  Administered 2023-10-02: 5 [IU] via SUBCUTANEOUS
  Administered 2023-10-03: 2 [IU] via SUBCUTANEOUS
  Administered 2023-10-03: 1 [IU] via SUBCUTANEOUS
  Administered 2023-10-03: 3 [IU] via SUBCUTANEOUS
  Administered 2023-10-04 (×2): 1 [IU] via SUBCUTANEOUS

## 2023-09-27 MED ORDER — DEXAMETHASONE SODIUM PHOSPHATE 10 MG/ML IJ SOLN
INTRAMUSCULAR | Status: AC
  Filled 2023-09-27: qty 1

## 2023-09-27 MED ORDER — NOREPINEPHRINE BITARTRATE 1 MG/ML IV SOLN
INTRAVENOUS | Status: DC | PRN
  Administered 2023-09-27 (×2): 1 mL via INTRAVENOUS

## 2023-09-27 MED ORDER — CHLORHEXIDINE GLUCONATE 4 % EX SOLN
60.0000 mL | Freq: Once | CUTANEOUS | Status: DC
  Filled 2023-09-27: qty 60

## 2023-09-27 MED ORDER — 0.9 % SODIUM CHLORIDE (POUR BTL) OPTIME
TOPICAL | Status: DC | PRN
  Administered 2023-09-27: 1000 mL

## 2023-09-27 MED ORDER — CARVEDILOL 12.5 MG PO TABS
12.5000 mg | ORAL_TABLET | Freq: Once | ORAL | Status: AC
  Administered 2023-09-27: 12.5 mg via ORAL
  Filled 2023-09-27: qty 1

## 2023-09-27 SURGICAL SUPPLY — 35 items
ALCOHOL 70% 16 OZ (MISCELLANEOUS) ×1 IMPLANT
BAG COUNTER SPONGE SURGICOUNT (BAG) ×1 IMPLANT
BIT DRILL CALIBRATED 4.2 (BIT) IMPLANT
BIT DRILL CANN 16 HIP (BIT) IMPLANT
BIT DRILL CANN STP 6/9 HIP (BIT) IMPLANT
BIT DRILL TAPERED 10 (BIT) IMPLANT
BLADE SURG 10 STRL SS (BLADE) ×1 IMPLANT
BNDG COHESIVE 4X5 TAN STRL LF (GAUZE/BANDAGES/DRESSINGS) ×1 IMPLANT
BNDG COHESIVE 6X5 TAN ST LF (GAUZE/BANDAGES/DRESSINGS) ×1 IMPLANT
CHLORAPREP W/TINT 26 (MISCELLANEOUS) ×1 IMPLANT
COVER BACK TABLE 80X110 HD (DRAPES) ×1 IMPLANT
COVER PERINEAL POST (MISCELLANEOUS) ×1 IMPLANT
COVER SURGICAL LIGHT HANDLE (MISCELLANEOUS) ×1 IMPLANT
DRAPE STERI IOBAN 125X83 (DRAPES) ×1 IMPLANT
DRESSING MEPILEX FLEX 4X4 (GAUZE/BANDAGES/DRESSINGS) ×3 IMPLANT
DRSG MEPITEL 4X7.2 (GAUZE/BANDAGES/DRESSINGS) IMPLANT
GLOVE SURG SYN 8.0 PF PI (GLOVE) ×2 IMPLANT
GOWN STRL REUS W/TWL LRG LVL3 (GOWN DISPOSABLE) ×1 IMPLANT
GOWN STRL REUS W/TWL XL LVL3 (GOWN DISPOSABLE) ×1 IMPLANT
GUIDEWIRE 3.2X400 (WIRE) IMPLANT
KIT BASIN OR (CUSTOM PROCEDURE TRAY) ×1 IMPLANT
KIT TURNOVER KIT B (KITS) ×1 IMPLANT
NAIL CANN TFNA DEG 11X170-125 (Nail) IMPLANT
PAD ARMBOARD POSITIONER FOAM (MISCELLANEOUS) ×3 IMPLANT
SCREW LOCK IM NAIL 5.0X34 (Screw) IMPLANT
SCREW TFNA HELICAL 105 (Screw) IMPLANT
SOLN 0.9% NACL 1000 ML (IV SOLUTION) ×1 IMPLANT
SOLN 0.9% NACL POUR BTL 1000ML (IV SOLUTION) ×1 IMPLANT
SOLN STERILE WATER 1000 ML (IV SOLUTION) ×1 IMPLANT
SOLN STERILE WATER BTL 1000 ML (IV SOLUTION) ×1 IMPLANT
SPONGE T-LAP 18X18 ~~LOC~~+RFID (SPONGE) ×1 IMPLANT
STAPLER SKIN PROX 35W (STAPLE) ×1 IMPLANT
SUT VIC AB 1 CTX36XBRD ANBCTRL (SUTURE) ×1 IMPLANT
SUT VIC AB 2-0 SH 27X BRD (SUTURE) ×1 IMPLANT
TOWEL GREEN STERILE (TOWEL DISPOSABLE) ×1 IMPLANT

## 2023-09-27 NOTE — ED Provider Notes (Signed)
 [place] EMERGENCY DEPARTMENT AT [facility] Provider Note   CSN: [id number] Arrival date & time: 09/27/23  0540     Patient presents with: John [name] is a 63 y.o. male.   The history is provided by the patient.  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  Patient works third shift at [employer] on [address].  He reports he was working with a pallet when he lost his balance and fell backwards.  He may have struck his head, but denies any LOC.  No headache or neck pain.  He reports most of the pain is in his right hip He denies any known lacerations to his scalp  He denies any chest pain or shortness of breath.  Was lysed at his baseline prior to going to work Past Medical History:  Diagnosis Date   Arthritis    Diabetes mellitus without complication (HCC)    Hyperlipidemia    Hypertension    Stroke (HCC) 05/2020    Prior to Admission medications   Medication Sig Start Date End Date Taking? Authorizing Provider  acetaminophen  (TYLENOL ) 500 MG tablet Take 500 mg by mouth every 6 (six) hours as needed for moderate pain or headache.    [provider]  apixaban  (ELIQUIS ) 5 MG TABS tablet TAKE 1 TABLET BY MOUTH 2 TIMES A DAY 07/17/23   Pietro Redell RAMAN, MD  carvedilol  (COREG ) 12.5 MG tablet TAKE ONE TABLET BY MOUTH TWICE A DAY WITH A MEAL 07/23/22   Pietro Redell RAMAN, MD  dapagliflozin  propanediol (FARXIGA ) 10 MG TABS tablet Take 1 tablet (10 mg total) by mouth daily. 02/28/23   Shamleffer, Ibtehal Jaralla, MD  diclofenac  Sodium (VOLTAREN ) 1 % GEL Rub a small grape sized dollop into knee up to 3 times daily as needed. 09/08/20   Berneta Elsie Sayre, MD  Evolocumab  (REPATHA  SURECLICK) 140 MG/ML SOAJ INJECT 1 MILLILITER UNDER THE SKIN EVERY 14 DAYS 12/14/22   Pietro Redell RAMAN, MD  ezetimibe  (ZETIA ) 10 MG tablet Take 1 tablet (10 mg total) by mouth daily. 11/13/22   Berneta Elsie Sayre, MD  glipiZIDE  (GLUCOTROL  XL) 10 MG 24 hr tablet Take 1 tablet (10 mg total) by mouth daily with breakfast. 07/08/23   Shamleffer, Ibtehal Jaralla, MD  glucose blood test strip 1 each by Other route 2 (two) times daily. And lancets 2/day 03/10/18   Kassie Mallick, MD  insulin  glargine (LANTUS  SOLOSTAR) 100 UNIT/ML Solostar Pen Inject 45 Units into the skin every morning. And pen needles 1/day 07/08/23   Shamleffer, Ibtehal Jaralla, MD  Insulin  Pen Needle (PEN NEEDLES) 32G X 4 MM MISC 1 Package by Does not apply route daily in the afternoon. 02/28/23   Shamleffer, Ibtehal Jaralla, MD  sacubitril-valsartan (ENTRESTO ) 24-26 MG TAKE 1 TABLET BY MOUTH TWICE A DAY 02/12/23   Pietro Redell RAMAN, MD  sildenafil  (VIAGRA ) 100 MG tablet Take 0.5-1 tablets (50-100 mg total) by mouth daily as needed for erectile dysfunction. 11/13/22   Berneta Elsie Sayre, MD  spironolactone  (ALDACTONE ) 25 MG tablet TAKE 1/2 TABLET BY MOUTH DAILY 08/08/22   Fernande Elspeth BROCKS, MD  tirzepatide  (MOUNJARO ) 12.5 MG/0.5ML Pen Inject 12.5 mg into the skin once a week. 07/08/23   Shamleffer, Ibtehal Jaralla, MD    Allergies: Patient has no known allergies.    Review of Systems  Respiratory:  Negative for shortness of breath.   Cardiovascular:  Negative  for chest pain.  Musculoskeletal:  Positive for arthralgias.    Updated Vital Signs BP 108/82   Pulse 97   Temp (!) 97.4 F (36.3 C)   Resp 15   Ht 1.753 m (5' 9)   Wt 76 kg   SpO2 98%   BMI 24.74 kg/m   Physical Exam CONSTITUTIONAL: Well developed/well nourished, anxious HEAD: Normocephalic/atraumatic, no lacerations, no step-offs, no crepitus EYES: EOMI/PERRL ENMT: Mucous membranes moist, no facial trauma NECK: supple no meningeal signs SPINE/BACK:entire spine nontender No bruising/crepitance/stepoffs noted to spine CV: S1/S2 noted, no murmurs/rubs/gallops noted LUNGS: Lungs are clear to auscultation bilaterally, no apparent distress ABDOMEN: soft,  nontender NEURO: Pt is awake/alert/appropriate EXTREMITIES: pulses normal/equal, distal pulses equal intact in both feet Right lower extremity is shortened and externally rotated.  Tenderness to range of motion of right hip All other extremities/joints palpated/ranged and nontender No tenderness to right ankle or foot, or right knee SKIN: warm, color normal PSYCH: Anxious  (all labs ordered are listed, but only abnormal results are displayed) Labs Reviewed  BASIC METABOLIC PANEL WITH GFR - Abnormal; Notable for the following components:      Result Value   CO2 19 (*)    Glucose, Bld 165 (*)    BUN 27 (*)    Creatinine, Ser 1.41 (*)    Calcium  8.8 (*)    GFR, Estimated 56 (*)    All other components within normal limits  CBC WITH DIFFERENTIAL/PLATELET - Abnormal; Notable for the following components:   WBC 14.1 (*)    Neutro Abs 11.3 (*)    Abs Immature Granulocytes 0.10 (*)    All other components within normal limits  PROTIME-INR  TYPE AND SCREEN  ABO/RH    EKG: EKG Interpretation Date/Time:  Friday September 27 2023 06:31:35 EDT Ventricular Rate:  94 PR Interval:  169 QRS Duration:  95 QT Interval:  352 QTC Calculation: 441 R Axis:   29  Text Interpretation: Sinus rhythm Anterior infarct, old Nonspecific T abnormalities, lateral leads Confirmed by Midge Golas ([id number]) on 09/27/2023 6:51:03 AM  Radiology: DG Chest 1 View Result Date: 09/27/2023 EXAM: 1 VIEW(S) XRAY OF THE CHEST 09/27/2023 06:25:47 AM COMPARISON: AP radiograph of the chest dated 06/02/2021. CLINICAL HISTORY: pain FINDINGS: LINES, TUBES AND DEVICES: Left chest wall ICD in place. LUNGS AND PLEURA: Low lung volumes. No focal pulmonary opacity. No pulmonary edema. No pleural effusion. No pneumothorax. HEART AND MEDIASTINUM: No acute abnormality of the cardiac and mediastinal silhouettes. BONES AND SOFT TISSUES: Multilevel thoracic osteophytosis. IMPRESSION: 1. No acute cardiopulmonary abnormality. 2. Left  chest wall implantable cardioverter-defibrillator in place. Electronically signed by: Evalene Coho MD 09/27/2023 06:53 AM EDT RP Workstation: [id number]   CT HEAD WO CONTRAST Result Date: 09/27/2023 EXAM: CT HEAD WITHOUT CONTRAST 09/27/2023 06:44:20 AM TECHNIQUE: CT of the head was performed without the administration of intravenous contrast. Automated exposure control, iterative reconstruction, and/or weight based adjustment of the mA/kV was utilized to reduce the radiation dose to as low as reasonably achievable. COMPARISON: CT angiogram of the head dated 05/31/2020. CLINICAL HISTORY: Head trauma, moderate-severe. Pt BIB [facility] from work at [employer], after ground level fall. Pt was reaching above his head when he lost balance and fell backwards. Pt denies LOC, did hit head and has 2 cm Lac; bleeding controlled. Pt does take eliquis . FINDINGS: BRAIN AND VENTRICLES: No acute hemorrhage. No evidence of acute infarct. No hydrocephalus. No extra-axial collection. No mass effect or midline shift. ORBITS: No acute abnormality. SINUSES: No  acute abnormality. SOFT TISSUES AND SKULL: 2 cm Lac; bleeding controlled. No skull fracture. IMPRESSION: 1. No acute intracranial abnormality. Electronically signed by: Evalene Coho MD 09/27/2023 06:52 AM EDT RP Workstation: [id number]   DG Hip Unilat With Pelvis 2-3 Views Right Result Date: 09/27/2023 EXAM: 2 OR MORE VIEW(S) XRAY OF THE RIGHT HIP 09/27/2023 06:25:47 AM COMPARISON: None available. CLINICAL HISTORY: pain FINDINGS: BONES AND JOINTS: Acute comminuted intertrochanteric fracture of the right femur with medially displaced lesser trochanter by 1.2 cm. The hip joint is maintained. No significant degenerative changes. SOFT TISSUES: Vascular calcifications. IMPRESSION: 1. Acute comminuted intertrochanteric fracture of the right femur with medially displaced lesser trochanter by 1.2 cm. Electronically signed by: Waddell Calk MD 09/27/2023 06:51 AM EDT RP  Workstation: [id number]     Procedures   Medications Ordered in the ED  fentaNYL  (SUBLIMAZE ) injection 50 mcg (50 mcg Intravenous Given 09/27/23 0625)  ondansetron  (ZOFRAN ) injection 4 mg (4 mg Intravenous Given 09/27/23 0625)  fentaNYL  (SUBLIMAZE ) injection 50 mcg (50 mcg Intravenous Given 09/27/23 0723)    Clinical Course as of 09/27/23 0753  Fri Sep 27, 2023  0621 Patient presents after mechanical fall while working at [employer].  He may have struck his head, but does not recall that and no LOC.  However he is on DOAC, will obtain CT head  Patient likely has a right hip fracture.  No signs of any chest or abdominal trauma.  No signs of back trauma  Last dose of Eliquis  was yesterday around 9 PM [DW]  9344 D/w dr gebauer with ortho, will see patient [DW]  [phone] CT head is negative. Secondary survey, patient has no other signs of acute traumatic injury except for right hip fracture.  Patient was updated on plan and need for operative management [DW]  [phone] Discussed with Dr. Trixie for admission [DW]    Clinical Course User Index [DW] Midge Golas, MD           Glasgow Coma Scale Score: 15      NEXUS Criteria Score: 0                Medical Decision Making Amount and/or Complexity of Data Reviewed Labs: ordered. Radiology: ordered.  Risk Prescription drug management. Decision regarding hospitalization.   This patient presents to the ED for concern of fall with hip pain, this involves an extensive number of treatment options, and is a complaint that carries with it a high risk of complications and morbidity.  The differential diagnosis includes but is not limited to subdural hematoma, subarachnoid hemorrhage, skull fracture, concussion Hip dislocation, hip fracture, muscle strain  Comorbidities that complicate the patient evaluation: Patient's presentation is complicated by their history of CHF, CVA  Social Determinants of Health: Patient works at a grocery store   increases the complexity of managing their presentation  Additional history obtained: Records reviewed outpatient records reviewed  Lab Tests: I Ordered, and personally interpreted labs.  The pertinent results include: Leukocytosis  Imaging Studies ordered: I ordered imaging studies including CT scan head and X-ray chest and right hip  I independently visualized and interpreted imaging which showed right hip fracture I agree with the radiologist interpretation  Cardiac Monitoring: The patient was maintained on a cardiac monitor.  I personally viewed and interpreted the cardiac monitor which showed an underlying rhythm of:  sinus rhythm  Medicines ordered and prescription drug management: I ordered medication including fentanyl   for pain  Reevaluation of the patient after these medicines showed that  the patient    stayed the same  Critical Interventions:  admission for operative management  Consultations Obtained: I requested consultation with the admitting physician [facility] and consultant orthopedics dr gebauer, and discussed  findings as well as pertinent plan - they recommend: admit  Reevaluation: After the interventions noted above, I reevaluated the patient and found that they have :stayed the same  Complexity of problems addressed: Patient's presentation is most consistent with  acute presentation with potential threat to life or bodily function  Disposition: After consideration of the diagnostic results and the patient's response to treatment,  I feel that the patent would benefit from admission  .        Final diagnoses:  Closed displaced intertrochanteric fracture of right femur, initial encounter [facility])    ED Discharge Orders     None          Midge Golas, MD 09/27/23 [phone]

## 2023-09-27 NOTE — Anesthesia Preprocedure Evaluation (Signed)
 Anesthesia Evaluation  Patient identified by MRN, date of birth, ID band Patient awake    Reviewed: Allergy & Precautions, NPO status , Patient's Chart, lab work & pertinent test results, reviewed documented beta blocker date and time   Airway Mallampati: II  TM Distance: >3 FB     Dental  (+) Edentulous Upper, Edentulous Lower   Pulmonary former smoker   Pulmonary exam normal breath sounds clear to auscultation       Cardiovascular hypertension, Pt. on medications and Pt. on home beta blockers + CAD and +CHF  Normal cardiovascular exam+ Cardiac Defibrillator  Rhythm:Regular Rate:Normal  LVEF 25-30%  Echo 03/10/21  1. Left ventricular ejection fraction, by estimation, is 25 to 30%. The  left ventricle has severely decreased function. The left ventricle has no  regional wall motion abnormalities. There is moderate concentric left  ventricular hypertrophy. Left  ventricular diastolic parameters are consistent with Grade I diastolic  dysfunction (impaired relaxation).   2. Right ventricular systolic function is normal. The right ventricular  size is normal. Tricuspid regurgitation signal is inadequate for assessing  PA pressure.   3. The mitral valve is normal in structure. Trivial mitral valve  regurgitation. No evidence of mitral stenosis.   4. The aortic valve is tricuspid. Aortic valve regurgitation is not  visualized. Aortic valve sclerosis/calcification is present, without any  evidence of aortic stenosis.   5. There is mild dilatation of the aortic root, measuring 37 mm. There is  mild dilatation of the ascending aorta, measuring 37 mm.   6. The inferior vena cava is normal in size with greater than 50%  respiratory variability, suggesting right atrial pressure of 3 mmHg.   EKG 09/27/23 NSR, LVH, Early transition    Neuro/Psych CVA, No Residual Symptoms  negative psych ROS   GI/Hepatic negative GI ROS, Neg liver  ROS,,,  Endo/Other  diabetes, Well Controlled, Type 2, Insulin  Dependent, Oral Hypoglycemic Agents  HLD GLP-1 RA therapy- last dose 9/21  Renal/GU negative Renal ROS  negative genitourinary   Musculoskeletal  (+) Arthritis , Osteoarthritis,  Intertrochanteric right hip Fx   Abdominal   Peds  Hematology Eliquis  therapy- last dose last night   Anesthesia Other Findings   Reproductive/Obstetrics                              Anesthesia Physical Anesthesia Plan  ASA: 3  Anesthesia Plan: General   Post-op Pain Management: Dilaudid  IV   Induction: Intravenous  PONV Risk Score and Plan: 3 and Treatment may vary due to age or medical condition, Ondansetron  and Dexamethasone   Airway Management Planned: Oral ETT  Additional Equipment: None  Intra-op Plan:   Post-operative Plan:   Informed Consent: I have reviewed the patients History and Physical, chart, labs and discussed the procedure including the risks, benefits and alternatives for the proposed anesthesia with the patient or authorized representative who has indicated his/her understanding and acceptance.       Plan Discussed with: Anesthesiologist and CRNA  Anesthesia Plan Comments:          Anesthesia Quick Evaluation

## 2023-09-27 NOTE — Consult Note (Signed)
 Orthopedic Consult  Patient ID: John [name] MRN: [id number] DOB/AGE: 1960-07-22 63 y.o.  Reason for Consult: Right hip fracture Referring Physician: Trixie  HPI: John [name] is an 63 y.o. male who was at work yesterday evening.  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  He denies any other injuries or result of his fall.  Denies any loss of consciousness.  He reports some pre-existing knee arthritis but no pain in the hips prior to the injury.  Past Medical History:  Diagnosis Date   Arthritis    Diabetes mellitus without complication (HCC)    Hyperlipidemia    Hypertension    Stroke [facility]) 05/2020    Past Surgical History:  Procedure Laterality Date   ICD IMPLANT N/A 06/02/2021   Procedure: ICD IMPLANT;  Surgeon: Fernande Elspeth BROCKS, MD;  Location: [facility] INVASIVE CV LAB;  Service: Cardiovascular;  Laterality: N/A;   LEFT HEART CATH AND CORONARY ANGIOGRAPHY N/A 08/18/2020   Procedure: LEFT HEART CATH AND CORONARY ANGIOGRAPHY;  Surgeon: [place], Peter M, MD;  Location: [facility] INVASIVE CV LAB;  Service: Cardiovascular;  Laterality: N/A;   TONSILLECTOMY      Family History  Problem Relation Age of Onset   Diabetes Mother    CAD Mother    Heart attack Mother        x 2   COPD Father    Colon cancer Neg Hx    Esophageal cancer Neg Hx    Rectal cancer Neg Hx    Stomach cancer Neg Hx     Social History:  reports that he has quit smoking. He has never used smokeless tobacco. He reports current alcohol use. He reports that he does not use drugs.  Allergies: No Known Allergies  Medications: I have reviewed the patient's current medications.  ROS: Constitutional: No fever or chills Vision: No changes in vision ENT: No difficulty swallowing CV: No chest pain Pulm: No SOB or wheezing GI: No nausea or vomiting GU: No urgency or inability to hold  urine Skin: No poor wound healing Neurologic: No numbness or tingling Psychiatric: No depression or anxiety Heme: No bruising Allergic: No reaction to medications or food   Exam: Blood pressure 108/82, pulse 97, temperature (!) 97.4 F (36.3 C), resp. rate 15, height 5' 9 (1.753 m), weight 76 kg, SpO2 98%. General: Well-appearing gentleman in no acute distress Orientation: Alert and oriented x 3 Mood and Affect: Mood is calm  Injured Extremity (CV, lymph, sensation, reflexes): Right leg is shortened and externally rotated.  He has no tense about the knee or ankle.  He has intact sensation in the saphenous, sural, tibial, and peroneal nerve distributions.  He has 5 out of 5 strength EHL, FHL, gastrocs, and tibialis anterior.  Examination of the left lower extremity as well as bilateral upper extremities reveals no tenderness to palpation, no crepitus defects or deformities    Medical Decision Making: Data: Imaging: AP pelvis 2 views of the right hip reveal a displaced intertrochanteric hip fracture  Labs: White cell count of 14.1, hemoglobin of 15.8, hematocrit of 46.4, INR 1.1    Medical history and chart was reviewed and case discussed with medical provider.  Assessment/Plan: The patient does have a right trochanteric hip fracture.  This require surgical stabilization.  We discussed the goals of surgery to stabilize the  fracture provide for early ambulation permit the morbidity associated with prolonged immobilization including bedsores pneumonias and blood clots.  We discussed that surgery does have risks which are including but not limiting to bleeding, infection, injury to nerves or tendons, nonunion, malunion, leg length discrepancy, hardware failure, and the risk of blood clots.  Informed consent was obtained.  Will plan for surgery later today.  We discussed expected rehab course after surgery.  New problem w/ workup planned: High complexity diagnosis (Level 5) Surgery w/  risks or Emergency surgery: High complexity Risk (Level 5)  All others are Level 4 with comprehensive musculoskeletal exam.  Cordella Rhein, MD, MS [facility] Specialist [phone]

## 2023-09-27 NOTE — Discharge Instructions (Signed)
 Orthopaedic Discharge Instructions   General Discharge Instructions  WEIGHT BEARING STATUS: As tolerated  RANGE OF MOTION/ACTIVITY: No restriction  Wound Care: You may remove your surgical dressing on 10/01/23 Incisions can be left open to air if there is no drainage. Once the incision is completely dry and without drainage, it may be left open to air out.  Showering may begin 9 - 30 - 25.  Clean incision gently with soap and water.  DVT/PE prophylaxis: Continue patient's Eliquis   Diet: as you were eating previously.  Can use over the counter stool softeners and bowel preparations, such as Miralax , to help with bowel movements.  Narcotics can be constipating.  Be sure to drink plenty of fluids  PAIN MEDICATION USE AND EXPECTATIONS  You have likely been given narcotic medications to help control your pain.  After a traumatic event that results in an fracture (broken bone) with or without surgery, it is ok to use narcotic pain medications to help control one's pain.  We understand that everyone responds to pain differently and each individual patient will be evaluated on a regular basis for the continued need for narcotic medications. Ideally, narcotic medication use should last no more than 6-8 weeks (coinciding with fracture healing).   As a patient it is your responsibility as well to monitor narcotic medication use and report the amount and frequency you use these medications when you come to your office visit.   We would also advise that if you are using narcotic medications, you should take a dose prior to therapy to maximize you participation.  IF YOU ARE ON NARCOTIC MEDICATIONS IT IS NOT PERMISSIBLE TO OPERATE A MOTOR VEHICLE (MOTORCYCLE/CAR/TRUCK/MOPED) OR HEAVY MACHINERY DO NOT MIX NARCOTICS WITH OTHER CNS (CENTRAL NERVOUS SYSTEM) DEPRESSANTS SUCH AS ALCOHOL  POST-OPERATIVE OPIOID TAPER INSTRUCTIONS: It is important to wean off of your opioid medication as soon as possible. If you do  not need pain medication after your surgery it is ok to stop day one. Opioids include: Codeine, Hydrocodone (Norco, Vicodin), Oxycodone (Percocet, oxycontin ) and hydromorphone  amongst others.  Long term and even short term use of opiods can cause: Increased pain response Dependence Constipation Depression Respiratory depression And more.  Withdrawal symptoms can include Flu like symptoms Nausea, vomiting And more Techniques to manage these symptoms Hydrate well Eat regular healthy meals Stay active Use relaxation techniques(deep breathing, meditating, yoga) Do Not substitute Alcohol to help with tapering If you have been on opioids for less than two weeks and do not have pain than it is ok to stop all together.  Plan to wean off of opioids This plan should start within one week post op of your fracture surgery  Maintain the same interval or time between taking each dose and first decrease the dose.  Cut the total daily intake of opioids by one tablet each day Next start to increase the time between doses. The last dose that should be eliminated is the evening dose.    STOP SMOKING OR USING NICOTINE PRODUCTS!!!!  As discussed nicotine severely impairs your body's ability to heal surgical and traumatic wounds but also impairs bone healing.  Wounds and bone heal by forming microscopic blood vessels (angiogenesis) and nicotine is a vasoconstrictor (essentially, shrinks blood vessels).  Therefore, if vasoconstriction occurs to these microscopic blood vessels they essentially disappear and are unable to deliver necessary nutrients to the healing tissue.  This is one modifiable factor that you can do to dramatically increase your chances of healing your injury.  (This means  no smoking, no nicotine gum, patches, etc)  DO NOT USE NONSTEROIDAL ANTI-INFLAMMATORY DRUGS (NSAID'S)  Using products such as Advil  (ibuprofen ), Aleve (naproxen), Motrin  (ibuprofen ) for additional pain control during  fracture healing can delay and/or prevent the healing response.  If you would like to take over the counter (OTC) medication, Tylenol  (acetaminophen ) is ok.  However, some narcotic medications that are given for pain control contain acetaminophen  as well. Therefore, you should not exceed more than 4000 mg of tylenol  in a day if you do not have liver disease.  Also note that there are may OTC medicines, such as cold medicines and allergy medicines that my contain tylenol  as well.  If you have any questions about medications and/or interactions please ask your doctor/PA or your pharmacist.      ICE AND ELEVATE INJURED/OPERATIVE EXTREMITY  Using ice and elevating the injured extremity above your heart can help with swelling and pain control.  Icing in a pulsatile fashion, such as 20 minutes on and 20 minutes off, can be followed.    Do not place ice directly on skin. Make sure there is a barrier between to skin and the ice pack.    Using frozen items such as frozen peas works well as the conform nicely to the are that needs to be iced.    Discharge Wound Care Instructions  Do NOT apply any ointments, solutions or lotions to pin sites or surgical wounds.  These prevent needed drainage and even though solutions like hydrogen peroxide kill bacteria, they also damage cells lining the pin sites that help fight infection.  Applying lotions or ointments can keep the wounds moist and can cause them to breakdown and open up as well. This can increase the risk for infection. When in doubt call the office.  Surgical incisions should be dressed daily.  If any drainage is noted, use one layer of adaptic or Mepitel, then gauze, Kerlix, and an ace wrap. - These dressing supplies should be available at local medical supply stores ([facility], [facility], etc) as well as Insurance claims handler ([facility], [facility], [place], etc)  Once the incision is completely dry and without drainage, it may be left open to air out.   Showering may begin 36-48 hours later.  Cleaning gently with soap and water.  Traumatic wounds should be dressed daily as well.    One layer of adaptic, gauze, Kerlix, then ace wrap.  The adaptic can be discontinued once the draining has ceased    If you have a wet to dry dressing: wet the gauze with saline the squeeze as much saline out so the gauze is moist (not soaking wet), place moistened gauze over wound, then place a dry gauze over the moist one, followed by Kerlix wrap, then ace wrap.    Call office for the following: Temperature greater than 101F Persistent nausea and vomiting Severe uncontrolled pain Redness, tenderness, or signs of infection (pain, swelling, redness, odor or green/yellow discharge around the site) Difficulty breathing, headache or visual disturbances Hives Persistent dizziness or light-headedness Extreme fatigue Any other questions or concerns you may have after discharge  In an emergency, call 911 or go to an Emergency Department at a nearby hospital  OTHER HELPFUL INFORMATION  If you had a block, it will wear off between 8-24 hrs postop typically.  This is period when your pain may go from nearly zero to the pain you would have had postop without the block.  This is an abrupt transition but nothing  dangerous is happening.  You may take an extra dose of narcotic when this happens.  You should wean off your narcotic medicines as soon as you are able.  Most patients will be off or using minimal narcotics before their first postop appointment.   We suggest you use the pain medication the first night prior to going to bed, in order to ease any pain when the anesthesia wears off. You should avoid taking pain medications on an empty stomach as it will make you nauseous.  Do not drink alcoholic beverages or take illicit drugs when taking pain medications.  In most states it is against the law to drive while you are in a splint or sling.  And certainly against the  law to drive while taking narcotics.  You may return to work/school in the next couple of days when you feel up to it.   Pain medication may make you constipated.  Below are a few solutions to try in this order: Decrease the amount of pain medication if you aren't having pain. Drink lots of decaffeinated fluids. Drink prune juice and/or each dried prunes  If the first 3 don't work start with additional solutions Take Colace - an over-the-counter stool softener Take Senokot - an over-the-counter laxative Take Miralax  - a stronger over-the-counter laxative   Follow up with Dr Reyne in 2 weeks  Cordella Reyne, MD, MS [facility] [phone]

## 2023-09-27 NOTE — Anesthesia Postprocedure Evaluation (Signed)
 Anesthesia Post Note  Patient: RASMUS PREUSSER II  Procedure(s) Performed: FIXATION, FRACTURE, INTERTROCHANTERIC, WITH INTRAMEDULLARY ROD (Right)     Patient location during evaluation: PACU Anesthesia Type: General Level of consciousness: awake and alert and oriented Pain management: pain level controlled Vital Signs Assessment: post-procedure vital signs reviewed and stable Respiratory status: spontaneous breathing, nonlabored ventilation and respiratory function stable Cardiovascular status: blood pressure returned to baseline and stable Postop Assessment: no apparent nausea or vomiting Anesthetic complications: no   No notable events documented.  Last Vitals:  Vitals:   09/27/23 1715 09/27/23 1738  BP: 119/79 113/81  Pulse: 95 95  Resp: 17 16  Temp: 37 C 36.7 C  SpO2: 94% 100%    Last Pain:  Vitals:   09/27/23 1738  TempSrc: Oral  PainSc:                  Lauramae Kneisley A.

## 2023-09-27 NOTE — Transfer of Care (Signed)
 Immediate Anesthesia Transfer of Care Note  Patient: John [name]  Procedure(s) Performed: FIXATION, FRACTURE, INTERTROCHANTERIC, WITH INTRAMEDULLARY ROD (Right)  Patient Location: PACU  Anesthesia Type:General  Level of Consciousness: awake, alert , and oriented  Airway & Oxygen Therapy: Patient Spontanous Breathing and Patient connected to face mask oxygen  Post-op Assessment: Report given to RN and Post -op Vital signs reviewed and stable  Post vital signs: Reviewed and stable  Last Vitals:  Vitals Value Taken Time  BP 102/82 09/27/23 16:52  Temp    Pulse 96 09/27/23 16:55  Resp 22 09/27/23 16:55  SpO2 95 % 09/27/23 16:55  Vitals shown include unfiled device data.  Last Pain:  Vitals:   09/27/23 1421  TempSrc:   PainSc: 9          Complications: No notable events documented.

## 2023-09-27 NOTE — H&P (Signed)
 History and Physical    John [name] FMW:[id number] DOB: [birth year] DOA: 09/27/2023  I have briefly reviewed the patient's prior medical records in [facility]  PCP: Berneta Elsie Sayre, MD  Patient coming from: home  Chief Complaint: right hip pain  HPI: John [name] is a 63 y.o. male with medical history significant of CAD, ischemic cardiomyopathy managed medically, prior CVA, HTN, HLD, DM 2, chronic systolic CHF with an ICD implant comes into the hospital after ground-level fall.  He works nights, [employer], was pulling on a heavy object then lost grip and balance and landed on his back.  Following that, he experienced right hip pain, was unable to walk and came to the hospital.  Patient currently denies any chest pain, shortness of breath, dyspnea on exertion.  He denies any lower extremity swelling.  Prior to the fall he was active, able to do all his ADLs without any chest discomfort or dyspnea.  He reports compliance with his home medications and last time he took Eliquis  was the night prior to admission.  ED Course: In the emergency room he is afebrile, normotensive, satting well on room air.  Blood work shows a creatinine of 1.4, white count 14.  Imaging showed right hip fracture, orthopedic surgery was consulted and we are asked to admit.    Review of Systems: All systems reviewed, and apart from HPI, all negative  Past Medical History:  Diagnosis Date   Arthritis    Diabetes mellitus without complication (HCC)    Hyperlipidemia    Hypertension    Stroke (HCC) 05/2020    Past Surgical History:  Procedure Laterality Date   ICD IMPLANT N/A 06/02/2021   Procedure: ICD IMPLANT;  Surgeon: Fernande Elspeth BROCKS, MD;  Location: [facility] INVASIVE CV LAB;  Service: Cardiovascular;  Laterality: N/A;   LEFT HEART CATH AND CORONARY ANGIOGRAPHY N/A 08/18/2020   Procedure: LEFT HEART CATH AND CORONARY ANGIOGRAPHY;  Surgeon: [place], Peter M, MD;  Location: [facility] INVASIVE CV LAB;   Service: Cardiovascular;  Laterality: N/A;   TONSILLECTOMY       reports that he has quit smoking. He has never used smokeless tobacco. He reports current alcohol use. He reports that he does not use drugs.  No Known Allergies  Family History  Problem Relation Age of Onset   Diabetes Mother    CAD Mother    Heart attack Mother        x 2   COPD Father    Colon cancer Neg Hx    Esophageal cancer Neg Hx    Rectal cancer Neg Hx    Stomach cancer Neg Hx     Prior to Admission medications   Medication Sig Start Date End Date Taking? Authorizing Provider  acetaminophen  (TYLENOL ) 500 MG tablet Take 500 mg by mouth every 6 (six) hours as needed for moderate pain or headache.    [provider]  apixaban  (ELIQUIS ) 5 MG TABS tablet TAKE 1 TABLET BY MOUTH 2 TIMES A DAY 07/17/23   Pietro Redell RAMAN, MD  carvedilol  (COREG ) 12.5 MG tablet TAKE ONE TABLET BY MOUTH TWICE A DAY WITH A MEAL 07/23/22   Pietro Redell RAMAN, MD  dapagliflozin  propanediol (FARXIGA ) 10 MG TABS tablet Take 1 tablet (10 mg total) by mouth daily. 02/28/23   Shamleffer, Ibtehal Jaralla, MD  diclofenac  Sodium (VOLTAREN ) 1 % GEL Rub a small grape sized dollop into knee up to 3 times daily as needed. 09/08/20   Berneta Elsie  Sim, MD  Evolocumab  (REPATHA  SURECLICK) 140 MG/ML SOAJ INJECT 1 MILLILITER UNDER THE SKIN EVERY 14 DAYS 12/14/22   Pietro Redell RAMAN, MD  ezetimibe  (ZETIA ) 10 MG tablet Take 1 tablet (10 mg total) by mouth daily. 11/13/22   Berneta Elsie Sim, MD  glipiZIDE  (GLUCOTROL  XL) 10 MG 24 hr tablet Take 1 tablet (10 mg total) by mouth daily with breakfast. 07/08/23   Shamleffer, Ibtehal Jaralla, MD  glucose blood test strip 1 each by Other route 2 (two) times daily. And lancets 2/day 03/10/18   Kassie Mallick, MD  insulin  glargine (LANTUS  SOLOSTAR) 100 UNIT/ML Solostar Pen Inject 45 Units into the skin every morning. And pen needles 1/day 07/08/23   Shamleffer, Ibtehal Jaralla, MD  Insulin  Pen Needle (PEN  NEEDLES) 32G X 4 MM MISC 1 Package by Does not apply route daily in the afternoon. 02/28/23   Shamleffer, Ibtehal Jaralla, MD  sacubitril-valsartan (ENTRESTO ) 24-26 MG TAKE 1 TABLET BY MOUTH TWICE A DAY 02/12/23   Pietro Redell RAMAN, MD  sildenafil  (VIAGRA ) 100 MG tablet Take 0.5-1 tablets (50-100 mg total) by mouth daily as needed for erectile dysfunction. 11/13/22   Berneta Elsie Sim, MD  spironolactone  (ALDACTONE ) 25 MG tablet TAKE 1/2 TABLET BY MOUTH DAILY 08/08/22   Fernande Elspeth BROCKS, MD  tirzepatide  (MOUNJARO ) 12.5 MG/0.5ML Pen Inject 12.5 mg into the skin once a week. 07/08/23   Shamleffer, Donell Cardinal, MD    Physical Exam: Vitals:   09/27/23 0650 09/27/23 0700 09/27/23 0715 09/27/23 0716  BP:  130/84 108/82   Pulse: 93 93 96 97  Resp: 18 10 (!) 9 15  Temp:      SpO2: 93% 97% 99% 98%  Weight:      Height:       Constitutional: NAD, calm, comfortable Eyes: PERRL, lids and conjunctivae normal ENMT: Mucous membranes are moist. Posterior pharynx clear of any exudate or lesions.Normal dentition.  Neck: normal, supple Respiratory: clear to auscultation bilaterally, no wheezing, no crackles. Normal respiratory effort. No accessory muscle use.  Cardiovascular: Regular rate and rhythm, no murmurs / rubs / gallops. No extremity edema. 2+ pedal pulses.  Abdomen: no tenderness, no masses palpated. Bowel sounds positive.  Musculoskeletal: no clubbing / cyanosis. Normal muscle tone.  Skin: no rashes, lesions, ulcers. No induration Neurologic: CN 2-12 grossly intact. Strength 5/5 in all 4.   Labs on Admission: I have personally reviewed following labs and imaging studies  CBC: Recent Labs  Lab 09/27/23 0604  WBC 14.1*  NEUTROABS 11.3*  HGB 15.6  HCT 46.4  MCV 94.3  PLT 336   Basic Metabolic Panel: Recent Labs  Lab 09/27/23 0604  NA 137  K 4.5  CL 105  CO2 19*  GLUCOSE 165*  BUN 27*  CREATININE 1.41*  CALCIUM  8.8*   Liver Function Tests: No results for input(s): AST,  ALT, ALKPHOS, BILITOT, PROT, ALBUMIN in the last 168 hours. Coagulation Profile: Recent Labs  Lab 09/27/23 0604  INR 1.1   BNP (last 3 results) No results for input(s): PROBNP in the last 8760 hours. CBG: No results for input(s): GLUCAP in the last 168 hours. Thyroid  Function Tests: No results for input(s): TSH, T4TOTAL, FREET4, T3FREE, THYROIDAB in the last 72 hours. Urine analysis:    Component Value Date/Time   COLORURINE YELLOW 11/13/2022 1135   APPEARANCEUR CLEAR 11/13/2022 1135   LABSPEC 1.015 11/13/2022 1135   PHURINE 6.0 11/13/2022 1135   GLUCOSEU >=1000 (A) 11/13/2022 1135   HGBUR NEGATIVE 11/13/2022 1135  BILIRUBINUR NEGATIVE 11/13/2022 1135   KETONESUR NEGATIVE 11/13/2022 1135   PROTEINUR NEGATIVE 05/31/2020 1711   UROBILINOGEN 0.2 11/13/2022 1135   NITRITE NEGATIVE 11/13/2022 1135   LEUKOCYTESUR NEGATIVE 11/13/2022 1135     Radiological Exams on Admission: DG Chest 1 View Result Date: 09/27/2023 EXAM: 1 VIEW(S) XRAY OF THE CHEST 09/27/2023 06:25:47 AM COMPARISON: AP radiograph of the chest dated 06/02/2021. CLINICAL HISTORY: pain FINDINGS: LINES, TUBES AND DEVICES: Left chest wall ICD in place. LUNGS AND PLEURA: Low lung volumes. No focal pulmonary opacity. No pulmonary edema. No pleural effusion. No pneumothorax. HEART AND MEDIASTINUM: No acute abnormality of the cardiac and mediastinal silhouettes. BONES AND SOFT TISSUES: Multilevel thoracic osteophytosis. IMPRESSION: 1. No acute cardiopulmonary abnormality. 2. Left chest wall implantable cardioverter-defibrillator in place. Electronically signed by: Evalene Coho MD 09/27/2023 06:53 AM EDT RP Workstation: [id number]   CT HEAD WO CONTRAST Result Date: 09/27/2023 EXAM: CT HEAD WITHOUT CONTRAST 09/27/2023 06:44:20 AM TECHNIQUE: CT of the head was performed without the administration of intravenous contrast. Automated exposure control, iterative reconstruction, and/or weight based adjustment  of the mA/kV was utilized to reduce the radiation dose to as low as reasonably achievable. COMPARISON: CT angiogram of the head dated 05/31/2020. CLINICAL HISTORY: Head trauma, moderate-severe. Pt BIB [facility] from work at [employer], after ground level fall. Pt was reaching above his head when he lost balance and fell backwards. Pt denies LOC, did hit head and has 2 cm Lac; bleeding controlled. Pt does take eliquis . FINDINGS: BRAIN AND VENTRICLES: No acute hemorrhage. No evidence of acute infarct. No hydrocephalus. No extra-axial collection. No mass effect or midline shift. ORBITS: No acute abnormality. SINUSES: No acute abnormality. SOFT TISSUES AND SKULL: 2 cm Lac; bleeding controlled. No skull fracture. IMPRESSION: 1. No acute intracranial abnormality. Electronically signed by: Evalene Coho MD 09/27/2023 06:52 AM EDT RP Workstation: [id number]   DG Hip Unilat With Pelvis 2-3 Views Right Result Date: 09/27/2023 EXAM: 2 OR MORE VIEW(S) XRAY OF THE RIGHT HIP 09/27/2023 06:25:47 AM COMPARISON: None available. CLINICAL HISTORY: pain FINDINGS: BONES AND JOINTS: Acute comminuted intertrochanteric fracture of the right femur with medially displaced lesser trochanter by 1.2 cm. The hip joint is maintained. No significant degenerative changes. SOFT TISSUES: Vascular calcifications. IMPRESSION: 1. Acute comminuted intertrochanteric fracture of the right femur with medially displaced lesser trochanter by 1.2 cm. Electronically signed by: Waddell Calk MD 09/27/2023 06:51 AM EDT RP Workstation: [id number]    EKG: Independently reviewed. Sinus rhythm   Assessment/Plan Principal problem Right hip fracture -orthopedic surgery consulted, appreciate input.  Last Eliquis  dose was 9/20 5 in the evening prior to him going to work - DVT prophylaxis per orthopedic surgery, for now hold Eliquis  - Calculated Charlanne perioperative risk for MI 0.6  Active problems CAD, ischemic cardiomyopathy -prior to his fall he has no  ischemic symptoms, no angina, able to do all his ADLs work at full capacity without any issues.  There is no evidence of fluid overload, do not recommend additional workup at this point.  ICD was interrogated just a few weeks ago without issues  Prior CVA-no residual deficits, able to work/do all his ADLs  Chronic systolic CHF -most recent 2D echo March 2023 LVEF 25-30%, no WMA, grade 1 diastolic dysfunction, RV size and systolic function were normal.  Resume home Coreg  at a lower dose given normotension, hold Entresto  perioperatively, continue spironolactone   HTN -hold Entresto  perioperatively  HLD -continue home medications  CKD 3 A -baseline creatinine 1.1-1.4, currently at baseline  DM  2 -placed on glargine/sliding scale   DVT prophylaxis: SCDs, Eliquis  last night Code Status: Full code Family Communication: No family at bedside Bed Type: Medical telemetry Consults called: Orthopedic surgery Obs/Inp: Inpatient  At the time of admission, it appears that the appropriate admission status for this patient is INPATIENT as it is expected that patient will require hospital care > 2 midnights. This is judged to be reasonable and necessary in order to provide the required intensity of service to ensure the patient's safety given: presenting symptoms, initial radiographic and laboratory data and in the context of their chronic comorbidities. Together, these circumstances are felt to place patient at high at high risk for further clinical deterioration threatening life, limb, or organ.  Nilda Fendt, MD, PhD [facility]  Contact via [email or web address]  09/27/2023, 7:56 AM

## 2023-09-27 NOTE — ED Triage Notes (Signed)
 Pt BIB [facility] from work at [employer], after ground level fall. Pt was reaching above his head when he lost balance and fell backwards. Pt denies LOC, did hit head and has 2 cm Lac; bleeding controlled. Pt does take eliquis . Pt c/o R hip pain. Per EMS R leg is shorter, rotating outward, and swelling.

## 2023-09-27 NOTE — Anesthesia Procedure Notes (Signed)
 Procedure Name: Intubation Date/Time: 09/27/2023 3:52 PM  Performed by: Mannie Krystal LABOR, CRNAPre-anesthesia Checklist: Patient identified, Emergency Drugs available, Suction available and Patient being monitored Patient Re-evaluated:Patient Re-evaluated prior to induction Oxygen Delivery Method: Circle system utilized Preoxygenation: Pre-oxygenation with 100% oxygen Induction Type: IV induction Ventilation: Mask ventilation without difficulty Laryngoscope Size: Miller and 2 Grade View: Grade I Tube type: Oral Tube size: 7.5 mm Number of attempts: 1 Airway Equipment and Method: Stylet Placement Confirmation: ETT inserted through vocal cords under direct vision, positive ETCO2 and breath sounds checked- equal and bilateral Secured at: 22 cm Tube secured with: Tape Dental Injury: Teeth and Oropharynx as per pre-operative assessment

## 2023-09-27 NOTE — Op Note (Signed)
 DATE OF SURGERY:  09/27/2023  TIME: 4:50 PM  PATIENT NAME:  John [name]  AGE: 63 y.o.  PRE-OPERATIVE DIAGNOSIS:  Right Hip Fracture  POST-OPERATIVE DIAGNOSIS:  SAME  PROCEDURE:  FIXATION, FRACTURE, INTERTROCHANTERIC, WITH INTRAMEDULLARY ROD  SURGEON:  Cordella SHAUNNA Rhein  ASSISTANT:  none  OPERATIVE IMPLANTS:  Implant Name Type Inv. Item Serial No. Manufacturer Lot No. LRB No. Used Action  NAIL CANN TFNA DEG [id number] - [id number] Nail NAIL CANN TFNA DEG [id number]  [employer] [id number] Right 1 Implanted  SCREW TFNA HELICAL 105 - [id number] Screw SCREW TFNA HELICAL 105  [employer]PU[employer]TH[employer]  Right 1 Implanted  SCREW LOCK IM NAIL 5.0X34 - [id number] Screw SCREW LOCK IM NAIL 5.0X34  [employer]TH[employer]  Right 1 Implanted    UNIQUE ASPECTS OF THE CASE:  none  ESTIMATED BLOOD LOSS: 100  PREOPERATIVE INDICATIONS:  John [name] is a 63 y.o. year old who fell and suffered an Right Hip Fracture. He was brought into the ER and then admitted and optimized and then elected for surgical intervention.    The risks benefits and alternatives were discussed with the patient including but not limited to the risks of nonoperative treatment, versus surgical intervention including infection, bleeding, nerve injury, malunion, nonunion, hardware prominence, hardware failure, need for hardware removal, blood clots, cardiopulmonary complications, morbidity, mortality, among others, and they were willing to proceed.    OPERATIVE PROCEDURE:  The patient was brought to the operating room and placed in the supine position. Anesthesia was administered. He was placed on the fracture table.  Closed reduction was performed under C-arm guidance.  Time out was then performed after sterile prep and drape. He received preoperative antibiotics.  Small incision proximal to the greater trochanter was made and carried down through skin and subcutaneous tissue.  Threaded guidewire was directed at the tip  of the greater trochanter and advanced into the proximal metaphysis.  Positioning was confirmed with fluoroscopy.  I then used an entry reamer to enter the medullary canal.  I then passed a 11x 130 mm DeP[employer]ynthes TFN nail down the center of the canal attached to the targeting arm.  I then used the targeting arm to make a percutaneous incision and directed a threaded guidewire up into the head/neck segment.  I confirmed adequate tip apex distance and measured the length.  I decided to place a 95 mm screw.  I then drilled the path for the compression screw and placed an antirotation bar.  IThe proximal portion of the nail was statically locked.   I used the targeting arm to place a distal interlocking screw.  The targeting arm was removed.  Final fluoroscopic imaging was obtained.    The wounds were irrigated copiously, and vancomycin powder was placed in the wounds.  The gluteal fascia was closed with 0 Vicryl, and skin was closed with 2-0 Vicryl and 3-0 Monocryl.  Sterile dressing was applied with Dermabond, 4 x 4 gauze, and Tegaderm.  The patient was awakened and returned to PACU in stable and satisfactory condition. There were no complications and the patient tolerated the procedure well.   Post op recs: WB: WBAT right LE Abx: ancef  x23 hours post op Dressing: keep intact until follow up, change PRN if soiled or saturated. DVT prophylaxis: Continue Eliquis  Follow up: 2 weeks after surgery for a wound check with Dr. Rhein at [facility].  Address: [address], [place], [place] [address]  Office Phone: [phone]  Cathlyn Rhein, MD Orthopaedic Surgery

## 2023-09-28 DIAGNOSIS — S72001D Fracture of unspecified part of neck of right femur, subsequent encounter for closed fracture with routine healing: Secondary | ICD-10-CM

## 2023-09-28 LAB — COMPREHENSIVE METABOLIC PANEL WITH GFR
ALT: 19 U/L (ref 0–44)
AST: 23 U/L (ref 15–41)
Albumin: 3.4 g/dL — ABNORMAL LOW (ref 3.5–5.0)
Alkaline Phosphatase: 54 U/L (ref 38–126)
Anion gap: 13 (ref 5–15)
BUN: 24 mg/dL — ABNORMAL HIGH (ref 8–23)
CO2: 21 mmol/L — ABNORMAL LOW (ref 22–32)
Calcium: 8.6 mg/dL — ABNORMAL LOW (ref 8.9–10.3)
Chloride: 102 mmol/L (ref 98–111)
Creatinine, Ser: 1.36 mg/dL — ABNORMAL HIGH (ref 0.61–1.24)
GFR, Estimated: 59 mL/min — ABNORMAL LOW (ref >60)
Glucose, Bld: 165 mg/dL — ABNORMAL HIGH (ref 70–99)
Potassium: 3.9 mmol/L (ref 3.5–5.1)
Sodium: 136 mmol/L (ref 135–145)
Total Bilirubin: 0.9 mg/dL (ref 0.0–1.2)
Total Protein: 6 g/dL — ABNORMAL LOW (ref 6.5–8.1)

## 2023-09-28 LAB — CBC
HCT: 43.2 % (ref 39.0–52.0)
Hemoglobin: 14.8 g/dL (ref 13.0–17.0)
MCH: 31.4 pg (ref 26.0–34.0)
MCHC: 34.3 g/dL (ref 30.0–36.0)
MCV: 91.7 fL (ref 80.0–100.0)
Platelets: 327 K/uL (ref 150–400)
RBC: 4.71 MIL/uL (ref 4.22–5.81)
RDW: 12.9 % (ref 11.5–15.5)
WBC: 14.5 K/uL — ABNORMAL HIGH (ref 4.0–10.5)
nRBC: 0 % (ref 0.0–0.2)

## 2023-09-28 LAB — GLUCOSE, CAPILLARY
Glucose-Capillary: 151 mg/dL — ABNORMAL HIGH (ref 70–99)
Glucose-Capillary: 311 mg/dL — ABNORMAL HIGH (ref 70–99)
Glucose-Capillary: 192 mg/dL — ABNORMAL HIGH (ref 70–99)
Glucose-Capillary: 194 mg/dL — ABNORMAL HIGH (ref 70–99)

## 2023-09-28 LAB — MAGNESIUM: Magnesium: 2 mg/dL (ref 1.7–2.4)

## 2023-09-28 MED ORDER — HYDROCODONE-ACETAMINOPHEN 5-325 MG PO TABS: 1.0000 | ORAL_TABLET | Freq: Four times a day (QID) | ORAL | 0 refills | Status: DC | PRN

## 2023-09-28 MED ORDER — ENSURE PLUS HIGH PROTEIN PO LIQD
237.0000 mL | Freq: Two times a day (BID) | ORAL | Status: DC
  Administered 2023-09-28 – 2023-10-02 (×9): 237 mL via ORAL

## 2023-09-28 MED ORDER — APIXABAN 5 MG PO TABS
5.0000 mg | ORAL_TABLET | Freq: Two times a day (BID) | ORAL | Status: DC
  Administered 2023-09-28 – 2023-10-01 (×8): 5 mg via ORAL
  Filled 2023-09-28 (×8): qty 1

## 2023-09-28 MED ORDER — ADULT MULTIVITAMIN W/MINERALS CH
1.0000 | ORAL_TABLET | Freq: Every day | ORAL | Status: DC
  Administered 2023-09-28 – 2023-10-04 (×7): 1 via ORAL
  Filled 2023-09-28 (×7): qty 1

## 2023-09-28 MED ORDER — METHOCARBAMOL 500 MG PO TABS: 500.0000 mg | ORAL_TABLET | Freq: Four times a day (QID) | ORAL | 0 refills | Status: DC | PRN

## 2023-09-28 MED ORDER — METHOCARBAMOL 500 MG PO TABS
500.0000 mg | ORAL_TABLET | Freq: Four times a day (QID) | ORAL | Status: DC | PRN
  Administered 2023-09-28 – 2023-10-03 (×10): 500 mg via ORAL
  Filled 2023-09-28 (×10): qty 1

## 2023-09-28 MED ORDER — ACETAMINOPHEN 325 MG PO TABS
325.0000 mg | ORAL_TABLET | Freq: Four times a day (QID) | ORAL | Status: DC | PRN
Start: 2023-09-28 — End: 2023-10-04
  Administered 2023-09-29 – 2023-10-02 (×3): 650 mg via ORAL
  Filled 2023-09-28 (×3): qty 2

## 2023-09-28 NOTE — Progress Notes (Signed)
 Initial Nutrition Assessment  DOCUMENTATION CODES:   Not applicable  INTERVENTION:  Encourage PO intake - On regular diet with thin liquids Emphasis on protein intake for post op healing Ensure Plus High Protein po BID, each supplement provides 350 kcal and 20 grams of protein (*If intake is adequate may switch to Glucerna) MVI with minerals daily   NUTRITION DIAGNOSIS:   Increased nutrient needs related to post-op healing as evidenced by estimated needs.   GOAL:   Patient will meet greater than or equal to 90% of their needs   MONITOR:   PO intake, Supplement acceptance  REASON FOR ASSESSMENT:   Consult Hip fracture protocol  ASSESSMENT:   63 y.o. male with medical history significant of CAD, ischemic cardiomyopathy, prior CVA, HTN, HLD, DM 2,CHF with an ICD implant comes into the hospital after ground-level fall. Found to have right hip fracture s/p fracture fixation with intramedullary rod 9/26.  9/26 - s/p  fracture fixation with intramedullary rod on right hip    RD working remotely, attempted to call pt's room with no response. History obtained through chart review which was limited.   Pt was able to do all his ADLs before his fall. Unknown how to pt was eating or how he was receiving meals PTA. Weight history appears stable. Reviewed home med list and pt has been taking a GLP-1, Mounjaro , started on 07/08/2023 of this year.  Weight within the last year ranges from 165-170 lbs. Exam deferred to follow up. Pt POD 1 from surgery, has increased energy and protein needs related to post op healing. Add ONS and High Calorie, High Protein handout to AVS.   Admit weight: 76 kg Current weight: 74.8 kg   Average Meal Intake: No meals recorded   Nutritionally Relevant Medications: Scheduled Meds:  feeding supplement  237 mL Oral BID BM   insulin  aspart  0-5 Units Subcutaneous QHS   insulin  aspart  0-9 Units Subcutaneous TID WC   insulin  glargine  20 Units Subcutaneous  Daily   multivitamin with minerals  1 tablet Oral Daily   Continuous Infusions:   ceFAZolin  (ANCEF ) IV 2 g (09/28/23 0414)   Labs Reviewed: BUN 24 Creatinine 1.36 GFR 59  CBG ranges from 92-155 mg/dL over the last 24 hours HgbA1c 8.1  NUTRITION - FOCUSED PHYSICAL EXAM: - Deferred to follow up   Diet Order:   Diet Order             Diet regular Room service appropriate? Yes; Fluid consistency: Thin  Diet effective now                   EDUCATION NEEDS:   Not appropriate for education at this time  Skin:  Skin Assessment: Skin Integrity Issues: Skin Integrity Issues:: Incisions Incisions: Closed surgical incision right hip  Last BM:  PTA  Height:   Ht Readings from Last 1 Encounters:  09/27/23 5' 7 (1.702 m)    Weight:   Wt Readings from Last 1 Encounters:  09/27/23 74.8 kg    Ideal Body Weight:  67.3 kg  BMI:  Body mass index is 25.84 kg/m.  Estimated Nutritional Needs:   Kcal:  2000-2200 kcal  Protein:  100-120 gm  Fluid:  >2L/day   Olivia Kenning, RD Registered Dietitian  See Amion for more information

## 2023-09-28 NOTE — Progress Notes (Signed)

## 2023-09-28 NOTE — Progress Notes (Cosign Needed Addendum)
 Orthopaedic Trauma Progress Note  SUBJECTIVE: Patient doing okay this morning, just finished eating breakfast.  Notes pain in the right hip as expected.  Pain medications helping some.  Denies any numbness or tingling throughout the right lower extremity.  No chest pain. No SOB. No nausea/vomiting. No other complaints.  Has not been up out of bed yet since surgery.  Is eager to work with therapies so that he can return home.  Patient lives at home with his wife.  OBJECTIVE:  Vitals:   09/28/23 0400 09/28/23 0831  BP: 103/73 96/69  Pulse: (!) 105 100  Resp: 16 16  Temp: 98.2 F (36.8 C) 97.7 F (36.5 C)  SpO2: 97% 98%    Opiates Today (MME): Today's  total administered Morphine  Milligram Equivalents: 10 Opiates Yesterday (MME): Yesterday's total administered Morphine  Milligram Equivalents: 118  General: Sitting up in bed, no acute distress.  Pleasant and cooperative Respiratory: No increased work of breathing.  Operative Extremity (right lower extremity): Dressings clean, dry, intact.  Tenderness over the hip and throughout the proximal thigh as expected.  Less tender to the distal thigh.  Nontender through the calf, ankle, foot.  Ankle DF/PF intact.  Endorses sensation light touch over all aspects of the foot.  Compartment soft and compressible. + DP pulse  IMAGING: Stable intraoperative imaging right hip  LABS:  Results for orders placed or performed during the hospital encounter of 09/27/23 (from the past 24 hours)  Surgical pcr screen     Status: Abnormal   Collection Time: 09/27/23  9:56 AM   Specimen: Nasal Mucosa; Nasal Swab  Result Value Ref Range   MRSA, PCR NEGATIVE NEGATIVE   Staphylococcus aureus POSITIVE (A) NEGATIVE  HIV Antibody (routine testing w rflx)     Status: None   Collection Time: 09/27/23 11:28 AM  Result Value Ref Range   HIV Screen 4th Generation wRfx Non Reactive Non Reactive  CBG monitoring, ED     Status: None   Collection Time: 09/27/23  1:33 PM   Result Value Ref Range   Glucose-Capillary 97 70 - 99 mg/dL  Glucose, capillary     Status: None   Collection Time: 09/27/23  3:29 PM  Result Value Ref Range   Glucose-Capillary 92 70 - 99 mg/dL   Comment 1 Notify RN    Comment 2 Document in Chart   Glucose, capillary     Status: Abnormal   Collection Time: 09/27/23  4:58 PM  Result Value Ref Range   Glucose-Capillary 131 (H) 70 - 99 mg/dL  Glucose, capillary     Status: Abnormal   Collection Time: 09/27/23  8:57 PM  Result Value Ref Range   Glucose-Capillary 155 (H) 70 - 99 mg/dL  Comprehensive metabolic panel with GFR     Status: Abnormal   Collection Time: 09/28/23  5:57 AM  Result Value Ref Range   Sodium 136 135 - 145 mmol/L   Potassium 3.9 3.5 - 5.1 mmol/L   Chloride 102 98 - 111 mmol/L   CO2 21 (L) 22 - 32 mmol/L   Glucose, Bld 165 (H) 70 - 99 mg/dL   BUN 24 (H) 8 - 23 mg/dL   Creatinine, Ser 8.63 (H) 0.61 - 1.24 mg/dL   Calcium  8.6 (L) 8.9 - 10.3 mg/dL   Total Protein 6.0 (L) 6.5 - 8.1 g/dL   Albumin 3.4 (L) 3.5 - 5.0 g/dL   AST 23 15 - 41 U/L   ALT 19 0 - 44 U/L  Alkaline Phosphatase 54 38 - 126 U/L   Total Bilirubin 0.9 0.0 - 1.2 mg/dL   GFR, Estimated 59 (L) >60 mL/min   Anion gap 13 5 - 15  CBC     Status: Abnormal   Collection Time: 09/28/23  5:57 AM  Result Value Ref Range   WBC 14.5 (H) 4.0 - 10.5 K/uL   RBC 4.71 4.22 - 5.81 MIL/uL   Hemoglobin 14.8 13.0 - 17.0 g/dL   HCT 56.7 60.9 - 47.9 %   MCV 91.7 80.0 - 100.0 fL   MCH 31.4 26.0 - 34.0 pg   MCHC 34.3 30.0 - 36.0 g/dL   RDW 87.0 88.4 - 84.4 %   Platelets 327 150 - 400 K/uL   nRBC 0.0 0.0 - 0.2 %  Magnesium     Status: None   Collection Time: 09/28/23  5:57 AM  Result Value Ref Range   Magnesium 2.0 1.7 - 2.4 mg/dL  Glucose, capillary     Status: Abnormal   Collection Time: 09/28/23  6:40 AM  Result Value Ref Range   Glucose-Capillary 151 (H) 70 - 99 mg/dL    ASSESSMENT: John [name] is a 63 y.o. male, 1 Day Post-Op s/p  fall Procedures: FIXATION, FRACTURE, INTERTROCHANTERIC, WITH INTRAMEDULLARY ROD  CV/Blood loss: Hemoglobin 14.8 this morning.  Stable.  Hemodynamically stable  PLAN: Weightbearing: WBAT RLE ROM: Unrestricted ROM Incisional and dressing care: Dressings left intact until follow-up.  Change as needed if soiled or saturated Showering: Avoid getting dressings wet Orthopedic device(s): None  Pain management: Continue current regimen VTE prophylaxis: Resume home dose Eliquis , SCDs ID:  Ancef  2gm post op Foley/Lines:  No foley, KVO IVFs Dispo: PT/OT evaluation today.  Ortho issues stable.  Okay for discharge from ortho standpoint once cleared by medicine team and therapies  D/C recommendations: - Norco, Tylenol , Robaxin for pain control - Eliquis  for DVT prophylaxis   Follow - up plan: 2 weeks after d/c with Dr. Reyne for wound check and repeat x-rays   Contact information:  Franky Light MD, Lauraine Moores PA-C. After hours and holidays please check [email or web address] for group call information for [facility]   Lauraine PATRIC Moores, PA-C [phone] (office) [email or web address]

## 2023-09-28 NOTE — Evaluation (Signed)
 Physical Therapy Evaluation Patient Details Name: John [name] MRN: [id number] DOB: 1960-11-18 Today's Date: 09/28/2023  History of Present Illness  John [name] is a 63 y.o. male admitted 09/27/23 after ground-level fall sustaining right displaced intertrochanteric hip fracture. Pt s/p IM rod 9/26. PMHx: CAD, ischemic cardiomyopathy, CVA, HTN, HLD, T2DM,  arthritis, and chronic systolic CHF with an ICD implant.   Clinical Impression  Pt admitted with above diagnosis. PTA, pt was independent to modI for functional mobility occasionally using a SPC, independent with ADLs/IADLs, driving, and working. He lives in a one story house with 3 STE or a level entry if he goes around back. Pt currently with functional limitations due to the deficits listed below (see PT Problem List). He required modA for sit<>stand using RW and minA for ~56ft of forwards/backwards steps using RW. Pt took increased time to complete all mobility and required assist to manage RLE. He is currently limited by pain and decreased activity tolerance. Educated pt on RLE HEP and provided him with handout. Encouraged frequent mobilization with staff assistance. Pt will benefit from acute skilled PT to increase his independence and safety with mobility to allow discharge. Recommend intensive inpatient follow-up therapy, >3 hours/day.    If plan is discharge home, recommend the following: Two people to help with walking and/or transfers;A lot of help with bathing/dressing/bathroom;Assistance with cooking/housework;Assist for transportation;Help with stairs or ramp for entrance   Can travel by private vehicle        Equipment Recommendations None recommended by PT (Pt already has DME)  Recommendations for Other Services  Rehab consult    Functional Status Assessment Patient has had a recent decline in their functional status and demonstrates the ability to make significant improvements in function in a reasonable and  predictable amount of time.     Precautions / Restrictions Precautions Precautions: Fall Recall of Precautions/Restrictions: Impaired Restrictions Weight Bearing Restrictions Per Provider Order: Yes RLE Weight Bearing Per Provider Order: Weight bearing as tolerated Other Position/Activity Restrictions: Unrestricted RLE ROM      Mobility  Bed Mobility               General bed mobility comments: Not assessed. Pt greeted seated in recliner chair and returned there at end of session.    Transfers Overall transfer level: Needs assistance Equipment used: Rolling walker (2 wheels) Transfers: Sit to/from Stand Sit to Stand: Mod assist           General transfer comment: Pt stood from recliner chair. Instructed pt to scoot fwd to edge of surface, increase bilat knee flex, increase fwd lean, and utilized momentum to power  up. Cued proper hand placement using RW. Pt opted to maintain BUE support on arm rest. He stood with modA. Pt took increased time to transition BUE support onto RW grips and maintained a fwd lean. VC/TC to improve upright posture. Pt maintained RLE in knee flex, cued extension and pt was able to rest with foot flat with increased time. Fair eccentric control, reaching back to arm rest prior to sitting. Pt required PT to hold RLE when transitioning between legs elevated and down in recliner chair.    Ambulation/Gait Ambulation/Gait assistance: Min assist Gait Distance (Feet): 2 Feet (x2 (forwards and backwards)) Assistive device: Rolling walker (2 wheels) Gait Pattern/deviations: Step-to pattern, Decreased step length - right, Decreased step length - left, Decreased stance time - right, Decreased weight shift to right, Knee flexed in stance - right, Antalgic, Trunk flexed Gait  velocity: decr     General Gait Details: Pt took short slow labored steps with heavy reliance on BUE support on RW. Pt unable to clear R foot from ground, toes dragging. Assist by PT to  advance RLE forward through post-ant support at pt's heel. He self-limited WBing on RLE d/t pain. Cues for sequencing. Assist to manuever RW for safety/stability. He maintained RLE in knee flex and held infront of him instead of beneath his trunk. Unsteady,  but no LOB.  Stairs            Wheelchair Mobility     Tilt Bed    Modified Rankin (Stroke Patients Only)       Balance Overall balance assessment: Needs assistance Sitting-balance support: Bilateral upper extremity supported, Feet supported Sitting balance-Leahy Scale: Fair Sitting balance - Comments: Pt sat in recliner chair he utilized BUE support to scoot fwd/bkwd with PT managing RLE during elevation/lower of leg rest.   Standing balance support: Bilateral upper extremity supported, During functional activity, Reliant on assistive device for balance Standing balance-Leahy Scale: Poor Standing balance comment: Pt dependent on RW and external support of PT                             Pertinent Vitals/Pain Pain Assessment Pain Assessment: 0-10 Pain Score: 10-Worst pain ever Pain Location: R hip and knee Pain Descriptors / Indicators: Operative site guarding, Grimacing, Moaning, Aching, Discomfort, Sharp Pain Intervention(s): Patient requesting pain meds-RN notified, Limited activity within patient's tolerance, RN gave pain meds during session, Monitored during session    Home Living Family/patient expects to be discharged to:: Private residence Living Arrangements: Spouse/significant other Available Help at Discharge: Family;Available PRN/intermittently (Spouse is off work next week, after that he will be alone during the day/evening up to 6pm) Type of Home: House Home Access: Stairs to enter;Level entry Entrance Stairs-Rails: None Entrance Stairs-Number of Steps: 3 (can walk around back to where no steps are at)   Home Layout: One level Home Equipment: Agricultural consultant (2 wheels);Cane - single  point;Cane - quad;Rollator (4 wheels);Shower seat;Wheelchair - manual;BSC/3in1      Prior Function Prior Level of Function : Independent/Modified Independent;Driving;Working/employed             Mobility Comments: Ambulates without AD mainly. He occasionally used a SPC when his knees were bothering him. 1 fall leading to current admission d/t trying to remove a pallet jack underneath a pallet and it gave out and threw him backwards. ADLs Comments: Indep with ADLs/IADLs. Driving. 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     Extremity/Trunk Assessment   Upper Extremity Assessment Upper Extremity Assessment: Defer to OT evaluation    Lower Extremity Assessment Lower Extremity Assessment: RLE deficits/detail RLE Deficits / Details: Pt POD 1 s/p L hip IM rod. Limited hip and knee AROM, decreased ankle AROM. Pt guarded against PROM attempts. RLE: Unable to fully assess due to pain RLE Sensation: WNL RLE Coordination: decreased gross motor    Cervical / Trunk Assessment Cervical / Trunk Assessment: Normal  Communication   Communication Communication: Impaired Factors Affecting Communication: Hearing impaired    Cognition Arousal: Alert Behavior During Therapy: WFL for tasks assessed/performed   PT - Cognitive impairments: No apparent impairments                       PT - Cognition Comments: Pt A,Ox4 Following commands: Intact  Cueing Cueing Techniques: Verbal cues     General Comments General comments (skin integrity, edema, etc.): Pt with RLE edema at hip/knee.    Exercises Other Exercises Other Exercises: Educated pt on R hip HEP. Demonstrated each exercise and reviewed technique. Provided pt with handout.   Assessment/Plan    PT Assessment Patient needs continued PT services  PT Problem List Decreased strength;Decreased range of motion;Decreased activity tolerance;Decreased balance;Decreased mobility;Decreased  coordination;Decreased knowledge of use of DME;Decreased safety awareness;Decreased knowledge of precautions;Pain       PT Treatment Interventions DME instruction;Gait training;Stair training;Functional mobility training;Therapeutic activities;Therapeutic exercise;Balance training;Patient/family education    PT Goals (Current goals can be found in the Care Plan section)  Acute Rehab PT Goals Patient Stated Goal: Have less pain, regain independence, and return to work PT Goal Formulation: With patient Time For Goal Achievement: 10/12/23 Potential to Achieve Goals: Good    Frequency Min 3X/week     Co-evaluation               AM-PAC PT 6 Clicks Mobility  Outcome Measure Help needed turning from your back to your side while in a flat bed without using bedrails?: A Lot Help needed moving from lying on your back to sitting on the side of a flat bed without using bedrails?: A Lot Help needed moving to and from a bed to a chair (including a wheelchair)?: A Lot Help needed standing up from a chair using your arms (e.g., wheelchair or bedside chair)?: A Lot Help needed to walk in hospital room?: A Lot Help needed climbing 3-5 steps with a railing? : Total 6 Click Score: 11    End of Session Equipment Utilized During Treatment: Gait belt Activity Tolerance: Patient limited by pain Patient left: in chair;with call bell/phone within reach Nurse Communication: Mobility status;Need for lift equipment (stedy) PT Visit Diagnosis: Pain;Difficulty in walking, not elsewhere classified (R26.2);Other abnormalities of gait and mobility (R26.89);Unsteadiness on feet (R26.81) Pain - Right/Left: Right Pain - part of body: Hip;Knee    Time: 8770-8743 PT Time Calculation (min) (ACUTE ONLY): 27 min   Charges:   PT Evaluation $PT Eval Moderate Complexity: 1 Mod PT Treatments $Therapeutic Activity: 8-22 mins PT General Charges $$ ACUTE PT VISIT: 1 Visit         Randall SAUNDERS, PT, DPT Acute  Rehabilitation Services Office: [phone] Secure Chat Preferred  John [name] 09/28/2023, 1:18 PM

## 2023-09-28 NOTE — Evaluation (Signed)
 Occupational Therapy Evaluation Patient Details Name: John [name] MRN: [id number] DOB: Mar 20, 1960 Today's Date: 09/28/2023   History of Present Illness   John [name] is a 63 y.o. male admitted 09/27/23 after ground-level fall sustaining right displaced intertrochanteric hip fracture. Pt s/p IM rod 9/26. PMHx: CAD, ischemic cardiomyopathy, CVA, HTN, HLD, T2DM,  arthritis, and chronic systolic CHF with an ICD implant.     Clinical Impressions Pt currently at mod to max assist for selfcare tasks sit to stand with mod assist for sit to stand and short distance mobility from bed to the recliner.  Increased pain in the RLE with weightbearing and pt maintaining flexed knee with decreased ability to advance the LE.  Pt was independent and working prior to this event.  He lives with his spouse who just recently had knee surgery per his daughter's report and can assist some but is limited.  Feel he will benefit from acute care OT at this time to help progress ADL independence and transfers through strengthening and compensatory strategies.  Feel he will benefit from post acute  intensive inpatient follow-up therapy, >3 hours/day in order to reach a level that is safe for his spouse to assist with daughter coming in some as well.       If plan is discharge home, recommend the following:   A lot of help with walking and/or transfers;A lot of help with bathing/dressing/bathroom;Direct supervision/assist for financial management;Direct supervision/assist for medications management     Functional Status Assessment   Patient has had a recent decline in their functional status and demonstrates the ability to make significant improvements in function in a reasonable and predictable amount of time.     Equipment Recommendations   Other (comment) (TBD)     Recommendations for Other Services   Rehab consult     Precautions/Restrictions   Precautions Precautions: Fall Recall of  Precautions/Restrictions: Impaired Restrictions Weight Bearing Restrictions Per Provider Order: No RLE Weight Bearing Per Provider Order: Weight bearing as tolerated     Mobility Bed Mobility Overal bed mobility: Needs Assistance Bed Mobility: Supine to Sit     Supine to sit: Max assist     General bed mobility comments: Assist with advancing RLE off of the bed and then bringing trunk up.    Transfers Overall transfer level: Needs assistance Equipment used: Rolling walker (2 wheels) Transfers: Sit to/from Stand, Bed to chair/wheelchair/BSC Sit to Stand: Mod assist, From elevated surface     Step pivot transfers: Mod assist     General transfer comment: Pt with mod demonstrational cueing needed for sequencing walker and not placing forearms on it or holding between his first and second fingers.  Mod assist initially to advance the RLE but then he was able to complete slowly without assist, short step length.      Balance Overall balance assessment: Needs assistance Sitting-balance support: Bilateral upper extremity supported, Feet supported Sitting balance-Leahy Scale: Poor Sitting balance - Comments: Increased right lean in sitting, needing UE support to maintain   Standing balance support: Bilateral upper extremity supported, During functional activity, Reliant on assistive device for balance Standing balance-Leahy Scale: Poor Standing balance comment: Mod assist for mobility with reliance on RW for support to take steps.                           ADL either performed or assessed with clinical judgement   ADL Overall ADL's : Needs assistance/impaired  Eating/Feeding: Independent;Sitting   Grooming: Wash/dry hands;Wash/dry face;Set up;Sitting Grooming Details (indicate cue type and reason): simulated Upper Body Bathing: Set up;Sitting Upper Body Bathing Details (indicate cue type and reason): simulated Lower Body Bathing: Maximal assistance;Sit to/from  stand   Upper Body Dressing : Set up;Sitting   Lower Body Dressing: Maximal assistance;Sit to/from stand   Toilet Transfer: Moderate assistance;Stand-pivot;BSC/3in1;Rolling walker (2 wheels)   Toileting- Clothing Manipulation and Hygiene: Moderate assistance;Sit to/from stand       Functional mobility during ADLs: Moderate assistance;Rolling walker (2 wheels);Cueing for sequencing (ambulate short distance from bed to chair) General ADL Comments: Pt with decreased ability to transition from supine to sit needing max assist to complete. Increased left lean in sitting secondary to pain in the right hip.  Pt's daughter reports that the spouse recently had knee surgery and cannot provide a lot of physical assist but some.  Daughter can also come in and help some when she is not working.  Increased right knee flexion noted in standing with decreased efficiency with advancing the LLE.     Vision Baseline Vision/History: 0 No visual deficits Ability to See in Adequate Light: 0 Adequate Patient Visual Report: No change from baseline Vision Assessment?: No apparent visual deficits     Perception Perception: Not tested       Praxis Praxis: Not tested       Pertinent Vitals/Pain Pain Assessment Pain Assessment: 0-10 Pain Score: 6  Pain Location: R hip and knee Pain Descriptors / Indicators: Discomfort, Grimacing Pain Intervention(s): Limited activity within patient's tolerance, Monitored during session, Repositioned     Extremity/Trunk Assessment Upper Extremity Assessment Upper Extremity Assessment: Overall WFL for tasks assessed   Lower Extremity Assessment Lower Extremity Assessment: Defer to PT evaluation RLE Deficits / Details: Pt POD 1 s/p L hip IM rod. Limited hip and knee AROM, decreased ankle AROM. Pt guarded against PROM attempts. RLE: Unable to fully assess due to pain RLE Sensation: WNL RLE Coordination: decreased gross motor   Cervical / Trunk Assessment Cervical /  Trunk Assessment: Normal   Communication Communication Communication: Impaired Factors Affecting Communication: Hearing impaired   Cognition Arousal: Alert Behavior During Therapy: WFL for tasks assessed/performed Cognition: No apparent impairments                               Following commands: Intact       Cueing  General Comments   Cueing Techniques: Verbal cues  Pt with RLE edema at hip/knee.           Home Living Family/patient expects to be discharged to:: Private residence Living Arrangements: Spouse/significant other Available Help at Discharge: Family;Available PRN/intermittently (Spouse is off work next week, after that he will be alone during the day/evening up to 6pm) Type of Home: House Home Access: Stairs to enter;Level entry Entrance Stairs-Number of Steps: 3 (can walk around back to where no steps are at) Entrance Stairs-Rails: None Home Layout: One level     Bathroom Shower/Tub: Producer, television/film/video: Standard     Home Equipment: Agricultural consultant (2 wheels);Cane - single point;Cane - quad;Rollator (4 wheels);Shower seat;Wheelchair - manual;BSC/3in1          Prior Functioning/Environment Prior Level of Function : Independent/Modified Independent;Driving;Working/employed             Mobility Comments: Ambulates without AD mainly. He occasionally used a SPC when his knees were bothering him. 1 fall leading to  current admission d/t trying to remove a pallet jack underneath a pallet and it gave out and threw him backwards. ADLs Comments: Indep with ADLs/IADLs. Driving. 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    OT Problem List: Decreased strength;Decreased knowledge of use of DME or AE;Decreased activity tolerance;Impaired balance (sitting and/or standing);Pain   OT Treatment/Interventions: Self-care/ADL training;Patient/family education;Balance training;Therapeutic activities;DME and/or AE  instruction      OT Goals(Current goals can be found in the care plan section)       OT Frequency:  Min 2X/week       AM-PAC OT 6 Clicks Daily Activity     Outcome Measure Help from another person eating meals?: None Help from another person taking care of personal grooming?: A Little Help from another person toileting, which includes using toliet, bedpan, or urinal?: A Lot Help from another person bathing (including washing, rinsing, drying)?: A Lot Help from another person to put on and taking off regular upper body clothing?: A Little Help from another person to put on and taking off regular lower body clothing?: A Lot 6 Click Score: 16   End of Session Equipment Utilized During Treatment: Gait belt;Rolling walker (2 wheels) Nurse Communication: Mobility status  Activity Tolerance: Patient limited by pain Patient left: in chair;with call bell/phone within reach;with family/visitor present  OT Visit Diagnosis: Unsteadiness on feet (R26.81);Other abnormalities of gait and mobility (R26.89);Muscle weakness (generalized) (M62.81);Pain Pain - Right/Left: Right Pain - part of body: Hip                Time: 8985-8942 OT Time Calculation (min): 43 min Charges:  OT General Charges $OT Visit: 1 Visit OT Evaluation $OT Eval Moderate Complexity: 1 Mod OT Treatments $Self Care/Home Management : 23-37 mins  Lynwood Constant, OTR/L Acute Rehabilitation Services  Office [phone] 09/28/2023

## 2023-09-28 NOTE — Plan of Care (Signed)
  Problem: Education: Goal: Knowledge of General Education information will improve Description: Including pain rating scale, medication(s)/side effects and non-pharmacologic comfort measures Outcome: Progressing   Problem: Nutrition: Goal: Adequate nutrition will be maintained Outcome: Progressing   Problem: Pain Managment: Goal: General experience of comfort will improve and/or be controlled Outcome: Progressing   Problem: Nutritional: Goal: Maintenance of adequate nutrition will improve Outcome: Progressing

## 2023-09-28 NOTE — Progress Notes (Signed)
 PROGRESS NOTE  John [name] FMW:[id number] DOB: December 23, 1960 DOA: 09/27/2023 PCP: Berneta Elsie Sayre, MD   LOS: 1 day   Brief Narrative / Interim history: 63 y.o. male with medical history significant of CAD, ischemic cardiomyopathy managed medically, prior CVA, HTN, HLD, DM 2, chronic systolic CHF with an ICD implant comes into the hospital after ground-level fall.  He works nights, at [employer], was pulling on a heavy object then lost grip and balance and landed on his back. Following that, he experienced right hip pain, was unable to walk and came to the hospital.  He was found to have a hip fracture, orthopedic surgery was consulted and he was admitted to the hospitalist service.  Subjective / 24h Interval events: He is doing well today, had surgery yesterday, no nausea or vomiting last night, he seems to be in moderate pain in the hip this morning.  Assesement and Plan: Principal problem Right hip fracture -orthopedic surgery consulted, appreciate input.  Patient was taken to the OR 9/26 by Dr. Reyne status post intertrochanteric fixation with IM rod. - Resume Eliquis  when okay with Ortho   Active problems CAD, ischemic cardiomyopathy -prior to his fall he has no ischemic symptoms, no angina, able to do all his ADLs work at full capacity without any issues.   - Remains stable today, euvolemic, no chest pain or discomfort.  PT evaluation pending   Prior CVA-no residual deficits, able to work/do all his ADLs   Chronic systolic CHF -most recent 2D echo March 2023 LVEF 25-30%, no WMA, grade 1 diastolic dysfunction, RV size and systolic function were normal.  Continue Coreg  at a lower dose, continue spironolactone , hold Entresto  still due to soft blood pressure postoperatively   HTN -hold Entresto  perioperatively, continues to have soft blood pressures this morning   HLD -continue home medications   CKD 3 A -baseline creatinine 1.1-1.4, and at baseline today   DM 2,  poorly controlled-placed on glargine/sliding scale  CBG (last 3)  Recent Labs    09/27/23 1658 09/27/23 2057 09/28/23 0640  GLUCAP 131* 155* 151*   Lab Results  Component Value Date   HGBA1C 8.1 (A) 07/08/2023    Scheduled Meds:  carvedilol   6.25 mg Oral BID WC   dapagliflozin  propanediol  10 mg Oral Daily   docusate sodium   100 mg Oral BID   ezetimibe   10 mg Oral Daily   feeding supplement  237 mL Oral BID BM   insulin  aspart  0-5 Units Subcutaneous QHS   insulin  aspart  0-9 Units Subcutaneous TID WC   insulin  glargine  20 Units Subcutaneous Daily   multivitamin with minerals  1 tablet Oral Daily   spironolactone   12.5 mg Oral Daily   Continuous Infusions: PRN Meds:.bisacodyl , HYDROcodone -acetaminophen , morphine  injection, ondansetron  **OR** ondansetron  (ZOFRAN ) IV, polyethylene glycol, zolpidem   Current Outpatient Medications  Medication Instructions   acetaminophen  (TYLENOL ) 500 mg, Oral, Every 6 hours PRN   Aspirin -Caffeine (BC FAST PAIN RELIEF PO) 1 Package, Daily PRN   carvedilol  (COREG ) 12.5 MG tablet TAKE ONE TABLET BY MOUTH TWICE A DAY WITH A MEAL   chlorhexidine  (HIBICLENS ) 4 % external liquid 1 Application, Topical, As directed, Use as directed daily for 5 days every other week for 6 weeks.   dapagliflozin  propanediol (FARXIGA ) 10 mg, Oral, Daily   diclofenac  Sodium (VOLTAREN ) 1 % GEL Rub a small grape sized dollop into knee up to 3 times daily as needed.   diphenhydrAMINE HCl, Sleep, (ZZZQUIL PO) 1 Dose,  At bedtime PRN   Eliquis  5 mg, Oral, 2 times daily   Evolocumab  (REPATHA  SURECLICK) 140 MG/ML SOAJ INJECT 1 MILLILITER UNDER THE SKIN EVERY 14 DAYS   ezetimibe  (ZETIA ) 10 mg, Oral, Daily   glipiZIDE  (GLUCOTROL  XL) 10 mg, Oral, Daily with breakfast   Lantus  SoloStar 45 Units, Subcutaneous, BH-each morning, And pen needles 1/day   mupirocin  ointment (BACTROBAN ) 2 % 1 Application, Nasal, 2 times daily, Use as directed 2 times daily for 5 days every other week for  6 weeks.   sacubitril-valsartan (ENTRESTO ) 24-26 MG 1 tablet, Oral, 2 times daily   sildenafil  (VIAGRA ) 50-100 mg, Oral, Daily PRN   spironolactone  (ALDACTONE ) 12.5 mg, Oral, Daily   tirzepatide  (MOUNJARO ) 12.5 mg, Subcutaneous, Weekly    Diet Orders (From admission, onward)     Start     Ordered   09/27/23 1734  Diet regular Room service appropriate? Yes; Fluid consistency: Thin  Diet effective now       Question Answer Comment  Room service appropriate? Yes   Fluid consistency: Thin      09/27/23 1733            DVT prophylaxis: SCDs Start: 09/27/23 1734   Lab Results  Component Value Date   PLT 327 09/28/2023      Code Status: Full Code  Family Communication: No family at bedside  Status is: Inpatient Remains inpatient appropriate because: Severity of illness   Level of care: Telemetry Medical  Consultants:  Orthopedic surgery  Objective: Vitals:   09/27/23 1738 09/27/23 1951 09/28/23 0400 09/28/23 0831  BP: 113/81 106/83 103/73 96/69  Pulse: 95 (!) 102 (!) 105 100  Resp: 16  16 16   Temp: 98.1 F (36.7 C) 98.2 F (36.8 C) 98.2 F (36.8 C) 97.7 F (36.5 C)  TempSrc: Oral Oral Oral   SpO2: 100% 100% 97% 98%  Weight:      Height:        Intake/Output Summary (Last 24 hours) at 09/28/2023 9076 Last data filed at 09/28/2023 0700 Gross per 24 hour  Intake 211.83 ml  Output 1200 ml  Net -988.17 ml   Wt Readings from Last 3 Encounters:  09/27/23 74.8 kg  07/08/23 76.4 kg  05/13/23 77.3 kg    Examination:  Constitutional: NAD Eyes: no scleral icterus ENMT: Mucous membranes are moist.  Neck: normal, supple Respiratory: clear to auscultation bilaterally, no wheezing, no crackles.  Cardiovascular: Regular rate and rhythm, no murmurs / rubs / gallops. No LE edema.  Abdomen: non distended, no tenderness. Bowel sounds positive.  Musculoskeletal: no clubbing / cyanosis.    Data Reviewed: I have independently reviewed following labs and imaging  studies   CBC Recent Labs  Lab 09/27/23 0604 09/28/23 0557  WBC 14.1* 14.5*  HGB 15.6 14.8  HCT 46.4 43.2  PLT 336 327  MCV 94.3 91.7  MCH 31.7 31.4  MCHC 33.6 34.3  RDW 12.7 12.9  LYMPHSABS 1.6  --   MONOABS 0.8  --   EOSABS 0.2  --   BASOSABS 0.1  --     Recent Labs  Lab 09/27/23 0604 09/28/23 0557  NA 137 136  K 4.5 3.9  CL 105 102  CO2 19* 21*  GLUCOSE 165* 165*  BUN 27* 24*  CREATININE 1.41* 1.36*  CALCIUM  8.8* 8.6*  AST  --  23  ALT  --  19  ALKPHOS  --  54  BILITOT  --  0.9  ALBUMIN  --  3.4*  MG  --  2.0  INR 1.1  --     ------------------------------------------------------------------------------------------------------------------ No results for input(s): CHOL, HDL, LDLCALC, TRIG, CHOLHDL, LDLDIRECT in the last 72 hours.  Lab Results  Component Value Date   HGBA1C 8.1 (A) 07/08/2023   ------------------------------------------------------------------------------------------------------------------ No results for input(s): TSH, T4TOTAL, T3FREE, THYROIDAB in the last 72 hours.  Invalid input(s): FREET3  Cardiac Enzymes No results for input(s): CKMB, TROPONINI, MYOGLOBIN in the last 168 hours.  Invalid input(s): CK ------------------------------------------------------------------------------------------------------------------    Component Value Date/Time   BNP 455.8 (H) 10/18/2020 0916    CBG: Recent Labs  Lab 09/27/23 1333 09/27/23 1529 09/27/23 1658 09/27/23 2057 09/28/23 0640  GLUCAP 97 92 131* 155* 151*    Recent Results (from the past 240 hours)  Surgical pcr screen     Status: Abnormal   Collection Time: 09/27/23  9:56 AM   Specimen: Nasal Mucosa; Nasal Swab  Result Value Ref Range Status   MRSA, PCR NEGATIVE NEGATIVE Final   Staphylococcus aureus POSITIVE (A) NEGATIVE Final    Comment: (NOTE) The Xpert SA Assay (FDA approved for NASAL specimens in patients 40 years of age and older), is  one component of a comprehensive surveillance program. It is not intended to diagnose infection nor to guide or monitor treatment. Performed at [facility], [address] [place], [place] [address]      Radiology Studies: DG FEMUR, MIN 2 VIEWS RIGHT Result Date: 09/27/2023 CLINICAL DATA:  Elective surgery for internal fixation of inter trochanteric fracture of the right hip EXAM: RIGHT FEMUR 2 VIEWS COMPARISON:  09/27/2023 FINDINGS: Intraoperative fluoroscopy is obtained for surgical control purposes. Fluoroscopy time is recorded at 84.6 seconds. Dose 17.9 mGy. 5 spot fluoroscopic images are provided. Spot fluoroscopic images demonstrate internal fixation of an inter trochanteric fracture of the right hip using an intramedullary rod with femoral neck compression bold and distal locking screw. Near anatomic alignment of the fracture fragments is suggested. IMPRESSION: Intraoperative fluoroscopy is obtained for surgical control purposes during internal fixation of the inter trochanteric fracture of the proximal right femur. Electronically Signed   By: Elsie Gravely M.D.   On: 09/27/2023 19:51   DG C-Arm 1-60 Min-No Report Result Date: 09/27/2023 Fluoroscopy was utilized by the requesting physician.  No radiographic interpretation.   DG C-Arm 1-60 Min-No Report Result Date: 09/27/2023 Fluoroscopy was utilized by the requesting physician.  No radiographic interpretation.     Nilda Fendt, MD, PhD [facility]  Between 7 am - 7 pm I am available, please contact me via Amion (for emergencies) or Securechat (non urgent messages)  Between 7 pm - 7 am I am not available, please contact night coverage MD/APP via Amion

## 2023-09-29 LAB — GLUCOSE, CAPILLARY
Glucose-Capillary: 167 mg/dL — ABNORMAL HIGH (ref 70–99)
Glucose-Capillary: 165 mg/dL — ABNORMAL HIGH (ref 70–99)
Glucose-Capillary: 226 mg/dL — ABNORMAL HIGH (ref 70–99)
Glucose-Capillary: 109 mg/dL — ABNORMAL HIGH (ref 70–99)
Glucose-Capillary: 149 mg/dL — ABNORMAL HIGH (ref 70–99)

## 2023-09-29 LAB — CBC
HCT: 37.6 % — ABNORMAL LOW (ref 39.0–52.0)
Hemoglobin: 13.2 g/dL (ref 13.0–17.0)
MCH: 32.1 pg (ref 26.0–34.0)
MCHC: 35.1 g/dL (ref 30.0–36.0)
MCV: 91.5 fL (ref 80.0–100.0)
Platelets: 287 K/uL (ref 150–400)
RBC: 4.11 MIL/uL — ABNORMAL LOW (ref 4.22–5.81)
RDW: 12.7 % (ref 11.5–15.5)
WBC: 13.7 K/uL — ABNORMAL HIGH (ref 4.0–10.5)
nRBC: 0 % (ref 0.0–0.2)

## 2023-09-29 LAB — COMPREHENSIVE METABOLIC PANEL WITH GFR
ALT: 14 U/L (ref 0–44)
AST: 27 U/L (ref 15–41)
Albumin: 3.1 g/dL — ABNORMAL LOW (ref 3.5–5.0)
Alkaline Phosphatase: 48 U/L (ref 38–126)
Anion gap: 8 (ref 5–15)
BUN: 30 mg/dL — ABNORMAL HIGH (ref 8–23)
CO2: 24 mmol/L (ref 22–32)
Calcium: 8.5 mg/dL — ABNORMAL LOW (ref 8.9–10.3)
Chloride: 103 mmol/L (ref 98–111)
Creatinine, Ser: 1.35 mg/dL — ABNORMAL HIGH (ref 0.61–1.24)
GFR, Estimated: 59 mL/min — ABNORMAL LOW (ref >60)
Glucose, Bld: 155 mg/dL — ABNORMAL HIGH (ref 70–99)
Potassium: 4 mmol/L (ref 3.5–5.1)
Sodium: 135 mmol/L (ref 135–145)
Total Bilirubin: 0.8 mg/dL (ref 0.0–1.2)
Total Protein: 5.8 g/dL — ABNORMAL LOW (ref 6.5–8.1)

## 2023-09-29 LAB — MAGNESIUM: Magnesium: 2 mg/dL (ref 1.7–2.4)

## 2023-09-29 NOTE — Progress Notes (Signed)
 Orthopaedic Trauma Progress Note  SUBJECTIVE: Patient doing fair this morning.  Noted to have a lot of pain in the right hip when working with therapies yesterday.  It was more than he had expected. Pain medications helping some, is hopeful to time pain medication around therapy session today.  Denies any numbness or tingling throughout the right lower extremity.  No chest pain. No SOB. No nausea/vomiting. No other complaints.  Patient agreeable to CIR if he is a candidate.  OBJECTIVE:  Vitals:   09/28/23 1949 09/29/23 0430  BP: 110/76 126/76  Pulse: 100 99  Resp:  16  Temp: 98 F (36.7 C) 98.2 F (36.8 C)  SpO2: 97% 98%    Opiates Today (MME): Today's  total administered Morphine  Milligram Equivalents: 10 Opiates Yesterday (MME): Yesterday's total administered Morphine  Milligram Equivalents: 31.5  General: Sitting up in bed, no acute distress.  Pleasant and cooperative Respiratory: No increased work of breathing.  Operative Extremity (right lower extremity): Dressings clean, dry, intact.  Tenderness over the hip and throughout the proximal thigh as expected.  Less tender to the distal thigh.  Nontender through the calf, ankle, foot.  Ankle DF/PF intact.  Endorses sensation light touch over all aspects of the foot.  Compartment soft and compressible. + DP pulse  IMAGING: Stable intraoperative imaging right hip  LABS:  Results for orders placed or performed during the hospital encounter of 09/27/23 (from the past 24 hours)  Glucose, capillary     Status: Abnormal   Collection Time: 09/28/23 11:40 AM  Result Value Ref Range   Glucose-Capillary 311 (H) 70 - 99 mg/dL  Glucose, capillary     Status: Abnormal   Collection Time: 09/28/23  4:32 PM  Result Value Ref Range   Glucose-Capillary 192 (H) 70 - 99 mg/dL  Glucose, capillary     Status: Abnormal   Collection Time: 09/28/23  9:33 PM  Result Value Ref Range   Glucose-Capillary 194 (H) 70 - 99 mg/dL  Comprehensive metabolic panel  with GFR     Status: Abnormal   Collection Time: 09/29/23  5:39 AM  Result Value Ref Range   Sodium 135 135 - 145 mmol/L   Potassium 4.0 3.5 - 5.1 mmol/L   Chloride 103 98 - 111 mmol/L   CO2 24 22 - 32 mmol/L   Glucose, Bld 155 (H) 70 - 99 mg/dL   BUN 30 (H) 8 - 23 mg/dL   Creatinine, Ser 8.64 (H) 0.61 - 1.24 mg/dL   Calcium  8.5 (L) 8.9 - 10.3 mg/dL   Total Protein 5.8 (L) 6.5 - 8.1 g/dL   Albumin 3.1 (L) 3.5 - 5.0 g/dL   AST 27 15 - 41 U/L   ALT 14 0 - 44 U/L   Alkaline Phosphatase 48 38 - 126 U/L   Total Bilirubin 0.8 0.0 - 1.2 mg/dL   GFR, Estimated 59 (L) >60 mL/min   Anion gap 8 5 - 15  CBC     Status: Abnormal   Collection Time: 09/29/23  5:39 AM  Result Value Ref Range   WBC 13.7 (H) 4.0 - 10.5 K/uL   RBC 4.11 (L) 4.22 - 5.81 MIL/uL   Hemoglobin 13.2 13.0 - 17.0 g/dL   HCT 62.3 (L) 60.9 - 47.9 %   MCV 91.5 80.0 - 100.0 fL   MCH 32.1 26.0 - 34.0 pg   MCHC 35.1 30.0 - 36.0 g/dL   RDW 87.2 88.4 - 84.4 %   Platelets 287 150 - 400 K/uL  nRBC 0.0 0.0 - 0.2 %  Magnesium     Status: None   Collection Time: 09/29/23  5:39 AM  Result Value Ref Range   Magnesium 2.0 1.7 - 2.4 mg/dL  Glucose, capillary     Status: Abnormal   Collection Time: 09/29/23  6:15 AM  Result Value Ref Range   Glucose-Capillary 167 (H) 70 - 99 mg/dL    ASSESSMENT: John [name] is a 63 y.o. male, 2 Days Post-Op s/p fall Procedures: FIXATION, FRACTURE, INTERTROCHANTERIC, WITH INTRAMEDULLARY ROD  CV/Blood loss: Slight drop in hemoglobin, 13.2 this morning.  Overall stable.  Patient remains hemodynamically stable  PLAN: Weightbearing: WBAT RLE ROM: Unrestricted ROM Incisional and dressing care: Dressings left intact until follow-up.  Change as needed if soiled or saturated Showering: Avoid getting dressings wet Orthopedic device(s): None  Pain management: Continue current regimen VTE prophylaxis: home dose Eliquis , SCDs ID:  Ancef  2gm post op completed Foley/Lines:  No foley, KVO  IVFs Dispo: PT/OT evaluation ongoing, currently recommending CIR.  Admission coordinator has been following patient.  Ortho issues stable.  Okay for discharge from ortho standpoint once cleared by medicine team and therapies  D/C recommendations: - Norco, Tylenol , Robaxin for pain control - Eliquis  for DVT prophylaxis   Follow - up plan: 2 weeks after d/c with Dr. Reyne for wound check and repeat x-rays   Contact information:  Franky Light MD, Lauraine Moores PA-C. After hours and holidays please check [email or web address] for group call information for [facility]   Lauraine PATRIC Moores, PA-C [phone] (office) [email or web address]

## 2023-09-29 NOTE — Progress Notes (Signed)
 PROGRESS NOTE  LEAF KERNODLE FMW:[id number] DOB: 25-Jan-1960 DOA: 09/27/2023 PCP: Berneta Elsie Sayre, MD   LOS: 2 days   Brief Narrative / Interim history: 63 y.o. male with medical history significant of CAD, ischemic cardiomyopathy managed medically, prior CVA, HTN, HLD, DM 2, chronic systolic CHF with an ICD implant comes into the hospital after ground-level fall.  He works nights, at [employer], was pulling on a heavy object then lost grip and balance and landed on his back. Following that, he experienced right hip pain, was unable to walk and came to the hospital.  He was found to have a hip fracture, orthopedic surgery was consulted and he was admitted to the hospitalist service.  Subjective / 24h Interval events: Doing well today.  Worked with physical therapy yesterday, felt like it was hard.  He understands recommendations for rehab and he is agreeable.  Assesement and Plan: Principal problem Right hip fracture -orthopedic surgery consulted, appreciate input.  Patient was taken to the OR 9/26 by Dr. Reyne status post intertrochanteric fixation with IM rod. - Eliquis  has been resumed   Active problems CAD, ischemic cardiomyopathy -prior to his fall he has no ischemic symptoms, no angina, able to do all his ADLs work at full capacity without any issues.   - Remains stable today, euvolemic, no chest pain, no shortness of breath.   Prior CVA-no residual deficits, able to work/do all his ADLs prior to hospitalization.  PT recommends CIR, assessment pending   Chronic systolic CHF -most recent 2D echo March 2023 LVEF 25-30%, no WMA, grade 1 diastolic dysfunction, RV size and systolic function were normal.  Continue Coreg  at a lower dose, continue spironolactone , blood pressure better today but still hold Entresto    HTN -hold Entresto  perioperatively, potentially resume tomorrow   HLD -continue home medications   CKD 3 A -baseline creatinine 1.1-1.4, remains at baseline    DM 2, poorly controlled-placed on glargine/sliding scale  CBG (last 3)  Recent Labs    09/28/23 2133 09/29/23 0615 09/29/23 0808  GLUCAP 194* 167* 165*   Lab Results  Component Value Date   HGBA1C 8.1 (A) 07/08/2023    Scheduled Meds:  apixaban   5 mg Oral BID   carvedilol   6.25 mg Oral BID WC   dapagliflozin  propanediol  10 mg Oral Daily   docusate sodium   100 mg Oral BID   ezetimibe   10 mg Oral Daily   feeding supplement  237 mL Oral BID BM   insulin  aspart  0-5 Units Subcutaneous QHS   insulin  aspart  0-9 Units Subcutaneous TID WC   insulin  glargine  20 Units Subcutaneous Daily   multivitamin with minerals  1 tablet Oral Daily   spironolactone   12.5 mg Oral Daily   Continuous Infusions: PRN Meds:.acetaminophen , bisacodyl , HYDROcodone -acetaminophen , methocarbamol, morphine  injection, ondansetron  **OR** ondansetron  (ZOFRAN ) IV, polyethylene glycol, zolpidem   Current Outpatient Medications  Medication Instructions   acetaminophen  (TYLENOL ) 500 mg, Oral, Every 6 hours PRN   Aspirin -Caffeine (BC FAST PAIN RELIEF PO) 1 Package, Daily PRN   carvedilol  (COREG ) 12.5 MG tablet TAKE ONE TABLET BY MOUTH TWICE A DAY WITH A MEAL   chlorhexidine  (HIBICLENS ) 4 % external liquid 1 Application, Topical, As directed, Use as directed daily for 5 days every other week for 6 weeks.   dapagliflozin  propanediol (FARXIGA ) 10 mg, Oral, Daily   diclofenac  Sodium (VOLTAREN ) 1 % GEL Rub a small grape sized dollop into knee up to 3 times daily as needed.   diphenhydrAMINE  HCl, Sleep, (ZZZQUIL PO) 1 Dose, At bedtime PRN   Eliquis  5 mg, Oral, 2 times daily   Evolocumab  (REPATHA  SURECLICK) 140 MG/ML SOAJ INJECT 1 MILLILITER UNDER THE SKIN EVERY 14 DAYS   ezetimibe  (ZETIA ) 10 mg, Oral, Daily   glipiZIDE  (GLUCOTROL  XL) 10 mg, Oral, Daily with breakfast   HYDROcodone -acetaminophen  (NORCO/VICODIN) 5-325 MG tablet 1-2 tablets, Oral, Every 6 hours PRN   Lantus  SoloStar 45 Units, Subcutaneous, BH-each  morning, And pen needles 1/day   methocarbamol (ROBAXIN) 500 mg, Oral, Every 6 hours PRN   mupirocin  ointment (BACTROBAN ) 2 % 1 Application, Nasal, 2 times daily, Use as directed 2 times daily for 5 days every other week for 6 weeks.   sacubitril-valsartan (ENTRESTO ) 24-26 MG 1 tablet, Oral, 2 times daily   sildenafil  (VIAGRA ) 50-100 mg, Oral, Daily PRN   spironolactone  (ALDACTONE ) 12.5 mg, Oral, Daily   tirzepatide  (MOUNJARO ) 12.5 mg, Subcutaneous, Weekly    Diet Orders (From admission, onward)     Start     Ordered   09/27/23 1734  Diet regular Room service appropriate? Yes; Fluid consistency: Thin  Diet effective now       Question Answer Comment  Room service appropriate? Yes   Fluid consistency: Thin      09/27/23 1733            DVT prophylaxis: SCDs Start: 09/27/23 1734 apixaban  (ELIQUIS ) tablet 5 mg   Lab Results  Component Value Date   PLT 287 09/29/2023      Code Status: Full Code  Family Communication: No family at bedside  Status is: Inpatient Remains inpatient appropriate because: Severity of illness   Level of care: Telemetry Medical  Consultants:  Orthopedic surgery  Objective: Vitals:   09/28/23 1443 09/28/23 1949 09/29/23 0430 09/29/23 0808  BP: 115/81 110/76 126/76 113/85  Pulse: 99 100 99 99  Resp: 16  16   Temp: 97.8 F (36.6 C) 98 F (36.7 C) 98.2 F (36.8 C) 98.4 F (36.9 C)  TempSrc: Oral Oral Oral Oral  SpO2: 100% 97% 98% 97%  Weight:      Height:        Intake/Output Summary (Last 24 hours) at 09/29/2023 0824 Last data filed at 09/29/2023 0600 Gross per 24 hour  Intake 680 ml  Output 1050 ml  Net -370 ml   Wt Readings from Last 3 Encounters:  09/27/23 74.8 kg  07/08/23 76.4 kg  05/13/23 77.3 kg    Examination:  Constitutional: NAD Eyes: lids and conjunctivae normal, no scleral icterus ENMT: mmm Neck: normal, supple Respiratory: clear to auscultation bilaterally, no wheezing, no crackles. Cardiovascular: Regular  rate and rhythm, no murmurs / rubs / gallops. No LE edema. Abdomen: soft, no distention, no tenderness. Bowel sounds positive.    Data Reviewed: I have independently reviewed following labs and imaging studies   CBC Recent Labs  Lab 09/27/23 0604 09/28/23 0557 09/29/23 0539  WBC 14.1* 14.5* 13.7*  HGB 15.6 14.8 13.2  HCT 46.4 43.2 37.6*  PLT 336 327 287  MCV 94.3 91.7 91.5  MCH 31.7 31.4 32.1  MCHC 33.6 34.3 35.1  RDW 12.7 12.9 12.7  LYMPHSABS 1.6  --   --   MONOABS 0.8  --   --   EOSABS 0.2  --   --   BASOSABS 0.1  --   --     Recent Labs  Lab 09/27/23 0604 09/28/23 0557 09/29/23 0539  NA 137 136 135  K 4.5 3.9 4.0  CL 105 102 103  CO2 19* 21* 24  GLUCOSE 165* 165* 155*  BUN 27* 24* 30*  CREATININE 1.41* 1.36* 1.35*  CALCIUM  8.8* 8.6* 8.5*  AST  --  23 27  ALT  --  19 14  ALKPHOS  --  54 48  BILITOT  --  0.9 0.8  ALBUMIN  --  3.4* 3.1*  MG  --  2.0 2.0  INR 1.1  --   --     ------------------------------------------------------------------------------------------------------------------ No results for input(s): CHOL, HDL, LDLCALC, TRIG, CHOLHDL, LDLDIRECT in the last 72 hours.  Lab Results  Component Value Date   HGBA1C 8.1 (A) 07/08/2023   ------------------------------------------------------------------------------------------------------------------ No results for input(s): TSH, T4TOTAL, T3FREE, THYROIDAB in the last 72 hours.  Invalid input(s): FREET3  Cardiac Enzymes No results for input(s): CKMB, TROPONINI, MYOGLOBIN in the last 168 hours.  Invalid input(s): CK ------------------------------------------------------------------------------------------------------------------    Component Value Date/Time   BNP 455.8 (H) 10/18/2020 0916    CBG: Recent Labs  Lab 09/28/23 1140 09/28/23 1632 09/28/23 2133 09/29/23 0615 09/29/23 0808  GLUCAP 311* 192* 194* 167* 165*    Recent Results (from the past 240  hours)  Surgical pcr screen     Status: Abnormal   Collection Time: 09/27/23  9:56 AM   Specimen: Nasal Mucosa; Nasal Swab  Result Value Ref Range Status   MRSA, PCR NEGATIVE NEGATIVE Final   Staphylococcus aureus POSITIVE (A) NEGATIVE Final    Comment: (NOTE) The Xpert SA Assay (FDA approved for NASAL specimens in patients 46 years of age and older), is one component of a comprehensive surveillance program. It is not intended to diagnose infection nor to guide or monitor treatment. Performed at [facility], [address] [place], [place] [address]      Radiology Studies: No results found.    Nilda Fendt, MD, PhD [facility]  Between 7 am - 7 pm I am available, please contact me via Amion (for emergencies) or Securechat (non urgent messages)  Between 7 pm - 7 am I am not available, please contact night coverage MD/APP via Amion

## 2023-09-30 ENCOUNTER — Encounter (HOSPITAL_COMMUNITY): Payer: Self-pay

## 2023-09-30 LAB — GLUCOSE, CAPILLARY
Glucose-Capillary: 118 mg/dL — ABNORMAL HIGH (ref 70–99)
Glucose-Capillary: 94 mg/dL (ref 70–99)
Glucose-Capillary: 123 mg/dL — ABNORMAL HIGH (ref 70–99)
Glucose-Capillary: 178 mg/dL — ABNORMAL HIGH (ref 70–99)

## 2023-09-30 MED ORDER — POLYETHYLENE GLYCOL 3350 17 G PO PACK
17.0000 g | PACK | Freq: Two times a day (BID) | ORAL | Status: DC
  Administered 2023-09-30 – 2023-10-04 (×7): 17 g via ORAL
  Filled 2023-09-30 (×9): qty 1

## 2023-09-30 MED ORDER — SENNOSIDES-DOCUSATE SODIUM 8.6-50 MG PO TABS
2.0000 | ORAL_TABLET | Freq: Two times a day (BID) | ORAL | Status: DC
Start: 2023-09-30 — End: 2023-10-04
  Administered 2023-09-30 – 2023-10-04 (×8): 2 via ORAL
  Filled 2023-09-30 (×9): qty 2

## 2023-09-30 NOTE — Progress Notes (Addendum)
 Inpatient Rehab Admissions:  Inpatient Rehab Consult received.  I met with patient at the bedside for rehabilitation assessment and to discuss goals and expectations of an inpatient rehab admission.  Discussed average length of stay, insurance authorization requirement and discharge home after completion of CIR. Pt acknowledged understanding and is interested in pursuing CIR. Pt gave permission to contact wife Leita. Spoke with Leita on the telephone. She also acknowledged understanding and is supportive of pt pursuing CIR. She confirmed that family will be able to provide support for pt after discharge. Will continue to follow.  Signed: Tinnie Yvone Cohens, MS, CCC-SLP Admissions Coordinator [phone]

## 2023-09-30 NOTE — Progress Notes (Signed)
 While assessing patient's surgical incision on R hip, this RN noticed a great amount of dark red blood and clots draining (see media tab). Incision cleaned and redressed. Surgeon notified.   Metta Moats, RN

## 2023-09-30 NOTE — Progress Notes (Signed)
 Orthopaedic Progress Note  S: Patient continues to have pain in his right hip.  Is making slow progress with therapy  O:  Vitals:   09/30/23 0512 09/30/23 0900  BP: 121/85 136/88  Pulse: 95 97  Resp: 16   Temp: 99.5 F (37.5 C) 98.3 F (36.8 C)  SpO2: 98% 100%      There is no active drainage but significant blood clots are noted underneath the dressing along the most proximal incision.  Distal incisions are clean..  No surrounding erythema.  No calf tenderness.  5 out of 5 strength EHL FHL gastrocs tibialis anterior   Labs:  Results for orders placed or performed during the hospital encounter of 09/27/23 (from the past 24 hours)  Glucose, capillary     Status: Abnormal   Collection Time: 09/29/23  9:07 PM  Result Value Ref Range   Glucose-Capillary 149 (H) 70 - 99 mg/dL  Glucose, capillary     Status: Abnormal   Collection Time: 09/30/23  6:13 AM  Result Value Ref Range   Glucose-Capillary 118 (H) 70 - 99 mg/dL  Glucose, capillary     Status: None   Collection Time: 09/30/23 11:20 AM  Result Value Ref Range   Glucose-Capillary 94 70 - 99 mg/dL    Hemoglobin from yesterday was 13.2 hematocrit 37.6  Assessment: Status post IM rod right femur  Injuries: Right trochanteric hip fracture  Weightbearing: Weightbearing as tolerated  Insicional and dressing care: Dressing changes as needed.  Can apply compression dressing     Pain management: Continue current pain regimen  VTE prophylaxis: Continue with Eliquis    Dispo: To rehab  The patient is making slow but steady progress.  There was some concern about the recent blood clot seen around the proximal incision.  His H&H is stable.  Incision itself looks clean.  I just continue observe this.  Will continue with compression dressings.  He can continue mobilize with physical therapy.  He is weightbearing as tolerated.  He can be discharged to rehab when appropriate the medicine and therapy  Cordella Rhein, MD, MS [facility] [phone]

## 2023-09-30 NOTE — Progress Notes (Signed)
 Mobility Specialist Progress Note:   09/30/23 1617  Mobility  Activity Pivoted/transferred from chair to bed  Level of Assistance Moderate assist, patient does 50-74% (+2)  Assistive Device Front wheel walker  Distance Ambulated (ft) 5 ft  RLE Weight Bearing Per Provider Order WBAT  Activity Response Tolerated well  Mobility Referral Yes  Mobility visit 1 Mobility  Mobility Specialist Start Time (ACUTE ONLY) 1600  Mobility Specialist Stop Time (ACUTE ONLY) 1618  Mobility Specialist Time Calculation (min) (ACUTE ONLY) 18 min   Pt received in chair requesting assistance back to bed. Pt required ModA +2 for STS and MinA +2 to ambulate a short distance. Pt had difficulty fully extending and clearing RLE. WB through ball of R foot despite max VC. Returned to supine in bed w/ call bell and personal belongings in reach. All needs met. Bed alarm on. Family in room.   Thersia Minder Mobility Specialist  Please contact vis Secure Chat or  Rehab Office [phone]

## 2023-09-30 NOTE — Progress Notes (Addendum)
 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 Claim # [id number]. Insurer: [facility], [phone], [place] , [place]  [address]. Ph#: [phone]. Jon Hoit RN, BSN,CM  09/30/2023 @ [address] Claims Administrator -Arloa Prior, [phone] Claim adjuster: Basilia McCoomer  09/30/2023 @1530  Hospital CM received call from field CM , [facility]. Melissa provided NCM with her contact information and requested clinicals. [email or web address] [phone] Jon Hoit RN,BSN,CM

## 2023-09-30 NOTE — Progress Notes (Signed)
 PROGRESS NOTE  John [name] FMW:[id number] DOB: [birth year] DOA: 09/27/2023 PCP: Berneta Elsie Sayre, MD   LOS: 3 days   Brief Narrative / Interim history: 63 y.o. male with medical history significant of CAD, ischemic cardiomyopathy managed medically, prior CVA, HTN, HLD, DM 2, chronic systolic CHF with an ICD implant comes into the hospital after ground-level fall.  He works nights, at [employer], was pulling on a heavy object then lost grip and balance and landed on his back. Following that, he experienced right hip pain, was unable to walk and came to the hospital.  He was found to have a hip fracture, orthopedic surgery was consulted and he was admitted to the hospitalist service.  Subjective / 24h Interval events: Still remains with soreness at the hip site.  Has not worked in therapy yesterday  Assesement and Plan: Principal problem Right hip fracture -orthopedic surgery consulted, appreciate input.  Patient was taken to the OR 9/26 by Dr. Reyne status post intertrochanteric fixation with IM rod. - Eliquis  has been resumed - PT recommends CIR, formal assessment pending   Active problems CAD, ischemic cardiomyopathy -prior to his fall he has no ischemic symptoms, no angina, able to do all his ADLs work at full capacity without any issues.   - Remains stable today, euvolemic, no chest pain, no shortness of breath   Prior CVA-no residual deficits, able to work/do all his ADLs prior to hospitalization.  PT recommends CIR, assessment pending   Chronic systolic CHF -most recent 2D echo March 2023 LVEF 25-30%, no WMA, grade 1 diastolic dysfunction, RV size and systolic function were normal.  Continue Coreg  at a lower dose, continue spironolactone , blood pressure stabilized postoperatively, resume Entresto  today   HTN -hold Entresto  perioperatively, potentially resume tomorrow   HLD -continue home medications   CKD 3 A -baseline creatinine 1.1-1.4, remains at baseline   DM  2, poorly controlled-placed on glargine/sliding scale  CBG (last 3)  Recent Labs    09/29/23 1611 09/29/23 2107 09/30/23 0613  GLUCAP 109* 149* 118*   Lab Results  Component Value Date   HGBA1C 8.1 (A) 07/08/2023    Scheduled Meds:  apixaban   5 mg Oral BID   carvedilol   6.25 mg Oral BID WC   dapagliflozin  propanediol  10 mg Oral Daily   ezetimibe   10 mg Oral Daily   feeding supplement  237 mL Oral BID BM   insulin  aspart  0-5 Units Subcutaneous QHS   insulin  aspart  0-9 Units Subcutaneous TID WC   insulin  glargine  20 Units Subcutaneous Daily   multivitamin with minerals  1 tablet Oral Daily   polyethylene glycol  17 g Oral BID   senna-docusate  2 tablet Oral BID   spironolactone   12.5 mg Oral Daily   Continuous Infusions: PRN Meds:.acetaminophen , bisacodyl , HYDROcodone -acetaminophen , methocarbamol, morphine  injection, ondansetron  **OR** ondansetron  (ZOFRAN ) IV, polyethylene glycol, zolpidem   Current Outpatient Medications  Medication Instructions   acetaminophen  (TYLENOL ) 500 mg, Oral, Every 6 hours PRN   Aspirin -Caffeine (BC FAST PAIN RELIEF PO) 1 Package, Daily PRN   carvedilol  (COREG ) 12.5 MG tablet TAKE ONE TABLET BY MOUTH TWICE A DAY WITH A MEAL   chlorhexidine  (HIBICLENS ) 4 % external liquid 1 Application, Topical, As directed, Use as directed daily for 5 days every other week for 6 weeks.   dapagliflozin  propanediol (FARXIGA ) 10 mg, Oral, Daily   diclofenac  Sodium (VOLTAREN ) 1 % GEL Rub a small grape sized dollop into knee up to 3 times daily  as needed.   diphenhydrAMINE HCl, Sleep, (ZZZQUIL PO) 1 Dose, At bedtime PRN   Eliquis  5 mg, Oral, 2 times daily   Evolocumab  (REPATHA  SURECLICK) 140 MG/ML SOAJ INJECT 1 MILLILITER UNDER THE SKIN EVERY 14 DAYS   ezetimibe  (ZETIA ) 10 mg, Oral, Daily   glipiZIDE  (GLUCOTROL  XL) 10 mg, Oral, Daily with breakfast   HYDROcodone -acetaminophen  (NORCO/VICODIN) 5-325 MG tablet 1-2 tablets, Oral, Every 6 hours PRN   Lantus  SoloStar 45  Units, Subcutaneous, BH-each morning, And pen needles 1/day   methocarbamol (ROBAXIN) 500 mg, Oral, Every 6 hours PRN   mupirocin  ointment (BACTROBAN ) 2 % 1 Application, Nasal, 2 times daily, Use as directed 2 times daily for 5 days every other week for 6 weeks.   sacubitril-valsartan (ENTRESTO ) 24-26 MG 1 tablet, Oral, 2 times daily   sildenafil  (VIAGRA ) 50-100 mg, Oral, Daily PRN   spironolactone  (ALDACTONE ) 12.5 mg, Oral, Daily   tirzepatide  (MOUNJARO ) 12.5 mg, Subcutaneous, Weekly    Diet Orders (From admission, onward)     Start     Ordered   09/27/23 1734  Diet regular Room service appropriate? Yes; Fluid consistency: Thin  Diet effective now       Question Answer Comment  Room service appropriate? Yes   Fluid consistency: Thin      09/27/23 1733            DVT prophylaxis: SCDs Start: 09/27/23 1734 apixaban  (ELIQUIS ) tablet 5 mg   Lab Results  Component Value Date   PLT 287 09/29/2023      Code Status: Full Code  Family Communication: No family at bedside  Status is: Inpatient Remains inpatient appropriate because: Severity of illness   Level of care: Telemetry Medical  Consultants:  Orthopedic surgery  Objective: Vitals:   09/29/23 0808 09/29/23 1429 09/29/23 1928 09/30/23 0512  BP: 113/85 123/76 107/70 121/85  Pulse: 99 94 94 95  Resp:   16 16  Temp: 98.4 F (36.9 C) 98.2 F (36.8 C) 98.3 F (36.8 C) 99.5 F (37.5 C)  TempSrc: Oral Oral Oral Oral  SpO2: 97% 97% 99% 98%  Weight:      Height:        Intake/Output Summary (Last 24 hours) at 09/30/2023 9043 Last data filed at 09/30/2023 0300 Gross per 24 hour  Intake --  Output 600 ml  Net -600 ml   Wt Readings from Last 3 Encounters:  09/27/23 74.8 kg  07/08/23 76.4 kg  05/13/23 77.3 kg    Examination:  Constitutional: NAD Eyes: lids and conjunctivae normal, no scleral icterus ENMT: mmm Neck: normal, supple Respiratory: clear to auscultation bilaterally, no wheezing, no crackles.   Cardiovascular: Regular rate and rhythm, no murmurs / rubs / gallops. No LE edema. Abdomen: soft, no distention, no tenderness. Bowel sounds positive.   Data Reviewed: I have independently reviewed following labs and imaging studies   CBC Recent Labs  Lab 09/27/23 0604 09/28/23 0557 09/29/23 0539  WBC 14.1* 14.5* 13.7*  HGB 15.6 14.8 13.2  HCT 46.4 43.2 37.6*  PLT 336 327 287  MCV 94.3 91.7 91.5  MCH 31.7 31.4 32.1  MCHC 33.6 34.3 35.1  RDW 12.7 12.9 12.7  LYMPHSABS 1.6  --   --   MONOABS 0.8  --   --   EOSABS 0.2  --   --   BASOSABS 0.1  --   --     Recent Labs  Lab 09/27/23 0604 09/28/23 0557 09/29/23 0539  NA 137 136 135  K  4.5 3.9 4.0  CL 105 102 103  CO2 19* 21* 24  GLUCOSE 165* 165* 155*  BUN 27* 24* 30*  CREATININE 1.41* 1.36* 1.35*  CALCIUM  8.8* 8.6* 8.5*  AST  --  23 27  ALT  --  19 14  ALKPHOS  --  54 48  BILITOT  --  0.9 0.8  ALBUMIN  --  3.4* 3.1*  MG  --  2.0 2.0  INR 1.1  --   --     ------------------------------------------------------------------------------------------------------------------ No results for input(s): CHOL, HDL, LDLCALC, TRIG, CHOLHDL, LDLDIRECT in the last 72 hours.  Lab Results  Component Value Date   HGBA1C 8.1 (A) 07/08/2023   ------------------------------------------------------------------------------------------------------------------ No results for input(s): TSH, T4TOTAL, T3FREE, THYROIDAB in the last 72 hours.  Invalid input(s): FREET3  Cardiac Enzymes No results for input(s): CKMB, TROPONINI, MYOGLOBIN in the last 168 hours.  Invalid input(s): CK ------------------------------------------------------------------------------------------------------------------    Component Value Date/Time   BNP 455.8 (H) 10/18/2020 0916    CBG: Recent Labs  Lab 09/29/23 0808 09/29/23 1126 09/29/23 1611 09/29/23 2107 09/30/23 0613  GLUCAP 165* 226* 109* 149* 118*    Recent  Results (from the past 240 hours)  Surgical pcr screen     Status: Abnormal   Collection Time: 09/27/23  9:56 AM   Specimen: Nasal Mucosa; Nasal Swab  Result Value Ref Range Status   MRSA, PCR NEGATIVE NEGATIVE Final   Staphylococcus aureus POSITIVE (A) NEGATIVE Final    Comment: (NOTE) The Xpert SA Assay (FDA approved for NASAL specimens in patients 32 years of age and older), is one component of a comprehensive surveillance program. It is not intended to diagnose infection nor to guide or monitor treatment. Performed at [facility], [address] [place], [place] [address]      Radiology Studies: No results found.    Nilda Fendt, MD, PhD [facility]  Between 7 am - 7 pm I am available, please contact me via Amion (for emergencies) or Securechat (non urgent messages)  Between 7 pm - 7 am I am not available, please contact night coverage MD/APP via Amion

## 2023-09-30 NOTE — PMR Pre-admission (Signed)
 PMR Admission Coordinator Pre-Admission Assessment  Patient: John [name] is an 63 y.o., male MRN: [id number] DOB: 08-26-1960 Height: 5' 7 (170.2 cm) Weight: 74.8 kg  Insurance Information HMO: ***    PPO: ***     PCP: ***     IPA: ***     80/20: ***     OTHER: *** PRIMARY: ***      Policy#: ***      Subscriber: *** CM Name: ***      Phone#: ***     Fax#: *** Pre-Cert#: ***      Employer: *** Benefits:  Phone #: ***     Name: *** Eff. Date: ***     Deduct: ***      Out of Pocket Max: ***      Life Max: *** CIR: ***      SNF: *** Outpatient: ***     Co-Pay: *** Home Health: ***      Co-Pay: *** DME: ***     Co-Pay: *** Providers: in-network SECONDARY:       Policy#:      Phone#:   Financial Counselor:       Phone#:   The Data processing manager" for patients in Inpatient Rehabilitation Facilities with attached "Privacy Act Statement-Health Care Records" was provided and verbally reviewed with: Patient  Emergency Contact Information Contact Information     Name Relation Home Work Mobile   [place] Spouse [phone]  [phone]   Logiudice,tara Daughter   [phone]      Other Contacts   None on File     Current Medical History  Patient Admitting Diagnosis: Right hip fracture History of Present Illness: Pt is a 63 year old male with medical hx significant for: chronic systolic CHF n with an ICD implant, HTN, HLD, DM II, CAD, CVA on Eliquis . Pt presented to [facility] on 09/27/23 d/t fall at work. Pt may have hit head but denied LOC. Pt reported pain in right hip. CT head negative for acute abnormalities. Hip x-ray showed right trochanteric hip fracture. Orthopedics consulted. Pt underwent intertrochanteric fixation with IM rod by Dr. Reyne on 09/27/23. Therapy evaluations completed and CIR recommended d/t pt's deficits in functional mobility.     Patient's medical record from [facility] has been reviewed by the rehabilitation  admission coordinator and physician.  Past Medical History  Past Medical History:  Diagnosis Date   AICD (automatic cardioverter/defibrillator) present    Arthritis    Diabetes mellitus without complication (HCC)    Hyperlipidemia    Hypertension    Stroke (HCC) 05/2020    Has the patient had major surgery during 100 days prior to admission? Yes  Family History   family history includes CAD in his mother; COPD in his father; Diabetes in his mother; Heart attack in his mother.  Current Medications  Current Facility-Administered Medications:    acetaminophen  (TYLENOL ) tablet 325-650 mg, 325-650 mg, Oral, Q6H PRN, Danton Lauraine LABOR, PA-C, 650 mg at 09/30/23 9485   apixaban  (ELIQUIS ) tablet 5 mg, 5 mg, Oral, BID, Danton Lauraine LABOR, PA-C, 5 mg at 09/30/23 1006   bisacodyl  (DULCOLAX) EC tablet 5 mg, 5 mg, Oral, Daily PRN, Gherghe, Costin M, MD   carvedilol  (COREG ) tablet 6.25 mg, 6.25 mg, Oral, BID WC, Gherghe, Costin M, MD, 6.25 mg at 09/30/23 1006   dapagliflozin  propanediol (FARXIGA ) tablet 10 mg, 10 mg, Oral, Daily, Gherghe, Costin M, MD, 10 mg at 09/30/23 1006   ezetimibe  (ZETIA )  tablet 10 mg, 10 mg, Oral, Daily, Gherghe, Costin M, MD, 10 mg at 09/30/23 1006   feeding supplement (ENSURE PLUS HIGH PROTEIN) liquid 237 mL, 237 mL, Oral, BID BM, Gherghe, Costin M, MD, 237 mL at 09/30/23 1009   HYDROcodone -acetaminophen  (NORCO/VICODIN) 5-325 MG per tablet 1-2 tablet, 1-2 tablet, Oral, Q6H PRN, Gherghe, Costin M, MD, 2 tablet at 09/30/23 9487   insulin  aspart (novoLOG ) injection 0-5 Units, 0-5 Units, Subcutaneous, QHS, Gherghe, Costin M, MD   insulin  aspart (novoLOG ) injection 0-9 Units, 0-9 Units, Subcutaneous, TID WC, Gherghe, Costin M, MD, 3 Units at 09/29/23 1132   insulin  glargine (LANTUS ) injection 20 Units, 20 Units, Subcutaneous, Daily, Gherghe, Costin M, MD, 20 Units at 09/30/23 1008   methocarbamol (ROBAXIN) tablet 500 mg, 500 mg, Oral, Q6H PRN, Danton Lauraine LABOR, PA-C, 500 mg at  09/30/23 1006   morphine  (PF) 2 MG/ML injection 0.5 mg, 0.5 mg, Intravenous, Q2H PRN, Gherghe, Costin M, MD, 0.5 mg at 09/30/23 1142   multivitamin with minerals tablet 1 tablet, 1 tablet, Oral, Daily, Gherghe, Costin M, MD, 1 tablet at 09/30/23 1006   ondansetron  (ZOFRAN ) tablet 4 mg, 4 mg, Oral, Q6H PRN **OR** ondansetron  (ZOFRAN ) injection 4 mg, 4 mg, Intravenous, Q6H PRN, Reyne Cordella SQUIBB, MD   polyethylene glycol (MIRALAX  / GLYCOLAX ) packet 17 g, 17 g, Oral, Daily PRN, Reyne Cordella SQUIBB, MD, 17 g at 09/29/23 0815   polyethylene glycol (MIRALAX  / GLYCOLAX ) packet 17 g, 17 g, Oral, BID, Gherghe, Costin M, MD, 17 g at 09/30/23 1008   senna-docusate (Senokot-S) tablet 2 tablet, 2 tablet, Oral, BID, Gherghe, Costin M, MD, 2 tablet at 09/30/23 1006   spironolactone  (ALDACTONE ) tablet 12.5 mg, 12.5 mg, Oral, Daily, Gherghe, Costin M, MD, 12.5 mg at 09/30/23 1006   zolpidem  (AMBIEN ) tablet 5 mg, 5 mg, Oral, QHS PRN,MR X 1, Reyne Cordella SQUIBB, MD, 5 mg at 09/29/23 2125  Patients Current Diet:  Diet Order             Diet regular Room service appropriate? Yes; Fluid consistency: Thin  Diet effective now                   Precautions / Restrictions Precautions Precautions: Fall Restrictions Weight Bearing Restrictions Per Provider Order: No RLE Weight Bearing Per Provider Order: Weight bearing as tolerated Other Position/Activity Restrictions: Unrestricted RLE ROM   Has the patient had 2 or more falls or a fall with injury in the past year? Yes  Prior Activity Level Community (5-7x/wk): drives, works, gets out of house often  Prior Functional Level Self Care: Did the patient need help bathing, dressing, using the toilet or eating? Independent  Indoor Mobility: Did the patient need assistance with walking from room to room (with or without device)? Independent  Stairs: Did the patient need assistance with internal or external stairs (with or without device)?  Independent  Functional Cognition: Did the patient need help planning regular tasks such as shopping or remembering to take medications? Needed some help  Patient Information Are you of Hispanic, Latino/a,or Spanish origin?: A. No, not of Hispanic, Latino/a, or Spanish origin What is your race?: A. White Do you need or want an interpreter to communicate with a doctor or health care staff?: 0. No  Patient's Response To:  Health Literacy and Transportation Is the patient able to respond to health literacy and transportation needs?: Yes Health Literacy - How often do you need to have someone help you when you read instructions,  pamphlets, or other written material from your doctor or pharmacy?: Never In the past 12 months, has lack of transportation kept you from medical appointments or from getting medications?: No In the past 12 months, has lack of transportation kept you from meetings, work, or from getting things needed for daily living?: No  Home Assistive Devices / Equipment Home Equipment: Agricultural consultant (2 wheels), Ransom - single point, [employer] - quad, Occupational hygienist (4 wheels), Shower seat, Wheelchair - manual, BSC/3in1  Prior Device Use: Indicate devices/aids used by the patient prior to current illness, exacerbation or injury? None of the above  Current Functional Level Cognition  Orientation Level: Oriented X4    Extremity Assessment (includes Sensation/Coordination)  Upper Extremity Assessment: Overall WFL for tasks assessed  Lower Extremity Assessment: Defer to PT evaluation RLE Deficits / Details: Pt POD 1 s/p L hip IM rod. Limited hip and knee AROM, decreased ankle AROM. Pt guarded against PROM attempts. RLE: Unable to fully assess due to pain RLE Sensation: WNL RLE Coordination: decreased gross motor    ADLs  Overall ADL's : Needs assistance/impaired Eating/Feeding: Independent, Sitting Grooming: Wash/dry hands, Wash/dry face, Set up, Sitting Grooming Details (indicate cue  type and reason): simulated Upper Body Bathing: Set up, Sitting Upper Body Bathing Details (indicate cue type and reason): simulated Lower Body Bathing: Maximal assistance, Sit to/from stand Upper Body Dressing : Set up, Sitting Lower Body Dressing: Maximal assistance, Sit to/from stand Toilet Transfer: Moderate assistance, Stand-pivot, BSC/3in1, Rolling walker (2 wheels) Toileting- Clothing Manipulation and Hygiene: Moderate assistance, Sit to/from stand Functional mobility during ADLs: Moderate assistance, Rolling walker (2 wheels), Cueing for sequencing (ambulate short distance from bed to chair) General ADL Comments: Pt with decreased ability to transition from supine to sit needing max assist to complete. Increased left lean in sitting secondary to pain in the right hip.  Pt's daughter reports that the spouse recently had knee surgery and cannot provide a lot of physical assist but some.  Daughter can also come in and help some when she is not working.  Increased right knee flexion noted in standing with decreased efficiency with advancing the LLE.    Mobility  Overal bed mobility: Needs Assistance Bed Mobility: Supine to Sit Supine to sit: Max assist General bed mobility comments: Assist with advancing RLE off of the bed and then bringing trunk up.    Transfers  Overall transfer level: Needs assistance Equipment used: Rolling walker (2 wheels) Transfers: Sit to/from Stand, Bed to chair/wheelchair/BSC Sit to Stand: Mod assist, From elevated surface Bed to/from chair/wheelchair/BSC transfer type:: Step pivot Step pivot transfers: Mod assist General transfer comment: Pt with mod demonstrational cueing needed for sequencing walker and not placing forearms on it or holding between his first and second fingers.  Mod assist initially to advance the RLE but then he was able to complete slowly without assist, short step length.    Ambulation / Gait / Stairs / Wheelchair Mobility   Ambulation/Gait Ambulation/Gait assistance: Editor, commissioning (Feet): 2 Feet (x2 (forwards and backwards)) Assistive device: Rolling walker (2 wheels) Gait Pattern/deviations: Step-to pattern, Decreased step length - right, Decreased step length - left, Decreased stance time - right, Decreased weight shift to right, Knee flexed in stance - right, Antalgic, Trunk flexed General Gait Details: Pt took short slow labored steps with heavy reliance on BUE support on RW. Pt unable to clear R foot from ground, toes dragging. Assist by PT to advance RLE forward through post-ant support at pt's heel. He  self-limited WBing on RLE d/t pain. Cues for sequencing. Assist to manuever RW for safety/stability. He maintained RLE in knee flex and held infront of him instead of beneath his trunk. Unsteady,  but no LOB. Gait velocity: decr    Posture / Balance Dynamic Sitting Balance Sitting balance - Comments: Increased right lean in sitting, needing UE support to maintain Balance Overall balance assessment: Needs assistance Sitting-balance support: Bilateral upper extremity supported, Feet supported Sitting balance-Leahy Scale: Poor Sitting balance - Comments: Increased right lean in sitting, needing UE support to maintain Standing balance support: Bilateral upper extremity supported, During functional activity, Reliant on assistive device for balance Standing balance-Leahy Scale: Poor Standing balance comment: Mod assist for mobility with reliance on RW for support to take steps.    Special considerations/life events  Skin Surgical Incision: hip/right and Diabetic management Novolog  0-9 units 3x daily with meals; Novolog  0-5 units daily at bedtime; Lantus  20 units daily   Previous Home Environment (from acute therapy documentation) Living Arrangements: Spouse/significant other Available Help at Discharge: Available 24 hours/day, Family Type of Home: House Home Layout: One level Home Access: Stairs to  enter, Level entry Entrance Stairs-Rails: None Entrance Stairs-Number of Steps: 3 (can walk around back to where no steps are at) [employer] Shower/Tub: [employer], Engineer, manufacturing systems: Standard Bathroom Accessibility: No Home Care Services: No  Discharge Living Setting Plans for Discharge Living Setting: Patient's home Type of Home at Discharge: House Discharge Home Layout: One level (split level) Discharge Home Access: Stairs to enter (Stairs in front. No stairs in back.) Entrance Stairs-Rails: None Entrance Stairs-Number of Steps: 3 Discharge Bathroom Shower/Tub: Walk-in shower, Tub/shower unit Discharge Bathroom Toilet: Standard Discharge Bathroom Accessibility: No Does the patient have any problems obtaining your medications?: No  Social/Family/Support Systems Anticipated Caregiver: Takari Lundahl, wife Anticipated Caregiver's Contact Information: [phone] Caregiver Availability: 24/7 Discharge Plan Discussed with Primary Caregiver: Yes Is Caregiver In Agreement with Plan?: Yes Does Caregiver/Family have Issues with Lodging/Transportation while Pt is in Rehab?: No  Goals Patient/Family Goal for Rehab: *** Expected length of stay: *** Pt/Family Agrees to Admission and willing to participate: Yes Program Orientation Provided & Reviewed with Pt/Caregiver Including Roles  & Responsibilities: Yes  Decrease burden of Care through IP rehab admission: NA  Possible need for SNF placement upon discharge: Not anticipated  Patient Condition: I have reviewed medical records from [facility], spoken with CM, and patient and spouse. I met with patient at the bedside and discussed via phone for inpatient rehabilitation assessment.  Patient will benefit from ongoing PT and OT, can actively participate in 3 hours of therapy a day 5 days of the week, and can make measurable gains during the admission.  Patient will also benefit from the coordinated team approach during  an Inpatient Acute Rehabilitation admission.  The patient will receive intensive therapy as well as Rehabilitation physician, nursing, social worker, and care management interventions.  Due to safety, skin/wound care, disease management, medication administration, pain management, and patient education the patient requires 24 hour a day rehabilitation nursing.  The patient is currently *** with mobility and basic ADLs.  Discharge setting and therapy post discharge at home with home health is anticipated.  Patient has agreed to participate in the Acute Inpatient Rehabilitation Program and will admit {Time; today/tomorrow:10263}.  Preadmission Screen Completed By:  Tinnie SHAUNNA Yvone Delayne, 09/30/2023 12:17 PM ______________________________________________________________________   Discussed status with Dr. PIERRETTE on *** at *** and received approval for admission today.  Admission Coordinator:  Lauren SHAUNNA Yvone Cohens, CCC-SLP, time ***/Date ***   Assessment/Plan: Diagnosis: *** Does the need for close, 24 hr/day Medical supervision in concert with the patient's rehab needs make it unreasonable for this patient to be served in a less intensive setting? {yes_no_potentially:3041433} Co-Morbidities requiring supervision/potential complications: *** Due to {due un:6958565}, does the patient require 24 hr/day rehab nursing? {yes_no_potentially:3041433} Does the patient require coordinated care of a physician, rehab nurse, PT, OT, and SLP to address physical and functional deficits in the context of the above medical diagnosis(es)? {yes_no_potentially:3041433} Addressing deficits in the following areas: {deficits:3041436} Can the patient actively participate in an intensive therapy program of at least 3 hrs of therapy 5 days a week? {yes_no_potentially:3041433} The potential for patient to make measurable gains while on inpatient rehab is {potential:3041437} Anticipated functional outcomes upon discharge from  inpatient rehab: {functional outcomes:304600100} PT, {functional outcomes:304600100} OT, {functional outcomes:304600100} SLP Estimated rehab length of stay to reach the above functional goals is: *** Anticipated discharge destination: {anticipated dc setting:21604} 10. Overall Rehab/Functional Prognosis: {potential:3041437}   MD Signature: ***

## 2023-09-30 NOTE — Progress Notes (Signed)
 Physical Therapy Treatment Patient Details Name: John [name] MRN: [id number] DOB: April 11, 1960 Today's Date: 09/30/2023   History of Present Illness John [name] is a 63 y.o. male admitted 09/27/23 after ground-level fall sustaining right displaced intertrochanteric hip fracture. Pt s/p IM rod 9/26. PMHx: CAD, ischemic cardiomyopathy, CVA, HTN, HLD, T2DM,  arthritis, and chronic systolic CHF with an ICD implant.    PT Comments  Pt resting in bed on arrival, pleasant and agreeable to session, despite increased c/o pain at rest. Pt requiring max A to come to EOB with assist needed for all aspects as well as dense cues for sequencing and technique. Pt able to rise to standing with min A from slight elevated EOB and progress gait for a few steps away from EOB with hands on assist to advance RLE through swing phase as pt with poor active hip/knee flexion. Pt up in chair at end of session and endorsing reduced pain at end of session post mobility. Continued education on LE exercises to complete throughout day with pt verbalizing/demonstrating understanding. Pt continues to benefit from skilled PT services to progress toward functional mobility goals.     If plan is discharge home, recommend the following: Two people to help with walking and/or transfers;A lot of help with bathing/dressing/bathroom;Assistance with cooking/housework;Assist for transportation;Help with stairs or ramp for entrance   Can travel by private vehicle        Equipment Recommendations  None recommended by PT (Pt already has DME)    Recommendations for Other Services       Precautions / Restrictions Precautions Precautions: Fall Recall of Precautions/Restrictions: Impaired Restrictions Weight Bearing Restrictions Per Provider Order: No RLE Weight Bearing Per Provider Order: Weight bearing as tolerated Other Position/Activity Restrictions: Unrestricted RLE ROM     Mobility  Bed Mobility Overal bed mobility:  Needs Assistance Bed Mobility: Supine to Sit     Supine to sit: Max assist     General bed mobility comments: Assist with advancing RLE off of the bed and then bringing trunk up.    Transfers Overall transfer level: Needs assistance Equipment used: Rolling walker (2 wheels) Transfers: Sit to/from Stand, Bed to chair/wheelchair/BSC Sit to Stand: From elevated surface, Min assist           General transfer comment: EOB elevated slighty, cues for hand placement, min A to rise to standing    Ambulation/Gait Ambulation/Gait assistance: Min assist Gait Distance (Feet): 4 Feet Assistive device: Rolling walker (2 wheels) Gait Pattern/deviations: Step-to pattern, Decreased step length - right, Decreased step length - left, Decreased stance time - right, Decreased weight shift to right, Knee flexed in stance - right, Antalgic, Trunk flexed Gait velocity: decr     General Gait Details: Pt took short slow labored steps with heavy reliance on BUE support on RW. Pt unable to clear R foot from ground, toes dragging. Assist by PTA to advance RLE. He self-limited WBing on RLE d/t pain. Cues for sequencing. Assist to manuever RW for safety/stability. Unsteady,  but no LOB.   Stairs             Wheelchair Mobility     Tilt Bed    Modified Rankin (Stroke Patients Only)       Balance Overall balance assessment: Needs assistance Sitting-balance support: Bilateral upper extremity supported, Feet supported Sitting balance-Leahy Scale: Poor Sitting balance - Comments: Increased right lean in sitting, needing UE support to maintain   Standing balance support: Bilateral upper extremity supported,  During functional activity, Reliant on assistive device for balance Standing balance-Leahy Scale: Poor Standing balance comment: Mod assist for mobility with reliance on RW for support to take steps.                            Communication Communication Communication:  Impaired Factors Affecting Communication: Hearing impaired  Cognition Arousal: Alert Behavior During Therapy: WFL for tasks assessed/performed   PT - Cognitive impairments: No apparent impairments                         Following commands: Intact      Cueing Cueing Techniques: Verbal cues  Exercises Other Exercises Other Exercises: reviewed R hip HEP    General Comments        Pertinent Vitals/Pain Pain Assessment Pain Assessment: Faces Faces Pain Scale: Hurts even more Pain Location: R hip and knee Pain Descriptors / Indicators: Operative site guarding, Grimacing, Moaning, Aching, Discomfort, Sharp Pain Intervention(s): Premedicated before session, Monitored during session, Limited activity within patient's tolerance, Repositioned    Home Living     Available Help at Discharge: Available 24 hours/day;Family                    Prior Function            PT Goals (current goals can now be found in the care plan section) Acute Rehab PT Goals Patient Stated Goal: Have less pain, regain independence, and return to work PT Goal Formulation: With patient Time For Goal Achievement: 10/12/23 Progress towards PT goals: Progressing toward goals    Frequency    Min 3X/week      PT Plan      Co-evaluation              AM-PAC PT 6 Clicks Mobility   Outcome Measure  Help needed turning from your back to your side while in a flat bed without using bedrails?: A Lot Help needed moving from lying on your back to sitting on the side of a flat bed without using bedrails?: A Lot Help needed moving to and from a bed to a chair (including a wheelchair)?: A Lot Help needed standing up from a chair using your arms (e.g., wheelchair or bedside chair)?: A Lot Help needed to walk in hospital room?: A Lot Help needed climbing 3-5 steps with a railing? : Total 6 Click Score: 11    End of Session   Activity Tolerance: Patient limited by pain Patient  left: in chair;with call bell/phone within reach Nurse Communication: Mobility status PT Visit Diagnosis: Pain;Difficulty in walking, not elsewhere classified (R26.2);Other abnormalities of gait and mobility (R26.89);Unsteadiness on feet (R26.81) Pain - Right/Left: Right Pain - part of body: Hip;Knee     Time: 8741-8674 PT Time Calculation (min) (ACUTE ONLY): 27 min  Charges:    $Gait Training: 8-22 mins $Therapeutic Activity: 8-22 mins PT General Charges $$ ACUTE PT VISIT: 1 Visit                     Durelle Zepeda R. PTA Acute Rehabilitation Services Office: [phone]   Therisa CHRISTELLA Boor 09/30/2023, 2:16 PM

## 2023-10-01 LAB — MAGNESIUM: Magnesium: 2 mg/dL (ref 1.7–2.4)

## 2023-10-01 LAB — COMPREHENSIVE METABOLIC PANEL WITH GFR
ALT: 14 U/L (ref 0–44)
AST: 26 U/L (ref 15–41)
Albumin: 2.9 g/dL — ABNORMAL LOW (ref 3.5–5.0)
Alkaline Phosphatase: 46 U/L (ref 38–126)
Anion gap: 11 (ref 5–15)
BUN: 27 mg/dL — ABNORMAL HIGH (ref 8–23)
CO2: 22 mmol/L (ref 22–32)
Calcium: 8.4 mg/dL — ABNORMAL LOW (ref 8.9–10.3)
Chloride: 98 mmol/L (ref 98–111)
Creatinine, Ser: 1.22 mg/dL (ref 0.61–1.24)
GFR, Estimated: >60 mL/min (ref >60)
Glucose, Bld: 117 mg/dL — ABNORMAL HIGH (ref 70–99)
Potassium: 4.1 mmol/L (ref 3.5–5.1)
Sodium: 131 mmol/L — ABNORMAL LOW (ref 135–145)
Total Bilirubin: 1 mg/dL (ref 0.0–1.2)
Total Protein: 5.8 g/dL — ABNORMAL LOW (ref 6.5–8.1)

## 2023-10-01 LAB — CBC
HCT: 33.3 % — ABNORMAL LOW (ref 39.0–52.0)
Hemoglobin: 11.7 g/dL — ABNORMAL LOW (ref 13.0–17.0)
MCH: 32 pg (ref 26.0–34.0)
MCHC: 35.1 g/dL (ref 30.0–36.0)
MCV: 91 fL (ref 80.0–100.0)
Platelets: 336 K/uL (ref 150–400)
RBC: 3.66 MIL/uL — ABNORMAL LOW (ref 4.22–5.81)
RDW: 12.6 % (ref 11.5–15.5)
WBC: 14.5 K/uL — ABNORMAL HIGH (ref 4.0–10.5)
nRBC: 0 % (ref 0.0–0.2)

## 2023-10-01 LAB — GLUCOSE, CAPILLARY
Glucose-Capillary: 137 mg/dL — ABNORMAL HIGH (ref 70–99)
Glucose-Capillary: 196 mg/dL — ABNORMAL HIGH (ref 70–99)
Glucose-Capillary: 252 mg/dL — ABNORMAL HIGH (ref 70–99)
Glucose-Capillary: 193 mg/dL — ABNORMAL HIGH (ref 70–99)

## 2023-10-01 MED ORDER — CARVEDILOL 12.5 MG PO TABS
12.5000 mg | ORAL_TABLET | Freq: Two times a day (BID) | ORAL | Status: DC
  Administered 2023-10-01 – 2023-10-04 (×7): 12.5 mg via ORAL
  Filled 2023-10-01 (×7): qty 1

## 2023-10-01 NOTE — Progress Notes (Signed)
 PROGRESS NOTE  John [name] FMW:[id number] DOB: Jan 16, 1960 DOA: 09/27/2023 PCP: Berneta Elsie Sayre, MD   LOS: 4 days   Brief Narrative / Interim history: 63 y.o. male with medical history significant of CAD, ischemic cardiomyopathy managed medically, prior CVA, HTN, HLD, DM 2, chronic systolic CHF with an ICD implant comes into the hospital after ground-level fall.  He works nights, at [employer], was pulling on a heavy object then lost grip and balance and landed on his back. Following that, he experienced right hip pain, was unable to walk and came to the hospital.  He was found to have a hip fracture, orthopedic surgery was consulted and he was admitted to the hospitalist service.  Subjective / 24h Interval events: Doing well, mobilizing slightly better with PT today. No chest pain, no dyspnea, no nausea or vomiting.   Assesement and Plan: Principal problem Right hip fracture -orthopedic surgery consulted, appreciate input.  Patient was taken to the OR 9/26 by Dr. Reyne status post intertrochanteric fixation with IM rod. - Eliquis  has been resumed - PT recommends CIR, formal assessment pending, also insurance auth (worker's comp)   Active problems CAD, ischemic cardiomyopathy -prior to his fall he has no ischemic symptoms, no angina, able to do all his ADLs work at full capacity without any issues.   - Remains stable, euvolemic, no chest pain   Prior CVA-no residual deficits, able to work/do all his ADLs prior to hospitalization.  PT recommends CIR, assessment pending   Chronic systolic CHF -most recent 2D echo March 2023 LVEF 25-30%, no WMA, grade 1 diastolic dysfunction, RV size and systolic function were normal. Back on Entresto  since yesterday, increase coreg  dose today to home dose   HTN - continue Entresto , Coreg    HLD -continue home medications   CKD 3 A -baseline creatinine 1.1-1.4, at baseline today    DM 2, poorly controlled-placed on glargine/sliding  scale  CBG (last 3)  Recent Labs    09/30/23 2108 10/01/23 0641 10/01/23 1116  GLUCAP 178* 137* 196*   Lab Results  Component Value Date   HGBA1C 8.1 (A) 07/08/2023    Scheduled Meds:  apixaban   5 mg Oral BID   carvedilol   12.5 mg Oral BID WC   dapagliflozin  propanediol  10 mg Oral Daily   ezetimibe   10 mg Oral Daily   feeding supplement  237 mL Oral BID BM   insulin  aspart  0-5 Units Subcutaneous QHS   insulin  aspart  0-9 Units Subcutaneous TID WC   insulin  glargine  20 Units Subcutaneous Daily   multivitamin with minerals  1 tablet Oral Daily   polyethylene glycol  17 g Oral BID   senna-docusate  2 tablet Oral BID   spironolactone   12.5 mg Oral Daily   Continuous Infusions: PRN Meds:.acetaminophen , bisacodyl , HYDROcodone -acetaminophen , methocarbamol, morphine  injection, ondansetron  **OR** ondansetron  (ZOFRAN ) IV, polyethylene glycol, zolpidem   Current Outpatient Medications  Medication Instructions   acetaminophen  (TYLENOL ) 500 mg, Oral, Every 6 hours PRN   Aspirin -Caffeine (BC FAST PAIN RELIEF PO) 1 Package, Daily PRN   carvedilol  (COREG ) 12.5 MG tablet TAKE ONE TABLET BY MOUTH TWICE A DAY WITH A MEAL   chlorhexidine  (HIBICLENS ) 4 % external liquid 1 Application, Topical, As directed, Use as directed daily for 5 days every other week for 6 weeks.   dapagliflozin  propanediol (FARXIGA ) 10 mg, Oral, Daily   diclofenac  Sodium (VOLTAREN ) 1 % GEL Rub a small grape sized dollop into knee up to 3 times daily as needed.  diphenhydrAMINE HCl, Sleep, (ZZZQUIL PO) 1 Dose, At bedtime PRN   Eliquis  5 mg, Oral, 2 times daily   Evolocumab  (REPATHA  SURECLICK) 140 MG/ML SOAJ INJECT 1 MILLILITER UNDER THE SKIN EVERY 14 DAYS   ezetimibe  (ZETIA ) 10 mg, Oral, Daily   glipiZIDE  (GLUCOTROL  XL) 10 mg, Oral, Daily with breakfast   HYDROcodone -acetaminophen  (NORCO/VICODIN) 5-325 MG tablet 1-2 tablets, Oral, Every 6 hours PRN   Lantus  SoloStar 45 Units, Subcutaneous, BH-each morning, And pen  needles 1/day   methocarbamol (ROBAXIN) 500 mg, Oral, Every 6 hours PRN   mupirocin  ointment (BACTROBAN ) 2 % 1 Application, Nasal, 2 times daily, Use as directed 2 times daily for 5 days every other week for 6 weeks.   sacubitril-valsartan (ENTRESTO ) 24-26 MG 1 tablet, Oral, 2 times daily   sildenafil  (VIAGRA ) 50-100 mg, Oral, Daily PRN   spironolactone  (ALDACTONE ) 12.5 mg, Oral, Daily   tirzepatide  (MOUNJARO ) 12.5 mg, Subcutaneous, Weekly    Diet Orders (From admission, onward)     Start     Ordered   09/27/23 1734  Diet regular Room service appropriate? Yes; Fluid consistency: Thin  Diet effective now       Question Answer Comment  Room service appropriate? Yes   Fluid consistency: Thin      09/27/23 1733            DVT prophylaxis: SCDs Start: 09/27/23 1734 apixaban  (ELIQUIS ) tablet 5 mg   Lab Results  Component Value Date   PLT 336 10/01/2023      Code Status: Full Code  Family Communication: No family at bedside  Status is: Inpatient Remains inpatient appropriate because: Severity of illness   Level of care: Telemetry Medical  Consultants:  Orthopedic surgery  Objective: Vitals:   09/30/23 0512 09/30/23 0900 10/01/23 0548 10/01/23 0720  BP: 121/85 136/88 110/84 120/75  Pulse: 95 97 (!) 112   Resp: 16   16  Temp: 99.5 F (37.5 C) 98.3 F (36.8 C) 98.4 F (36.9 C) (!) 97.5 F (36.4 C)  TempSrc: Oral Oral Oral Oral  SpO2: 98% 100% 97% 97%  Weight:      Height:        Intake/Output Summary (Last 24 hours) at 10/01/2023 1300 Last data filed at 10/01/2023 1051 Gross per 24 hour  Intake 600 ml  Output 850 ml  Net -250 ml   Wt Readings from Last 3 Encounters:  09/27/23 74.8 kg  07/08/23 76.4 kg  05/13/23 77.3 kg    Examination:  Constitutional: NAD Eyes: lids and conjunctivae normal, no scleral icterus ENMT: mmm Neck: normal, supple Respiratory: clear to auscultation bilaterally, no wheezing, no crackles.  Cardiovascular: Regular rate and  rhythm, no murmurs / rubs / gallops. No LE edema. Abdomen: soft, no distention, no tenderness. Bowel sounds positive.   Data Reviewed: I have independently reviewed following labs and imaging studies   CBC Recent Labs  Lab 09/27/23 0604 09/28/23 0557 09/29/23 0539 10/01/23 0456  WBC 14.1* 14.5* 13.7* 14.5*  HGB 15.6 14.8 13.2 11.7*  HCT 46.4 43.2 37.6* 33.3*  PLT 336 327 287 336  MCV 94.3 91.7 91.5 91.0  MCH 31.7 31.4 32.1 32.0  MCHC 33.6 34.3 35.1 35.1  RDW 12.7 12.9 12.7 12.6  LYMPHSABS 1.6  --   --   --   MONOABS 0.8  --   --   --   EOSABS 0.2  --   --   --   BASOSABS 0.1  --   --   --  Recent Labs  Lab 09/27/23 0604 09/28/23 0557 09/29/23 0539 10/01/23 0456  NA 137 136 135 131*  K 4.5 3.9 4.0 4.1  CL 105 102 103 98  CO2 19* 21* 24 22  GLUCOSE 165* 165* 155* 117*  BUN 27* 24* 30* 27*  CREATININE 1.41* 1.36* 1.35* 1.22  CALCIUM  8.8* 8.6* 8.5* 8.4*  AST  --  23 27 26   ALT  --  19 14 14   ALKPHOS  --  54 48 46  BILITOT  --  0.9 0.8 1.0  ALBUMIN  --  3.4* 3.1* 2.9*  MG  --  2.0 2.0 2.0  INR 1.1  --   --   --     ------------------------------------------------------------------------------------------------------------------ No results for input(s): CHOL, HDL, LDLCALC, TRIG, CHOLHDL, LDLDIRECT in the last 72 hours.  Lab Results  Component Value Date   HGBA1C 8.1 (A) 07/08/2023   ------------------------------------------------------------------------------------------------------------------ No results for input(s): TSH, T4TOTAL, T3FREE, THYROIDAB in the last 72 hours.  Invalid input(s): FREET3  Cardiac Enzymes No results for input(s): CKMB, TROPONINI, MYOGLOBIN in the last 168 hours.  Invalid input(s): CK ------------------------------------------------------------------------------------------------------------------    Component Value Date/Time   BNP 455.8 (H) 10/18/2020 0916    CBG: Recent Labs  Lab  09/30/23 1120 09/30/23 1635 09/30/23 2108 10/01/23 0641 10/01/23 1116  GLUCAP 94 123* 178* 137* 196*    Recent Results (from the past 240 hours)  Surgical pcr screen     Status: Abnormal   Collection Time: 09/27/23  9:56 AM   Specimen: Nasal Mucosa; Nasal Swab  Result Value Ref Range Status   MRSA, PCR NEGATIVE NEGATIVE Final   Staphylococcus aureus POSITIVE (A) NEGATIVE Final    Comment: (NOTE) The Xpert SA Assay (FDA approved for NASAL specimens in patients 12 years of age and older), is one component of a comprehensive surveillance program. It is not intended to diagnose infection nor to guide or monitor treatment. Performed at [facility], [address], [place], [place] [address]      Radiology Studies: No results found.    Nilda Fendt, MD, PhD [facility]  Between 7 am - 7 pm I am available, please contact me via Amion (for emergencies) or Securechat (non urgent messages)  Between 7 pm - 7 am I am not available, please contact night coverage MD/APP via Amion

## 2023-10-01 NOTE — Plan of Care (Signed)

## 2023-10-01 NOTE — Progress Notes (Signed)
 Physical Therapy Treatment Patient Details Name: John [name] MRN: [id number] DOB: [birth year] Today's Date: 10/01/2023   History of Present Illness [name] is a 63 y.o. male admitted 09/27/23 after ground-level fall sustaining right displaced intertrochanteric hip fracture. Pt s/p IM rod 9/26. PMHx: CAD, ischemic cardiomyopathy, CVA, HTN, HLD, T2DM,  arthritis, and chronic systolic CHF with an ICD implant.    PT Comments  Pt resting in bed on arrival, agreeable to session, however limited by RLE pain despite pre-medication, limiting weight bearing and standing tolerance. Pt able to come to sitting EOB with mod A to manage RLE and elevate trunk with cues for sequencing and increased time needed for pt to complete. Pt requiring min A to boost to stand with cues for optimal hand placement. Pt gait distance limited by RLE and BUE pain with hands on assist needed to advance RLE through swing phase. Pt performing seated LE exercises for increased ROM and strength maintenance. Patient will benefit from intensive inpatient follow-up therapy, >3 hours/day to address deficits and maximize functional independence and decrease caregiver burden. Will continue to follow acutely.     If plan is discharge home, recommend the following: Two people to help with walking and/or transfers;A lot of help with bathing/dressing/bathroom;Assistance with cooking/housework;Assist for transportation;Help with stairs or ramp for entrance   Can travel by private vehicle        Equipment Recommendations  None recommended by PT (Pt already has DME)    Recommendations for Other Services       Precautions / Restrictions Precautions Precautions: Fall Recall of Precautions/Restrictions: Impaired Restrictions Weight Bearing Restrictions Per Provider Order: No RLE Weight Bearing Per Provider Order: Weight bearing as tolerated Other Position/Activity Restrictions: Unrestricted RLE ROM     Mobility  Bed  Mobility Overal bed mobility: Needs Assistance Bed Mobility: Supine to Sit     Supine to sit: Mod assist     General bed mobility comments: Assist with advancing RLE off of the bed and then bringing trunk up, increased time needed to complete    Transfers Overall transfer level: Needs assistance Equipment used: Rolling walker (2 wheels) Transfers: Sit to/from Stand Sit to Stand: From elevated surface, Min assist           General transfer comment: EOB elevated slighty, cues for hand placement and to utilize palm of hands on RW and not inbetween fingers, min A to rise to standing with increased time    Ambulation/Gait Ambulation/Gait assistance: Min assist Gait Distance (Feet): 5 Feet Assistive device: Rolling walker (2 wheels) Gait Pattern/deviations: Step-to pattern, Decreased step length - right, Decreased step length - left, Decreased stance time - right, Decreased weight shift to right, Knee flexed in stance - right, Antalgic, Trunk flexed Gait velocity: decr     General Gait Details: Pt took short slow labored steps with heavy reliance on BUE support on RW. Pt unable to clear R foot from ground, toes dragging. Assist by PTA to advance RLE. He self-limited WBing on RLE d/t pain. Unable to extend R knee with pt unable to acheiev foot flat. Cues for sequencing. Assist to manuever RW for safety/stability. Unsteady,  but no LOB.   Stairs             Wheelchair Mobility     Tilt Bed    Modified Rankin (Stroke Patients Only)       Balance Overall balance assessment: Needs assistance Sitting-balance support: Bilateral upper extremity supported, Feet supported Sitting balance-Leahy  Scale: Poor Sitting balance - Comments: Increased right lean in sitting, needing UE support to maintain   Standing balance support: Bilateral upper extremity supported, During functional activity, Reliant on assistive device for balance Standing balance-Leahy Scale: Poor Standing  balance comment: min-mod assist for mobility with reliance on RW for support to take steps.                            Communication Communication Communication: Impaired Factors Affecting Communication: Hearing impaired  Cognition Arousal: Alert Behavior During Therapy: WFL for tasks assessed/performed   PT - Cognitive impairments: No apparent impairments                         Following commands: Intact      Cueing Cueing Techniques: Verbal cues  Exercises General Exercises - Lower Extremity Ankle Circles/Pumps: AROM, Both, 10 reps Quad Sets: AROM, Right, 10 reps Heel Slides: AROM, Right, 5 reps    General Comments General comments (skin integrity, edema, etc.): Pt with RLE edema at hip/knee.      Pertinent Vitals/Pain Pain Assessment Pain Assessment: Faces Faces Pain Scale: Hurts whole lot Pain Location: R hip and knee, BUEs with weight bearing Pain Descriptors / Indicators: Operative site guarding, Grimacing, Moaning, Aching, Discomfort, Sharp Pain Intervention(s): Premedicated before session, Monitored during session, Limited activity within patient's tolerance    Home Living                          Prior Function            PT Goals (current goals can now be found in the care plan section) Acute Rehab PT Goals Patient Stated Goal: Have less pain PT Goal Formulation: With patient Time For Goal Achievement: 10/12/23 Progress towards PT goals: Progressing toward goals (slowly)    Frequency    Min 3X/week      PT Plan      Co-evaluation              AM-PAC PT 6 Clicks Mobility   Outcome Measure  Help needed turning from your back to your side while in a flat bed without using bedrails?: A Lot Help needed moving from lying on your back to sitting on the side of a flat bed without using bedrails?: A Lot Help needed moving to and from a bed to a chair (including a wheelchair)?: A Lot Help needed standing up  from a chair using your arms (e.g., wheelchair or bedside chair)?: A Lot Help needed to walk in hospital room?: Total (<20') Help needed climbing 3-5 steps with a railing? : Total 6 Click Score: 10    End of Session Equipment Utilized During Treatment: Gait belt Activity Tolerance: Patient limited by pain Patient left: in chair;with call bell/phone within reach Nurse Communication: Mobility status PT Visit Diagnosis: Pain;Difficulty in walking, not elsewhere classified (R26.2);Other abnormalities of gait and mobility (R26.89);Unsteadiness on feet (R26.81) Pain - Right/Left: Right Pain - part of body: Hip;Knee     Time: 8866-8851 PT Time Calculation (min) (ACUTE ONLY): 15 min  Charges:    $Therapeutic Activity: 8-22 mins PT General Charges $$ ACUTE PT VISIT: 1 Visit                     Kymari Nuon R. PTA Acute Rehabilitation Services Office: [phone]   Therisa CHRISTELLA Boor 10/01/2023, 1:38 PM

## 2023-10-01 NOTE — Treatment Plan (Signed)
 Occupational Therapy Treatment Patient Details Name: John [name] MRN: [id number] DOB: 02-28-1960 Today's Date: 10/01/2023   History of present illness 63 y.o. male admitted 09/27/23 after ground-level fall sustaining right displaced intertrochanteric hip fracture. Pt s/p IM rod 9/26. PMHx: CAD, ischemic cardiomyopathy, CVA, HTN, HLD, T2DM,  arthritis, and chronic systolic CHF with an ICD implant.   OT comments  Pt with good return demo for first attempt at LB dressing with AE. Pt currently able to don underwear. Pt educated on safety with transfer from chair and power up with proper hand placement on the RW. Recommendation for Patient will benefit from intensive inpatient follow-up therapy, >3 hours/day       If plan is discharge home, recommend the following:  A lot of help with walking and/or transfers;A lot of help with bathing/dressing/bathroom;Direct supervision/assist for financial management;Direct supervision/assist for medications management   Equipment Recommendations  BSC/3in1;Other (comment);Wheelchair (measurements OT);Wheelchair cushion (measurements OT) (RW,)    Recommendations for Other Services Rehab consult    Precautions / Restrictions Precautions Precautions: Fall Recall of Precautions/Restrictions: Impaired Restrictions Weight Bearing Restrictions Per Provider Order: No RLE Weight Bearing Per Provider Order: Weight bearing as tolerated Other Position/Activity Restrictions: Unrestricted RLE ROM       Mobility Bed Mobility               General bed mobility comments: in chair on arrival    Transfers Overall transfer level: Needs assistance Equipment used: Rolling walker (2 wheels) Transfers: Sit to/from Stand Sit to Stand: Mod assist           General transfer comment: cues to scoot foward in teh chair with increased time to complete. pt requires heavy use of bil UE on chair arm rest. pt needs cues to anterior weight shift to achieve  standing. pt once in standing with cues for posture. pt with return to chair educated not to grab the middle of the RW and to reach for the chair arm rest. pt is able to use bil UE to lower to chair surface     Balance Overall balance assessment: Needs assistance Sitting-balance support: Bilateral upper extremity supported, Feet supported Sitting balance-Leahy Scale: Poor     Standing balance support: Bilateral upper extremity supported, During functional activity, Reliant on assistive device for balance Standing balance-Leahy Scale: Poor                             ADL either performed or assessed with clinical judgement   ADL Overall ADL's : Needs assistance/impaired Eating/Feeding: Independent;Sitting Eating/Feeding Details (indicate cue type and reason): drinking and pouring soda into a cup                 Lower Body Dressing: Sit to/from stand;With adaptive equipment;Cueing for safety;Maximal assistance Lower Body Dressing Details (indicate cue type and reason): pt able to use reacher to initate don of underwear. pt putting them on backward initially. pt requires max (A) to power up from chair surface. pt once standing needs (A) to steady to pull up bottoms with LOB . pt fearful of falling. pt requires OT don 3/4 of the way to allow the patietn to backward chain completing the task.               General ADL Comments: sit<>stand x2 with focus on R knee extension, hip extension, full upright trunk and head up  ( posture). pt with good return demo with tactile  input and one step commands    Extremity/Trunk Assessment Upper Extremity Assessment Upper Extremity Assessment: Overall WFL for tasks assessed;Right hand dominant   Lower Extremity Assessment Lower Extremity Assessment: Defer to PT evaluation;RLE deficits/detail RLE Deficits / Details: edema noted at the knee and decreased knee extension compared to the L LE. pt reports hx of knee discomfort and need  for knee replacment        Vision   Vision Assessment?: No apparent visual deficits   Perception     Praxis     Communication Communication Communication: Impaired Factors Affecting Communication: Hearing impaired   Cognition Arousal: Alert Behavior During Therapy: WFL for tasks assessed/performed Cognition: No family/caregiver present to determine baseline             OT - Cognition Comments: appropriate for all aspects of session                 Following commands: Intact        Cueing   Cueing Techniques: Verbal cues, Tactile cues  Exercises Exercises: General Lower Extremity General Exercises - Lower Extremity Quad Sets: AROM, Right, 10 reps Gluteal Sets: AROM, Both, 5 reps Other Exercises Other Exercises: pt educated on sitting and doing quad sets and laying in the bed. pt encourage to work on knee extension and noted to have edema at the knee compared to L LE    Shoulder Instructions       General Comments pt reports new dressing applied today. pt noted to have area with dark drainage at the superior aspect of the bandage. no active drainage noted. ice applied for comfort    Pertinent Vitals/ Pain       Pain Assessment Pain Assessment: 0-10 Pain Score: 5  Pain Location: R hip Pain Descriptors / Indicators: Operative site guarding, Grimacing, Moaning, Aching, Discomfort, Sharp Pain Intervention(s): Monitored during session, Limited activity within patient's tolerance, Repositioned (Requested pain medication prior to session. Requested pain medication during session. RN arriving to give medication at the end of session. Pt advised to ask about medication options as pt first choice was morphine  since he had recieved that previously)  Home Living                                          Prior Functioning/Environment              Frequency  Min 2X/week        Progress Toward Goals  OT Goals(current goals can now be  found in the care plan section)  Progress towards OT goals: Progressing toward goals  ADL Goals Pt Will Perform Grooming: with min assist;standing Pt Will Perform Lower Body Bathing: with min assist;sit to/from stand;with adaptive equipment Pt Will Perform Lower Body Dressing: with min assist;sit to/from stand;with adaptive equipment Pt Will Transfer to Toilet: with min assist;ambulating;bedside commode Pt Will Perform Toileting - Clothing Manipulation and hygiene: with min assist;sit to/from stand Additional ADL Goal #1: Pt will transfer from supine to sit EOB with min assist in preparation for selfcare tasks or transfers.  Plan      Co-evaluation                 AM-PAC OT 6 Clicks Daily Activity     Outcome Measure   Help from another person eating meals?: None Help from another person taking care of personal grooming?: A Little  Help from another person toileting, which includes using toliet, bedpan, or urinal?: A Lot Help from another person bathing (including washing, rinsing, drying)?: A Lot Help from another person to put on and taking off regular upper body clothing?: A Little Help from another person to put on and taking off regular lower body clothing?: A Lot 6 Click Score: 16    End of Session Equipment Utilized During Treatment: Rolling walker (2 wheels)  OT Visit Diagnosis: Unsteadiness on feet (R26.81);Other abnormalities of gait and mobility (R26.89);Muscle weakness (generalized) (M62.81);Pain Pain - Right/Left: Right Pain - part of body: Hip   Activity Tolerance Patient tolerated treatment well   Patient Left in chair;with call bell/phone within reach;with chair alarm set;with nursing/sitter in room   Nurse Communication Mobility status;Precautions        Time: 8664-8595 OT Time Calculation (min): 29 min  Charges: OT General Charges $OT Visit: 1 Visit OT Treatments $Self Care/Home Management : 23-37 mins   Brynn, OTR/L  Acute Rehabilitation  Services Office: [phone] .   Ely Molt 10/01/2023, 2:28 PM

## 2023-10-02 ENCOUNTER — Inpatient Hospital Stay (HOSPITAL_COMMUNITY)

## 2023-10-02 LAB — CBC
HCT: 28.1 % — ABNORMAL LOW (ref 39.0–52.0)
Hemoglobin: 9.8 g/dL — ABNORMAL LOW (ref 13.0–17.0)
MCH: 31.9 pg (ref 26.0–34.0)
MCHC: 34.9 g/dL (ref 30.0–36.0)
MCV: 91.5 fL (ref 80.0–100.0)
Platelets: 313 K/uL (ref 150–400)
RBC: 3.07 MIL/uL — ABNORMAL LOW (ref 4.22–5.81)
RDW: 12.5 % (ref 11.5–15.5)
WBC: 10.5 K/uL (ref 4.0–10.5)
nRBC: 0 % (ref 0.0–0.2)

## 2023-10-02 LAB — BASIC METABOLIC PANEL WITH GFR
Anion gap: 11 (ref 5–15)
BUN: 34 mg/dL — ABNORMAL HIGH (ref 8–23)
CO2: 24 mmol/L (ref 22–32)
Calcium: 8.4 mg/dL — ABNORMAL LOW (ref 8.9–10.3)
Chloride: 97 mmol/L — ABNORMAL LOW (ref 98–111)
Creatinine, Ser: 1.24 mg/dL (ref 0.61–1.24)
GFR, Estimated: >60 mL/min (ref >60)
Glucose, Bld: 208 mg/dL — ABNORMAL HIGH (ref 70–99)
Potassium: 4.2 mmol/L (ref 3.5–5.1)
Sodium: 132 mmol/L — ABNORMAL LOW (ref 135–145)

## 2023-10-02 LAB — MAGNESIUM: Magnesium: 2.2 mg/dL (ref 1.7–2.4)

## 2023-10-02 LAB — GLUCOSE, CAPILLARY
Glucose-Capillary: 185 mg/dL — ABNORMAL HIGH (ref 70–99)
Glucose-Capillary: 297 mg/dL — ABNORMAL HIGH (ref 70–99)
Glucose-Capillary: 236 mg/dL — ABNORMAL HIGH (ref 70–99)
Glucose-Capillary: 195 mg/dL — ABNORMAL HIGH (ref 70–99)

## 2023-10-02 MED ORDER — IOHEXOL 350 MG/ML SOLN
75.0000 mL | Freq: Once | INTRAVENOUS | Status: AC | PRN
Start: 2023-10-02 — End: 2023-10-02
  Administered 2023-10-02: 75 mL via INTRAVENOUS

## 2023-10-02 MED ORDER — MAGNESIUM HYDROXIDE 400 MG/5ML PO SUSP: 15.0000 mL | Freq: Every day | ORAL | Status: DC | PRN

## 2023-10-02 MED ORDER — GLUCERNA SHAKE PO LIQD: 237.0000 mL | Freq: Three times a day (TID) | ORAL | Status: DC

## 2023-10-02 NOTE — Progress Notes (Signed)
 Dressing to Rt hip changed and re-enforced d/t bleeding, Outgoing nurse reported that it was bleeding earlier and MD was made aware and stated to apply pressure dressing. ICE pack also placed on hip. Patient VS remains stable.

## 2023-10-02 NOTE — Inpatient Diabetes Management (Addendum)
 Inpatient Diabetes Program Recommendations  AACE/ADA: New Consensus Statement on Inpatient Glycemic Control (2015)  Target Ranges:  Prepandial:   less than 140 mg/dL      Peak postprandial:   less than 180 mg/dL (1-2 hours)      Critically ill patients:  140 - 180 mg/dL   Lab Results  Component Value Date   GLUCAP 297 (H) 10/02/2023   HGBA1C 8.1 (A) 07/08/2023    Review of Glycemic Control  Latest Reference Range & Units 10/01/23 06:41 10/01/23 11:16 10/01/23 16:11 10/01/23 21:04 10/02/23 06:21 10/02/23 13:05  Glucose-Capillary 70 - 99 mg/dL 862 (H) 803 (H) 747 (H) 193 (H) 185 (H) 297 (H)  (H): Data is abnormally high  Diabetes history: DM2 Outpatient Diabetes medications: Lantus  40 at bedtime, Mounjaro  12.5 weekly, Glipizide  10 every day, Farxiga  10 every day  Current orders for Inpatient glycemic control: Lantus  20 units every day, Novolog  0-9 units TID and 0-5 units at bedtime, Farxiga  10 mg QD  Inpatient Diabetes Program Recommendations:    Post prandials elevated.  Might consider:  Novolog  2 units TID with meals if he consumes at least 50%.    Thank you, Wyvonna Pinal, MSN, CDCES Diabetes Coordinator Inpatient Diabetes Program [phone] (team pager from 8a-5p)

## 2023-10-02 NOTE — Progress Notes (Addendum)
 Orthopaedic Progress Note  S: Patient reports continued pain in his right hip.  He is concerned by continued drainage from the incision.  O:  Vitals:   10/02/23 0518 10/02/23 0741  BP: 111/79 121/75  Pulse: (!) 101 98  Resp: 18 16  Temp: 98.5 F (36.9 C) 98.2 F (36.8 C)  SpO2: 97% 97%    Continued bloody drainage seen on the dressings of the right hip.  This is significant drainage from yesterday evening when the dressing was applied.  His thigh is soft and compressible.  No evidence of erythema or purulent drainage.  Compression of the wound does not produce any active drainage.  His calf is soft.  He has 5 5 strength EHL FHL gastrocs and tibialis anterior    Labs:  Results for orders placed or performed during the hospital encounter of 09/27/23 (from the past 24 hours)  Glucose, capillary     Status: Abnormal   Collection Time: 10/01/23 11:16 AM  Result Value Ref Range   Glucose-Capillary 196 (H) 70 - 99 mg/dL  Glucose, capillary     Status: Abnormal   Collection Time: 10/01/23  4:11 PM  Result Value Ref Range   Glucose-Capillary 252 (H) 70 - 99 mg/dL  Glucose, capillary     Status: Abnormal   Collection Time: 10/01/23  9:04 PM  Result Value Ref Range   Glucose-Capillary 193 (H) 70 - 99 mg/dL  Basic metabolic panel with GFR     Status: Abnormal   Collection Time: 10/02/23  5:20 AM  Result Value Ref Range   Sodium 132 (L) 135 - 145 mmol/L   Potassium 4.2 3.5 - 5.1 mmol/L   Chloride 97 (L) 98 - 111 mmol/L   CO2 24 22 - 32 mmol/L   Glucose, Bld 208 (H) 70 - 99 mg/dL   BUN 34 (H) 8 - 23 mg/dL   Creatinine, Ser 8.75 0.61 - 1.24 mg/dL   Calcium  8.4 (L) 8.9 - 10.3 mg/dL   GFR, Estimated >39 >39 mL/min   Anion gap 11 5 - 15  CBC     Status: Abnormal   Collection Time: 10/02/23  5:20 AM  Result Value Ref Range   WBC 10.5 4.0 - 10.5 K/uL   RBC 3.07 (L) 4.22 - 5.81 MIL/uL   Hemoglobin 9.8 (L) 13.0 - 17.0 g/dL   HCT 71.8 (L) 60.9 - 47.9 %   MCV 91.5 80.0 - 100.0 fL   MCH  31.9 26.0 - 34.0 pg   MCHC 34.9 30.0 - 36.0 g/dL   RDW 87.4 88.4 - 84.4 %   Platelets 313 150 - 400 K/uL   nRBC 0.0 0.0 - 0.2 %  Magnesium     Status: None   Collection Time: 10/02/23  5:20 AM  Result Value Ref Range   Magnesium 2.2 1.7 - 2.4 mg/dL  Glucose, capillary     Status: Abnormal   Collection Time: 10/02/23  6:21 AM  Result Value Ref Range   Glucose-Capillary 185 (H) 70 - 99 mg/dL    Assessment: Status post IM rod right femur  I the patient continues to have significant drainage from his right hip.  His H&H has lowered over the past several days.  Likely this is due to the drainage from his hip.  At this point we can hold the Eliquis  until we can get the bleeding to stop.  I have asked nurse to apply a more true compression dressing using a flexible tape or we can  get a good compression across the incision to help with the healing.  The Tegaderm dressings were then applied really not providing any significant compression to the wound.  I am also going to order a CT scan to make sure is no active source of the bleeding.   Cordella Rhein, MD, MS [facility] [phone]    In lieu of a pressure dressing, we decided to place an incisional wound VAC.  This was applied over the incision without difficulty and is holding good suction.  I did review the CT scan which shows a hematoma but to my review no active bleeding.  However the radiology report is not yet complete.  Will await the result of the port.  Otherwise we will continue to observe the incisional wound VAC and hold the patient's Eliquis  for the time being

## 2023-10-02 NOTE — Progress Notes (Signed)
 Inpatient Rehab Admissions Coordinator:   CIR authorization from worker's comp in progress.  Will follow.   Reche Lowers, PT, DPT Admissions Coordinator [phone] 10/02/23  10:25 AM

## 2023-10-02 NOTE — Consult Note (Signed)
 WOC contacted by bedside nursing, orthopedics request incision NPWT to hip wound.   FIXATION, FRACTURE, INTERTROCHANTERIC, WITH INTRAMEDULLARY ROD 9/26 per Dr. Reyne.    I have provided nursing with guidance for Prevena vs Prevena Plus.  Need for hospital unit if desired to hook to while inpatient to save Prevena unit for DC.   Beside nursing or surgery would need to obtain from OR and surgeon will need to place. WOC nursing team does not maintain skill set for use of Prevena, since they are routinely placed in the OR.   Kennith Morss [place], CNS, CWON-AP [phone]

## 2023-10-02 NOTE — Progress Notes (Addendum)
 Progress Note   Patient: John [name] FMW:[id number] DOB: 23-May-1960 DOA: 09/27/2023     5 DOS: the patient was seen and examined on 10/02/2023   Brief hospital course:  63 y.o. male with medical history significant of CAD, ischemic cardiomyopathy managed medically, prior CVA, HTN, HLD, DM 2, chronic systolic CHF with an ICD implant comes into the hospital after ground-level fall.  He works nights, at [employer], was pulling on a heavy object then lost grip and balance and landed on his back. Following that, he experienced right hip pain, was unable to walk and came to the hospital.  He was found to have a hip fracture, orthopedic surgery was consulted and he was admitted to the hospitalist service.   Assessment and Plan:  Principle problem: Right hip fracture  --Orthopedic surgery consulted --status post intertrochanteric fixation with IM rod on 9/26 with Dr. Reyne  --Hold Eliquis   --Pain control  --Bowel regimen --PT recommends CIR, formal assessment pending, also insurance auth (worker's comp)    Active problems: Acute blood loss anemia -- Hbg down from 11.7 >> 9.8 this AM, with persistent bleeding from the right hip. --Hold Eliquis  --CT scan pending to evaluate --Trend Hbg and transfuse < 8 given cardiac history  CAD, ischemic cardiomyopathy -prior to his fall he has no ischemic symptoms, no angina, able to do all his ADLs work at full capacity without any issues.   - Remains stable, euvolemic, no chest pain   Prior CVA-no residual deficits, able to work/do all his ADLs prior to hospitalization.   --PT recommends CIR, assessment pending   Chronic systolic CHF -most recent 2D echo March 2023 LVEF 25-30%, no WMA, grade 1 diastolic dysfunction, RV size and systolic function were normal.  --Resumed on Entresto  and Coreg    HTN - continue Entresto , Coreg    HLD -continue home medications   CKD 3 A -baseline creatinine 1.1-1.4, at baseline today    DM 2, poorly  controlled-placed on glargine/sliding scale      Subjective: Pt seen this AM, awake resting in bed, waiting to go down to CT scan to assess for any internal bleeding.  Besides pain and ongoing issues with bleeding in his hip, pt denies other issues or complaints.     Physical Exam: Vitals:   10/01/23 1339 10/02/23 0022 10/02/23 0518 10/02/23 0741  BP: 116/83 108/75 111/79 121/75  Pulse:  92 (!) 101 98  Resp:  18 18 16   Temp: 98 F (36.7 C) 98.4 F (36.9 C) 98.5 F (36.9 C) 98.2 F (36.8 C)  TempSrc: Oral Oral Oral Oral  SpO2: 99% 98% 97% 97%  Weight:      Height:       General exam: awake, alert, no acute distress HEENT: moist mucus membranes, hearing grossly normal  Respiratory system: CTAB, no wheezes, rales or rhonchi, normal respiratory effort. Cardiovascular system: normal S1/S2, RRR,  no pedal edema.   Gastrointestinal system: soft, NT, ND Central nervous system: A&O x 3. no gross focal neurologic deficits, normal speech Extremities: bloody draining visible on right hip dressing, no surrounding erythema Skin: dry, intact, normal temperature Psychiatry: normal mood, congruent affect, judgement and insight appear normal    Data Reviewed:  Notable labs -- Na 132, Cl 97, glucose 208, Ca 8.4, Hbg 11.7 >> 9.8   Family Communication: None present. Pt updated.  Disposition: Status is: Inpatient Remains inpatient appropriate because: ongoing evaluation of post-op bleeding from right hip   Planned Discharge Destination: Acute Inpatient Rehab  Time spent:  42 minutes  Author: Burnard DELENA Cunning, DO 10/02/2023 1:31 PM  For on call review [email or web address].

## 2023-10-02 NOTE — Progress Notes (Signed)
 Nutrition Follow Up  DOCUMENTATION CODES:   Not applicable  INTERVENTION:  Encourage PO intake - On regular diet with thin liquids  Emphasis on protein intake for post op healing  Switched from Ensure to Glucerna since blood sugars not well managed and pt eating well; Glucerna Shake po TID, each supplement provides 220 kcal and 10 grams of protein  MVI with minerals daily   NUTRITION DIAGNOSIS:   Increased nutrient needs related to post-op healing as evidenced by estimated needs. Remains applicable  GOAL:   Patient will meet greater than or equal to 90% of their needs Progressing  MONITOR:   PO intake, Supplement acceptance  REASON FOR ASSESSMENT:   Consult Hip fracture protocol  ASSESSMENT:   63 y.o. male with medical history significant of CAD, ischemic cardiomyopathy, prior CVA, HTN, HLD, DM 2,CHF with an ICD implant comes into the hospital after ground-level fall. Found to have right hip fracture s/p fracture fixation with intramedullary rod 9/26.  9/26 - s/p  fracture fixation with intramedullary rod on right hip    Spoke with pt who was resting in bed. Pt complains of pain at time but RN working on getting pt some medication. Pt reports good appetite and endorses liking the food he has been ordering. Pt continues to drink Ensure shakes and agreeable to continue ONS, will transition to Glucerna as blood sugars not well managed and pt eating 100% of meals. Pt agreeable and states he likes vanilla flavor for most things so he will drink anything similar.   Pt eating 100% of last 7 meals. Conducted physical exam which shows some mild lower extremity muscle depletions, but pt reports he is not as active since he has bad knees, which is suggestive that mild depletions seen may be related to some atrophy. Pt appears well nourished and states he eats well at home and always has a good appetite. Pt being evaluated for CIR to work on mobility following hip surgery. Continued to  stress the importance of protein intake and adequate nutrition while recovering from surgery and participating in rehab program as well as preventing muscle loss while on Mounjaro  for blood sugar management.    Admit weight: 76 kg Current weight: 74.8 kg   Average Meal Intake: 9/27-10/1: 100% average intake x 7 recorded meals  Nutritionally Relevant Medications: Ensure BID SSI 0-5 daily SSI 0-9 TID Lantus  20 units daily MVI w/ minerals daily Miralax  BID Senna BID  Labs Reviewed: Sodium 132/Chloride 97 BUN 34 Hgb 9.8 CBG ranges from 185-252 mg/dL over the last 24 hours HgbA1c 8.1  NUTRITION - FOCUSED PHYSICAL EXAM:  Flowsheet Row Most Recent Value  Orbital Region No depletion  Upper Arm Region No depletion  Thoracic and Lumbar Region Mild depletion  Buccal Region No depletion  Temple Region Mild depletion  Clavicle Bone Region No depletion  Clavicle and Acromion Bone Region No depletion  Scapular Bone Region No depletion  Dorsal Hand Mild depletion  Patellar Region Mild depletion  Anterior Thigh Region Mild depletion  Posterior Calf Region Mild depletion  Edema (RD Assessment) None  Hair Reviewed  Eyes Reviewed  Mouth Reviewed  Skin Reviewed  Nails Reviewed     Diet Order:   Diet Order             Diet regular Room service appropriate? Yes; Fluid consistency: Thin  Diet effective now                   EDUCATION NEEDS:  Not appropriate for education at this time  Skin:  Skin Assessment: Skin Integrity Issues: Skin Integrity Issues:: Incisions Incisions: Closed surgical incision right hip  Last BM:  9/26  Height:   Ht Readings from Last 1 Encounters:  09/27/23 5' 7 (1.702 m)    Weight:   Wt Readings from Last 1 Encounters:  09/27/23 74.8 kg    Ideal Body Weight:  67.3 kg  BMI:  Body mass index is 25.84 kg/m.  Estimated Nutritional Needs:   Kcal:  2000-2200 kcal  Protein:  100-120 gm  Fluid:  >2L/day   Josette Glance,  MS, RDN, LDN Clinical Dietitian I Please reach out via secure chat

## 2023-10-03 DIAGNOSIS — D62 Acute posthemorrhagic anemia: Secondary | ICD-10-CM | POA: Diagnosis not present

## 2023-10-03 LAB — GLUCOSE, CAPILLARY
Glucose-Capillary: 206 mg/dL — ABNORMAL HIGH (ref 70–99)
Glucose-Capillary: 146 mg/dL — ABNORMAL HIGH (ref 70–99)
Glucose-Capillary: 232 mg/dL — ABNORMAL HIGH (ref 70–99)
Glucose-Capillary: 194 mg/dL — ABNORMAL HIGH (ref 70–99)
Glucose-Capillary: 242 mg/dL — ABNORMAL HIGH (ref 70–99)

## 2023-10-03 LAB — CBC
HCT: 26.2 % — ABNORMAL LOW (ref 39.0–52.0)
Hemoglobin: 8.8 g/dL — ABNORMAL LOW (ref 13.0–17.0)
MCH: 31 pg (ref 26.0–34.0)
MCHC: 33.6 g/dL (ref 30.0–36.0)
MCV: 92.3 fL (ref 80.0–100.0)
Platelets: 337 K/uL (ref 150–400)
RBC: 2.84 MIL/uL — ABNORMAL LOW (ref 4.22–5.81)
RDW: 12.4 % (ref 11.5–15.5)
WBC: 10.8 K/uL — ABNORMAL HIGH (ref 4.0–10.5)
nRBC: 0 % (ref 0.0–0.2)

## 2023-10-03 LAB — BASIC METABOLIC PANEL WITH GFR
Anion gap: 8 (ref 5–15)
BUN: 29 mg/dL — ABNORMAL HIGH (ref 8–23)
CO2: 24 mmol/L (ref 22–32)
Calcium: 8.3 mg/dL — ABNORMAL LOW (ref 8.9–10.3)
Chloride: 100 mmol/L (ref 98–111)
Creatinine, Ser: 1.02 mg/dL (ref 0.61–1.24)
GFR, Estimated: >60 mL/min (ref >60)
Glucose, Bld: 158 mg/dL — ABNORMAL HIGH (ref 70–99)
Potassium: 4 mmol/L (ref 3.5–5.1)
Sodium: 132 mmol/L — ABNORMAL LOW (ref 135–145)

## 2023-10-03 MED ORDER — GLIPIZIDE ER 10 MG PO TB24
10.0000 mg | ORAL_TABLET | Freq: Every day | ORAL | Status: DC
  Administered 2023-10-04: 10 mg via ORAL
  Filled 2023-10-03: qty 1

## 2023-10-03 NOTE — Progress Notes (Signed)
 Orthopaedic Progress Note  S: Patient is resting comfortably in bed.  No new complaints  O:  Vitals:   10/02/23 1412 10/02/23 2027  BP: 108/78 111/69  Pulse: 100 88  Resp: 16 17  Temp: 98 F (36.7 C) 98.5 F (36.9 C)  SpO2: 97% 97%    Back dressing is in place over the right incision.  There is bloody drainage seen underneath the sponge as well as within the container thigh is soft and compressible.  Neurovascular intact  Imaging: CT scan was reviewed last night and then the radiologist report was reviewed today.  This does show a hematoma in the surgical tract but no evidence of active bleeding  Labs:  Results for orders placed or performed during the hospital encounter of 09/27/23 (from the past 24 hours)  Glucose, capillary     Status: Abnormal   Collection Time: 10/02/23  1:05 PM  Result Value Ref Range   Glucose-Capillary 297 (H) 70 - 99 mg/dL  Glucose, capillary     Status: Abnormal   Collection Time: 10/02/23  4:51 PM  Result Value Ref Range   Glucose-Capillary 236 (H) 70 - 99 mg/dL  Glucose, capillary     Status: Abnormal   Collection Time: 10/02/23  9:00 PM  Result Value Ref Range   Glucose-Capillary 195 (H) 70 - 99 mg/dL  CBC     Status: Abnormal   Collection Time: 10/03/23  4:29 AM  Result Value Ref Range   WBC 10.8 (H) 4.0 - 10.5 K/uL   RBC 2.84 (L) 4.22 - 5.81 MIL/uL   Hemoglobin 8.8 (L) 13.0 - 17.0 g/dL   HCT 73.7 (L) 60.9 - 47.9 %   MCV 92.3 80.0 - 100.0 fL   MCH 31.0 26.0 - 34.0 pg   MCHC 33.6 30.0 - 36.0 g/dL   RDW 87.5 88.4 - 84.4 %   Platelets 337 150 - 400 K/uL   nRBC 0.0 0.0 - 0.2 %  Basic metabolic panel with GFR     Status: Abnormal   Collection Time: 10/03/23  4:29 AM  Result Value Ref Range   Sodium 132 (L) 135 - 145 mmol/L   Potassium 4.0 3.5 - 5.1 mmol/L   Chloride 100 98 - 111 mmol/L   CO2 24 22 - 32 mmol/L   Glucose, Bld 158 (H) 70 - 99 mg/dL   BUN 29 (H) 8 - 23 mg/dL   Creatinine, Ser 8.97 0.61 - 1.24 mg/dL   Calcium  8.3 (L) 8.9 -  10.3 mg/dL   GFR, Estimated >39 >39 mL/min   Anion gap 8 5 - 15  Glucose, capillary     Status: Abnormal   Collection Time: 10/03/23  6:08 AM  Result Value Ref Range   Glucose-Capillary 206 (H) 70 - 99 mg/dL    Assessment: Status post IM rod right femur with postoperative wound drainage  I the patient is continue to drain from his incision.  The VAC dressing is still holding suction although there is a fair bit of bloody drainage today underneath the sponge.  However it is only gone for slightly over 12 hours.  His H&H is trending down still however the there was less of a drop over last night than in previous days.  At this point I like to give the [facility] dressing at least another day or so and see if this helps stem the bleeding.  This may need to be changed due to the ability discharge underneath but will continue observe this  for right now.  We will continue to hold his Eliquis .  We will follow his H&H through at least tomorrow if not further.  This stabilizes, and the drainage stops we can hold off from further interventions.  However this persists, we may need to consider exploration of the wound and evacuation of the hematoma.  Cordella Rhein, MD, MS [facility] [phone]

## 2023-10-03 NOTE — Progress Notes (Signed)
 Physical Therapy Treatment Patient Details Name: John [name] MRN: [id number] DOB: 1960/09/20 Today's Date: 10/03/2023   History of Present Illness 63 y.o. male admitted 09/27/23 after ground-level fall sustaining right displaced intertrochanteric hip fracture. Pt s/p IM rod 9/26. PMHx: CAD, ischemic cardiomyopathy, CVA, HTN, HLD, T2DM,  arthritis, and chronic systolic CHF with an ICD implant.    PT Comments  Pt resting in bed on arrival. Noted bed pad and R side of gown saturated with blood from wound vac site, RN into room and reinforcing dressing with pt in standing at start of session. Pt able to self mobility RLE to and off EOB and elevate trunk with HOB elevated without assist this session with cues for sequencing. Pt coming to stand with min A from elevated EOB and able to maintain standing for majority of session, ~15 mins. Pt continues to be limited in gait distance pt pain, with pt self limiting weight bearing on R with poor ability to achieve foot flat. Pt performing standing and seated LE exercises for increased ROM and strength. Pt continues to benefit from skilled PT services to progress toward functional mobility goals.     If plan is discharge home, recommend the following: Two people to help with walking and/or transfers;A lot of help with bathing/dressing/bathroom;Assistance with cooking/housework;Assist for transportation;Help with stairs or ramp for entrance   Can travel by private vehicle        Equipment Recommendations  None recommended by PT (Pt already has DME)    Recommendations for Other Services       Precautions / Restrictions Precautions Precautions: Fall;Other (comment) Recall of Precautions/Restrictions: Impaired Precaution/Restrictions Comments: wound vac Restrictions Weight Bearing Restrictions Per Provider Order: No RLE Weight Bearing Per Provider Order: Weight bearing as tolerated Other Position/Activity Restrictions: Unrestricted RLE ROM      Mobility  Bed Mobility Overal bed mobility: Needs Assistance Bed Mobility: Supine to Sit     Supine to sit: Contact guard     General bed mobility comments: pt able to self mobilize RLE by hooking L toes under R ankle and bring to and off EOB and elevate trunk without assist    Transfers Overall transfer level: Needs assistance Equipment used: Rolling walker (2 wheels) Transfers: Sit to/from Stand Sit to Stand: Min assist, From elevated surface           General transfer comment: cues to scoot out to EOB, min A to rise from elevated EOB, cues for trunk and LE extension as well as holding RW with palms and not between finger    Ambulation/Gait Ambulation/Gait assistance: Min assist Gait Distance (Feet): 6 Feet Assistive device: Rolling walker (2 wheels) Gait Pattern/deviations: Step-to pattern, Decreased step length - right, Decreased step length - left, Decreased stance time - right, Decreased weight shift to right, Knee flexed in stance - right, Antalgic, Trunk flexed Gait velocity: decr     General Gait Details: Pt took short slow labored steps with heavy reliance on BUE support on RW. improved ability to advance RLE   Stairs             Wheelchair Mobility     Tilt Bed    Modified Rankin (Stroke Patients Only)       Balance Overall balance assessment: Needs assistance Sitting-balance support: Bilateral upper extremity supported, Feet supported Sitting balance-Leahy Scale: Poor Sitting balance - Comments: Increased right lean in sitting, needing UE support to maintain   Standing balance support: Bilateral upper extremity supported, During functional  activity, Reliant on assistive device for balance Standing balance-Leahy Scale: Poor Standing balance comment: reliant on RW support, pt able to maintain standing for majortiy of session                            Communication Communication Communication: Impaired Factors Affecting  Communication: Hearing impaired  Cognition Arousal: Alert Behavior During Therapy: WFL for tasks assessed/performed   PT - Cognitive impairments: No apparent impairments                         Following commands: Intact      Cueing Cueing Techniques: Verbal cues, Tactile cues  Exercises General Exercises - Lower Extremity Quad Sets: AROM, Right, 10 reps Heel Slides: AROM, Right, 5 reps Other Exercises Other Exercises: standing R hip/knee flexion x10 Other Exercises: standing R terminal knee extension x5    General Comments General comments (skin integrity, edema, etc.): pt with noted drainage from wound vac site on arrival, pad and gown on R saturated, called RN into room, RN reinforcing dressing with pt in standing.      Pertinent Vitals/Pain Pain Assessment Pain Assessment: Faces Faces Pain Scale: Hurts little more Pain Location: R hip Pain Descriptors / Indicators: Operative site guarding, Grimacing, Moaning, Aching, Discomfort, Sharp Pain Intervention(s): Monitored during session, Limited activity within patient's tolerance, Repositioned    Home Living                          Prior Function            PT Goals (current goals can now be found in the care plan section) Acute Rehab PT Goals Patient Stated Goal: Have less pain PT Goal Formulation: With patient Time For Goal Achievement: 10/12/23 Progress towards PT goals: Progressing toward goals    Frequency    Min 3X/week      PT Plan      Co-evaluation              AM-PAC PT 6 Clicks Mobility   Outcome Measure  Help needed turning from your back to your side while in a flat bed without using bedrails?: A Lot Help needed moving from lying on your back to sitting on the side of a flat bed without using bedrails?: A Lot Help needed moving to and from a bed to a chair (including a wheelchair)?: A Lot Help needed standing up from a chair using your arms (e.g., wheelchair or  bedside chair)?: A Lot Help needed to walk in hospital room?: Total (<20') Help needed climbing 3-5 steps with a railing? : Total 6 Click Score: 10    End of Session Equipment Utilized During Treatment: Gait belt Activity Tolerance: Patient limited by pain Patient left: in chair;with call bell/phone within reach Nurse Communication: Mobility status PT Visit Diagnosis: Pain;Difficulty in walking, not elsewhere classified (R26.2);Other abnormalities of gait and mobility (R26.89);Unsteadiness on feet (R26.81) Pain - Right/Left: Right Pain - part of body: Hip;Knee     Time: 8887-8865 PT Time Calculation (min) (ACUTE ONLY): 22 min  Charges:    $Therapeutic Activity: 8-22 mins PT General Charges $$ ACUTE PT VISIT: 1 Visit                     Lorilee Cafarella R. PTA Acute Rehabilitation Services Office: [phone]   Therisa CHRISTELLA Boor 10/03/2023, 12:19 PM

## 2023-10-03 NOTE — Progress Notes (Addendum)
 Progress Note   Patient: John [name] FMW:[id number] DOB: February 16, 1960 DOA: 09/27/2023     6 DOS: the patient was seen and examined on 10/03/2023   Brief hospital course:  63 y.o. male with medical history significant of CAD, ischemic cardiomyopathy managed medically, prior CVA, HTN, HLD, DM 2, chronic systolic CHF with an ICD implant comes into the hospital after ground-level fall.  He works nights, at [employer], was pulling on a heavy object then lost grip and balance and landed on his back. Following that, he experienced right hip pain, was unable to walk and came to the hospital.  He was found to have a hip fracture, orthopedic surgery was consulted and he was admitted to the hospitalist service.   Assessment and Plan:  Principle problem: Right hip fracture  --Orthopedic surgery consulted --status post intertrochanteric fixation with IM rod on 9/26 with Dr. Reyne  --Hold Eliquis   --Pain control  --Bowel regimen --PT recommends CIR, formal assessment pending, also insurance auth (worker's comp)   Post-op Hematomas - small, seen in gluteus medius and minimus on CT of right hip on 10/1.  Ortho is closely following & may need return to OR if bleeding continues.    Active problems: Acute blood loss anemia -- Hbg down from 11.7 >> 9.8 >> 8.8 this AM, with persistent bleeding from the right hip.  Hbg still trending down, but less rapidly since holding Eliquis  --Continue to Hold Eliquis  --Trend Hbg and transfuse < 8 given cardiac history  CAD, ischemic cardiomyopathy -prior to his fall he has no ischemic symptoms, no angina, able to do all his ADLs work at full capacity without any issues.   - Remains stable, euvolemic, no chest pain   Prior CVA-no residual deficits, able to work/do all his ADLs prior to hospitalization.   --PT recommends CIR, assessment pending   Chronic systolic CHF -most recent 2D echo March 2023 LVEF 25-30%, no WMA, grade 1 diastolic dysfunction, RV size  and systolic function were normal.  --Resumed on Entresto  and Coreg    HTN - continue Entresto , Coreg    HLD -continue home medications   CKD 3 A -baseline creatinine 1.1-1.4, at baseline today    DM 2, poorly controlled-placed on glargine/sliding scale. Will resume home glipizide  tomorrow AM Titrate regimen for inpatient goal 140-180      Subjective: Pt seen this AM, awake resting in bed.  He expresses frustration about ongoing bleeding and the possibility of needing further surgery.  He is hopeful it stops and he can go down to the rehab floor soon.  He requests to take a shower.  He denies other acute complaints.    Physical Exam: Vitals:   10/02/23 0741 10/02/23 1412 10/02/23 2027 10/03/23 0803  BP: 121/75 108/78 111/69 138/70  Pulse: 98 100 88 (!) 102  Resp: 16 16 17 18   Temp: 98.2 F (36.8 C) 98 F (36.7 C) 98.5 F (36.9 C) 98 F (36.7 C)  TempSrc: Oral Oral Oral   SpO2: 97% 97% 97% 99%  Weight:      Height:       General exam: awake, alert, no acute distress HEENT: moist mucus membranes, hearing grossly normal  Respiratory system: on room air, normal respiratory effort. Cardiovascular system: RRR,  no pedal edema.   Gastrointestinal system: soft, NT, ND Central nervous system: A&O x 3. no gross focal neurologic deficits, normal speech Extremities: right hip vac dressing in place with bloody drainage in tubing Skin: dry, intact, normal temperature Psychiatry:  normal mood, congruent affect, judgement and insight appear normal    Data Reviewed:  Notable labs -- Na 132, glucose 158, BUN 29, Ca 8.3, Hbg 11.7 >> 9.8 >> 8.8, WBC 10.8  Family Communication: None present. Pt updated.  Disposition: Status is: Inpatient Remains inpatient appropriate because: ongoing evaluation and close monitoring of post-op bleeding from right hip, Hbg continues to trend down.    Planned Discharge Destination: Acute Inpatient Rehab    Time spent:  40 minutes  Author: Burnard DELENA Cunning, DO 10/03/2023 1:25 PM  For on call review [email or web address].

## 2023-10-03 NOTE — Progress Notes (Signed)
 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  Hgb dropping, per Dr. Fausto hold transfer to CIR until determination regarding further intervention is made.    Reche Lowers, PT, DPT Admissions Coordinator [phone] 10/03/23  12:20 PM

## 2023-10-04 ENCOUNTER — Inpatient Hospital Stay (HOSPITAL_COMMUNITY)
Admission: AD | Admit: 2023-10-04 | Discharge: 2023-10-11 | DRG: 560 | Disposition: A | Discharge status: Home Health | Payer: Worker's Compensation | Source: Intra-hospital | Attending: Physical Medicine and Rehabilitation | Admitting: Physical Medicine and Rehabilitation

## 2023-10-04 ENCOUNTER — Other Ambulatory Visit: Payer: Self-pay

## 2023-10-04 ENCOUNTER — Encounter (HOSPITAL_COMMUNITY): Payer: Self-pay | Admitting: Physical Medicine and Rehabilitation

## 2023-10-04 DIAGNOSIS — E785 Hyperlipidemia, unspecified: Secondary | ICD-10-CM | POA: Diagnosis present

## 2023-10-04 DIAGNOSIS — Z7984 Long term (current) use of oral hypoglycemic drugs: Secondary | ICD-10-CM

## 2023-10-04 DIAGNOSIS — I2510 Atherosclerotic heart disease of native coronary artery without angina pectoris: Secondary | ICD-10-CM | POA: Diagnosis present

## 2023-10-04 DIAGNOSIS — I951 Orthostatic hypotension: Secondary | ICD-10-CM | POA: Diagnosis present

## 2023-10-04 DIAGNOSIS — Z8249 Family history of ischemic heart disease and other diseases of the circulatory system: Secondary | ICD-10-CM | POA: Diagnosis not present

## 2023-10-04 DIAGNOSIS — W19XXXD Unspecified fall, subsequent encounter: Secondary | ICD-10-CM | POA: Diagnosis present

## 2023-10-04 DIAGNOSIS — K5901 Slow transit constipation: Secondary | ICD-10-CM | POA: Diagnosis present

## 2023-10-04 DIAGNOSIS — M96840 Postprocedural hematoma of a musculoskeletal structure following a musculoskeletal system procedure: Secondary | ICD-10-CM | POA: Diagnosis present

## 2023-10-04 DIAGNOSIS — M62838 Other muscle spasm: Secondary | ICD-10-CM | POA: Diagnosis not present

## 2023-10-04 DIAGNOSIS — E1122 Type 2 diabetes mellitus with diabetic chronic kidney disease: Secondary | ICD-10-CM | POA: Diagnosis present

## 2023-10-04 DIAGNOSIS — Z87891 Personal history of nicotine dependence: Secondary | ICD-10-CM | POA: Diagnosis not present

## 2023-10-04 DIAGNOSIS — Z794 Long term (current) use of insulin: Secondary | ICD-10-CM | POA: Diagnosis not present

## 2023-10-04 DIAGNOSIS — Z7985 Long-term (current) use of injectable non-insulin antidiabetic drugs: Secondary | ICD-10-CM

## 2023-10-04 DIAGNOSIS — Z833 Family history of diabetes mellitus: Secondary | ICD-10-CM | POA: Diagnosis not present

## 2023-10-04 DIAGNOSIS — S72144D Nondisplaced intertrochanteric fracture of right femur, subsequent encounter for closed fracture with routine healing: Secondary | ICD-10-CM

## 2023-10-04 DIAGNOSIS — I130 Hypertensive heart and chronic kidney disease with heart failure and stage 1 through stage 4 chronic kidney disease, or unspecified chronic kidney disease: Secondary | ICD-10-CM | POA: Diagnosis present

## 2023-10-04 DIAGNOSIS — G4700 Insomnia, unspecified: Secondary | ICD-10-CM | POA: Diagnosis present

## 2023-10-04 DIAGNOSIS — R5381 Other malaise: Principal | ICD-10-CM | POA: Diagnosis present

## 2023-10-04 DIAGNOSIS — Z95810 Presence of automatic (implantable) cardiac defibrillator: Secondary | ICD-10-CM | POA: Diagnosis not present

## 2023-10-04 DIAGNOSIS — E119 Type 2 diabetes mellitus without complications: Secondary | ICD-10-CM

## 2023-10-04 DIAGNOSIS — Z7901 Long term (current) use of anticoagulants: Secondary | ICD-10-CM

## 2023-10-04 DIAGNOSIS — N1831 Chronic kidney disease, stage 3a: Secondary | ICD-10-CM | POA: Diagnosis present

## 2023-10-04 DIAGNOSIS — D62 Acute posthemorrhagic anemia: Secondary | ICD-10-CM

## 2023-10-04 DIAGNOSIS — I10 Essential (primary) hypertension: Secondary | ICD-10-CM | POA: Diagnosis present

## 2023-10-04 DIAGNOSIS — Z8673 Personal history of transient ischemic attack (TIA), and cerebral infarction without residual deficits: Secondary | ICD-10-CM

## 2023-10-04 DIAGNOSIS — Z79899 Other long term (current) drug therapy: Secondary | ICD-10-CM | POA: Diagnosis not present

## 2023-10-04 DIAGNOSIS — I5022 Chronic systolic (congestive) heart failure: Secondary | ICD-10-CM | POA: Diagnosis present

## 2023-10-04 DIAGNOSIS — E7800 Pure hypercholesterolemia, unspecified: Secondary | ICD-10-CM | POA: Diagnosis present

## 2023-10-04 DIAGNOSIS — S72009A Fracture of unspecified part of neck of unspecified femur, initial encounter for closed fracture: Secondary | ICD-10-CM | POA: Diagnosis present

## 2023-10-04 DIAGNOSIS — Z825 Family history of asthma and other chronic lower respiratory diseases: Secondary | ICD-10-CM

## 2023-10-04 DIAGNOSIS — Z9181 History of falling: Secondary | ICD-10-CM

## 2023-10-04 DIAGNOSIS — S72141D Displaced intertrochanteric fracture of right femur, subsequent encounter for closed fracture with routine healing: Secondary | ICD-10-CM | POA: Diagnosis present

## 2023-10-04 DIAGNOSIS — I639 Cerebral infarction, unspecified: Secondary | ICD-10-CM | POA: Diagnosis present

## 2023-10-04 DIAGNOSIS — S72001D Fracture of unspecified part of neck of right femur, subsequent encounter for closed fracture with routine healing: Secondary | ICD-10-CM | POA: Diagnosis not present

## 2023-10-04 DIAGNOSIS — Y990 Civilian activity done for income or pay: Secondary | ICD-10-CM | POA: Diagnosis not present

## 2023-10-04 DIAGNOSIS — R739 Hyperglycemia, unspecified: Secondary | ICD-10-CM | POA: Diagnosis not present

## 2023-10-04 DIAGNOSIS — I255 Ischemic cardiomyopathy: Secondary | ICD-10-CM | POA: Diagnosis present

## 2023-10-04 LAB — CBC
HCT: 25.3 % — ABNORMAL LOW (ref 39.0–52.0)
Hemoglobin: 8.6 g/dL — ABNORMAL LOW (ref 13.0–17.0)
MCH: 31.7 pg (ref 26.0–34.0)
MCHC: 34 g/dL (ref 30.0–36.0)
MCV: 93.4 fL (ref 80.0–100.0)
Platelets: 372 K/uL (ref 150–400)
RBC: 2.71 MIL/uL — ABNORMAL LOW (ref 4.22–5.81)
RDW: 12.7 % (ref 11.5–15.5)
WBC: 11 K/uL — ABNORMAL HIGH (ref 4.0–10.5)
nRBC: 0.4 % — ABNORMAL HIGH (ref 0.0–0.2)

## 2023-10-04 LAB — BASIC METABOLIC PANEL WITH GFR
Anion gap: 11 (ref 5–15)
BUN: 25 mg/dL — ABNORMAL HIGH (ref 8–23)
CO2: 24 mmol/L (ref 22–32)
Calcium: 8.6 mg/dL — ABNORMAL LOW (ref 8.9–10.3)
Chloride: 102 mmol/L (ref 98–111)
Creatinine, Ser: 1.01 mg/dL (ref 0.61–1.24)
GFR, Estimated: >60 mL/min (ref >60)
Glucose, Bld: 139 mg/dL — ABNORMAL HIGH (ref 70–99)
Potassium: 4 mmol/L (ref 3.5–5.1)
Sodium: 137 mmol/L (ref 135–145)

## 2023-10-04 LAB — GLUCOSE, CAPILLARY
Glucose-Capillary: 134 mg/dL — ABNORMAL HIGH (ref 70–99)
Glucose-Capillary: 155 mg/dL — ABNORMAL HIGH (ref 70–99)
Glucose-Capillary: 145 mg/dL — ABNORMAL HIGH (ref 70–99)
Glucose-Capillary: 130 mg/dL — ABNORMAL HIGH (ref 70–99)
Glucose-Capillary: 199 mg/dL — ABNORMAL HIGH (ref 70–99)

## 2023-10-04 MED ORDER — INSULIN ASPART 100 UNIT/ML IJ SOLN
0.0000 [IU] | Freq: Three times a day (TID) | INTRAMUSCULAR | Status: DC
  Administered 2023-10-05 – 2023-10-06 (×3): 1 [IU] via SUBCUTANEOUS
  Administered 2023-10-06 – 2023-10-07 (×2): 2 [IU] via SUBCUTANEOUS
  Administered 2023-10-07: 1 [IU] via SUBCUTANEOUS
  Administered 2023-10-07: 2 [IU] via SUBCUTANEOUS
  Administered 2023-10-08: 1 [IU] via SUBCUTANEOUS
  Administered 2023-10-08: 2 [IU] via SUBCUTANEOUS

## 2023-10-04 MED ORDER — DIPHENHYDRAMINE HCL 25 MG PO CAPS: 25.0000 mg | ORAL_CAPSULE | Freq: Four times a day (QID) | ORAL | Status: DC | PRN

## 2023-10-04 MED ORDER — BISACODYL 10 MG RE SUPP
10.0000 mg | Freq: Every day | RECTAL | Status: DC | PRN
Start: 2023-10-04 — End: 2023-10-09

## 2023-10-04 MED ORDER — ALUM & MAG HYDROXIDE-SIMETH 200-200-20 MG/5ML PO SUSP
30.0000 mL | ORAL | Status: DC | PRN
  Filled 2023-10-04: qty 30

## 2023-10-04 MED ORDER — SENNOSIDES-DOCUSATE SODIUM 8.6-50 MG PO TABS
2.0000 | ORAL_TABLET | Freq: Two times a day (BID) | ORAL | Status: DC
  Administered 2023-10-05 – 2023-10-11 (×9): 2 via ORAL
  Filled 2023-10-04 (×11): qty 2

## 2023-10-04 MED ORDER — MORPHINE SULFATE (PF) 2 MG/ML IV SOLN: 0.5000 mg | INTRAVENOUS | Status: DC | PRN

## 2023-10-04 MED ORDER — INSULIN GLARGINE 100 UNIT/ML ~~LOC~~ SOLN
20.0000 [IU] | Freq: Every day | SUBCUTANEOUS | Status: DC
Start: 2023-10-05 — End: 2023-10-11
  Administered 2023-10-05 – 2023-10-11 (×7): 20 [IU] via SUBCUTANEOUS
  Filled 2023-10-04 (×7): qty 0.2

## 2023-10-04 MED ORDER — PROCHLORPERAZINE 25 MG RE SUPP: 12.5000 mg | Freq: Four times a day (QID) | RECTAL | Status: DC | PRN

## 2023-10-04 MED ORDER — GUAIFENESIN-DM 100-10 MG/5ML PO SYRP: 5.0000 mL | ORAL_SOLUTION | Freq: Four times a day (QID) | ORAL | Status: DC | PRN

## 2023-10-04 MED ORDER — POLYETHYLENE GLYCOL 3350 17 G PO PACK: 17.0000 g | PACK | Freq: Two times a day (BID) | ORAL | Status: DC

## 2023-10-04 MED ORDER — MAGNESIUM HYDROXIDE 400 MG/5ML PO SUSP: 15.0000 mL | Freq: Every day | ORAL | Status: DC | PRN

## 2023-10-04 MED ORDER — INSULIN ASPART 100 UNIT/ML IJ SOLN: 0.0000 [IU] | Freq: Three times a day (TID) | INTRAMUSCULAR | Status: DC

## 2023-10-04 MED ORDER — ZINC SULFATE 220 (50 ZN) MG PO CAPS
220.0000 mg | ORAL_CAPSULE | Freq: Every day | ORAL | Status: DC
  Administered 2023-10-04 – 2023-10-10 (×7): 220 mg via ORAL
  Filled 2023-10-04 (×7): qty 1

## 2023-10-04 MED ORDER — EZETIMIBE 10 MG PO TABS
10.0000 mg | ORAL_TABLET | Freq: Every day | ORAL | Status: DC
  Administered 2023-10-05 – 2023-10-11 (×7): 10 mg via ORAL
  Filled 2023-10-04 (×7): qty 1

## 2023-10-04 MED ORDER — PROCHLORPERAZINE MALEATE 5 MG PO TABS: 5.0000 mg | ORAL_TABLET | Freq: Four times a day (QID) | ORAL | Status: DC | PRN

## 2023-10-04 MED ORDER — SPIRONOLACTONE 12.5 MG HALF TABLET
12.5000 mg | ORAL_TABLET | Freq: Every day | ORAL | Status: DC
  Administered 2023-10-05 – 2023-10-11 (×7): 12.5 mg via ORAL
  Filled 2023-10-04 (×7): qty 1

## 2023-10-04 MED ORDER — INSULIN ASPART 100 UNIT/ML IJ SOLN: 0.0000 [IU] | Freq: Every day | INTRAMUSCULAR | Status: DC

## 2023-10-04 MED ORDER — BISACODYL 5 MG PO TBEC: 5.0000 mg | DELAYED_RELEASE_TABLET | Freq: Every day | ORAL | Status: DC | PRN

## 2023-10-04 MED ORDER — GLIPIZIDE ER 10 MG PO TB24
10.0000 mg | ORAL_TABLET | Freq: Every day | ORAL | Status: DC
  Administered 2023-10-05 – 2023-10-11 (×7): 10 mg via ORAL
  Filled 2023-10-04 (×7): qty 1

## 2023-10-04 MED ORDER — ACETAMINOPHEN 325 MG PO TABS
325.0000 mg | ORAL_TABLET | ORAL | Status: DC | PRN
  Administered 2023-10-04 – 2023-10-08 (×4): 650 mg via ORAL
  Filled 2023-10-04 (×5): qty 2

## 2023-10-04 MED ORDER — ZOLPIDEM TARTRATE 5 MG PO TABS
5.0000 mg | ORAL_TABLET | Freq: Every evening | ORAL | Status: DC | PRN
  Administered 2023-10-04 – 2023-10-10 (×6): 5 mg via ORAL
  Filled 2023-10-04 (×7): qty 1

## 2023-10-04 MED ORDER — GLUCERNA SHAKE PO LIQD: 237.0000 mL | Freq: Three times a day (TID) | ORAL | Status: DC

## 2023-10-04 MED ORDER — ADULT MULTIVITAMIN W/MINERALS CH: 1.0000 | ORAL_TABLET | Freq: Every day | ORAL | Status: AC

## 2023-10-04 MED ORDER — PROCHLORPERAZINE EDISYLATE 10 MG/2ML IJ SOLN: 5.0000 mg | Freq: Four times a day (QID) | INTRAMUSCULAR | Status: DC | PRN

## 2023-10-04 MED ORDER — ADULT MULTIVITAMIN W/MINERALS CH
1.0000 | ORAL_TABLET | Freq: Every day | ORAL | Status: DC
Start: 2023-10-05 — End: 2023-10-11
  Administered 2023-10-05 – 2023-10-11 (×7): 1 via ORAL
  Filled 2023-10-04 (×7): qty 1

## 2023-10-04 MED ORDER — METHOCARBAMOL 500 MG PO TABS
500.0000 mg | ORAL_TABLET | Freq: Four times a day (QID) | ORAL | Status: DC | PRN
  Administered 2023-10-04 – 2023-10-06 (×4): 500 mg via ORAL
  Filled 2023-10-04 (×4): qty 1

## 2023-10-04 MED ORDER — SORBITOL 70 % SOLN
60.0000 mL | Status: AC
  Administered 2023-10-04: 60 mL via ORAL
  Filled 2023-10-04: qty 60

## 2023-10-04 MED ORDER — BISACODYL 5 MG PO TBEC: 5.0000 mg | DELAYED_RELEASE_TABLET | Freq: Every day | ORAL | Status: AC | PRN

## 2023-10-04 MED ORDER — VITAMIN C 500 MG PO TABS
1000.0000 mg | ORAL_TABLET | Freq: Every day | ORAL | Status: DC
  Administered 2023-10-04 – 2023-10-11 (×8): 1000 mg via ORAL
  Filled 2023-10-04 (×8): qty 2

## 2023-10-04 MED ORDER — INSULIN GLARGINE 100 UNIT/ML ~~LOC~~ SOLN: 20.0000 [IU] | Freq: Every day | SUBCUTANEOUS | 11 refills | Status: DC

## 2023-10-04 MED ORDER — HYDROCODONE-ACETAMINOPHEN 5-325 MG PO TABS
1.0000 | ORAL_TABLET | Freq: Four times a day (QID) | ORAL | Status: DC | PRN
  Administered 2023-10-04 – 2023-10-11 (×23): 2 via ORAL
  Filled 2023-10-04 (×24): qty 2

## 2023-10-04 MED ORDER — POLYETHYLENE GLYCOL 3350 17 G PO PACK
17.0000 g | PACK | Freq: Two times a day (BID) | ORAL | Status: DC
Start: 2023-10-04 — End: 2023-10-09
  Administered 2023-10-06 – 2023-10-09 (×5): 17 g via ORAL
  Filled 2023-10-04 (×7): qty 1

## 2023-10-04 MED ORDER — GLUCERNA SHAKE PO LIQD
237.0000 mL | Freq: Three times a day (TID) | ORAL | Status: DC
  Administered 2023-10-06 (×2): 237 mL via ORAL

## 2023-10-04 MED ORDER — ONDANSETRON HCL 4 MG PO TABS: 4.0000 mg | ORAL_TABLET | Freq: Four times a day (QID) | ORAL | Status: DC | PRN

## 2023-10-04 MED ORDER — CARVEDILOL 12.5 MG PO TABS
12.5000 mg | ORAL_TABLET | Freq: Two times a day (BID) | ORAL | Status: DC
  Administered 2023-10-04 – 2023-10-11 (×14): 12.5 mg via ORAL
  Filled 2023-10-04 (×14): qty 1

## 2023-10-04 MED ORDER — DAPAGLIFLOZIN PROPANEDIOL 10 MG PO TABS
10.0000 mg | ORAL_TABLET | Freq: Every day | ORAL | Status: DC
  Administered 2023-10-05 – 2023-10-11 (×7): 10 mg via ORAL
  Filled 2023-10-04 (×7): qty 1

## 2023-10-04 MED ORDER — SENNOSIDES-DOCUSATE SODIUM 8.6-50 MG PO TABS: 2.0000 | ORAL_TABLET | Freq: Two times a day (BID) | ORAL | Status: AC

## 2023-10-04 MED ORDER — INSULIN ASPART 100 UNIT/ML IJ SOLN
0.0000 [IU] | Freq: Every day | INTRAMUSCULAR | Status: DC
  Administered 2023-10-05 – 2023-10-07 (×2): 2 [IU] via SUBCUTANEOUS

## 2023-10-04 NOTE — Progress Notes (Signed)
 Transition of Care [facility]) - CAGE-AID Screening   Patient Details  Name: John [name] MRN: [id number] Date of Birth: 24-Nov-1960   [name], RN Trauma Response Nurse Phone Number: [phone] 10/04/2023, 10:05 AM     CAGE-AID Screening:    Have You Ever Felt You Ought to Cut Down on Your Drinking or Drug Use?: No Have People Annoyed You By [employer] Your Drinking Or Drug Use?: No Have You Felt Bad Or Guilty About Your Drinking Or Drug Use?: No Have You Ever Had a Drink or Used Drugs First Thing In The Morning to Steady Your Nerves or to Get Rid of a Hangover?: No CAGE-AID Score: 0  Substance Abuse Education Offered: (S) No (pt reports alcohol use socially only)

## 2023-10-04 NOTE — Discharge Summary (Addendum)
 Physician Discharge Summary   Patient: John [name] MRN: [id number] DOB: 1960-06-19  Admit date:     09/27/2023  Discharge date: 10/04/23  Discharge Physician: Burnard DELENA Cunning   PCP: Berneta Elsie Sayre, MD   Recommendations at discharge:   Follow up with Orthopedic Surgery -- Dr. Reyne Maintain incisional VAC dressing until Ortho recommends otherwise Monitor the incisional dressing for bleeding Trend Hbg closely Eliquis  is on hold until anemia stabilizes and improves Monitor blood glucose & titrate insulin  regimen.  Pt currently requiring lower insulin  dose than at home.   Follow up with Primary Care in 1-2 weeks after rehab Other post-d/c follow up recommendations per Acute Inpatient Rehab MD upon d/c home  Discharge Diagnoses: Principal Problem:   Hip fracture (HCC) Active Problems:   ABLA (acute blood loss anemia)  Resolved Problems:   * No resolved hospital problems. *  Hospital Course:  63 y.o. male with medical history significant of CAD, ischemic cardiomyopathy managed medically, prior CVA, HTN, HLD, DM 2, chronic systolic CHF with an ICD implant comes into the hospital after ground-level fall.  He works nights, at [employer], was pulling on a heavy object then lost grip and balance and landed on his back. Following that, he experienced right hip pain, was unable to walk and came to the hospital.  He was found to have a hip fracture, orthopedic surgery was consulted and he was admitted to the hospitalist service.    Further hospital course and management as outlined below.   10/3 -- pt doing well today.  Hbg stabilized with today 8.6 from 8.8 yesterday.  Bleeding seems to have stopped with holding Eliquis  past two days. Patient is cleared to transfer to rehab from Orthopedic and Medical standpoints today.  Patient is agreeable and eager to discharge to rehab.   Assessment and Plan:  Principle problem: Right hip fracture  --Orthopedic surgery  consulted --status post intertrochanteric fixation with IM rod on 9/26 with Dr. Reyne  --Hold Eliquis   --Pain control  --Bowel regimen --PT recommended CIR -- pt accepted for transfer today   Post-op Hematomas - small, seen in gluteus medius and minimus on CT of right hip on 10/1.  Ortho is closely following & may need return to OR if bleeding continues.   --Ortho recommends maintain incisional vac dressing until it's staying dry   Active problems: Acute blood loss anemia -- Hbg down from 11.7 >> 9.8 >> 8.8 >> 8.6 this AM.  Hbg stabilizing after holding Eliquis  past two days. --Continue to Hold Eliquis  --Continue to monitor Hbg closely --Transfuse for Hbg < 8 given cardiac history   CAD, ischemic cardiomyopathy -prior to his fall he has no ischemic symptoms, no angina, able to do all his ADLs work at full capacity without any issues.  Remains stable, no chest pain   Prior CVA-no residual deficits, able to work/do all his ADLs prior to hospitalization.   --Eliquis  on hold as above   Chronic systolic CHF -most recent 2D echo March 2023 LVEF 25-30%, no WMA, grade 1 diastolic dysfunction, RV size and systolic function were normal.  Stable, euvolemic. --Holding Entresto  (resume as BP tolerates), continue Coreg  --Daily weights to monitor volume status   HTN - Holding Entresto  (resume as BP tolerates), continue Coreg    HLD -continue Zetia    CKD 3 A -baseline creatinine 1.1-1.4, at baseline today    DM 2, poorly controlled- On insulin  coverage with glargine/sliding scale -- inpatient insulin  needs have been lower than at  home. Resumed home glipizide   Titrate regimen for inpatient goal 140-180 Resume Mounjaro  at d/c             Consultants: Orthopedic surgery Procedures performed:  intertrochanteric fixation with IM rod on 9/26 with Dr. Reyne  Disposition: Rehabilitation facility Diet recommendation:  Discharge Diet Orders (From admission, onward)     Start     Ordered    10/04/23 0000  Diet - Heart Healthy & Carb modified       10/04/23 1035            DISCHARGE MEDICATION: Allergies as of 10/04/2023   No Known Allergies      Medication List     PAUSE taking these medications    Eliquis  5 MG Tabs tablet Wait to take this until your doctor or other care provider tells you to start again. Generic drug: apixaban  TAKE 1 TABLET BY MOUTH 2 TIMES A DAY   Entresto  24-26 MG Wait to take this until your doctor or other care provider tells you to start again. Generic drug: sacubitril-valsartan TAKE 1 TABLET BY MOUTH TWICE A DAY   Lantus  SoloStar 100 UNIT/ML Solostar Pen Wait to take this until your doctor or other care provider tells you to start again. Generic drug: insulin  glargine Inject 45 Units into the skin every morning. And pen needles 1/day You also have another medication with the same name that you may need to continue taking. What changed:  how much to take when to take this additional instructions       STOP taking these medications    BC FAST PAIN RELIEF PO       TAKE these medications    acetaminophen  500 MG tablet Commonly known as: TYLENOL  Take 500 mg by mouth every 6 (six) hours as needed for moderate pain or headache.   bisacodyl  5 MG EC tablet Commonly known as: DULCOLAX Take 1 tablet (5 mg total) by mouth daily as needed for moderate constipation.   carvedilol  12.5 MG tablet Commonly known as: COREG  TAKE ONE TABLET BY MOUTH TWICE A DAY WITH A MEAL   chlorhexidine  4 % external liquid Commonly known as: HIBICLENS  Apply 15 mLs (1 Application total) topically as directed for 30 doses. Use as directed daily for 5 days every other week for 6 weeks.   dapagliflozin  propanediol 10 MG Tabs tablet Commonly known as: Farxiga  Take 1 tablet (10 mg total) by mouth daily.   diclofenac  Sodium 1 % Gel Commonly known as: Voltaren  Rub a small grape sized dollop into knee up to 3 times daily as needed.   ezetimibe   10 MG tablet Commonly known as: ZETIA  Take 1 tablet (10 mg total) by mouth daily.   feeding supplement (GLUCERNA SHAKE) Liqd Take 237 mLs by mouth 3 (three) times daily between meals.   glipiZIDE  10 MG 24 hr tablet Commonly known as: GLUCOTROL  XL Take 1 tablet (10 mg total) by mouth daily with breakfast.   HYDROcodone -acetaminophen  5-325 MG tablet Commonly known as: NORCO/VICODIN Take 1-2 tablets by mouth every 6 (six) hours as needed for moderate pain (pain score 4-6) or severe pain (pain score 7-10).   insulin  aspart 100 UNIT/ML injection Commonly known as: novoLOG  Inject 0-5 Units into the skin at bedtime.   insulin  aspart 100 UNIT/ML injection Commonly known as: novoLOG  Inject 0-9 Units into the skin 3 (three) times daily with meals.   insulin  glargine 100 UNIT/ML injection Commonly known as: LANTUS  Inject 0.2 mLs (20 Units total) into the  skin daily. What changed: Another medication with the same name was paused. Ask your nurse or doctor if you should take this medication.   magnesium hydroxide 400 MG/5ML suspension Commonly known as: MILK OF MAGNESIA Take 15 mLs by mouth daily as needed for mild constipation.   methocarbamol 500 MG tablet Commonly known as: ROBAXIN Take 1 tablet (500 mg total) by mouth every 6 (six) hours as needed.   morphine  (PF) 2 MG/ML injection Inject 0.25 mLs (0.5 mg total) into the vein every 2 (two) hours as needed.   multivitamin with minerals Tabs tablet Take 1 tablet by mouth daily.   mupirocin  ointment 2 % Commonly known as: BACTROBAN  Place 1 Application into the nose 2 (two) times daily for 60 doses. Use as directed 2 times daily for 5 days every other week for 6 weeks.   ondansetron  4 MG tablet Commonly known as: ZOFRAN  Take 1 tablet (4 mg total) by mouth every 6 (six) hours as needed for nausea.   polyethylene glycol 17 g packet Commonly known as: MIRALAX  / GLYCOLAX  Take 17 g by mouth 2 (two) times daily.   Repatha  SureClick  140 MG/ML Soaj Generic drug: Evolocumab  INJECT 1 MILLILITER UNDER THE SKIN EVERY 14 DAYS   senna-docusate 8.6-50 MG tablet Commonly known as: Senokot-S Take 2 tablets by mouth 2 (two) times daily.   sildenafil  100 MG tablet Commonly known as: Viagra  Take 0.5-1 tablets (50-100 mg total) by mouth daily as needed for erectile dysfunction.   spironolactone  25 MG tablet Commonly known as: ALDACTONE  TAKE 1/2 TABLET BY MOUTH DAILY   tirzepatide  12.5 MG/0.5ML Pen Commonly known as: MOUNJARO  Inject 12.5 mg into the skin once a week.   ZZZQUIL PO Take 1 Dose by mouth at bedtime as needed (Sleep).               Discharge Care Instructions  (From admission, onward)           Start     Ordered   10/04/23 0000  Discharge wound care:       Comments: Do not change incisional wound vac dressing -- per Orthopedic surgery.  For questions - contact Dr. Reyne  Reinforce dressing as needed.   10/04/23 1035            Follow-up Information     Reyne Cordella SQUIBB, MD. Schedule an appointment as soon as possible for a visit in 2 week(s).   Specialty: Orthopedic Surgery Contact information: [address] [place] [address] [phone]                Discharge Exam: Fredricka Weights   09/27/23 0550 09/27/23 1408  Weight: 76 kg 74.8 kg   General exam: awake, alert, no acute distress HEENT: atraumatic, clear conjunctiva, anicteric sclera, moist mucus membranes, hearing grossly normal  Respiratory system: CTAB, no wheezes, rales or rhonchi, normal respiratory effort. Cardiovascular system: normal S1/S2, RRR,  no pedal edema.   Gastrointestinal system: soft, NT, ND, no HSM felt, +bowel sounds. Central nervous system: A&O x 3. no gross focal neurologic deficits, normal speech Extremities: right hip vac dressing in place, no edema Skin: dry, intact, normal temperature Psychiatry: normal mood, congruent affect, judgement and insight appear normal   Condition at  discharge: stable  The results of significant diagnostics from this hospitalization (including imaging, microbiology, ancillary and laboratory) are listed below for reference.   Imaging Studies: CT HIP RIGHT W CONTRAST Result Date: 10/02/2023 EXAM: CT OF THE RIGHT HIP WITH  IV CONTRAST 10/02/2023 12:58:00 PM TECHNIQUE: CT of the right hip was performed with the administration of intravenous contrast. Multiplanar reformatted images are provided for review. Automated exposure control, iterative reconstruction, and/or weight based adjustment of the mA/kV was utilized to reduce the radiation dose to as low as reasonably achievable. COMPARISON: Interoperative right hip spot images from 09/27/2023 and preoperative radiographs from 09/27/2023. CLINICAL HISTORY: Right hip fracture due to fall. FINDINGS: BONES: Right hip proximal femoral nail system in place traversing the comminuted intertrochanteric fracture with a single distal interlocking screw on the im nail component. Aside from the original intertrochanteric fracture, no new or specific complicating fracture is identified. Due to the comminution, there are anterior and posterior fragments of the greater trochanter which are not connected to the nail system. Comminuted lesser trochanteric fragments are displaced medially as on image 63 series 15 and likewise not connected to the nail system. No immediately adjacent pelvic fracture is observed. No aggressive appearing osseous abnormality or periostitis. SOFT TISSUE: Expected gas tracts along regional gluteal musculature. A 3.0 x 2.7 cm hematoma in the right gluteus medius on image 34 of series 12 and a 4.3 x 2.4 cm hematoma along the margin between the gluteus medius and minimus on image 36 of series 12 likely represent incidental postoperative blood products. Edema tracks along regional fascial planes including the right iliopsoas, right gluteal and proximal anterior thigh musculature, and right hip adductor  musculature. Subcutaneous edema noted lateral to the right hip, not unexpected in this setting. There is some fullness of the right piriformis muscle but no sciatic notch impingement is observed. No soft tissue mass. No compelling findings of abscess at this time. JOINT: No significant degenerative changes. No osseous erosions. INTRAPELVIC CONTENTS: Limited images of the intrapelvic contents are unremarkable. Right external iliac and femoral atherosclerosis is present without overt occlusion. IMPRESSION: 1. Comminuted right intertrochanteric fracture postop day 5 status post proximal femoral nail fixation without new fracture. 2. Small hematomas in the right gluteus medius and between the gluteus medius and minimus. No findings of active bleeding. No abscess identified. 3. Right external iliac and femoral atherosclerosis without occlusion. 4. Expected edema tracking along regional muscular tissues and subcutaneous tissues. Electronically signed by: Ryan Salvage MD 10/02/2023 06:46 PM EDT RP Workstation: [id number]   DG FEMUR, MIN 2 VIEWS RIGHT Result Date: 09/27/2023 CLINICAL DATA:  Elective surgery for internal fixation of inter trochanteric fracture of the right hip EXAM: RIGHT FEMUR 2 VIEWS COMPARISON:  09/27/2023 FINDINGS: Intraoperative fluoroscopy is obtained for surgical control purposes. Fluoroscopy time is recorded at 84.6 seconds. Dose 17.9 mGy. 5 spot fluoroscopic images are provided. Spot fluoroscopic images demonstrate internal fixation of an inter trochanteric fracture of the right hip using an intramedullary rod with femoral neck compression bold and distal locking screw. Near anatomic alignment of the fracture fragments is suggested. IMPRESSION: Intraoperative fluoroscopy is obtained for surgical control purposes during internal fixation of the inter trochanteric fracture of the proximal right femur. Electronically Signed   By: Elsie Gravely M.D.   On: 09/27/2023 19:51   DG C-Arm 1-60  Min-No Report Result Date: 09/27/2023 Fluoroscopy was utilized by the requesting physician.  No radiographic interpretation.   DG C-Arm 1-60 Min-No Report Result Date: 09/27/2023 Fluoroscopy was utilized by the requesting physician.  No radiographic interpretation.   DG Chest 1 View Result Date: 09/27/2023 EXAM: 1 VIEW(S) XRAY OF THE CHEST 09/27/2023 06:25:47 AM COMPARISON: AP radiograph of the chest dated 06/02/2021. CLINICAL HISTORY: pain FINDINGS: LINES, TUBES AND  DEVICES: Left chest wall ICD in place. LUNGS AND PLEURA: Low lung volumes. No focal pulmonary opacity. No pulmonary edema. No pleural effusion. No pneumothorax. HEART AND MEDIASTINUM: No acute abnormality of the cardiac and mediastinal silhouettes. BONES AND SOFT TISSUES: Multilevel thoracic osteophytosis. IMPRESSION: 1. No acute cardiopulmonary abnormality. 2. Left chest wall implantable cardioverter-defibrillator in place. Electronically signed by: Evalene Coho MD 09/27/2023 06:53 AM EDT RP Workstation: [id number]   CT HEAD WO CONTRAST Result Date: 09/27/2023 EXAM: CT HEAD WITHOUT CONTRAST 09/27/2023 06:44:20 AM TECHNIQUE: CT of the head was performed without the administration of intravenous contrast. Automated exposure control, iterative reconstruction, and/or weight based adjustment of the mA/kV was utilized to reduce the radiation dose to as low as reasonably achievable. COMPARISON: CT angiogram of the head dated 05/31/2020. CLINICAL HISTORY: Head trauma, moderate-severe. Pt BIB [facility] from work at [employer], after ground level fall. Pt was reaching above his head when he lost balance and fell backwards. Pt denies LOC, did hit head and has 2 cm Lac; bleeding controlled. Pt does take eliquis . FINDINGS: BRAIN AND VENTRICLES: No acute hemorrhage. No evidence of acute infarct. No hydrocephalus. No extra-axial collection. No mass effect or midline shift. ORBITS: No acute abnormality. SINUSES: No acute abnormality. SOFT TISSUES AND  SKULL: 2 cm Lac; bleeding controlled. No skull fracture. IMPRESSION: 1. No acute intracranial abnormality. Electronically signed by: Evalene Coho MD 09/27/2023 06:52 AM EDT RP Workstation: [id number]   DG Hip Unilat With Pelvis 2-3 Views Right Result Date: 09/27/2023 EXAM: 2 OR MORE VIEW(S) XRAY OF THE RIGHT HIP 09/27/2023 06:25:47 AM COMPARISON: None available. CLINICAL HISTORY: pain FINDINGS: BONES AND JOINTS: Acute comminuted intertrochanteric fracture of the right femur with medially displaced lesser trochanter by 1.2 cm. The hip joint is maintained. No significant degenerative changes. SOFT TISSUES: Vascular calcifications. IMPRESSION: 1. Acute comminuted intertrochanteric fracture of the right femur with medially displaced lesser trochanter by 1.2 cm. Electronically signed by: Waddell Calk MD 09/27/2023 06:51 AM EDT RP Workstation: [id number]    Microbiology: Results for orders placed or performed during the hospital encounter of 09/27/23  Surgical pcr screen     Status: Abnormal   Collection Time: 09/27/23  9:56 AM   Specimen: Nasal Mucosa; Nasal Swab  Result Value Ref Range Status   MRSA, PCR NEGATIVE NEGATIVE Final   Staphylococcus aureus POSITIVE (A) NEGATIVE Final    Comment: (NOTE) The Xpert SA Assay (FDA approved for NASAL specimens in patients 52 years of age and older), is one component of a comprehensive surveillance program. It is not intended to diagnose infection nor to guide or monitor treatment. Performed at [facility], [address], [place], [place] [address]     Labs: CBC: Recent Labs  Lab 09/29/23 [phone] 10/01/23 0456 10/02/23 0520 10/03/23 0429 10/04/23 0408  WBC 13.7* 14.5* 10.5 10.8* 11.0*  HGB 13.2 11.7* 9.8* 8.8* 8.6*  HCT 37.6* 33.3* 28.1* 26.2* 25.3*  MCV 91.5 91.0 91.5 92.3 93.4  PLT 287 336 313 337 372   Basic Metabolic Panel: Recent Labs  Lab 09/28/23 0557 09/29/23 0539 10/01/23 0456 10/02/23 0520 10/03/23 0429  10/04/23 0408  NA 136 135 131* 132* 132* 137  K 3.9 4.0 4.1 4.2 4.0 4.0  CL 102 103 98 97* 100 102  CO2 21* 24 22 24 24 24   GLUCOSE 165* 155* 117* 208* 158* 139*  BUN 24* 30* 27* 34* 29* 25*  CREATININE 1.36* 1.35* 1.22 1.24 1.02 1.01  CALCIUM  8.6* 8.5* 8.4* 8.4* 8.3* 8.6*  MG 2.0 2.0 2.0 2.2  --   --    Liver Function Tests: Recent Labs  Lab 09/28/23 0557 09/29/23 0539 10/01/23 0456  AST 23 27 26   ALT 19 14 14   ALKPHOS 54 48 46  BILITOT 0.9 0.8 1.0  PROT 6.0* 5.8* 5.8*  ALBUMIN 3.4* 3.1* 2.9*   CBG: Recent Labs  Lab 10/03/23 1153 10/03/23 1716 10/03/23 2123 10/04/23 0609 10/04/23 0807  GLUCAP 232* 194* 242* 134* 155*    Discharge time spent: greater than 30 minutes.  Signed: Burnard DELENA Cunning, DO [facility] 10/04/2023

## 2023-10-04 NOTE — Progress Notes (Signed)
 Babs Arthea DASEN, MD  Physician Physical Medicine and Rehabilitation   PMR Pre-admission    Signed   Date of Service: 10/04/2023 10:26 AM  Related encounter: ED to Hosp-Admission (Current) from 09/27/2023 in [facility]   Signed     Expand All Collapse All  PMR Admission Coordinator Pre-Admission Assessment   Patient: John [name] is an 63 y.o., male MRN: [id number] DOB: [birth year] Height: 5' 7 (170.2 cm) Weight: 74.8 kg   Insurance Information HMO:     PPO:      PCP:      IPA:      80/20:     OTHER:  PRIMARY: Worker's Comp      Policy#: no number provided      Subscriber:  CM Name: Eleanor      Email: [phone]    Email: [email or web address]   Pre-Cert#: no auth number;  auth for CIR from Melissa with [employer] for admit 10/3 with next review date as requested to email above        Employer: / Benefits:  Phone #:      Name:  Eff. Date:      Deduct:       Out of Pocket Max:       Life Max:  CIR:       SNF:  Outpatient:      Co-Pay:  Home Health:       Co-Pay:  DME:      Co-Pay:  Providers: in-network SECONDARY:       Policy#:      Phone#:    Artist:       Phone#:    The Data processing manager" for patients in Inpatient Rehabilitation Facilities with attached "Privacy Act Statement-Health Care Records" was provided and verbally reviewed with: Patient   Emergency Contact Information Contact Information       Name Relation Home Work Mobile    [place] Spouse [phone]   [phone]    Angelica,tara Daughter     [phone]         Other Contacts   None on File        Current Medical History  Patient Admitting Diagnosis: Right hip fracture History of Present Illness: Pt is a 64 year old male with medical hx significant for: chronic systolic CHF n with an ICD implant, HTN, HLD, DM II, CAD, CVA on Eliquis . Pt presented to [facility] on 09/27/23 d/t fall at  work. Pt may have hit head but denied LOC. Pt reported pain in right hip. CT head negative for acute abnormalities. Hip x-ray showed right trochanteric hip fracture. Orthopedics consulted. Pt underwent intertrochanteric fixation with IM rod by Dr. Reyne on 09/27/23. Hospital course significant ABLA with hgb drop with 15 to 8.6 before stabilizing.  He has a wound vac on his right hip. Therapy evaluations completed and CIR recommended d/t pt's deficits in functional mobility.    Patient's medical record from [facility] has been reviewed by the rehabilitation admission coordinator and physician.   Past Medical History      Past Medical History:  Diagnosis Date   AICD (automatic cardioverter/defibrillator) present     Arthritis     Diabetes mellitus without complication (HCC)     Hyperlipidemia     Hypertension     Stroke (HCC) 05/2020          Has the patient had major surgery during 100 days  prior to admission? Yes   Family History   family history includes CAD in his mother; COPD in his father; Diabetes in his mother; Heart attack in his mother.   Current Medications  Current Medications    Current Facility-Administered Medications:    acetaminophen  (TYLENOL ) tablet 325-650 mg, 325-650 mg, Oral, Q6H PRN, Danton Lauraine LABOR, PA-C, 650 mg at 10/02/23 1317   bisacodyl  (DULCOLAX) EC tablet 5 mg, 5 mg, Oral, Daily PRN, Gherghe, Costin M, MD   carvedilol  (COREG ) tablet 12.5 mg, 12.5 mg, Oral, BID WC, Gherghe, Costin M, MD, 12.5 mg at 10/03/23 9195   dapagliflozin  propanediol (FARXIGA ) tablet 10 mg, 10 mg, Oral, Daily, Gherghe, Costin M, MD, 10 mg at 10/03/23 9195   ezetimibe  (ZETIA ) tablet 10 mg, 10 mg, Oral, Daily, Gherghe, Costin M, MD, 10 mg at 10/03/23 9195   feeding supplement (GLUCERNA SHAKE) (GLUCERNA SHAKE) liquid 237 mL, 237 mL, Oral, TID BM, Fausto Sor A, DO   [START ON 10/04/2023] glipiZIDE  (GLUCOTROL  XL) 24 hr tablet 10 mg, 10 mg, Oral, Q breakfast, Fausto, Kelly  A, DO   HYDROcodone -acetaminophen  (NORCO/VICODIN) 5-325 MG per tablet 1-2 tablet, 1-2 tablet, Oral, Q6H PRN, Gherghe, Costin M, MD, 2 tablet at 10/03/23 1436   insulin  aspart (novoLOG ) injection 0-5 Units, 0-5 Units, Subcutaneous, QHS, Gherghe, Costin M, MD   insulin  aspart (novoLOG ) injection 0-9 Units, 0-9 Units, Subcutaneous, TID WC, Gherghe, Costin M, MD, 3 Units at 10/03/23 1237   insulin  glargine (LANTUS ) injection 20 Units, 20 Units, Subcutaneous, Daily, Gherghe, Costin M, MD, 20 Units at 10/03/23 0805   magnesium hydroxide (MILK OF MAGNESIA) suspension 15 mL, 15 mL, Oral, Daily PRN, Fausto Sor A, DO   methocarbamol (ROBAXIN) tablet 500 mg, 500 mg, Oral, Q6H PRN, Danton Lauraine LABOR, PA-C, 500 mg at 10/03/23 1240   morphine  (PF) 2 MG/ML injection 0.5 mg, 0.5 mg, Intravenous, Q2H PRN, Gherghe, Costin M, MD, 0.5 mg at 10/02/23 2102   multivitamin with minerals tablet 1 tablet, 1 tablet, Oral, Daily, Gherghe, Costin M, MD, 1 tablet at 10/03/23 9196   ondansetron  (ZOFRAN ) tablet 4 mg, 4 mg, Oral, Q6H PRN **OR** ondansetron  (ZOFRAN ) injection 4 mg, 4 mg, Intravenous, Q6H PRN, Reyne Cordella SQUIBB, MD   polyethylene glycol (MIRALAX  / GLYCOLAX ) packet 17 g, 17 g, Oral, BID, Gherghe, Costin M, MD, 17 g at 10/03/23 0804   senna-docusate (Senokot-S) tablet 2 tablet, 2 tablet, Oral, BID, Gherghe, Costin M, MD, 2 tablet at 10/03/23 9195   spironolactone  (ALDACTONE ) tablet 12.5 mg, 12.5 mg, Oral, Daily, Trixie, Costin M, MD, 12.5 mg at 10/03/23 0803   zolpidem  (AMBIEN ) tablet 5 mg, 5 mg, Oral, QHS PRN,MR X 1, Reyne Cordella SQUIBB, MD, 5 mg at 10/01/23 2236     Patients Current Diet:  Diet Order                  Diet regular Room service appropriate? Yes; Fluid consistency: Thin  Diet effective now                         Precautions / Restrictions Precautions Precautions: Fall, Other (comment) Precaution/Restrictions Comments: wound vac Restrictions Weight Bearing Restrictions Per Provider  Order: No RLE Weight Bearing Per Provider Order: Weight bearing as tolerated Other Position/Activity Restrictions: Unrestricted RLE ROM    Has the patient had 2 or more falls or a fall with injury in the past year? Yes   Prior Activity Level Community (5-7x/wk): drives, works, gets out  of house often   Prior Functional Level Self Care: Did the patient need help bathing, dressing, using the toilet or eating? Independent   Indoor Mobility: Did the patient need assistance with walking from room to room (with or without device)? Independent   Stairs: Did the patient need assistance with internal or external stairs (with or without device)? Independent   Functional Cognition: Did the patient need help planning regular tasks such as shopping or remembering to take medications? Needed some help   Patient Information Are you of Hispanic, Latino/a,or Spanish origin?: A. No, not of Hispanic, Latino/a, or Spanish origin What is your race?: A. White Do you need or want an interpreter to communicate with a doctor or health care staff?: 0. No   Patient's Response To:  Health Literacy and Transportation Is the patient able to respond to health literacy and transportation needs?: Yes Health Literacy - How often do you need to have someone help you when you read instructions, pamphlets, or other written material from your doctor or pharmacy?: Never In the past 12 months, has lack of transportation kept you from medical appointments or from getting medications?: No In the past 12 months, has lack of transportation kept you from meetings, work, or from getting things needed for daily living?: No   Home Assistive Devices / Equipment Home Equipment: Agricultural consultant (2 wheels), [place] - single point, [employer] - quad, Occupational hygienist (4 wheels), Shower seat, Wheelchair - manual, BSC/3in1   Prior Device Use: Indicate devices/aids used by the patient prior to current illness, exacerbation or injury? None of the above    Current Functional Level Cognition   Orientation Level: Oriented X4    Extremity Assessment (includes Sensation/Coordination)   Upper Extremity Assessment: Overall WFL for tasks assessed, Right hand dominant  Lower Extremity Assessment: Defer to PT evaluation, RLE deficits/detail RLE Deficits / Details: edema noted at the knee and decreased knee extension compared to the L LE. pt reports hx of knee discomfort and need for knee replacment RLE: Unable to fully assess due to pain RLE Sensation: WNL RLE Coordination: decreased gross motor     ADLs   Overall ADL's : Needs assistance/impaired Eating/Feeding: Independent, Sitting Eating/Feeding Details (indicate cue type and reason): drinking and pouring soda into a cup Grooming: Wash/dry hands, Wash/dry face, Set up, Sitting Grooming Details (indicate cue type and reason): simulated Upper Body Bathing: Set up, Sitting Upper Body Bathing Details (indicate cue type and reason): simulated Lower Body Bathing: Maximal assistance, Sit to/from stand Upper Body Dressing : Set up, Sitting Lower Body Dressing: Sit to/from stand, With adaptive equipment, Cueing for safety, Maximal assistance Lower Body Dressing Details (indicate cue type and reason): pt able to use reacher to initate don of underwear. pt putting them on backward initially. pt requires max (A) to power up from chair surface. pt once standing needs (A) to steady to pull up bottoms with LOB . pt fearful of falling. pt requires OT don 3/4 of the way to allow the patietn to backward chain completing the task. Toilet Transfer: Moderate assistance, Stand-pivot, BSC/3in1, Rolling walker (2 wheels) Toileting- Clothing Manipulation and Hygiene: Moderate assistance, Sit to/from stand Functional mobility during ADLs: Moderate assistance, Rolling walker (2 wheels), Cueing for sequencing (ambulate short distance from bed to chair) General ADL Comments: sit<>stand x2 with focus on R knee extension,  hip extension, full upright trunk and head up  ( posture). pt with good return demo with tactile input and one step commands  Mobility   Overal bed mobility: Needs Assistance Bed Mobility: Supine to Sit Supine to sit: Contact guard General bed mobility comments: pt able to self mobilize RLE by hooking L toes under R ankle and bring to and off EOB and elevate trunk without assist     Transfers   Overall transfer level: Needs assistance Equipment used: Rolling walker (2 wheels) Transfers: Sit to/from Stand Sit to Stand: Min assist, From elevated surface Bed to/from chair/wheelchair/BSC transfer type:: Step pivot Step pivot transfers: Mod assist General transfer comment: cues to scoot out to EOB, min A to rise from elevated EOB, cues for trunk and LE extension as well as holding RW with palms and not between finger     Ambulation / Gait / Stairs / Wheelchair Mobility   Ambulation/Gait Ambulation/Gait assistance: Editor, commissioning (Feet): 6 Feet Assistive device: Rolling walker (2 wheels) Gait Pattern/deviations: Step-to pattern, Decreased step length - right, Decreased step length - left, Decreased stance time - right, Decreased weight shift to right, Knee flexed in stance - right, Antalgic, Trunk flexed General Gait Details: Pt took short slow labored steps with heavy reliance on BUE support on RW. improved ability to advance RLE Gait velocity: decr     Posture / Balance Dynamic Sitting Balance Sitting balance - Comments: Increased right lean in sitting, needing UE support to maintain Balance Overall balance assessment: Needs assistance Sitting-balance support: Bilateral upper extremity supported, Feet supported Sitting balance-Leahy Scale: Poor Sitting balance - Comments: Increased right lean in sitting, needing UE support to maintain Standing balance support: Bilateral upper extremity supported, During functional activity, Reliant on assistive device for balance Standing  balance-Leahy Scale: Poor Standing balance comment: reliant on RW support, pt able to maintain standing for majortiy of session     Special considerations/life events  Skin Surgical Incision: hip/right and Diabetic management Novolog  0-9 units 3x daily with meals; Novolog  0-5 units daily at bedtime; Lantus  20 units daily    Previous Home Environment (from acute therapy documentation) Living Arrangements: Spouse/significant other Available Help at Discharge: Available 24 hours/day, Family Type of Home: House Home Layout: One level Home Access: Stairs to enter, Level entry Entrance Stairs-Rails: None Entrance Stairs-Number of Steps: 3 (can walk around back to where no steps are at) [employer] Shower/Tub: [employer], Engineer, manufacturing systems: Standard Bathroom Accessibility: No Home Care Services: No   Discharge Living Setting Plans for Discharge Living Setting: Patient's home Type of Home at Discharge: House Discharge Home Layout: One level (split level) Discharge Home Access: Stairs to enter (Stairs in front. No stairs in back.) Entrance Stairs-Rails: None Entrance Stairs-Number of Steps: 3 Discharge Bathroom Shower/Tub: Walk-in shower, Tub/shower unit Discharge Bathroom Toilet: Standard Discharge Bathroom Accessibility: No Does the patient have any problems obtaining your medications?: No   Social/Family/Support Systems Anticipated Caregiver: Nixon Kolton, wife Anticipated Caregiver's Contact Information: [phone] Caregiver Availability: 24/7 Discharge Plan Discussed with Primary Caregiver: Yes Is Caregiver In Agreement with Plan?: Yes Does Caregiver/Family have Issues with Lodging/Transportation while Pt is in Rehab?: No   Goals Patient/Family Goal for Rehab: PT/OT supervision to mod I, SLP n/a Expected length of stay: 12-14 days Pt/Family Agrees to Admission and willing to participate: Yes Program Orientation Provided & Reviewed with Pt/Caregiver Including  Roles  & Responsibilities: Yes   Decrease burden of Care through IP rehab admission: NA   Possible need for SNF placement upon discharge: Not anticipated   Patient Condition: I have reviewed medical records from [facility], spoken with CM,  and patient and spouse. I met with patient at the bedside and discussed via phone for inpatient rehabilitation assessment.  Patient will benefit from ongoing PT and OT, can actively participate in 3 hours of therapy a day 5 days of the week, and can make measurable gains during the admission.  Patient will also benefit from the coordinated team approach during an Inpatient Acute Rehabilitation admission.  The patient will receive intensive therapy as well as Rehabilitation physician, nursing, social worker, and care management interventions.  Due to safety, skin/wound care, disease management, medication administration, pain management, and patient education the patient requires 24 hour a day rehabilitation nursing.  The patient is currently max assist with mobility and basic ADLs.  Discharge setting and therapy post discharge at home with home health is anticipated.  Patient has agreed to participate in the Acute Inpatient Rehabilitation Program and will admit today.   Preadmission Screen Completed By:  Tinnie SHAUNNA Yvone Delayne with brief updates by Reche Lowers, PT, DPT, 10/03/2023 3:48 PM ______________________________________________________________________   Discussed status with Dr. Babs on 10/04/23 at 10:25 AM  and received approval for admission today.   Admission Coordinator:  Tinnie SHAUNNA Yvone Delayne, CCC-SLP, time 10:25 AM Pattricia 10/04/23     Assessment/Plan: Diagnosis: right hip fx Does the need for close, 24 hr/day Medical supervision in concert with the patient's rehab needs make it unreasonable for this patient to be served in a less intensive setting? Yes Co-Morbidities requiring supervision/potential complications: CHF, HTN, DM, CVA Due to  bladder management, bowel management, safety, skin/wound care, disease management, medication administration, pain management, and patient education, does the patient require 24 hr/day rehab nursing? Yes Does the patient require coordinated care of a physician, rehab nurse, PT, OT to address physical and functional deficits in the context of the above medical diagnosis(es)? Yes Addressing deficits in the following areas: balance, endurance, locomotion, strength, transferring, bowel/bladder control, bathing, dressing, feeding, grooming, toileting, and psychosocial support Can the patient actively participate in an intensive therapy program of at least 3 hrs of therapy 5 days a week? Yes The potential for patient to make measurable gains while on inpatient rehab is excellent Anticipated functional outcomes upon discharge from inpatient rehab: supervision to mod I PT, supervision to modified independent OT, n/a SLP Estimated rehab length of stay to reach the above functional goals is: 12-14 days Anticipated discharge destination: Home 10. Overall Rehab/Functional Prognosis: excellent     MD Signature: Arthea IVAR Babs, MD, [facility] Physical Medicine & Rehabilitation Medical Director Rehabilitation Services 10/04/2023           Revision History

## 2023-10-04 NOTE — Progress Notes (Signed)
 Physical Therapy Treatment Patient Details Name: John [name] MRN: [id number] DOB: May 27, 1960 Today's Date: 10/04/2023   History of Present Illness 63 y.o. male admitted 09/27/23 after ground-level fall sustaining right displaced intertrochanteric hip fracture. Pt s/p IM rod 9/26. PMHx: CAD, ischemic cardiomyopathy, CVA, HTN, HLD, T2DM,  arthritis, and chronic systolic CHF with an ICD implant.    PT Comments  Pt resting in bed on arrival, pleasant and motivated for session. Pt demonstrating continued progress towards acute goals, however continues to be limited by RLE pain/weakness limiting weight bearing and standing tolerance. Pt progressing gait distance slightly this session with RW for support and min A to maintain balance with noted improvement in RLE clearance with cues. Pt continues to require max cues for safe hand placement on RW throughout standing activity. Pt up in chair at end of session and demonstrating verbalizing understanding of education on exercises to complete throughout day between therapies. Patient will benefit from intensive inpatient follow-up therapy, >3 hours/day, will continue to follow acutely.    If plan is discharge home, recommend the following: Two people to help with walking and/or transfers;A lot of help with bathing/dressing/bathroom;Assistance with cooking/housework;Assist for transportation;Help with stairs or ramp for entrance   Can travel by private vehicle        Equipment Recommendations  None recommended by PT (Pt already has DME)    Recommendations for Other Services       Precautions / Restrictions Precautions Precautions: Fall;Other (comment) Recall of Precautions/Restrictions: Impaired Precaution/Restrictions Comments: wound vac Restrictions Weight Bearing Restrictions Per Provider Order: No RLE Weight Bearing Per Provider Order: Weight bearing as tolerated     Mobility  Bed Mobility Overal bed mobility: Needs Assistance Bed  Mobility: Supine to Sit     Supine to sit: Contact guard     General bed mobility comments: pt able to self mobilize RLE by hooking L toes under R ankle and bringing to and off EOB and elevate trunk without assist with increased time    Transfers Overall transfer level: Needs assistance Equipment used: Rolling walker (2 wheels) Transfers: Sit to/from Stand Sit to Stand: Min assist, From elevated surface           General transfer comment: cues to scoot out to EOB and for hand placement on rise, light min A to steady    Ambulation/Gait Ambulation/Gait assistance: Min assist Gait Distance (Feet): 15 Feet Assistive device: Rolling walker (2 wheels) Gait Pattern/deviations: Step-to pattern, Decreased step length - right, Decreased step length - left, Decreased stance time - right, Decreased weight shift to right, Knee flexed in stance - right, Antalgic, Trunk flexed Gait velocity: decr     General Gait Details: Pt took short slow labored steps with heavy reliance on BUE support on RW. Improved ability to clear and advance RLE, max cues to hold RW with palms and not between fingers for safety   Stairs             Wheelchair Mobility     Tilt Bed    Modified Rankin (Stroke Patients Only)       Balance Overall balance assessment: Needs assistance Sitting-balance support: Bilateral upper extremity supported, Feet supported Sitting balance-Leahy Scale: Poor Sitting balance - Comments: Increased right lean in sitting, needing UE support to maintain   Standing balance support: Bilateral upper extremity supported, During functional activity, Reliant on assistive device for balance Standing balance-Leahy Scale: Poor Standing balance comment: reliant on RW support, pt able to maintain standing for  majortiy of session                            Communication Communication Communication: Impaired Factors Affecting Communication: Hearing impaired  Cognition  Arousal: Alert Behavior During Therapy: WFL for tasks assessed/performed   PT - Cognitive impairments: No apparent impairments                         Following commands: Intact      Cueing Cueing Techniques: Verbal cues, Tactile cues  Exercises General Exercises - Lower Extremity Ankle Circles/Pumps: AROM, Both, 10 reps Quad Sets: AROM, Right, 10 reps    General Comments General comments (skin integrity, edema, etc.): wound vac dressing dry and intact      Pertinent Vitals/Pain Pain Assessment Pain Assessment: Faces Faces Pain Scale: Hurts little more Pain Location: R hip Pain Descriptors / Indicators: Operative site guarding, Grimacing, Moaning, Aching, Discomfort, Sharp Pain Intervention(s): Monitored during session, Limited activity within patient's tolerance, Repositioned    Home Living                          Prior Function            PT Goals (current goals can now be found in the care plan section) Acute Rehab PT Goals Patient Stated Goal: to get better and go home PT Goal Formulation: With patient Time For Goal Achievement: 10/12/23 Progress towards PT goals: Progressing toward goals    Frequency    Min 3X/week      PT Plan      Co-evaluation              AM-PAC PT 6 Clicks Mobility   Outcome Measure  Help needed turning from your back to your side while in a flat bed without using bedrails?: A Lot Help needed moving from lying on your back to sitting on the side of a flat bed without using bedrails?: A Lot Help needed moving to and from a bed to a chair (including a wheelchair)?: A Lot Help needed standing up from a chair using your arms (e.g., wheelchair or bedside chair)?: A Lot Help needed to walk in hospital room?: Total (<20') Help needed climbing 3-5 steps with a railing? : Total 6 Click Score: 10    End of Session Equipment Utilized During Treatment: Gait belt Activity Tolerance: Patient limited by  pain Patient left: in chair;with call bell/phone within reach Nurse Communication: Mobility status PT Visit Diagnosis: Pain;Difficulty in walking, not elsewhere classified (R26.2);Other abnormalities of gait and mobility (R26.89);Unsteadiness on feet (R26.81) Pain - Right/Left: Right Pain - part of body: Hip;Knee     Time: 9144-9088 PT Time Calculation (min) (ACUTE ONLY): 16 min  Charges:    $Gait Training: 8-22 mins PT General Charges $$ ACUTE PT VISIT: 1 Visit                     Mckinzie Saksa R. PTA Acute Rehabilitation Services Office: [phone]   Therisa CHRISTELLA Boor 10/04/2023, 9:16 AM

## 2023-10-04 NOTE — H&P (Signed)
 Physical Medicine and Rehabilitation Admission H&P        Chief Complaint  Patient presents with   Functional deficits due to debility      HPI: John [name] is a 63 year old male with PMHx chronic systolic CHF, ICD implant, hypertension, hyperlipidemia, DM type [name], CAD, CVA on Eliquis .  The patient presented to [facility] on 09/27/2023 with complaints of right hip pain after suffering a ground level fall at work.  CT head was negative for acute abnormalities and hip x-ray revealed right trochanteric hip fracture.  Orthopedics was consulted and the patient underwent Entercote trochanteric fixation with IM rod by Dr.Gebauer on the same day. Post op CT of right hip on 10/1 reveled small, hematomas seen in the gluteus medius and minus. Hospital course complicated by ABLA, Eliquis  has since been held. Hemoglobin stabilized hemoglobin 8.6 from yesterday 8.8. Wound VAC place to the right hip and to remain under the direction of orthopedics who continues to follow closely.    Prior to arrival independent with ADLs and active in the community driving/working.  Currently he requires mod assist to toilet and for transfers, mod assistance for functional mobility with rolling walker cueing for sequencing and is able to ambulate short distance from bed to chair. Therapy evaluations completed due to patient decreased functional mobility was admitted for a comprehensive rehab program.          Review of Systems  Constitutional:  Negative for fever.  HENT: Negative.    Eyes: Negative.   Respiratory: Negative.    Cardiovascular: Negative.   Gastrointestinal:  Positive for constipation. Negative for abdominal pain, nausea and vomiting.  Genitourinary: Negative.  Negative for dysuria.  Musculoskeletal:  Positive for joint pain and myalgias.  Skin: Negative.   Neurological:  Positive for weakness.  Psychiatric/Behavioral:  Negative for depression.        Past Medical History:  Diagnosis Date    AICD (automatic cardioverter/defibrillator) present     Arthritis     Diabetes mellitus without complication (HCC)     Hyperlipidemia     Hypertension     Stroke (HCC) 05/2020             Past Surgical History:  Procedure Laterality Date   ICD IMPLANT N/A 06/02/2021    Procedure: ICD IMPLANT;  Surgeon: Fernande Elspeth BROCKS, MD;  Location: [facility] INVASIVE CV LAB;  Service: Cardiovascular;  Laterality: N/A;   INTRAMEDULLARY (IM) NAIL INTERTROCHANTERIC Right 09/27/2023    Procedure: FIXATION, FRACTURE, INTERTROCHANTERIC, WITH INTRAMEDULLARY ROD;  Surgeon: Reyne Cordella SQUIBB, MD;  Location: [facility] OR;  Service: Orthopedics;  Laterality: Right;   LEFT HEART CATH AND CORONARY ANGIOGRAPHY N/A 08/18/2020    Procedure: LEFT HEART CATH AND CORONARY ANGIOGRAPHY;  Surgeon: [place], Peter M, MD;  Location: [facility] INVASIVE CV LAB;  Service: Cardiovascular;  Laterality: N/A;   TONSILLECTOMY                 Family History  Problem Relation Age of Onset   Diabetes Mother     CAD Mother     Heart attack Mother          x 2   COPD Father     Colon cancer Neg Hx     Esophageal cancer Neg Hx     Rectal cancer Neg Hx     Stomach cancer Neg Hx          Social History:  reports that he has quit smoking. He  has never used smokeless tobacco. He reports current alcohol use. He reports that he does not use drugs. Allergies:  Allergies  No Known Allergies            Facility-Administered Medications Prior to Admission  Medication Dose Route Frequency Provider Last Rate Last Admin   sodium chloride  flush (NS) 0.9 % injection 3 mL  3 mL Intravenous Q12H Meng, Hao, PA                Medications Prior to Admission  Medication Sig Dispense Refill   acetaminophen  (TYLENOL ) 500 MG tablet Take 500 mg by mouth every 6 (six) hours as needed for moderate pain or headache.       [Paused] apixaban  (ELIQUIS ) 5 MG TABS tablet TAKE 1 TABLET BY MOUTH 2 TIMES A DAY 60 tablet 5   Aspirin -Caffeine (BC FAST PAIN RELIEF PO) Take 1  Package by mouth daily as needed (Pain).       carvedilol  (COREG ) 12.5 MG tablet TAKE ONE TABLET BY MOUTH TWICE A DAY WITH A MEAL 180 tablet 3   dapagliflozin  propanediol (FARXIGA ) 10 MG TABS tablet Take 1 tablet (10 mg total) by mouth daily. 90 tablet 3   diclofenac  Sodium (VOLTAREN ) 1 % GEL Rub a small grape sized dollop into knee up to 3 times daily as needed. 350 g 1   diphenhydrAMINE HCl, Sleep, (ZZZQUIL PO) Take 1 Dose by mouth at bedtime as needed (Sleep).       Evolocumab  (REPATHA  SURECLICK) 140 MG/ML SOAJ INJECT 1 MILLILITER UNDER THE SKIN EVERY 14 DAYS 6 mL 3   ezetimibe  (ZETIA ) 10 MG tablet Take 1 tablet (10 mg total) by mouth daily. 90 tablet 3   glipiZIDE  (GLUCOTROL  XL) 10 MG 24 hr tablet Take 1 tablet (10 mg total) by mouth daily with breakfast. 90 tablet 3   [Paused] insulin  glargine (LANTUS  SOLOSTAR) 100 UNIT/ML Solostar Pen Inject 45 Units into the skin every morning. And pen needles 1/day (Patient taking differently: Inject 40 Units into the skin at bedtime.) 45 mL 4   [Paused] sacubitril-valsartan (ENTRESTO ) 24-26 MG TAKE 1 TABLET BY MOUTH TWICE A DAY 60 tablet 3   sildenafil  (VIAGRA ) 100 MG tablet Take 0.5-1 tablets (50-100 mg total) by mouth daily as needed for erectile dysfunction. 5 tablet 2   spironolactone  (ALDACTONE ) 25 MG tablet TAKE 1/2 TABLET BY MOUTH DAILY 45 tablet 3   tirzepatide  (MOUNJARO ) 12.5 MG/0.5ML Pen Inject 12.5 mg into the skin once a week. 6 mL 3            Home: Home Living Family/patient expects to be discharged to:: Private residence Living Arrangements: Spouse/significant other Available Help at Discharge: Available 24 hours/day, Family Type of Home: House Home Access: Stairs to enter, Level entry Entrance Stairs-Number of Steps: 3 (can walk around back to where no steps are at) Entrance Stairs-Rails: None Home Layout: One level Bathroom Shower/Tub: Psychologist, counselling, Engineer, manufacturing systems: Standard Bathroom Accessibility: No Home  Equipment: Agricultural consultant (2 wheels), [employer] - single point, [employer] - quad, Rollator (4 wheels), Shower seat, Wheelchair - manual, BSC/3in1   Functional History: Prior Function Prior Level of Function : Independent/Modified Independent, Driving, Working/employed Mobility Comments: Ambulates without AD mainly. He occasionally used a SPC when his knees were bothering him. 1 fall leading to current admission d/t trying to remove a pallet jack underneath a pallet and it gave out and threw him backwards. ADLs Comments: Indep with ADLs/IADLs. Driving. 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   Functional Status:  Mobility: Bed Mobility Overal bed mobility: Needs Assistance Bed Mobility: Supine to Sit Supine to sit: Contact guard General bed mobility comments: pt able to self mobilize RLE by hooking L toes under R ankle and bringing to and off EOB and elevate trunk without assist with increased time Transfers Overall transfer level: Needs assistance Equipment used: Rolling walker (2 wheels) Transfers: Sit to/from Stand Sit to Stand: Min assist, From elevated surface Bed to/from chair/wheelchair/BSC transfer type:: Step pivot Step pivot transfers: Mod assist General transfer comment: cues to scoot out to EOB and for hand placement on rise, light min A to steady Ambulation/Gait Ambulation/Gait assistance: Min assist Gait Distance (Feet): 15 Feet Assistive device: Rolling walker (2 wheels) Gait Pattern/deviations: Step-to pattern, Decreased step length - right, Decreased step length - left, Decreased stance time - right, Decreased weight shift to right, Knee flexed in stance - right, Antalgic, Trunk flexed General Gait Details: Pt took short slow labored steps with heavy reliance on BUE support on RW. Improved ability to clear and advance RLE, max cues to hold RW with palms and not between fingers for safety Gait velocity: decr   ADL: ADL Overall ADL's : Needs  assistance/impaired Eating/Feeding: Independent, Sitting Eating/Feeding Details (indicate cue type and reason): drinking and pouring soda into a cup Grooming: Wash/dry hands, Wash/dry face, Set up, Sitting Grooming Details (indicate cue type and reason): simulated Upper Body Bathing: Set up, Sitting Upper Body Bathing Details (indicate cue type and reason): simulated Lower Body Bathing: Maximal assistance, Sit to/from stand Upper Body Dressing : Set up, Sitting Lower Body Dressing: Sit to/from stand, With adaptive equipment, Cueing for safety, Maximal assistance Lower Body Dressing Details (indicate cue type and reason): pt able to use reacher to initate don of underwear. pt putting them on backward initially. pt requires max (A) to power up from chair surface. pt once standing needs (A) to steady to pull up bottoms with LOB . pt fearful of falling. pt requires OT don 3/4 of the way to allow the patietn to backward chain completing the task. Toilet Transfer: Moderate assistance, Stand-pivot, BSC/3in1, Rolling walker (2 wheels) Toileting- Clothing Manipulation and Hygiene: Moderate assistance, Sit to/from stand Functional mobility during ADLs: Moderate assistance, Rolling walker (2 wheels), Cueing for sequencing (ambulate short distance from bed to chair) General ADL Comments: sit<>stand x2 with focus on R knee extension, hip extension, full upright trunk and head up  ( posture). pt with good return demo with tactile input and one step commands   Cognition: Cognition Orientation Level: Oriented X4 Cognition Arousal: Alert Behavior During Therapy: WFL for tasks assessed/performed   Physical Exam: Blood pressure 113/76, pulse 93, temperature 98.1 F (36.7 C), temperature source Oral, resp. rate 19, height 5' 7 (1.702 m), weight 74.8 kg, SpO2 98%. Physical Exam Constitutional:      General: He is not in acute distress.    Appearance: He is not ill-appearing.  HENT:     Right Ear: External  ear normal.     Left Ear: External ear normal.     Nose: Nose normal.     Mouth/Throat:     Mouth: Mucous membranes are moist.  Eyes:     Conjunctiva/sclera: Conjunctivae normal.  Cardiovascular:     Rate and Rhythm: Normal rate and regular rhythm.     Heart sounds: No murmur heard.    No gallop.  Pulmonary:     Effort: Pulmonary effort is normal. No  respiratory distress.  Abdominal:     General: Bowel sounds are normal. There is no distension.     Tenderness: There is no abdominal tenderness.  Musculoskeletal:        General: Swelling and tenderness (right hip) present. Normal range of motion.     Cervical back: Normal range of motion.  Skin:    Comments: Distal surgical incisions clean and intact with minimal drainage, foam dressing. Proximal incision with vac in place, serosanguinous drainage present in can. Bruising present  Neurological:     Mental Status: He is alert.     Comments: Alert and oriented x 3. Normal insight and awareness. Intact Memory. Normal language and speech. Cranial nerve exam unremarkable. MMT: BUE 5/5. RLE limited by pain proximally but 4+/5 ADF/PF. LLE 4/5 prox to 5/5 distally. Sensory exam normal for light touch and pain in all 4 limbs. No limb ataxia or cerebellar signs. No abnormal tone appreciated.  SABRA    Psychiatric:        Mood and Affect: Mood normal.        Behavior: Behavior normal.       Lab Results Last 48 Hours        Results for orders placed or performed during the hospital encounter of 09/27/23 (from the past 48 hours)  Glucose, capillary     Status: Abnormal    Collection Time: 10/02/23  1:05 PM  Result Value Ref Range    Glucose-Capillary 297 (H) 70 - 99 mg/dL      Comment: Glucose reference range applies only to samples taken after fasting for at least 8 hours.  Glucose, capillary     Status: Abnormal    Collection Time: 10/02/23  4:51 PM  Result Value Ref Range    Glucose-Capillary 236 (H) 70 - 99 mg/dL      Comment: Glucose  reference range applies only to samples taken after fasting for at least 8 hours.  Glucose, capillary     Status: Abnormal    Collection Time: 10/02/23  9:00 PM  Result Value Ref Range    Glucose-Capillary 195 (H) 70 - 99 mg/dL      Comment: Glucose reference range applies only to samples taken after fasting for at least 8 hours.  CBC     Status: Abnormal    Collection Time: 10/03/23  4:29 AM  Result Value Ref Range    WBC 10.8 (H) 4.0 - 10.5 K/uL    RBC 2.84 (L) 4.22 - 5.81 MIL/uL    Hemoglobin 8.8 (L) 13.0 - 17.0 g/dL    HCT 73.7 (L) 60.9 - 52.0 %    MCV 92.3 80.0 - 100.0 fL    MCH 31.0 26.0 - 34.0 pg    MCHC 33.6 30.0 - 36.0 g/dL    RDW 87.5 88.4 - 84.4 %    Platelets 337 150 - 400 K/uL    nRBC 0.0 0.0 - 0.2 %      Comment: Performed at [facility], [address], [place], [place] [address]  Basic metabolic panel with GFR     Status: Abnormal    Collection Time: 10/03/23  4:29 AM  Result Value Ref Range    Sodium 132 (L) 135 - 145 mmol/L    Potassium 4.0 3.5 - 5.1 mmol/L    Chloride 100 98 - 111 mmol/L    CO2 24 22 - 32 mmol/L    Glucose, Bld 158 (H) 70 - 99 mg/dL  Comment: Glucose reference range applies only to samples taken after fasting for at least 8 hours.    BUN 29 (H) 8 - 23 mg/dL    Creatinine, Ser 8.97 0.61 - 1.24 mg/dL    Calcium  8.3 (L) 8.9 - 10.3 mg/dL    GFR, Estimated >39 >39 mL/min      Comment: (NOTE) Calculated using the CKD-EPI Creatinine Equation (2021)      Anion gap 8 5 - 15      Comment: Performed at [facility], [address] [place], [place] [address]  Glucose, capillary     Status: Abnormal    Collection Time: 10/03/23  6:08 AM  Result Value Ref Range    Glucose-Capillary 206 (H) 70 - 99 mg/dL      Comment: Glucose reference range applies only to samples taken after fasting for at least 8 hours.  Glucose, capillary     Status: Abnormal    Collection Time: 10/03/23  7:53 AM  Result Value Ref Range    Glucose-Capillary 146  (H) 70 - 99 mg/dL      Comment: Glucose reference range applies only to samples taken after fasting for at least 8 hours.  Glucose, capillary     Status: Abnormal    Collection Time: 10/03/23 11:53 AM  Result Value Ref Range    Glucose-Capillary 232 (H) 70 - 99 mg/dL      Comment: Glucose reference range applies only to samples taken after fasting for at least 8 hours.  Glucose, capillary     Status: Abnormal    Collection Time: 10/03/23  5:16 PM  Result Value Ref Range    Glucose-Capillary 194 (H) 70 - 99 mg/dL      Comment: Glucose reference range applies only to samples taken after fasting for at least 8 hours.  Glucose, capillary     Status: Abnormal    Collection Time: 10/03/23  9:23 PM  Result Value Ref Range    Glucose-Capillary 242 (H) 70 - 99 mg/dL      Comment: Glucose reference range applies only to samples taken after fasting for at least 8 hours.  CBC     Status: Abnormal    Collection Time: 10/04/23  4:08 AM  Result Value Ref Range    WBC 11.0 (H) 4.0 - 10.5 K/uL    RBC 2.71 (L) 4.22 - 5.81 MIL/uL    Hemoglobin 8.6 (L) 13.0 - 17.0 g/dL    HCT 74.6 (L) 60.9 - 52.0 %    MCV 93.4 80.0 - 100.0 fL    MCH 31.7 26.0 - 34.0 pg    MCHC 34.0 30.0 - 36.0 g/dL    RDW 87.2 88.4 - 84.4 %    Platelets 372 150 - 400 K/uL    nRBC 0.4 (H) 0.0 - 0.2 %      Comment: Performed at [facility], [address] [place], [place] [address]  Basic metabolic panel with GFR     Status: Abnormal    Collection Time: 10/04/23  4:08 AM  Result Value Ref Range    Sodium 137 135 - 145 mmol/L    Potassium 4.0 3.5 - 5.1 mmol/L    Chloride 102 98 - 111 mmol/L    CO2 24 22 - 32 mmol/L    Glucose, Bld 139 (H) 70 - 99 mg/dL      Comment: Glucose reference range applies only to samples taken after fasting for at least 8 hours.  BUN 25 (H) 8 - 23 mg/dL    Creatinine, Ser 8.98 0.61 - 1.24 mg/dL    Calcium  8.6 (L) 8.9 - 10.3 mg/dL    GFR, Estimated >39 >39 mL/min      Comment:  (NOTE) Calculated using the CKD-EPI Creatinine Equation (2021)      Anion gap 11 5 - 15      Comment: Performed at [facility], [address], [place], [place] [address]  Glucose, capillary     Status: Abnormal    Collection Time: 10/04/23  6:09 AM  Result Value Ref Range    Glucose-Capillary 134 (H) 70 - 99 mg/dL      Comment: Glucose reference range applies only to samples taken after fasting for at least 8 hours.  Glucose, capillary     Status: Abnormal    Collection Time: 10/04/23  8:07 AM  Result Value Ref Range    Glucose-Capillary 155 (H) 70 - 99 mg/dL      Comment: Glucose reference range applies only to samples taken after fasting for at least 8 hours.       Imaging Results (Last 48 hours)  CT HIP RIGHT W CONTRAST Result Date: 10/02/2023 EXAM: CT OF THE RIGHT HIP WITH IV CONTRAST 10/02/2023 12:58:00 PM TECHNIQUE: CT of the right hip was performed with the administration of intravenous contrast. Multiplanar reformatted images are provided for review. Automated exposure control, iterative reconstruction, and/or weight based adjustment of the mA/kV was utilized to reduce the radiation dose to as low as reasonably achievable. COMPARISON: Interoperative right hip spot images from 09/27/2023 and preoperative radiographs from 09/27/2023. CLINICAL HISTORY: Right hip fracture due to fall. FINDINGS: BONES: Right hip proximal femoral nail system in place traversing the comminuted intertrochanteric fracture with a single distal interlocking screw on the im nail component. Aside from the original intertrochanteric fracture, no new or specific complicating fracture is identified. Due to the comminution, there are anterior and posterior fragments of the greater trochanter which are not connected to the nail system. Comminuted lesser trochanteric fragments are displaced medially as on image 63 series 15 and likewise not connected to the nail system. No immediately adjacent pelvic fracture is  observed. No aggressive appearing osseous abnormality or periostitis. SOFT TISSUE: Expected gas tracts along regional gluteal musculature. A 3.0 x 2.7 cm hematoma in the right gluteus medius on image 34 of series 12 and a 4.3 x 2.4 cm hematoma along the margin between the gluteus medius and minimus on image 36 of series 12 likely represent incidental postoperative blood products. Edema tracks along regional fascial planes including the right iliopsoas, right gluteal and proximal anterior thigh musculature, and right hip adductor musculature. Subcutaneous edema noted lateral to the right hip, not unexpected in this setting. There is some fullness of the right piriformis muscle but no sciatic notch impingement is observed. No soft tissue mass. No compelling findings of abscess at this time. JOINT: No significant degenerative changes. No osseous erosions. INTRAPELVIC CONTENTS: Limited images of the intrapelvic contents are unremarkable. Right external iliac and femoral atherosclerosis is present without overt occlusion. IMPRESSION: 1. Comminuted right intertrochanteric fracture postop day 5 status post proximal femoral nail fixation without new fracture. 2. Small hematomas in the right gluteus medius and between the gluteus medius and minimus. No findings of active bleeding. No abscess identified. 3. Right external iliac and femoral atherosclerosis without occlusion. 4. Expected edema tracking along regional muscular tissues and subcutaneous tissues. Electronically signed by: Ryan Salvage MD 10/02/2023 06:46  PM EDT RP Workstation: [id number]           Blood pressure 113/76, pulse 93, temperature 98.1 F (36.7 C), temperature source Oral, resp. rate 19, height 5' 7 (1.702 m), weight 74.8 kg, SpO2 98%.   Medical Problem List and Plan: 1. Functional deficits secondary to right intertrochanteric hip fx after fall at work. S/p right IM rod on 09/27/23             -patient may not yet shower              -ELOS/Goals: 12-14 days, supervision to mod I goals with PT and OT 2.  Antithrombotics: -DVT/anticoagulation:  Mechanical: Antiembolism stockings, thigh (TED hose) Bilateral lower extremities Sequential compression devices, below knee Bilateral lower extremities             -antiplatelet therapy: Holding Eliquis  3. Pain Management: Tylenol , Robaxin, and Norco as needed 4. Mood/Behavior/Sleep: LCSW to follow for evaluation and support when available.              -antipsychotic agents: N/A             - Insomnia: Ambien  5 mg as needed 5. Neuropsych/cognition: This patient is capable of making decisions on his own behalf. 6. Skin/Wound Care: Maintain proximal incisional wound VAC per Ortho until drainage ceases. WOC Rn signed off             -foam dressing to lower incision             - Routine pressure-relief measures 7. Fluids/Electrolytes/Nutrition: Monitor intake and output and follow-up chemistries.             - Diet changed to carb modified-continue Glucerna             - Vitamin C and zinc 8. HLD: Zetia  10 mg 9. HTN: Coreg  12.5 mg holding Entresto              - Monitor blood pressures with increased activity 10. T2DM: Resumed home Glipizide  --Inpatient insulin  needs have been lower than at home             -CBG ACHS with SSI and bedtime coverage. Lantus  20 units BID              - Resume Mounjaro ?  11. CKD IIIa: Baseline creatinine 1.1-1.4.  Stable at 1.1-- Faxiga  12. Chronic systolic CHF: Monitor daily weights and volume status. Holding Entresto , continue Coreg .   13. ABLA: Hemoglobin down from 11.7> 9.8> 8.8> 8.6.  Eliquis  held since 10/1             - Monitor H&H closely. Transfuse for hemoglobin less than 8 d/t cardiac hx              -check hgb 10/4 14. Post-op Hematomas: Maintain incisional wound vac dressing until staying dry. Ortho to advise when to remove. (See above) 15. Slow transit constipation             -60 cc sorbitol tonight followed by SSE if no result              -LBM 9/26. Pt has been eating             -begin daily miralax  and senokot-s tomorrow     Daphne LITTIE Satterfield, NP 10/04/2023 I have personally performed a face to face diagnostic evaluation of this patient and formulated the key components of the plan.  Additionally, I have personally reviewed laboratory data, imaging studies,  as well as relevant notes and concur with the physician assistant's documentation above.  The patient's status has not changed from the original H&P.  Any changes in documentation from the acute care chart have been noted above.  Arthea IVAR Gunther, MD, LEELLEN

## 2023-10-04 NOTE — H&P (Signed)
 Physical Medicine and Rehabilitation Admission H&P    Chief Complaint  Patient presents with   Functional deficits due to debility     HPI: John [name] is a 63 year old male with PMHx chronic systolic CHF, ICD implant, hypertension, hyperlipidemia, DM type [name], CAD, CVA on Eliquis .  The patient presented to [facility] on 09/27/2023 with complaints of right hip pain after suffering a ground level fall at work.  CT head was negative for acute abnormalities and hip x-ray revealed right trochanteric hip fracture.  Orthopedics was consulted and the patient underwent Entercote trochanteric fixation with IM rod by Dr.Gebauer on the same day. Post op CT of right hip on 10/1 reveled small, hematomas seen in the gluteus medius and minus. Hospital course complicated by ABLA, Eliquis  has since been held. Hemoglobin stabilized hemoglobin 8.6 from yesterday 8.8. Wound VAC place to the right hip and to remain under the direction of orthopedics who continues to follow closely.   Prior to arrival independent with ADLs and active in the community driving/working.  Currently he requires mod assist to toilet and for transfers, mod assistance for functional mobility with rolling walker cueing for sequencing and is able to ambulate short distance from bed to chair. Therapy evaluations completed due to patient decreased functional mobility was admitted for a comprehensive rehab program.      Review of Systems  Constitutional:  Negative for fever.  HENT: Negative.    Eyes: Negative.   Respiratory: Negative.    Cardiovascular: Negative.   Gastrointestinal:  Positive for constipation. Negative for abdominal pain, nausea and vomiting.  Genitourinary: Negative.  Negative for dysuria.  Musculoskeletal:  Positive for joint pain and myalgias.  Skin: Negative.   Neurological:  Positive for weakness.  Psychiatric/Behavioral:  Negative for depression.    Past Medical History:  Diagnosis Date   AICD  (automatic cardioverter/defibrillator) present    Arthritis    Diabetes mellitus without complication (HCC)    Hyperlipidemia    Hypertension    Stroke (HCC) 05/2020   Past Surgical History:  Procedure Laterality Date   ICD IMPLANT N/A 06/02/2021   Procedure: ICD IMPLANT;  Surgeon: Fernande Elspeth BROCKS, MD;  Location: [facility] INVASIVE CV LAB;  Service: Cardiovascular;  Laterality: N/A;   INTRAMEDULLARY (IM) NAIL INTERTROCHANTERIC Right 09/27/2023   Procedure: FIXATION, FRACTURE, INTERTROCHANTERIC, WITH INTRAMEDULLARY ROD;  Surgeon: Reyne Cordella SQUIBB, MD;  Location: [facility] OR;  Service: Orthopedics;  Laterality: Right;   LEFT HEART CATH AND CORONARY ANGIOGRAPHY N/A 08/18/2020   Procedure: LEFT HEART CATH AND CORONARY ANGIOGRAPHY;  Surgeon: [place], Peter M, MD;  Location: [facility] INVASIVE CV LAB;  Service: Cardiovascular;  Laterality: N/A;   TONSILLECTOMY     Family History  Problem Relation Age of Onset   Diabetes Mother    CAD Mother    Heart attack Mother        x 2   COPD Father    Colon cancer Neg Hx    Esophageal cancer Neg Hx    Rectal cancer Neg Hx    Stomach cancer Neg Hx    Social History:  reports that he has quit smoking. He has never used smokeless tobacco. He reports current alcohol use. He reports that he does not use drugs. Allergies: No Known Allergies Facility-Administered Medications Prior to Admission  Medication Dose Route Frequency Provider Last Rate Last Admin   sodium chloride  flush (NS) 0.9 % injection 3 mL  3 mL Intravenous Q12H Meng, Hao, [place]  Medications Prior to Admission  Medication Sig Dispense Refill   acetaminophen  (TYLENOL ) 500 MG tablet Take 500 mg by mouth every 6 (six) hours as needed for moderate pain or headache.     [Paused] apixaban  (ELIQUIS ) 5 MG TABS tablet TAKE 1 TABLET BY MOUTH 2 TIMES A DAY 60 tablet 5   Aspirin -Caffeine (BC FAST PAIN RELIEF PO) Take 1 Package by mouth daily as needed (Pain).     carvedilol  (COREG ) 12.5 MG tablet TAKE ONE TABLET BY  MOUTH TWICE A DAY WITH A MEAL 180 tablet 3   dapagliflozin  propanediol (FARXIGA ) 10 MG TABS tablet Take 1 tablet (10 mg total) by mouth daily. 90 tablet 3   diclofenac  Sodium (VOLTAREN ) 1 % GEL Rub a small grape sized dollop into knee up to 3 times daily as needed. 350 g 1   diphenhydrAMINE HCl, Sleep, (ZZZQUIL PO) Take 1 Dose by mouth at bedtime as needed (Sleep).     Evolocumab  (REPATHA  SURECLICK) 140 MG/ML SOAJ INJECT 1 MILLILITER UNDER THE SKIN EVERY 14 DAYS 6 mL 3   ezetimibe  (ZETIA ) 10 MG tablet Take 1 tablet (10 mg total) by mouth daily. 90 tablet 3   glipiZIDE  (GLUCOTROL  XL) 10 MG 24 hr tablet Take 1 tablet (10 mg total) by mouth daily with breakfast. 90 tablet 3   [Paused] insulin  glargine (LANTUS  SOLOSTAR) 100 UNIT/ML Solostar Pen Inject 45 Units into the skin every morning. And pen needles 1/day (Patient taking differently: Inject 40 Units into the skin at bedtime.) 45 mL 4   [Paused] sacubitril-valsartan (ENTRESTO ) 24-26 MG TAKE 1 TABLET BY MOUTH TWICE A DAY 60 tablet 3   sildenafil  (VIAGRA ) 100 MG tablet Take 0.5-1 tablets (50-100 mg total) by mouth daily as needed for erectile dysfunction. 5 tablet 2   spironolactone  (ALDACTONE ) 25 MG tablet TAKE 1/2 TABLET BY MOUTH DAILY 45 tablet 3   tirzepatide  (MOUNJARO ) 12.5 MG/0.5ML Pen Inject 12.5 mg into the skin once a week. 6 mL 3      Home: Home Living Family/patient expects to be discharged to:: Private residence Living Arrangements: Spouse/significant other Available Help at Discharge: Available 24 hours/day, Family Type of Home: House Home Access: Stairs to enter, Level entry Entrance Stairs-Number of Steps: 3 (can walk around back to where no steps are at) Entrance Stairs-Rails: None Home Layout: One level Bathroom Shower/Tub: Psychologist, counselling, Engineer, manufacturing systems: Standard Bathroom Accessibility: No Home Equipment: Agricultural consultant (2 wheels), [employer] - single point, [employer] - quad, Rollator (4 wheels), Shower seat,  Wheelchair - manual, BSC/3in1   Functional History: Prior Function Prior Level of Function : Independent/Modified Independent, Driving, Working/employed Mobility Comments: Ambulates without AD mainly. He occasionally used a SPC when his knees were bothering him. 1 fall leading to current admission d/t trying to remove a pallet jack underneath a pallet and it gave out and threw him backwards. ADLs Comments: Indep with ADLs/IADLs. Driving. 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  Functional Status:  Mobility: Bed Mobility Overal bed mobility: Needs Assistance Bed Mobility: Supine to Sit Supine to sit: Contact guard General bed mobility comments: pt able to self mobilize RLE by hooking L toes under R ankle and bringing to and off EOB and elevate trunk without assist with increased time Transfers Overall transfer level: Needs assistance Equipment used: Rolling walker (2 wheels) Transfers: Sit to/from Stand Sit to Stand: Min assist, From elevated surface Bed to/from chair/wheelchair/BSC transfer type:: Step pivot Step pivot transfers: Mod assist General transfer  comment: cues to scoot out to EOB and for hand placement on rise, light min A to steady Ambulation/Gait Ambulation/Gait assistance: Min assist Gait Distance (Feet): 15 Feet Assistive device: Rolling walker (2 wheels) Gait Pattern/deviations: Step-to pattern, Decreased step length - right, Decreased step length - left, Decreased stance time - right, Decreased weight shift to right, Knee flexed in stance - right, Antalgic, Trunk flexed General Gait Details: Pt took short slow labored steps with heavy reliance on BUE support on RW. Improved ability to clear and advance RLE, max cues to hold RW with palms and not between fingers for safety Gait velocity: decr    ADL: ADL Overall ADL's : Needs assistance/impaired Eating/Feeding: Independent, Sitting Eating/Feeding Details (indicate cue type and reason):  drinking and pouring soda into a cup Grooming: Wash/dry hands, Wash/dry face, Set up, Sitting Grooming Details (indicate cue type and reason): simulated Upper Body Bathing: Set up, Sitting Upper Body Bathing Details (indicate cue type and reason): simulated Lower Body Bathing: Maximal assistance, Sit to/from stand Upper Body Dressing : Set up, Sitting Lower Body Dressing: Sit to/from stand, With adaptive equipment, Cueing for safety, Maximal assistance Lower Body Dressing Details (indicate cue type and reason): pt able to use reacher to initate don of underwear. pt putting them on backward initially. pt requires max (A) to power up from chair surface. pt once standing needs (A) to steady to pull up bottoms with LOB . pt fearful of falling. pt requires OT don 3/4 of the way to allow the patietn to backward chain completing the task. Toilet Transfer: Moderate assistance, Stand-pivot, BSC/3in1, Rolling walker (2 wheels) Toileting- Clothing Manipulation and Hygiene: Moderate assistance, Sit to/from stand Functional mobility during ADLs: Moderate assistance, Rolling walker (2 wheels), Cueing for sequencing (ambulate short distance from bed to chair) General ADL Comments: sit<>stand x2 with focus on R knee extension, hip extension, full upright trunk and head up  ( posture). pt with good return demo with tactile input and one step commands  Cognition: Cognition Orientation Level: Oriented X4 Cognition Arousal: Alert Behavior During Therapy: WFL for tasks assessed/performed  Physical Exam: Blood pressure 113/76, pulse 93, temperature 98.1 F (36.7 C), temperature source Oral, resp. rate 19, height 5' 7 (1.702 m), weight 74.8 kg, SpO2 98%. Physical Exam Constitutional:      General: He is not in acute distress.    Appearance: He is not ill-appearing.  HENT:     Right Ear: External ear normal.     Left Ear: External ear normal.     Nose: Nose normal.     Mouth/Throat:     Mouth: Mucous  membranes are moist.  Eyes:     Conjunctiva/sclera: Conjunctivae normal.  Cardiovascular:     Rate and Rhythm: Normal rate and regular rhythm.     Heart sounds: No murmur heard.    No gallop.  Pulmonary:     Effort: Pulmonary effort is normal. No respiratory distress.  Abdominal:     General: Bowel sounds are normal. There is no distension.     Tenderness: There is no abdominal tenderness.  Musculoskeletal:        General: Swelling and tenderness (right hip) present. Normal range of motion.     Cervical back: Normal range of motion.  Skin:    Comments: Distal surgical incisions clean and intact with minimal drainage, foam dressing. Proximal incision with vac in place, serosanguinous drainage present in can. Bruising present  Neurological:     Mental Status: He is alert.  Comments: Alert and oriented x 3. Normal insight and awareness. Intact Memory. Normal language and speech. Cranial nerve exam unremarkable. MMT: BUE 5/5. RLE limited by pain proximally but 4+/5 ADF/PF. LLE 4/5 prox to 5/5 distally. Sensory exam normal for light touch and pain in all 4 limbs. No limb ataxia or cerebellar signs. No abnormal tone appreciated.  SABRA    Psychiatric:        Mood and Affect: Mood normal.        Behavior: Behavior normal.     Results for orders placed or performed during the hospital encounter of 09/27/23 (from the past 48 hours)  Glucose, capillary     Status: Abnormal   Collection Time: 10/02/23  1:05 PM  Result Value Ref Range   Glucose-Capillary 297 (H) 70 - 99 mg/dL    Comment: Glucose reference range applies only to samples taken after fasting for at least 8 hours.  Glucose, capillary     Status: Abnormal   Collection Time: 10/02/23  4:51 PM  Result Value Ref Range   Glucose-Capillary 236 (H) 70 - 99 mg/dL    Comment: Glucose reference range applies only to samples taken after fasting for at least 8 hours.  Glucose, capillary     Status: Abnormal   Collection Time: 10/02/23  9:00  PM  Result Value Ref Range   Glucose-Capillary 195 (H) 70 - 99 mg/dL    Comment: Glucose reference range applies only to samples taken after fasting for at least 8 hours.  CBC     Status: Abnormal   Collection Time: 10/03/23  4:29 AM  Result Value Ref Range   WBC 10.8 (H) 4.0 - 10.5 K/uL   RBC 2.84 (L) 4.22 - 5.81 MIL/uL   Hemoglobin 8.8 (L) 13.0 - 17.0 g/dL   HCT 73.7 (L) 60.9 - 47.9 %   MCV 92.3 80.0 - 100.0 fL   MCH 31.0 26.0 - 34.0 pg   MCHC 33.6 30.0 - 36.0 g/dL   RDW 87.5 88.4 - 84.4 %   Platelets 337 150 - 400 K/uL   nRBC 0.0 0.0 - 0.2 %    Comment: Performed at [facility], [address], [place], [place] [address]  Basic metabolic panel with GFR     Status: Abnormal   Collection Time: 10/03/23  4:29 AM  Result Value Ref Range   Sodium 132 (L) 135 - 145 mmol/L   Potassium 4.0 3.5 - 5.1 mmol/L   Chloride 100 98 - 111 mmol/L   CO2 24 22 - 32 mmol/L   Glucose, Bld 158 (H) 70 - 99 mg/dL    Comment: Glucose reference range applies only to samples taken after fasting for at least 8 hours.   BUN 29 (H) 8 - 23 mg/dL   Creatinine, Ser 8.97 0.61 - 1.24 mg/dL   Calcium  8.3 (L) 8.9 - 10.3 mg/dL   GFR, Estimated >39 >39 mL/min    Comment: (NOTE) Calculated using the CKD-EPI Creatinine Equation (2021)    Anion gap 8 5 - 15    Comment: Performed at [facility], [address] [place], [place] [address]  Glucose, capillary     Status: Abnormal   Collection Time: 10/03/23  6:08 AM  Result Value Ref Range   Glucose-Capillary 206 (H) 70 - 99 mg/dL    Comment: Glucose reference range applies only to samples taken after fasting for at least 8 hours.  Glucose, capillary     Status: Abnormal  Collection Time: 10/03/23  7:53 AM  Result Value Ref Range   Glucose-Capillary 146 (H) 70 - 99 mg/dL    Comment: Glucose reference range applies only to samples taken after fasting for at least 8 hours.  Glucose, capillary     Status: Abnormal   Collection Time: 10/03/23 11:53 AM   Result Value Ref Range   Glucose-Capillary 232 (H) 70 - 99 mg/dL    Comment: Glucose reference range applies only to samples taken after fasting for at least 8 hours.  Glucose, capillary     Status: Abnormal   Collection Time: 10/03/23  5:16 PM  Result Value Ref Range   Glucose-Capillary 194 (H) 70 - 99 mg/dL    Comment: Glucose reference range applies only to samples taken after fasting for at least 8 hours.  Glucose, capillary     Status: Abnormal   Collection Time: 10/03/23  9:23 PM  Result Value Ref Range   Glucose-Capillary 242 (H) 70 - 99 mg/dL    Comment: Glucose reference range applies only to samples taken after fasting for at least 8 hours.  CBC     Status: Abnormal   Collection Time: 10/04/23  4:08 AM  Result Value Ref Range   WBC 11.0 (H) 4.0 - 10.5 K/uL   RBC 2.71 (L) 4.22 - 5.81 MIL/uL   Hemoglobin 8.6 (L) 13.0 - 17.0 g/dL   HCT 74.6 (L) 60.9 - 47.9 %   MCV 93.4 80.0 - 100.0 fL   MCH 31.7 26.0 - 34.0 pg   MCHC 34.0 30.0 - 36.0 g/dL   RDW 87.2 88.4 - 84.4 %   Platelets 372 150 - 400 K/uL   nRBC 0.4 (H) 0.0 - 0.2 %    Comment: Performed at [facility], [address] [place], [place] [address]  Basic metabolic panel with GFR     Status: Abnormal   Collection Time: 10/04/23  4:08 AM  Result Value Ref Range   Sodium 137 135 - 145 mmol/L   Potassium 4.0 3.5 - 5.1 mmol/L   Chloride 102 98 - 111 mmol/L   CO2 24 22 - 32 mmol/L   Glucose, Bld 139 (H) 70 - 99 mg/dL    Comment: Glucose reference range applies only to samples taken after fasting for at least 8 hours.   BUN 25 (H) 8 - 23 mg/dL   Creatinine, Ser 8.98 0.61 - 1.24 mg/dL   Calcium  8.6 (L) 8.9 - 10.3 mg/dL   GFR, Estimated >39 >39 mL/min    Comment: (NOTE) Calculated using the CKD-EPI Creatinine Equation (2021)    Anion gap 11 5 - 15    Comment: Performed at [facility], [address], [place], [place] [address]  Glucose, capillary     Status: Abnormal   Collection Time: 10/04/23  6:09 AM   Result Value Ref Range   Glucose-Capillary 134 (H) 70 - 99 mg/dL    Comment: Glucose reference range applies only to samples taken after fasting for at least 8 hours.  Glucose, capillary     Status: Abnormal   Collection Time: 10/04/23  8:07 AM  Result Value Ref Range   Glucose-Capillary 155 (H) 70 - 99 mg/dL    Comment: Glucose reference range applies only to samples taken after fasting for at least 8 hours.   CT HIP RIGHT W CONTRAST Result Date: 10/02/2023 EXAM: CT OF THE RIGHT HIP WITH IV CONTRAST 10/02/2023 12:58:00 PM TECHNIQUE: CT of the right hip was  performed with the administration of intravenous contrast. Multiplanar reformatted images are provided for review. Automated exposure control, iterative reconstruction, and/or weight based adjustment of the mA/kV was utilized to reduce the radiation dose to as low as reasonably achievable. COMPARISON: Interoperative right hip spot images from 09/27/2023 and preoperative radiographs from 09/27/2023. CLINICAL HISTORY: Right hip fracture due to fall. FINDINGS: BONES: Right hip proximal femoral nail system in place traversing the comminuted intertrochanteric fracture with a single distal interlocking screw on the im nail component. Aside from the original intertrochanteric fracture, no new or specific complicating fracture is identified. Due to the comminution, there are anterior and posterior fragments of the greater trochanter which are not connected to the nail system. Comminuted lesser trochanteric fragments are displaced medially as on image 63 series 15 and likewise not connected to the nail system. No immediately adjacent pelvic fracture is observed. No aggressive appearing osseous abnormality or periostitis. SOFT TISSUE: Expected gas tracts along regional gluteal musculature. A 3.0 x 2.7 cm hematoma in the right gluteus medius on image 34 of series 12 and a 4.3 x 2.4 cm hematoma along the margin between the gluteus medius and minimus on image 36  of series 12 likely represent incidental postoperative blood products. Edema tracks along regional fascial planes including the right iliopsoas, right gluteal and proximal anterior thigh musculature, and right hip adductor musculature. Subcutaneous edema noted lateral to the right hip, not unexpected in this setting. There is some fullness of the right piriformis muscle but no sciatic notch impingement is observed. No soft tissue mass. No compelling findings of abscess at this time. JOINT: No significant degenerative changes. No osseous erosions. INTRAPELVIC CONTENTS: Limited images of the intrapelvic contents are unremarkable. Right external iliac and femoral atherosclerosis is present without overt occlusion. IMPRESSION: 1. Comminuted right intertrochanteric fracture postop day 5 status post proximal femoral nail fixation without new fracture. 2. Small hematomas in the right gluteus medius and between the gluteus medius and minimus. No findings of active bleeding. No abscess identified. 3. Right external iliac and femoral atherosclerosis without occlusion. 4. Expected edema tracking along regional muscular tissues and subcutaneous tissues. Electronically signed by: Ryan Salvage MD 10/02/2023 06:46 PM EDT RP Workstation: [id number]      Blood pressure 113/76, pulse 93, temperature 98.1 F (36.7 C), temperature source Oral, resp. rate 19, height 5' 7 (1.702 m), weight 74.8 kg, SpO2 98%.  Medical Problem List and Plan: 1. Functional deficits secondary to right intertrochanteric hip fx after fall at work. S/p right IM rod on 09/27/23  -patient may not yet shower  -ELOS/Goals: 12-14 days, supervision to mod I goals with PT and OT 2.  Antithrombotics: -DVT/anticoagulation:  Mechanical: Antiembolism stockings, thigh (TED hose) Bilateral lower extremities Sequential compression devices, below knee Bilateral lower extremities  -antiplatelet therapy: Holding Eliquis  3. Pain Management: Tylenol , Robaxin,  and Norco as needed 4. Mood/Behavior/Sleep: LCSW to follow for evaluation and support when available.   -antipsychotic agents: N/A  - Insomnia: Ambien  5 mg as needed 5. Neuropsych/cognition: This patient is capable of making decisions on his own behalf. 6. Skin/Wound Care: Maintain proximal incisional wound VAC per Ortho until drainage ceases. WOC Rn following   -foam dressing to lower incision  - Routine pressure-relief measures 7. Fluids/Electrolytes/Nutrition: Monitor intake and output and follow-up chemistries.  - Diet changed to carb modified-continue Glucerna  - Vitamin C and zinc 8. HLD: Zetia  10 mg 9. HTN: Coreg  12.5 mg holding Entresto   - Monitor blood pressures with increased activity 10. T2DM:  Resumed home Glipizide  --Inpatient insulin  needs have been lower than at home  -CBG ACHS with SSI and bedtime coverage. Lantus  20 units BID   - Resume Mounjaro ?  11. CKD IIIa: Baseline creatinine 1.1-1.4.  Stable at 1.1-- Faxiga  12. Chronic systolic CHF: Monitor daily weights and volume status. Holding Entresto , continue Coreg .   13. ABLA: Hemoglobin down from 11.7> 9.8> 8.8> 8.6.  Eliquis  held since 10/1  - Monitor H&H closely. Transfuse for hemoglobin less than 8 d/t cardiac hx   -check hgb 10/4 14. Post-op Hematomas: Maintain incisional wound vac dressing until staying dry. Ortho to advise when to remove. (See above) 15. Slow transit constipation  -60 cc sorbitol tonight followed by SSE if no result  -LBM 9/26. Pt has been eating  -begin daily miralax  and senokot-s tomorrow    Daphne LITTIE Satterfield, NP 10/04/2023

## 2023-10-04 NOTE — Progress Notes (Signed)
 Inpatient Rehab Admissions Coordinator:    I have insurance approval and a bed available for pt to admit to CIR today. Dr. Fausto in agreement and [facility] aware.  I've notified pt/family and they are in agreement to admit today.  I will make arrangements.    Reche Lowers, PT, DPT Admissions Coordinator [phone] 10/04/23  10:23 AM

## 2023-10-04 NOTE — Consult Note (Signed)
 WOC team consulted for management of wound vac.  Dr. Reyne orthopedics placed incisional wound vac 10/02/2023.  WOC team does not manage incisional wound vacs as these are placed by surgeons (usually in OR).  Per ortho note 10/04/2023 they will maintain the incisional vac until incision is dry indicating ortho will follow while in rehab  and remove vac when they feel appropriate. Incisional wound vac does not require regular changing schedule is usually just removed and discarded when surgeon feels appropriate.   Any issues/concerns with incisional vac will need to be directed to ortho.    WOC team will not follow.   Thank you,    Powell Bar MSN, RN-BC, [employer]

## 2023-10-04 NOTE — Progress Notes (Signed)
 Inpatient Rehabilitation Admission Medication Review by a Pharmacist  A complete drug regimen review was completed for this patient to identify any potential clinically significant medication issues.  High Risk Drug Classes Is patient taking? Indication by Medication  Antipsychotic Yes Compazine-N/V  Anticoagulant No Apixaban  on hold  Antibiotic No   Opioid Yes Norco-pain  Antiplatelet No   Hypoglycemics/insulin  Yes Novolog  SSI, Lantus , glipizide  XL, dapagliflozin  (CHF) --T2DM  Vasoactive Medication Yes Coreg , aldactone -CHF  Chemotherapy No   Other Yes Bisacodyl , Milk of magnesia, miralax , senna/docusate-constipation Ambien -sleep Vit C, MVI, zinc-supplementation Acetaminophen -mild pain Maalox-indigestion Benadryl-itching Guaifenesin/DM-cough Zetia -HLD Robaxin-muscle spasms      Type of Medication Issue Identified Description of Issue Recommendation(s)  Drug Interaction(s) (clinically significant)     Duplicate Therapy     Allergy     No Medication Administration End Date     Incorrect Dose     Additional Drug Therapy Needed     Significant med changes from prior encounter (inform family/care partners about these prior to discharge).    Other  1) Repatha  and Mounjaro  PTA 2) Entresto  (CHF) and Apixaban  (h/o CVA) on hold during acute admit 1) Continue on discharge 2) Please restart when able    Clinically significant medication issues were identified that warrant physician communication and completion of prescribed/recommended actions by midnight of the next day:  No  Name of provider notified for urgent issues identified:   Provider Method of Notification:     Pharmacist comments:   Time spent performing this drug regimen review (minutes):  10  Larraine Brazier, PharmD Clinical Pharmacist 10/04/2023  4:08 PM **Pharmacist phone directory can now be found on [email or web address] ([email or web address]).  Listed under [facility].

## 2023-10-04 NOTE — Progress Notes (Signed)
 Orthopaedic Progress Note  S: Patient is resting comfortably bed.  Ports his pain is decreased from yesterday.  O:  Vitals:   10/03/23 2006 10/04/23 0300  BP: 96/61 108/67  Pulse: 90 88  Resp: 15 14  Temp: 98.6 F (37 C) 98.5 F (36.9 C)  SpO2: 99% 98%    An incisional wound VAC is in place.  There is nones of any bloody drainage.  Minimal blood in the canister.  Thigh soft and compressible.  Neurovascular intact    Labs:  Results for orders placed or performed during the hospital encounter of 09/27/23 (from the past 24 hours)  Glucose, capillary     Status: Abnormal   Collection Time: 10/03/23  7:53 AM  Result Value Ref Range   Glucose-Capillary 146 (H) 70 - 99 mg/dL  Glucose, capillary     Status: Abnormal   Collection Time: 10/03/23 11:53 AM  Result Value Ref Range   Glucose-Capillary 232 (H) 70 - 99 mg/dL  Glucose, capillary     Status: Abnormal   Collection Time: 10/03/23  5:16 PM  Result Value Ref Range   Glucose-Capillary 194 (H) 70 - 99 mg/dL  Glucose, capillary     Status: Abnormal   Collection Time: 10/03/23  9:23 PM  Result Value Ref Range   Glucose-Capillary 242 (H) 70 - 99 mg/dL  CBC     Status: Abnormal   Collection Time: 10/04/23  4:08 AM  Result Value Ref Range   WBC 11.0 (H) 4.0 - 10.5 K/uL   RBC 2.71 (L) 4.22 - 5.81 MIL/uL   Hemoglobin 8.6 (L) 13.0 - 17.0 g/dL   HCT 74.6 (L) 60.9 - 47.9 %   MCV 93.4 80.0 - 100.0 fL   MCH 31.7 26.0 - 34.0 pg   MCHC 34.0 30.0 - 36.0 g/dL   RDW 87.2 88.4 - 84.4 %   Platelets 372 150 - 400 K/uL   nRBC 0.4 (H) 0.0 - 0.2 %  Basic metabolic panel with GFR     Status: Abnormal   Collection Time: 10/04/23  4:08 AM  Result Value Ref Range   Sodium 137 135 - 145 mmol/L   Potassium 4.0 3.5 - 5.1 mmol/L   Chloride 102 98 - 111 mmol/L   CO2 24 22 - 32 mmol/L   Glucose, Bld 139 (H) 70 - 99 mg/dL   BUN 25 (H) 8 - 23 mg/dL   Creatinine, Ser 8.98 0.61 - 1.24 mg/dL   Calcium  8.6 (L) 8.9 - 10.3 mg/dL   GFR, Estimated >39  >39 mL/min   Anion gap 11 5 - 15  Glucose, capillary     Status: Abnormal   Collection Time: 10/04/23  6:09 AM  Result Value Ref Range   Glucose-Capillary 134 (H) 70 - 99 mg/dL    Assessment: Status post IM rod right hip with postoperative wound drainage  The new incisional wound VAC seems to be holding much better.  There is evidence of bleeding underneath the bandage the catheter only has minimal bloody output.  Will continue to observe this.  He should continue with the incisional wound VAC until it stays dry.  His H&H also appears to be stable.  Is only very slightly decreased from yesterday going to 8.6 from 8.8.  At this point recommend continue with incisional wound VAC.  Continue with physical therapy.  From orthopedic standpoint I think it would be reasonable for him to transfer to rehab but defer to medicine for the final determination of  this.   Cordella Rhein, MD, MS [facility]ist [phone]

## 2023-10-05 DIAGNOSIS — I10 Essential (primary) hypertension: Secondary | ICD-10-CM

## 2023-10-05 DIAGNOSIS — R739 Hyperglycemia, unspecified: Secondary | ICD-10-CM

## 2023-10-05 LAB — GLUCOSE, CAPILLARY
Glucose-Capillary: 110 mg/dL — ABNORMAL HIGH (ref 70–99)
Glucose-Capillary: 122 mg/dL — ABNORMAL HIGH (ref 70–99)
Glucose-Capillary: 249 mg/dL — ABNORMAL HIGH (ref 70–99)

## 2023-10-05 MED ORDER — HYDROCORTISONE 1 % EX CREA: TOPICAL_CREAM | Freq: Two times a day (BID) | CUTANEOUS | Status: DC | PRN

## 2023-10-05 NOTE — Progress Notes (Signed)
 Occupational Therapy Session Note  Patient Details  Name: John [name] MRN: [id number] Date of Birth: 02/19/1960  Today's Date: 10/05/2023 OT Individual Time: 1117-1205 OT Individual Time Calculation (min): 48 min    Short Term Goals: Week 1:  OT Short Term Goal 1 (Week 1): Pt will complete STS at RW with Min VC for safety awareness OT Short Term Goal 2 (Week 1): Pt will complete LB dressing with set-up A OT Short Term Goal 3 (Week 1): Pt will complete toilet transfers with supervision A OT Short Term Goal 4 (Week 1): Pt will complete 3/3 toileting steps with supervsion  Skilled Therapeutic Interventions/Progress Updates:  Skilled OT session completed to address dynamic standing balance and safety with STS. Pt received supine in bed, agreeable to participate in therapy. Pt reports no pain.  Pt declined self-care needs. Pt completed supine>EOB Min A with HOB elevated and hand in hand steadying A. EOB>WC stand pivot transfer CGA with RW, VC to WB at RLE when standing. Pt dependently transferred to dayroom in [facility]. Pt completed placed 8/8 horse shoes on elevated surface in standing with CGA to reach standing and WB on RLE. Mirror used for visual feedback to correct standing posture d/t increased trunk flexion. Pt cued to WB to LLE in order to step on RLE during task. D/t fatigue pt required seated rest break during task. Pt completes 2nd trial with Min A to reach standing with RW and manage wound vac. 3x STS completed with Min-Mod A to reach standing d/t fatigue. VC required for RW safety, pt attempts to reach standing by pushing up with RW. Pt returned to room, WC>EOB Min A to reach standing, EOB>supine Min A to lift RLE in bed. Pt supine in bed with bed alarm on and all needs met.   Therapy Documentation Precautions:  Restrictions Weight Bearing Restrictions Per Provider Order: Yes RLE Weight Bearing Per Provider Order: Weight bearing as tolerated   Therapy/Group: Individual  Therapy  Lillybeth Tal Woods-Chance, MS, OTR/L 10/05/2023, 7:57 AM

## 2023-10-05 NOTE — Progress Notes (Signed)
 PROGRESS NOTE   Subjective/Complaints:  Pt doing well, slept ok, pain manageable, LBM yesterday, urinating fine. No other complaints or concerns.   ROS: as per HPI. Denies CP, SOB, abd pain, N/V/D/C, or any other complaints at this time.    Objective:   No results found. Recent Labs    10/03/23 0429 10/04/23 0408  WBC 10.8* 11.0*  HGB 8.8* 8.6*  HCT 26.2* 25.3*  PLT 337 372   Recent Labs    10/03/23 0429 10/04/23 0408  NA 132* 137  K 4.0 4.0  CL 100 102  CO2 24 24  GLUCOSE 158* 139*  BUN 29* 25*  CREATININE 1.02 1.01  CALCIUM  8.3* 8.6*      Intake/Output Summary (Last 24 hours) at 10/05/2023 1145 Last data filed at 10/05/2023 0505 Gross per 24 hour  Intake --  Output 550 ml  Net -550 ml        Physical Exam: Vital Signs Blood pressure 110/66, pulse 88, temperature 98.6 F (37 C), temperature source Oral, resp. rate 16, height 5' 7 (1.702 m), weight 80.1 kg, SpO2 97%.  Constitutional: No distress . Vital signs reviewed. Upright in bed.  HEENT: NCAT, EOMI, oral membranes moist, a little HOH Neck: supple Cardiovascular: RRR without m/r/g appreciated. No JVD    Respiratory/Chest: CTA Bilaterally without wheezes or rales. Normal effort    GI/Abdomen: soft, +BS throughout, non-tender, non-distended Ext: no clubbing, cyanosis, or edema, R hip incision covered.  Psych: pleasant and cooperative Skin: dry, warm   PRIOR EXAMS: Musculoskeletal:        General: Swelling and tenderness (right hip) present. Normal range of motion.     Cervical back: Normal range of motion.  Skin:    Comments: Distal surgical incisions clean and intact with minimal drainage, foam dressing. Proximal incision with vac in place, serosanguinous drainage present in can. Bruising present  Neurological:     Mental Status: He is alert.     Comments: Alert and oriented x 3. Normal insight and awareness. Intact Memory. Normal  language and speech. Cranial nerve exam unremarkable. MMT: BUE 5/5. RLE limited by pain proximally but 4+/5 ADF/PF. LLE 4/5 prox to 5/5 distally. Sensory exam normal for light touch and pain in all 4 limbs. No limb ataxia or cerebellar signs. No abnormal tone appreciated..    Assessment/Plan: 1. Functional deficits which require 3+ hours per day of interdisciplinary therapy in a comprehensive inpatient rehab setting. Physiatrist is providing close team supervision and 24 hour management of active medical problems listed below. Physiatrist and rehab team continue to assess barriers to discharge/monitor patient progress toward functional and medical goals  Care Tool:  Bathing              Bathing assist       Upper Body Dressing/Undressing Upper body dressing        Upper body assist      Lower Body Dressing/Undressing Lower body dressing            Lower body assist       Toileting Toileting    Toileting assist       Transfers Chair/bed transfer  Transfers assist  Locomotion Ambulation   Ambulation assist              Walk 10 feet activity   Assist           Walk 50 feet activity   Assist           Walk 150 feet activity   Assist           Walk 10 feet on uneven surface  activity   Assist           Wheelchair     Assist               Wheelchair 50 feet with 2 turns activity    Assist            Wheelchair 150 feet activity     Assist          Blood pressure 110/66, pulse 88, temperature 98.6 F (37 C), temperature source Oral, resp. rate 16, height 5' 7 (1.702 m), weight 80.1 kg, SpO2 97%.  Medical Problem List and Plan: 1. Functional deficits secondary to right intertrochanteric hip fx after fall at work. S/p right IM rod on 09/27/23             -patient may not yet shower             -ELOS/Goals: 12-14 days, supervision to mod I goals with PT and OT  -Continue CIR 2.   Antithrombotics: -DVT/anticoagulation:  Mechanical: Antiembolism stockings, thigh (TED hose) Bilateral lower extremities Sequential compression devices, below knee Bilateral lower extremities             -antiplatelet therapy: Holding Eliquis  3. Pain Management: Tylenol , Robaxin, and Norco as needed 4. Mood/Behavior/Sleep: LCSW to follow for evaluation and support when available.              -antipsychotic agents: N/A             - Insomnia: Ambien  5 mg as needed 5. Neuropsych/cognition: This patient is capable of making decisions on his own behalf. 6. Skin/Wound Care: Maintain proximal incisional wound VAC per Ortho until drainage ceases. WOC Rn signed off             -foam dressing to lower incision             - Routine pressure-relief measures 7. Fluids/Electrolytes/Nutrition: Monitor intake and output and follow-up chemistries.             - Diet changed to carb modified-continue Glucerna             - Vitamin C and zinc 8. HLD: Zetia  10 mg 9. HTN: Coreg  12.5 mg BID, Spironolactone  12.5mg  daily, holding Entresto              - Monitor blood pressures with increased activity  -10/05/23 BP soft but stable, monitor closely.  Vitals:   10/04/23 1557 10/04/23 1847 10/04/23 1934 10/05/23 0438  BP: 128/88 106/78 (!) 89/59 106/68   10/05/23 0915  BP: 110/66    10. T2DM: Resumed home Glipizide  10mg  daily, Farxiga  10mg  daily --Inpatient insulin  needs have been lower than at home  -CBG ACHS with SSI and bedtime coverage. Lantus  20 units BID (ordered daily currently)             - Resume Mounjaro ?   -10/05/23 CBGs pretty good here, monitor CBG (last 3)  Recent Labs    10/04/23 2036 10/05/23 0504 10/05/23 1106  GLUCAP 199* 110* 122*  11. CKD IIIa: Baseline creatinine 1.1-1.4.  Stable at 1.1-- Farxiga   12. Chronic systolic CHF: Monitor daily weights and volume status. Holding Entresto , continue Coreg  and aldactone .    -10/05/23 wt stable, monitor Filed Weights   10/04/23 1557 10/04/23  1601 10/05/23 0500  Weight: 79.4 kg 79.4 kg 80.1 kg    13. ABLA: Hemoglobin down from 11.7> 9.8> 8.8> 8.6.  Eliquis  held since 10/1             - Monitor H&H closely. Transfuse for hemoglobin less than 8 d/t cardiac hx              -check hgb 10/6 14. Post-op Hematomas: Maintain incisional wound vac dressing until staying dry. Ortho to advise when to remove. (See above) 15. Slow transit constipation             -60 cc sorbitol tonight followed by SSE if no result             -LBM 9/26. Pt has been eating             -begin daily miralax  BID and senokot-s 2 tabs BID tomorrow  -10/05/23 BM yesterday, monitor with current regimen.     LOS: 1 days A FACE TO FACE EVALUATION WAS PERFORMED  [address] 10/05/2023, 11:45 AM

## 2023-10-05 NOTE — Evaluation (Signed)
 Physical Therapy Assessment and Plan  Patient Details  Name: John [name] MRN: [id number] Date of Birth: Oct 10, 1960  PT Diagnosis: Abnormal posture, Abnormality of gait, Difficulty walking, Edema, Muscle weakness, and Pain in R hip Rehab Potential: Good ELOS: 10-12 days   Today's Date: 10/05/2023 PT Individual Time: 1245-1410 PT Individual Time Calculation (min): 85 min    Hospital Problem: Principal Problem:   Debility   Past Medical History:  Past Medical History:  Diagnosis Date   AICD (automatic cardioverter/defibrillator) present    Arthritis    Diabetes mellitus without complication (HCC)    Hyperlipidemia    Hypertension    Stroke (HCC) 05/2020   Past Surgical History:  Past Surgical History:  Procedure Laterality Date   ICD IMPLANT N/A 06/02/2021   Procedure: ICD IMPLANT;  Surgeon: Fernande Elspeth BROCKS, MD;  Location: [facility] INVASIVE CV LAB;  Service: Cardiovascular;  Laterality: N/A;   INTRAMEDULLARY (IM) NAIL INTERTROCHANTERIC Right 09/27/2023   Procedure: FIXATION, FRACTURE, INTERTROCHANTERIC, WITH INTRAMEDULLARY ROD;  Surgeon: Reyne Cordella SQUIBB, MD;  Location: [facility] OR;  Service: Orthopedics;  Laterality: Right;   LEFT HEART CATH AND CORONARY ANGIOGRAPHY N/A 08/18/2020   Procedure: LEFT HEART CATH AND CORONARY ANGIOGRAPHY;  Surgeon: [place]az[place], Peter M, MD;  Location: [facility] INVASIVE CV LAB;  Service: Cardiovascular;  Laterality: N/A;   TONSILLECTOMY      Assessment & Plan Clinical Impression: Patient is a 63 y.o. year old male with PMHx chronic systolic CHF, ICD implant, hypertension, hyperlipidemia, DM type II, CAD, CVA on Eliquis .  The patient presented to [facility] on 09/27/2023 with complaints of right hip pain after suffering a ground level fall at work.  CT head was negative for acute abnormalities and hip x-ray revealed right trochanteric hip fracture.  Orthopedics was consulted and the patient underwent Entercote trochanteric fixation with IM rod by Dr.Gebauer on  the same day. Post op CT of right hip on 10/1 reveled small, hematomas seen in the gluteus medius and minus. Hospital course complicated by ABLA, Eliquis  has since been held. Hemoglobin stabilized hemoglobin 8.6 from yesterday 8.8. Wound VAC place to the right hip and to remain under the direction of orthopedics who continues to follow closely.    Prior to arrival independent with ADLs and active in the community driving/working.  Currently he requires mod assist to toilet and for transfers, mod assistance for functional mobility with rolling walker cueing for sequencing and is able to ambulate short distance from bed to chair. Therapy evaluations completed due to patient decreased functional mobility was admitted for a comprehensive rehab program.   Patient currently requires mod with mobility secondary to muscle weakness and muscle joint tightness, decreased cardiorespiratoy endurance, and decreased standing balance, decreased postural control, and decreased balance strategies.  Prior to hospitalization, patient was independent  with mobility and lived with Spouse in a House home.  Home access is 3 - pt has no stairs around the back of his house and plans on using this entranceStairs to enter, Level entry.  Patient will benefit from skilled PT intervention to maximize safe functional mobility, minimize fall risk, and decrease caregiver burden for planned discharge home with intermittent assist.  Anticipate patient will benefit from follow up [facility] at discharge.  PT - End of Session Activity Tolerance: Tolerates 30+ min activity with multiple rests Endurance Deficit: Yes Endurance Deficit Description: required frequent rest breaks PT Assessment Rehab Potential (ACUTE/IP ONLY): Good PT Barriers to Discharge: Home environment access/layout;Decreased caregiver support;Wound Care;Other (comments) PT Barriers  to Discharge Comments: pain, wound vac, wife at work during the day, RLE weakness PT Patient  demonstrates impairments in the following area(s): Balance;Edema;Endurance;Motor;Nutrition;Pain;Skin Integrity PT Transfers Functional Problem(s): Bed Mobility;Bed to Chair;Car;Furniture PT Locomotion Functional Problem(s): Ambulation;Wheelchair Mobility;Stairs PT Plan PT Intensity: Minimum of 1-2 x/day ,45 to 90 minutes PT Frequency: 5 out of 7 days PT Duration Estimated Length of Stay: 10-12 days PT Treatment/Interventions: Ambulation/gait training;Discharge planning;Functional mobility training;Psychosocial support;Therapeutic Activities;Balance/vestibular training;Disease management/prevention;Neuromuscular re-education;Skin care/wound management;Therapeutic Exercise;Wheelchair propulsion/positioning;DME/adaptive equipment instruction;Pain management;Splinting/orthotics;UE/LE Strength taining/ROM;Community reintegration;Patient/family education;Stair training;UE/LE Coordination activities PT Transfers Anticipated Outcome(s): Mod I with LRAD PT Locomotion Anticipated Outcome(s): Mod I with LRAD PT Recommendation Follow Up Recommendations: Home health PT Patient destination: Home Equipment Recommended: To be determined   PT Evaluation Precautions/Restrictions Precautions Precautions: Fall;Other (comment) Precaution/Restrictions Comments: wound vac Restrictions Weight Bearing Restrictions Per Provider Order: No RLE Weight Bearing Per Provider Order: Weight bearing as tolerated Other Position/Activity Restrictions: Unrestricted RLE ROM Pain Interference Pain Interference Pain Effect on Sleep: 1. Rarely or not at all Pain Interference with Therapy Activities: 1. Rarely or not at all Pain Interference with Day-to-Day Activities: 1. Rarely or not at all Home Living/Prior Functioning Home Living Available Help at Discharge: Family;Available PRN/intermittently (daughter and son can assist) Type of Home: House Home Access: Stairs to enter;Level entry Entrance Stairs-Number of Steps: 3 -  pt has no stairs around the back of his house and plans on using this entrance Entrance Stairs-Rails: None Home Layout: One level Bathroom Shower/Tub: Walk-in shower;Tub/shower Psychologist, forensic Accessibility: No Additional Comments: bedside commode, RW, SPC, and quad cane  Lives With: Spouse Prior Function Level of Independence: Requires assistive device for independence  Able to Take Stairs?: Yes Driving: Yes Vocation: Part time employment Vocation Requirements: stocking at [employer] Vision/Perception  Vision - History Ability to See in Adequate Light: 0 Adequate Perception Perception: Within Functional Limits Praxis Praxis: WFL  Cognition Overall Cognitive Status: Within Functional Limits for tasks assessed Arousal/Alertness: Awake/alert Orientation Level: Oriented X4 Memory: Appears intact Awareness: Appears intact Problem Solving: Impaired Safety/Judgment: Appears intact Sensation Sensation Light Touch: Appears Intact Hot/Cold: Not tested Proprioception: Appears Intact Stereognosis: Not tested Coordination Gross Motor Movements are Fluid and Coordinated: Yes Fine Motor Movements are Fluid and Coordinated: No Heel Shin Test: unable to perform on RLE Motor  Motor Motor: Within Functional Limits Motor - Skilled Clinical Observations: limited by pain and RLE weakness  Trunk/Postural Assessment  Cervical Assessment Cervical Assessment: Within Functional Limits Thoracic Assessment Thoracic Assessment: Within Functional Limits Lumbar Assessment Lumbar Assessment: Exceptions to [facility] (posterior pelvic tilt) Postural Control Postural Control: Within Functional Limits  Balance Balance Balance Assessed: Yes Static Sitting Balance Static Sitting - Balance Support: Feet supported;Bilateral upper extremity supported Static Sitting - Level of Assistance: 6: Modified independent (Device/Increase time) Dynamic Sitting Balance Dynamic Sitting -  Balance Support: Feet supported;During functional activity;No upper extremity supported Dynamic Sitting - Level of Assistance: 5: Stand by assistance (supervision) Dynamic Sitting - Balance Activities: Forward lean/weight shifting;Lateral lean/weight shifting;Reaching for objects Static Standing Balance Static Standing - Balance Support: Bilateral upper extremity supported;During functional activity (RW) Static Standing - Level of Assistance: 5: Stand by assistance (CGA) Dynamic Standing Balance Dynamic Standing - Balance Support: Bilateral upper extremity supported;During functional activity (RW) Dynamic Standing - Level of Assistance: 4: Min assist Dynamic Standing - Comments: with transfers and ambulation Extremity Assessment  RLE Assessment RLE Assessment: Exceptions to [facility] General Strength Comments: tested sitting in WC - limited by pain RLE Strength Right Hip  Flexion: 2-/5 Right Hip ABduction: 3-/5 Right Hip ADduction: 3-/5 Right Knee Flexion: 3-/5 Right Knee Extension: 3-/5 Right Ankle Dorsiflexion: 3+/5 Right Ankle Plantar Flexion: 3+/5 LLE Assessment LLE Assessment: Exceptions to [facility] General Strength Comments: tested sitting in WC LLE Strength Left Hip Flexion: 3+/5 Left Hip ABduction: 4-/5 Left Hip ADduction: 4-/5 Left Knee Flexion: 4-/5 Left Knee Extension: 4-/5 Left Ankle Dorsiflexion: 4/5 Left Ankle Plantar Flexion: 4/5  Care Tool Care Tool Bed Mobility Roll left and right activity   Roll left and right assist level: Minimal Assistance - Patient > 75%    Sit to lying activity   Sit to lying assist level: Minimal Assistance - Patient > 75%    Lying to sitting on side of bed activity   Lying to sitting on side of bed assist level: the ability to move from lying on the back to sitting on the side of the bed with no back support.: Moderate Assistance - Patient 50 - 74%     Care Tool Transfers Sit to stand transfer   Sit to stand assist level: Moderate  Assistance - Patient 50 - 74%    Chair/bed transfer   Chair/bed transfer assist level: Minimal Assistance - Patient > 75%    Car transfer   Car transfer assist level: Moderate Assistance - Patient 50 - 74%      Care Tool Locomotion Ambulation   Assist level: Minimal Assistance - Patient > 75% Assistive device: Walker-rolling Max distance: 20ft  Walk 10 feet activity   Assist level: Minimal Assistance - Patient > 75% Assistive device: Walker-rolling   Walk 50 feet with 2 turns activity Walk 50 feet with 2 turns activity did not occur: Safety/medical concerns (pain, weakness)      Walk 150 feet activity Walk 150 feet activity did not occur: Safety/medical concerns (pain, weakness)      Walk 10 feet on uneven surfaces activity Walk 10 feet on uneven surfaces activity did not occur: Safety/medical concerns (pain, weakness)      Stairs Stair activity did not occur: Safety/medical concerns (pain, weakness)        Walk up/down 1 step activity Walk up/down 1 step or curb (drop down) activity did not occur: Safety/medical concerns (pain, weakness)      Walk up/down 4 steps activity Walk up/down 4 steps activity did not occur: Safety/medical concerns (pain, weakness)      Walk up/down 12 steps activity Walk up/down 12 steps activity did not occur: Safety/medical concerns (pain, weakness)      Pick up small objects from floor Pick up small object from the floor (from standing position) activity did not occur: Safety/medical concerns (pain, weakness)      Wheelchair Is the patient using a wheelchair?: Yes Type of Wheelchair: Manual   Wheelchair assist level: Supervision/Verbal cueing Max wheelchair distance: 117ft  Wheel 50 feet with 2 turns activity   Assist Level: Supervision/Verbal cueing  Wheel 150 feet activity   Assist Level: Supervision/Verbal cueing    Refer to Care Plan for Long Term Goals  SHORT TERM GOAL WEEK 1 PT Short Term Goal 1 (Week 1): pt will perform bed  mobility with min A consistantly PT Short Term Goal 2 (Week 1): pt will transfer sit<>stand with LRAD and CGA PT Short Term Goal 3 (Week 1): pt will ambulate 30ft with LRAD and CGA  Recommendations for other services: None   Skilled Therapeutic Intervention Evaluation completed (see details above and below) with education on PT POC and goals and  individual treatment initiated with focus on functional mobility/transfers, generalized strengthening and endurance, dynamic standing balance/coordination, simulated car transfers, and ambulation. Received pt semi-reclined in bed, pt educated on PT evaluation, CIR policies, and therapy schedule and agreeable. Pt reported pain 9/10 in R hip - RN notified and present to adminster medication.   Pt transferred semi-reclined<>sitting R EOB with HOB elevated and use of bedrails with mod A for trunk control and RLE management. Stood from EOB with RW and mod A and ambulated 22ft with RW and min A to WC. Pt with difficulty lifting R foot and required min A to advance R foot. Pt performed WC mobility 125ft using BUE and supervision with emphasis on UE strength/coordination. Transported remainder of way to ortho gym and performed simulated car transfer with RW and mod A overall to stand. Pt able to get RLE in/out of car with heavy reliance on BUE support and +++ time. Pt then ambulated additional 109ft with RW and min A with wife providing WC follow - limited by pain.    Pt performed seated BLE strengthening on Kinetron at 20 cm/sec for 5 minutes with emphasis on glute/quad strength and R hip ROM. Pt requesting to go outside with wife - asked PA for grounds pass while RN administered medications. Pt then performed the following exercises with emphasis on LE strength/ROM: -active assisted R knee flexion 2x12 -R knee extension 2x8 -hip adduction ball squeezes 2x10 -heel raises 2x10  Provided pt with ice pack for R hip for pain relief and pt left seated in [facility] with wife  planning to take pt outside.    Mobility Bed Mobility Bed Mobility: Rolling Left;Supine to Sit Rolling Left: Minimal Assistance - Patient > 75% Supine to Sit: Moderate Assistance - Patient 50-74% Transfers Transfers: Sit to Stand;Stand Pivot Transfers Sit to Stand: Moderate Assistance - Patient 50-74% Stand to Sit: Minimal Assistance - Patient > 75% Stand Pivot Transfers: Minimal Assistance - Patient > 75% Transfer (Assistive device): Rolling walker Locomotion  Gait Ambulation: Yes Gait Assistance: Minimal Assistance - Patient > 75% Gait Distance (Feet): 15 Feet Assistive device: Rolling walker Gait Gait: Yes Gait Pattern: Impaired Gait Pattern: Step-to pattern;Decreased step length - right;Decreased step length - left;Decreased stance time - right;Decreased stride length;Decreased hip/knee flexion - right;Trunk flexed;Poor foot clearance - left;Poor foot clearance - right;Narrow base of support Gait velocity: decreased Stairs / Additional Locomotion Stairs: No Corporate treasurer: Yes Wheelchair Assistance: Doctor, general practice: Both upper extremities Wheelchair Parts Management: Needs assistance Distance: 121ft   Discharge Criteria: Patient will be discharged from PT if patient refuses treatment 3 consecutive times without medical reason, if treatment goals not met, if there is a change in medical status, if patient makes no progress towards goals or if patient is discharged from hospital.  The above assessment, treatment plan, treatment alternatives and goals were discussed and mutually agreed upon: by patient  Calianna Kim M Zaunegger Kamrin Spath Zaunegger PT, DPT 10/05/2023, 2:19 PM

## 2023-10-05 NOTE — Evaluation (Addendum)
 Occupational Therapy Assessment and Plan  Patient Details  Name: John [name] MRN: [id number] Date of Birth: 05/09/1960  OT Diagnosis: acute pain and muscle weakness (generalized) Rehab Potential: Rehab Potential (ACUTE ONLY): Fair ELOS: 10 days   Today's Date: 10/05/2023 OT Individual Time: 9199-9085 OT Individual Time Calculation (min): 74 min     Hospital Problem: Principal Problem:   Debility   Past Medical History:  Past Medical History:  Diagnosis Date   AICD (automatic cardioverter/defibrillator) present    Arthritis    Diabetes mellitus without complication (HCC)    Hyperlipidemia    Hypertension    Stroke (HCC) 05/2020   Past Surgical History:  Past Surgical History:  Procedure Laterality Date   ICD IMPLANT N/A 06/02/2021   Procedure: ICD IMPLANT;  Surgeon: Fernande Elspeth BROCKS, MD;  Location: [facility] INVASIVE CV LAB;  Service: Cardiovascular;  Laterality: N/A;   INTRAMEDULLARY (IM) NAIL INTERTROCHANTERIC Right 09/27/2023   Procedure: FIXATION, FRACTURE, INTERTROCHANTERIC, WITH INTRAMEDULLARY ROD;  Surgeon: Reyne Cordella SQUIBB, MD;  Location: [facility] OR;  Service: Orthopedics;  Laterality: Right;   LEFT HEART CATH AND CORONARY ANGIOGRAPHY N/A 08/18/2020   Procedure: LEFT HEART CATH AND CORONARY ANGIOGRAPHY;  Surgeon: [place], Peter M, MD;  Location: [facility] INVASIVE CV LAB;  Service: Cardiovascular;  Laterality: N/A;   TONSILLECTOMY      Assessment & Plan Clinical Impression: John [name] is a 63 year old male with PMHx chronic systolic CHF, ICD implant, hypertension, hyperlipidemia, DM type [name], CAD, CVA on Eliquis .  The patient presented to [facility] on 09/27/2023 with complaints of right hip pain after suffering a ground level fall at work.  CT head was negative for acute abnormalities and hip x-ray revealed right trochanteric hip fracture.  Orthopedics was consulted and the patient underwent Entercote trochanteric fixation with IM rod by Dr.Gebauer on the same day. Post  op CT of right hip on 10/1 reveled small, hematomas seen in the gluteus medius and minus. Hospital course complicated by ABLA, Eliquis  has since been held. Hemoglobin stabilized hemoglobin 8.6 from yesterday 8.8. Wound VAC place to the right hip and to remain under the direction of orthopedics who continues to follow closely.    Prior to arrival independent with ADLs and active in the community driving/working.  Currently he requires mod assist to toilet and for transfers, mod assistance for functional mobility with rolling walker cueing for sequencing and is able to ambulate short distance from bed to chair. Therapy evaluations completed due to patient decreased functional mobility was admitted for a comprehensive rehab program. Patient transferred to CIR on 10/04/2023 .    Patient currently requires min with basic self-care skills and IADL secondary to muscle weakness, decreased cardiorespiratoy endurance, and decreased standing balance and decreased postural control.  Prior to hospitalization, patient could complete UB/LB dressing and bathing, working, driving, med mgmt with independent .  Patient will benefit from skilled intervention to decrease level of assist with basic self-care skills prior to discharge home with care partner.  Anticipate patient will require intermittent supervision and follow up home health.  OT - End of Session Activity Tolerance: Tolerates 10 - 20 min activity with multiple rests Endurance Deficit: Yes OT Assessment Rehab Potential (ACUTE ONLY): Fair OT Barriers to Discharge: Wound Care OT Patient demonstrates impairments in the following area(s): Balance;Endurance;Motor;Pain;Safety;Perception;Skin Integrity OT Basic ADL's Functional Problem(s): Grooming;Bathing;Dressing;Toileting OT Transfers Functional Problem(s): Toilet;Tub/Shower OT Additional Impairment(s): None OT Plan OT Intensity: Minimum of 1-2 x/day, 45 to 90 minutes OT  Frequency: 5 out of 7 days OT  Duration/Estimated Length of Stay: 10 days OT Treatment/Interventions: Balance/vestibular training;Functional electrical stimulation;Pain management;Self Care/advanced ADL retraining;Therapeutic Activities;UE/LE Coordination activities;Disease mangement/prevention;Functional mobility training;Patient/family education;Skin care/wound managment;Therapeutic Exercise;DME/adaptive equipment instruction;Community reintegration;Psychosocial support;Neuromuscular re-education;UE/LE Strength taining/ROM;Wheelchair propulsion/positioning OT Self Feeding Anticipated Outcome(s): Mod-i OT Basic Self-Care Anticipated Outcome(s): Mod-I OT Toileting Anticipated Outcome(s): Mod-I OT Bathroom Transfers Anticipated Outcome(s): Mod-I OT Recommendation Recommendations for Other Services: Therapeutic Recreation consult Therapeutic Recreation Interventions: Pet therapy;Stress management Patient destination: Home Follow Up Recommendations: Home health OT Equipment Recommended: 3 in 1 bedside comode   OT Evaluation Precautions/Restrictions  Precautions Precautions: Fall;Other (comment) Recall of Precautions/Restrictions: Intact Precaution/Restrictions Comments: wound vac Restrictions Weight Bearing Restrictions Per Provider Order: Yes RLE Weight Bearing Per Provider Order: Weight bearing as tolerated Vital Signs Therapy Vitals Pulse Rate: 88 BP: 110/66 Pain Pain Assessment Pain Scale: 0-10 Pain Score: 7  Pain Type: Surgical pain Pain Location: Hip Pain Orientation: Right Pain Intervention(s): Medication (See eMAR) Home Living/Prior Functioning Home Living Family/patient expects to be discharged to:: Private residence Living Arrangements: Spouse/significant other Available Help at Discharge: Family, Available PRN/intermittently Type of Home: House Home Access: Stairs to enter, Level entry Secretary/administrator of Steps: 3 Entrance Stairs-Rails: None Home Layout: One level Bathroom Shower/Tub:  Psychologist, counselling, Engineer, manufacturing systems: Standard Bathroom Accessibility: No Additional Comments: BSC, RW  Lives With: Spouse IADL History Homemaking Responsibilities: Yes Meal Prep Responsibility: Secondary Laundry Responsibility: Secondary Cleaning Responsibility: Secondary Bill Paying/Finance Responsibility: Secondary Shopping Responsibility: Secondary Child Care Responsibility: Secondary Current License: Yes Mode of Transportation: Car Occupation: Part time employment Type of Occupation: Designer, 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 Leisure and Hobbies: Plays with dogs Prior Function Level of Independence: Independent with basic ADLs, Independent with homemaking with ambulation, Independent with transfers, Independent with gait  Able to Take Stairs?: Yes Driving: Yes Vocation: Part time employment Vision Baseline Vision/History: 0 No visual deficits Ability to See in Adequate Light: 0 Adequate Patient Visual Report: No change from baseline Vision Assessment?: No apparent visual deficits Perception  Perception: Within Functional Limits Praxis Praxis: WFL Cognition Brief Interview for Mental Status (BIMS) Repetition of Three Words (First Attempt): 3 Temporal Orientation: Year: Correct Temporal Orientation: Month: Accurate within 5 days Temporal Orientation: Day: Incorrect Recall: Sock: Yes, no cue required Recall: Blue: Yes, no cue required Recall: Bed: Yes, no cue required BIMS Summary Score: 14 Sensation Sensation Light Touch: Appears Intact Hot/Cold: Not tested Proprioception: Appears Intact Stereognosis: Not tested Coordination Gross Motor Movements are Fluid and Coordinated: Yes Fine Motor Movements are Fluid and Coordinated: No Motor  Motor Motor: Within Functional Limits  Trunk/Postural Assessment  Cervical Assessment Cervical Assessment: Within Functional Limits Thoracic Assessment Thoracic Assessment: Within Functional Limits Lumbar Assessment Lumbar  Assessment: Exceptions to [facility] (posterior pelvic tilt)  Balance Balance Balance Assessed: Yes Static Sitting Balance Static Sitting - Level of Assistance: 6: Modified independent (Device/Increase time) Dynamic Sitting Balance Dynamic Sitting - Balance Support: Feet supported;During functional activity Dynamic Sitting - Level of Assistance: 5: Stand by assistance Dynamic Sitting - Balance Activities: Forward lean/weight shifting;Lateral lean/weight shifting;Reaching for Higher education careers adviser Standing - Balance Support: Bilateral upper extremity supported Static Standing - Level of Assistance: 5: Stand by assistance Dynamic Standing Balance Dynamic Standing - Balance Support: Bilateral upper extremity supported Dynamic Standing - Level of Assistance: 4: Min assist Dynamic Standing - Comments: WB 50% in RLE d/t pain Extremity/Trunk Assessment RUE Assessment RUE Assessment: Within Functional Limits General Strength Comments: 5/5 LUE Assessment LUE Assessment: Within Functional Limits General  Strength Comments: 5/5  Care Tool Care Tool Self Care Eating   Eating Assist Level: Independent    Oral Care    Oral Care Assist Level: Independent    Bathing   Body parts bathed by patient: Right arm;Left arm;Chest;Abdomen;Front perineal area;Buttocks;Right upper leg;Left upper leg;Right lower leg;Left lower leg;Face     Assist Level: Contact Guard/Touching assist    Upper Body Dressing(including orthotics)   What is the patient wearing?: Pull over shirt   Assist Level: Set up assist    Lower Body Dressing (excluding footwear)   What is the patient wearing?: Underwear/pull up Assist for lower body dressing: Contact Guard/Touching assist    Putting on/Taking off footwear   What is the patient wearing?: Socks Assist for footwear: Contact Guard/Touching assist       Care Tool Toileting Toileting activity   Assist for toileting: Contact Guard/Touching assist      Care Tool Bed Mobility Roll left and right activity   Roll left and right assist level: Minimal Assistance - Patient > 75%    Sit to lying activity   Sit to lying assist level: Minimal Assistance - Patient > 75%    Lying to sitting on side of bed activity   Lying to sitting on side of bed assist level: the ability to move from lying on the back to sitting on the side of the bed with no back support.: Minimal Assistance - Patient > 75%     Care Tool Transfers Sit to stand transfer   Sit to stand assist level: Minimal Assistance - Patient > 75%    Chair/bed transfer   Chair/bed transfer assist level: Minimal Assistance - Patient > 75%     Toilet transfer   Assist Level: Minimal Assistance - Patient > 75%     Care Tool Cognition  Expression of Ideas and Wants Expression of Ideas and Wants: 4. Without difficulty (complex and basic) - expresses complex messages without difficulty and with speech that is clear and easy to understand  Understanding Verbal and Non-Verbal Content Understanding Verbal and Non-Verbal Content: 4. Understands (complex and basic) - clear comprehension without cues or repetitions   Memory/Recall Ability Memory/Recall Ability : Current season;Location of own room;Staff names and faces;That he or she is in a hospital/hospital unit   Refer to Care Plan for Long Term Goals  SHORT TERM GOAL WEEK 1 OT Short Term Goal 1 (Week 1): Pt will complete STS at RW with Min VC for safety awareness OT Short Term Goal 2 (Week 1): Pt will complete LB dressing with set-up A OT Short Term Goal 3 (Week 1): Pt will complete toilet transfers with supervision A OT Short Term Goal 4 (Week 1): Pt will complete 3/3 toileting steps with supervsion  Recommendations for other services: Therapeutic Recreation  Pet therapy and Stress management   Skilled Therapeutic Intervention 1:1 evaluation and treatment session initiated this date. OT roles, goals and purpose discussed with pt as  well as therapy schedule. EOB level ADL completed this date with levels of assist listed above. OT provided instruction on LB dressing with reacher to increase independence with task, reports reacher within the home. Pt reports increased pain in RLE throughout session, premedicated. OT provided pain intervention with ice pack. Pt requires increased time to complete activity d/t pain and displaying limited safety awareness with RW. Pt would benefit from skilled OT in IPR setting in order to maximize independence with ADLs upon D/C.   ADL ADL Eating: Independent Where Assessed-Eating:  Edge of bed Grooming: Setup Where Assessed-Grooming: Edge of bed Upper Body Bathing: Supervision/safety Where Assessed-Upper Body Bathing: Edge of bed Lower Body Bathing: Supervision/safety Where Assessed-Lower Body Bathing: Edge of bed Upper Body Dressing: Setup Lower Body Dressing: Contact guard Where Assessed-Lower Body Dressing: Edge of bed Toileting: Minimal assistance Where Assessed-Toileting: Bedside Commode Toilet Transfer: Furniture conservator/restorer Method: Proofreader: Gaffer: Not assessed Film/video editor: Not assessed Mobility  Bed Mobility Bed Mobility: Supine to Sit Supine to Sit: Minimal Assistance - Patient > 75% Transfers Stand to Sit: Minimal Assistance - Patient > 75%   Discharge Criteria: Patient will be discharged from OT if patient refuses treatment 3 consecutive times without medical reason, if treatment goals not met, if there is a change in medical status, if patient makes no progress towards goals or if patient is discharged from hospital.  The above assessment, treatment plan, treatment alternatives and goals were discussed and mutually agreed upon: by patient  Anais Koenen Woods-Chance, MS, OTR/L 10/05/2023, 10:23 AM

## 2023-10-05 NOTE — Plan of Care (Signed)
  Problem: RH Balance Goal: LTG: Patient will maintain dynamic sitting balance (OT) Description: LTG:  Patient will maintain dynamic sitting balance with assistance during activities of daily living (OT) Flowsheets (Taken 10/05/2023 1609) LTG: Pt will maintain dynamic sitting balance during ADLs with: Independent with assistive device

## 2023-10-06 LAB — GLUCOSE, CAPILLARY
Glucose-Capillary: 144 mg/dL — ABNORMAL HIGH (ref 70–99)
Glucose-Capillary: 196 mg/dL — ABNORMAL HIGH (ref 70–99)
Glucose-Capillary: 89 mg/dL (ref 70–99)
Glucose-Capillary: 134 mg/dL — ABNORMAL HIGH (ref 70–99)

## 2023-10-06 MED ORDER — TIZANIDINE HCL 4 MG PO TABS
2.0000 mg | ORAL_TABLET | Freq: Four times a day (QID) | ORAL | Status: DC | PRN
  Administered 2023-10-06 – 2023-10-10 (×5): 2 mg via ORAL
  Filled 2023-10-06 (×6): qty 1

## 2023-10-06 NOTE — Progress Notes (Signed)
 PROGRESS NOTE   Subjective/Complaints:  Pt doing ok, but slept poorly d/t muscle spasms-- doesn't feel like robaxin does enough. Pain manageable for the most part but has the spasms intermittently and they're bothersome. LBM 2d ago but documented yesterday though pt adamant that he didn't have a BM yesterday. Urinating fine. No other complaints or concerns.   ROS: as per HPI. Denies CP, SOB, abd pain, N/V/D, or any other complaints at this time.    Objective:   No results found. Recent Labs    10/04/23 0408  WBC 11.0*  HGB 8.6*  HCT 25.3*  PLT 372   Recent Labs    10/04/23 0408  NA 137  K 4.0  CL 102  CO2 24  GLUCOSE 139*  BUN 25*  CREATININE 1.01  CALCIUM  8.6*      Intake/Output Summary (Last 24 hours) at 10/06/2023 1048 Last data filed at 10/06/2023 0823 Gross per 24 hour  Intake 467 ml  Output 2200 ml  Net -1733 ml        Physical Exam: Vital Signs Blood pressure 102/68, pulse 82, temperature 98.7 F (37.1 C), resp. rate 15, height 5' 7 (1.702 m), weight 77.8 kg, SpO2 92%.  Constitutional: No distress . Vital signs reviewed. Resting comfortably in bed.  HEENT: NCAT, EOMI, oral membranes moist, a little HOH Neck: supple Cardiovascular: RRR without m/r/g appreciated. No JVD    Respiratory/Chest: CTA Bilaterally without wheezes or rales. Normal effort    GI/Abdomen: soft, +BS throughout, non-tender, non-distended Ext: no clubbing, cyanosis, or edema, R hip incisions covered.  Psych: pleasant and cooperative Skin: dry, warm, wound vac R hip   PRIOR EXAMS: Musculoskeletal:        General: Swelling and tenderness (right hip) present. Normal range of motion.     Cervical back: Normal range of motion.  Skin:    Comments: Distal surgical incisions clean and intact with minimal drainage, foam dressing. Proximal incision with vac in place, serosanguinous drainage present in can. Bruising present   Neurological:     Mental Status: He is alert.     Comments: Alert and oriented x 3. Normal insight and awareness. Intact Memory. Normal language and speech. Cranial nerve exam unremarkable. MMT: BUE 5/5. RLE limited by pain proximally but 4+/5 ADF/PF. LLE 4/5 prox to 5/5 distally. Sensory exam normal for light touch and pain in all 4 limbs. No limb ataxia or cerebellar signs. No abnormal tone appreciated..    Assessment/Plan: 1. Functional deficits which require 3+ hours per day of interdisciplinary therapy in a comprehensive inpatient rehab setting. Physiatrist is providing close team supervision and 24 hour management of active medical problems listed below. Physiatrist and rehab team continue to assess barriers to discharge/monitor patient progress toward functional and medical goals  Care Tool:  Bathing    Body parts bathed by patient: Right arm, Left arm, Chest, Abdomen, Front perineal area, Buttocks, Right upper leg, Left upper leg, Right lower leg, Left lower leg, Face         Bathing assist Assist Level: Contact Guard/Touching assist     Upper Body Dressing/Undressing Upper body dressing   What is the patient wearing?: Pull over shirt  Upper body assist Assist Level: Set up assist    Lower Body Dressing/Undressing Lower body dressing      What is the patient wearing?: Underwear/pull up     Lower body assist Assist for lower body dressing: Contact Guard/Touching assist     Toileting Toileting    Toileting assist Assist for toileting: Contact Guard/Touching assist     Transfers Chair/bed transfer  Transfers assist     Chair/bed transfer assist level: Minimal Assistance - Patient > 75%     Locomotion Ambulation   Ambulation assist      Assist level: Minimal Assistance - Patient > 75% Assistive device: Walker-rolling Max distance: 72ft   Walk 10 feet activity   Assist     Assist level: Minimal Assistance - Patient > 75% Assistive device:  Walker-rolling   Walk 50 feet activity   Assist Walk 50 feet with 2 turns activity did not occur: Safety/medical concerns (pain, weakness)         Walk 150 feet activity   Assist Walk 150 feet activity did not occur: Safety/medical concerns (pain, weakness)         Walk 10 feet on uneven surface  activity   Assist Walk 10 feet on uneven surfaces activity did not occur: Safety/medical concerns (pain, weakness)         Wheelchair     Assist Is the patient using a wheelchair?: Yes Type of Wheelchair: Manual    Wheelchair assist level: Supervision/Verbal cueing Max wheelchair distance: 174ft    Wheelchair 50 feet with 2 turns activity    Assist        Assist Level: Supervision/Verbal cueing   Wheelchair 150 feet activity     Assist      Assist Level: Supervision/Verbal cueing   Blood pressure 102/68, pulse 82, temperature 98.7 F (37.1 C), resp. rate 15, height 5' 7 (1.702 m), weight 77.8 kg, SpO2 92%.  Medical Problem List and Plan: 1. Functional deficits secondary to right intertrochanteric hip fx after fall at work. S/p right IM rod on 09/27/23             -patient may not yet shower             -ELOS/Goals: 12-14 days, supervision to mod I goals with PT and OT  -Continue CIR 2.  Antithrombotics: -DVT/anticoagulation:  Mechanical: Antiembolism stockings, thigh (TED hose) Bilateral lower extremities Sequential compression devices, below knee Bilateral lower extremities             -antiplatelet therapy: Holding Eliquis  3. Pain Management: Tylenol , Robaxin, and Norco as needed -10/06/23 robaxin ineffective, will switch to Tizanidine 2mg  q6h prn, could increase dose if still ineffective. Monitor for effect.  4. Mood/Behavior/Sleep: LCSW to follow for evaluation and support when available.              -antipsychotic agents: N/A             - Insomnia: Ambien  5 mg as needed 5. Neuropsych/cognition: This patient is capable of making decisions  on his own behalf. 6. Skin/Wound Care: Maintain proximal incisional wound VAC per Ortho until drainage ceases. WOC Rn signed off             -foam dressing to lower incision             - Routine pressure-relief measures 7. Fluids/Electrolytes/Nutrition: Monitor intake and output and follow-up chemistries.             - Diet changed to carb  modified-continue Glucerna             - Vitamin C and zinc 8. HLD: Zetia  10 mg 9. HTN: Coreg  12.5 mg BID, Spironolactone  12.5mg  daily, holding Entresto              - Monitor blood pressures with increased activity  -10/4-5/25 BP soft but stable, monitor closely.  Vitals:   10/04/23 1557 10/04/23 1847 10/04/23 1934 10/05/23 0438  BP: 128/88 106/78 (!) 89/59 106/68   10/05/23 0915 10/05/23 1446 10/05/23 2003 10/06/23 0530  BP: 110/66 122/75 94/62 98/66    10/06/23 0816  BP: 102/68    10. T2DM: Resumed home Glipizide  10mg  daily, Farxiga  10mg  daily --Inpatient insulin  needs have been lower than at home  -CBG ACHS with SSI and bedtime coverage. Lantus  20 units BID (ordered daily currently)             - Resume Mounjaro ?   -10/05/23 CBGs pretty good here, monitor -10/06/23 CBG fine except for 8pm... monitor for now, could restart mounjaro  vs change lantus  (ordered daily currently, but note said BID originally?) CBG (last 3)  Recent Labs    10/05/23 1106 10/05/23 2035 10/06/23 0618  GLUCAP 122* 249* 144*    11. CKD IIIa: Baseline creatinine 1.1-1.4.  Stable at 1.1-- Farxiga   12. Chronic systolic CHF: Monitor daily weights and volume status. Holding Entresto , continue Coreg  and aldactone .    -10/4-5/25 wt stable, monitor Filed Weights   10/04/23 1601 10/05/23 0500 10/06/23 0530  Weight: 79.4 kg 80.1 kg 77.8 kg    13. ABLA: Hemoglobin down from 11.7> 9.8> 8.8> 8.6.  Eliquis  held since 10/1             - Monitor H&H closely. Transfuse for hemoglobin less than 8 d/t cardiac hx              -check hgb 10/6 14. Post-op Hematomas: Maintain incisional  wound vac dressing until staying dry. Ortho to advise when to remove. (See above) 15. Slow transit constipation             -60 cc sorbitol tonight followed by SSE if no result             -LBM 9/26. Pt has been eating             -begin daily miralax  BID and senokot-s 2 tabs BID tomorrow -10/06/23 BM 2d ago (despite documentation stating yesterday-- might be error), monitor with current regimen.     LOS: 2 days A FACE TO FACE EVALUATION WAS PERFORMED  [id number] 10/06/2023, 10:48 AM

## 2023-10-07 LAB — CBC WITH DIFFERENTIAL/PLATELET
Abs Immature Granulocytes: 0.16 K/uL — ABNORMAL HIGH (ref 0.00–0.07)
Basophils Absolute: 0.1 K/uL (ref 0.0–0.1)
Basophils Relative: 1 %
Eosinophils Absolute: 0.4 K/uL (ref 0.0–0.5)
Eosinophils Relative: 4 %
HCT: 25.9 % — ABNORMAL LOW (ref 39.0–52.0)
Hemoglobin: 8.4 g/dL — ABNORMAL LOW (ref 13.0–17.0)
Immature Granulocytes: 1 %
Lymphocytes Relative: 21 %
Lymphs Abs: 2.4 K/uL (ref 0.7–4.0)
MCH: 31.5 pg (ref 26.0–34.0)
MCHC: 32.4 g/dL (ref 30.0–36.0)
MCV: 97 fL (ref 80.0–100.0)
Monocytes Absolute: 1 K/uL (ref 0.1–1.0)
Monocytes Relative: 9 %
Neutro Abs: 7.4 K/uL (ref 1.7–7.7)
Neutrophils Relative %: 64 %
Platelets: 479 K/uL — ABNORMAL HIGH (ref 150–400)
RBC: 2.67 MIL/uL — ABNORMAL LOW (ref 4.22–5.81)
RDW: 14.1 % (ref 11.5–15.5)
WBC: 11.5 K/uL — ABNORMAL HIGH (ref 4.0–10.5)
nRBC: 0.2 % (ref 0.0–0.2)

## 2023-10-07 LAB — BASIC METABOLIC PANEL WITH GFR
Anion gap: 10 (ref 5–15)
BUN: 21 mg/dL (ref 8–23)
CO2: 21 mmol/L — ABNORMAL LOW (ref 22–32)
Calcium: 8.3 mg/dL — ABNORMAL LOW (ref 8.9–10.3)
Chloride: 103 mmol/L (ref 98–111)
Creatinine, Ser: 1.15 mg/dL (ref 0.61–1.24)
GFR, Estimated: >60 mL/min (ref >60)
Glucose, Bld: 142 mg/dL — ABNORMAL HIGH (ref 70–99)
Potassium: 3.7 mmol/L (ref 3.5–5.1)
Sodium: 134 mmol/L — ABNORMAL LOW (ref 135–145)

## 2023-10-07 LAB — GLUCOSE, CAPILLARY
Glucose-Capillary: 161 mg/dL — ABNORMAL HIGH (ref 70–99)
Glucose-Capillary: 136 mg/dL — ABNORMAL HIGH (ref 70–99)
Glucose-Capillary: 167 mg/dL — ABNORMAL HIGH (ref 70–99)
Glucose-Capillary: 204 mg/dL — ABNORMAL HIGH (ref 70–99)

## 2023-10-07 LAB — OCCULT BLOOD X 1 CARD TO LAB, STOOL: Fecal Occult Bld: NEGATIVE

## 2023-10-07 NOTE — Progress Notes (Signed)
 Physical Therapy Session Note  Patient Details  Name: John [name] MRN: [id number] Date of Birth: November 14, 1960  Today's Date: 10/07/2023 PT Individual Time: 9099-9041 + 8584-8473 PT Individual Time Calculation (min): 58 min   + 71 min  Short Term Goals: Week 1:  PT Short Term Goal 1 (Week 1): pt will perform bed mobility with min A consistantly PT Short Term Goal 2 (Week 1): pt will transfer sit<>stand with LRAD and CGA PT Short Term Goal 3 (Week 1): pt will ambulate 31ft with LRAD and CGA  Skilled Therapeutic Interventions/Progress Updates:      1st session: Pt presents in recliner, is in agreement to therapy treatment. Pt reports moderate levels of R hip pain, messaged nursing to check on pain medications. Pt eager to return home, repeats this throughout the therapy session. Set up family education and training for Wednesday 9-11am. Asked patient for family to send pictures of home entrance. Also encouraged him to have his wife take at least 1 week off work to help him at home.   Donned pants with assist for threading and pulling over hips in standing. Pt able to don a shirt with setupA.  Sit<>Stand with supervision with ++ time due to pain. Completed stand pivot transfer with CGA and RW from recliner to w/c and then transported to day room gym.   Sit<>stand to RW with CGA from w/c. He ambulates 68ft + 46ft with CGA and RW. Gait is antalgic with a step-to pattern, decreased weight bearing on his RLE. Gait distance limited by his pain.   Reviewed curb transfers using 4 step. Pt reports he has x1 step to enter his back entrance of home. Pt completed curb transfer x4 times at CGA/minA level - pt needs min cues for general sequencing and minA for RW management.   Pt returned to his room - was left sitting up in w/c with his needs met. Pt set to call his wife   2nd session: Pt presents in bed with CSW present. Discussed plan for potential DC by end of week. Pt asking for wheelchair to  be ordered through [employer] - CSW made aware.   Pt reports 5/10 R hip pain, aware he received pain Rx earlier and isn't due until PM. Mobility and distraction provided for pain management.   Bed mobility completed with supervision with ++ time due to pain. Sit<>stand and stand pivot transfer to w/c at supervision level with the RW, cues for reaching for wheelchair before sitting to help with controlled descent.   Gait training in rehab gym 62ft + 20ft + 46ft with supervision and RW, wheelchair follow due to tolerating only short distances. Gait distances limited most by hip pain > fatigue. Seated rest breaks for recovery and pain management. Pt ambulates with antalgic gait pattern, imited weight bearing through RLE, limited heel strike on RLE and step-to pattern. Worked on progress to reciprocal stepping and improving weight acceptance on his RLE with support through walker frame.  Completed car transfer with car height set to simulate their [employer]. Pt completed with minA for only assisting with RLE to exit the vehicle - increased difficulty due to elevated floor height. ++ time for pain management.   Pt returned to his room and assisted to bed with a supervision stand pivot transfer using the RW. Able to go from sitting to supine with supervision with ++ time. Left with needs met.   Therapy Documentation Precautions:  Precautions Precautions: Fall, Other (comment) Recall of  Precautions/Restrictions: Intact Precaution/Restrictions Comments: wound vac Restrictions Weight Bearing Restrictions Per Provider Order: Yes RLE Weight Bearing Per Provider Order: Weight bearing as tolerated Other Position/Activity Restrictions: Unrestricted RLE ROM General:      Therapy/Group: Individual Therapy  Sherlean SHAUNNA Perks 10/07/2023, 9:19 AM

## 2023-10-07 NOTE — Progress Notes (Signed)
 Inpatient Rehabilitation Care Coordinator Assessment and Plan Patient Details  Name: John [name] MRN: [id number] Date of Birth: Nov 21, 1960  Today's Date: 10/07/2023  Hospital Problems: Principal Problem:   Debility  Past Medical History:  Past Medical History:  Diagnosis Date   AICD (automatic cardioverter/defibrillator) present    Arthritis    Diabetes mellitus without complication (HCC)    Hyperlipidemia    Hypertension    Stroke [facility]) 05/2020   Past Surgical History:  Past Surgical History:  Procedure Laterality Date   ICD IMPLANT N/A 06/02/2021   Procedure: ICD IMPLANT;  Surgeon: John Elspeth BROCKS, MD;  Location: [facility] INVASIVE CV LAB;  Service: Cardiovascular;  Laterality: N/A;   INTRAMEDULLARY (IM) NAIL INTERTROCHANTERIC Right 09/27/2023   Procedure: FIXATION, FRACTURE, INTERTROCHANTERIC, WITH INTRAMEDULLARY ROD;  Surgeon: John Cordella SQUIBB, MD;  Location: [facility] OR;  Service: Orthopedics;  Laterality: Right;   LEFT HEART CATH AND CORONARY ANGIOGRAPHY N/A 08/18/2020   Procedure: LEFT HEART CATH AND CORONARY ANGIOGRAPHY;  Surgeon: [place], Peter M, MD;  Location: [facility] INVASIVE CV LAB;  Service: Cardiovascular;  Laterality: N/A;   TONSILLECTOMY     Social History:  reports that he has quit smoking. He has never used smokeless tobacco. He reports current alcohol use. He reports that he does not use drugs.  Family / Support Systems Marital Status: Married Patient Roles: Spouse, Parent Spouse/Significant Other: John [name] Children: John [name] Anticipated Caregiver: John [name] Ability/Limitations of Caregiver: Work Medical laboratory scientific officer: 24/7 Family Dynamics: Good family dynamics  Social History Preferred language: English Religion:  Education: High school Health Literacy - How often do you need to have someone help you when you read instructions, pamphlets, or other written material from your doctor or pharmacy?: Never Writes: Yes Employment Status: Employed Return to Work Plans: Plans to  return to work when able   Abuse/Neglect Abuse/Neglect Assessment Can Be Completed: Yes Physical Abuse: Denies Verbal Abuse: Denies Sexual Abuse: Denies Exploitation of patient/patient's resources: Denies Self-Neglect: Denies  Patient response to: Social Isolation - How often do you feel lonely or isolated from those around you?: Never  Emotional Status Pt's affect, behavior and adjustment status: Adjusting to therapy Recent Psychosocial Issues: None Psychiatric History: None Substance Abuse History: None  Patient / Family Perceptions, Expectations & Goals Pt/Family understanding of illness & functional limitations: Patient/family understanding of functional limitations Premorbid pt/family roles/activities: Active in the community Anticipated changes in roles/activities/participation: Anticipate some changes in roles/activities Pt/family expectations/goals: Patient would like to get home as soon as possible  [employer]: None Premorbid Home Care/DME Agencies: Other (Comment) (Has DME: Shower bench, 4 prong cane, 2 walkers, possibly a wheelchair) Transportation available at discharge: Yes Is the patient able to respond to transportation needs?: Yes In the past 12 months, has lack of transportation kept you from medical appointments or from getting medications?: No In the past 12 months, has lack of transportation kept you from meetings, work, or from getting things needed for daily living?: No  Discharge Planning Living Arrangements: Spouse/significant other Support Systems: Spouse/significant other, Children, Other (Comment) Type of Residence: Private residence Insurance Resources:  (GENERIC WORKER'S COMP / GENERIC WORKER'S COMP) Financial Screen Referred: No Living Expenses: Own Money Management: Patient, Spouse Does the patient have any problems obtaining your medications?: No Home Management: Patient/spouse manages home Patient/Family  Preliminary Plans: Patient will return back hpme with wife and family support Care Coordinator Anticipated Follow Up Needs: HH/OP Expected length of stay: 12-14 days  Clinical Impression CSW met with patient/family to introduce herself  and complete initial assessment. Patient is Ax[employer] and able to make all needs known. John [name] is a 63 y/o male who admitted to [facility] due to being injured on the job. His is workers comp and everything he needs will have to go through them. He reports that he has a great amount of support at home and his home is handicapped equipped. He does have DME in the home as well.  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 Therapy goals include regaining prior independence to work again.Transportation will be provided by someone in his family upon discharge. His wife does not drive. He would like to leave by Friday 10/10. There were no further needs or concerns at present. CSW will follow up with family and continue to follow.   John  [name] 10/07/2023, 2:43 PM

## 2023-10-07 NOTE — Progress Notes (Signed)
 Inpatient Rehabilitation Center Individual Statement of Services  Patient Name:  John [name]  Date:  10/07/2023  Welcome to the Inpatient Rehabilitation Center.  Our goal is to provide you with an individualized program based on your diagnosis and situation, designed to meet your specific needs.  With this comprehensive rehabilitation program, you will be expected to participate in at least 3 hours of rehabilitation therapies Monday-Friday, with modified therapy programming on the weekends.  Your rehabilitation program will include the following services:  Physical Therapy (PT), Occupational Therapy (OT), Speech Therapy (ST), 24 hour per day rehabilitation nursing, Therapeutic Recreaction (TR), Care Coordinator, Rehabilitation Medicine, Nutrition Services, and Pharmacy Services  Weekly team conferences will be held on Wednesday to discuss your progress.  Your Inpatient Rehabilitation Care Coordinator will talk with you frequently to get your input and to update you on team discussions.  Team conferences with you and your family in attendance may also be held.  Expected length of stay: 12-14 days  Overall anticipated outcome: Independent with assistive device    Depending on your progress and recovery, your program may change. Your Inpatient Rehabilitation Care Coordinator will coordinate services and will keep you informed of any changes. Your Inpatient Rehabilitation Care Coordinator's name and contact numbers are listed  below.  The following services may also be recommended but are not provided by the Inpatient Rehabilitation Center:  Driving Evaluations Home Health Rehabiltiation Services Outpatient Rehabilitation Services Vocational Rehabilitation   Arrangements will be made to provide these services after discharge if needed.  Arrangements include referral to agencies that provide these services.  Your insurance has been verified to be:  GENERIC WORKER'S COMP / [employer] [place]  COMP  Your primary doctor is:  Berneta Elsie Sayre, MD  Pertinent information will be shared with your doctor and your insurance company.  Inpatient Rehabilitation Care Coordinator:  Di'Asia Loreli SIERRAS [phone] or ELIGAH BRINKS  Information discussed with and copy given to patient by: Waverly Loreli, 10/07/2023, 2:47 PM

## 2023-10-07 NOTE — Progress Notes (Signed)
 Occupational Therapy Session Note  Patient Details  Name: John [name] MRN: [id number] Date of Birth: Jun 29, 1960  Today's Date: 10/07/2023 OT Individual Time: 8894-8796 OT Individual Time Calculation (min): 58 min    Short Term Goals: Week 1:  OT Short Term Goal 1 (Week 1): Pt will complete STS at RW with Min VC for safety awareness OT Short Term Goal 2 (Week 1): Pt will complete LB dressing with set-up A OT Short Term Goal 3 (Week 1): Pt will complete toilet transfers with supervision A OT Short Term Goal 4 (Week 1): Pt will complete 3/3 toileting steps with supervsion  Skilled Therapeutic Interventions/Progress Updates:   Patient received supine in bed.  Patient reports significant hip pain following PT session.  Patient agreeable to get out of bed for OT session.  Unable to shower due to wound vac this session.  Patient feels he will move better after a shower as the warm water will help with stiffness - he noted benefit of this prior to this fall/ surgery.  Patient able to transition to sitting from supine with verbal cueing and increased time.  Patient declined bathing, as he bathed last night, and declined changing clothes as his clothing was clean this am.  Patient transferred via stand step with RW and CGA to wheelchair and transported to ADL apartment.  Patient needs cues to push up from wheelchair versus pulling self on walker to stand.  Patient needs cues to place heel of right hand on walker - gripping walker with right wrist extension and using 2-3 fingers.  Patient reports some arthritis in right wrist which may be limiting grip on walker - will continue to monitor.  Practiced simulated shower stall transfer, and problem solved best method for him at home.  Patient eager to get home.  Patient returned to room, and returned to bed - sidestepping right to head of bed was difficult and required extra time.  Patient needs assist to get RLE into bed.  Ice applied to front of right hip.   Bed alarm engaged and personal items in reach.    Therapy Documentation Precautions:  Precautions Precautions: Fall, Other (comment) Recall of Precautions/Restrictions: Intact Precaution/Restrictions Comments: wound vac Restrictions Weight Bearing Restrictions Per Provider Order: Yes RLE Weight Bearing Per Provider Order: Weight bearing as tolerated Other Position/Activity Restrictions: Unrestricted RLE ROM   Pain: Pain Assessment Pain Scale: 0-10 Pain Score: 7-8/10 right hip Ice pack applied to anterior hip      Therapy/Group: Individual Therapy  [name] 10/07/2023, 12:14 PM

## 2023-10-07 NOTE — Progress Notes (Addendum)
 Patient ID: John [name], male   DOB: [birth year], 63 y.o.   MRN: [id number]  1511: Spoke with patients wife who is understanding that patient wants to leave early and that is not as realistic regarding safety. Will continue to update as needed.   1525: Spoke with [place] - patients workers IT sales professional - she needs clinicals and progress notes faxed over.   1539: SW Psychologist, prison and probation services, progress notes, HH orders.

## 2023-10-07 NOTE — Progress Notes (Signed)
 Inpatient Rehabilitation  Patient information reviewed and entered into eRehab system by [facility]. Karen Kays., CCC/SLP, PPS Coordinator.  Information including medical coding, functional ability and quality indicators will be reviewed and updated through discharge.

## 2023-10-07 NOTE — IPOC Note (Signed)
 Overall Plan of Care [facility]) Patient Details Name: John [name] MRN: [id number] DOB: [birth year]  Admitting Diagnosis: Right hip fracture  Hospital Problems: Principal Problem:   Debility     Functional Problem List: Nursing Bowel, Endurance, Medication Management, Pain, Safety  PT Balance, Edema, Endurance, Motor, Nutrition, Pain, Skin Integrity  OT Balance, Endurance, Motor, Pain, Safety, Perception, Skin Integrity  SLP    TR         Basic ADL's: OT Grooming, Bathing, Dressing, Toileting     Advanced  ADL's: OT       Transfers: PT Bed Mobility, Bed to Chair, Car, Occupational psychologist, Research scientist (life sciences): PT Ambulation, Psychologist, prison and probation services, Stairs     Additional Impairments: OT None  SLP        TR      Anticipated Outcomes Item Anticipated Outcome  Self Feeding Mod-i  Swallowing      Basic self-care  Mod-I  Toileting  Mod-I   Bathroom Transfers Mod-I  Bowel/Bladder  manage bowel w mod I assist  Transfers  Mod I with LRAD  Locomotion  Mod I with LRAD  Communication     Cognition     Pain  Pain < 4 with prns  Safety/Judgment  Manage safety w cues   Therapy Plan: PT Intensity: Minimum of 1-2 x/day ,45 to 90 minutes PT Frequency: 5 out of 7 days PT Duration Estimated Length of Stay: 10-12 days OT Intensity: Minimum of 1-2 x/day, 45 to 90 minutes OT Frequency: 5 out of 7 days OT Duration/Estimated Length of Stay: 10 days     Team Interventions: Nursing Interventions Patient/Family Education, Medication Management, Bowel Management, Disease Management/Prevention, Pain Management, Discharge Planning  PT interventions Ambulation/gait training, Discharge planning, Functional mobility training, Psychosocial support, Therapeutic Activities, Balance/vestibular training, Disease management/prevention, Neuromuscular re-education, Skin care/wound management, Therapeutic Exercise, Wheelchair propulsion/positioning, DME/adaptive equipment  instruction, Pain management, Splinting/orthotics, UE/LE Strength taining/ROM, Community reintegration, Equities trader education, Museum/gallery curator, UE/LE Coordination activities  OT Interventions Warden/ranger, Functional electrical stimulation, Pain management, Self Care/advanced ADL retraining, Therapeutic Activities, UE/LE Coordination activities, Disease mangement/prevention, Functional mobility training, Patient/family education, Skin care/wound managment, Therapeutic Exercise, DME/adaptive equipment instruction, Community reintegration, Psychosocial support, Neuromuscular re-education, UE/LE Strength taining/ROM, Wheelchair propulsion/positioning  SLP Interventions    TR Interventions    SW/CM Interventions     Barriers to Discharge MD  Medical stability  Nursing Decreased caregiver support, Home environment access/layout split level 3/0 ste no rails w spouse;Ambulates without AD mainly. He occasionally used a SPC when his knees were bothering him. 1 fall leading to current admission d/t trying to remove a pallet jack underneath a pallet and it gave out and threw him backwards.  ADLs Comments: Indep with ADLs/IADLs. Driving. 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  PT Home environment access/layout, Decreased caregiver support, Wound Care, Other (comments) pain, wound vac, wife at work during the day, RLE weakness  OT Wound Care    SLP      SW       Team Discharge Planning: Destination: PT-Home ,OT- Home , SLP-  Projected Follow-up: PT-Home health PT, OT-  Home health OT, SLP-  Projected Equipment Needs: PT-To be determined, OT- 3 in 1 bedside comode, SLP-  Equipment Details: PT- , OT-  Patient/family involved in discharge planning: PT- Patient, Family member/caregiver,  OT-Patient, SLP-   MD ELOS: 12-14 days Medical Rehab Prognosis:  Excellent Assessment: The patient has been admitted for CIR therapies  with the diagnosis of right  intertrochanteric fracture s/p IM rod 09/27/23. The team will be addressing functional mobility, strength, stamina, balance, safety, adaptive techniques and equipment, self-care, bowel and bladder mgt, patient and caregiver education.. Goals have been set at S/modI.Anticipated discharge destination is home.        See Team Conference Notes for weekly updates to the plan of care

## 2023-10-07 NOTE — Progress Notes (Signed)
 PROGRESS NOTE   Subjective/Complaints: Asks when wound vac can be removed as he really wants to shower, requested Brandi NP to please reach out to ortho to assess incision site  ROS: as per HPI. Denies CP, SOB, abd pain, N/V/D, or any other complaints at this time.    Objective:   No results found. Recent Labs    10/07/23 0617  WBC 11.5*  HGB 8.4*  HCT 25.9*  PLT 479*   Recent Labs    10/07/23 0617  NA 134*  K 3.7  CL 103  CO2 21*  GLUCOSE 142*  BUN 21  CREATININE 1.15  CALCIUM  8.3*      Intake/Output Summary (Last 24 hours) at 10/07/2023 1049 Last data filed at 10/07/2023 0800 Gross per 24 hour  Intake 717 ml  Output 1600 ml  Net -883 ml        Physical Exam: Vital Signs Blood pressure 98/68, pulse 88, temperature 97.9 F (36.6 C), temperature source Oral, resp. rate 16, height 5' 7 (1.702 m), weight 77.6 kg, SpO2 96%.  Constitutional: No distress . Vital signs reviewed. Resting comfortably in bed. BMI 26.79 HEENT: NCAT, EOMI, oral membranes moist, a little HOH Neck: supple Cardiovascular: RRR without m/r/g appreciated. No JVD    Respiratory/Chest: CTA Bilaterally without wheezes or rales. Normal effort    GI/Abdomen: soft, +BS throughout, non-tender, non-distended Ext: no clubbing, cyanosis, or edema, R hip incisions covered.  Psych: pleasant and cooperative Skin: dry, warm, wound vac R hip Musculoskeletal:        General: Swelling and tenderness (right hip) present. Normal range of motion.     Cervical back: Normal range of motion.  Skin:    Comments: Distal surgical incisions clean and intact with minimal drainage, foam dressing. Proximal incision with vac in place, serosanguinous drainage present in can. Bruising present Neurological:     Mental Status: He is alert.     Comments: Alert and oriented x 3. Normal insight and awareness. Intact Memory. Normal language and speech. Cranial nerve  exam unremarkable. MMT: BUE 5/5. RLE limited by pain proximally but 4+/5 ADF/PF. LLE 4/5 prox to 5/5 distally. Sensory exam normal for light touch and pain in all 4 limbs. No limb ataxia or cerebellar signs. No abnormal tone appreciated. Stable 10/6  Assessment/Plan: 1. Functional deficits which require 3+ hours per day of interdisciplinary therapy in a comprehensive inpatient rehab setting. Physiatrist is providing close team supervision and 24 hour management of active medical problems listed below. Physiatrist and rehab team continue to assess barriers to discharge/monitor patient progress toward functional and medical goals  Care Tool:  Bathing    Body parts bathed by patient: Right arm, Left arm, Chest, Abdomen, Front perineal area, Buttocks, Right upper leg, Left upper leg, Right lower leg, Left lower leg, Face         Bathing assist Assist Level: Contact Guard/Touching assist     Upper Body Dressing/Undressing Upper body dressing   What is the patient wearing?: Pull over shirt    Upper body assist Assist Level: Set up assist    Lower Body Dressing/Undressing Lower body dressing      What is the patient  wearing?: Underwear/pull up     Lower body assist Assist for lower body dressing: Contact Guard/Touching assist     Toileting Toileting    Toileting assist Assist for toileting: Contact Guard/Touching assist     Transfers Chair/bed transfer  Transfers assist     Chair/bed transfer assist level: Minimal Assistance - Patient > 75%     Locomotion Ambulation   Ambulation assist      Assist level: Minimal Assistance - Patient > 75% Assistive device: Walker-rolling Max distance: 68ft   Walk 10 feet activity   Assist     Assist level: Minimal Assistance - Patient > 75% Assistive device: Walker-rolling   Walk 50 feet activity   Assist Walk 50 feet with 2 turns activity did not occur: Safety/medical concerns (pain, weakness)         Walk 150  feet activity   Assist Walk 150 feet activity did not occur: Safety/medical concerns (pain, weakness)         Walk 10 feet on uneven surface  activity   Assist Walk 10 feet on uneven surfaces activity did not occur: Safety/medical concerns (pain, weakness)         Wheelchair     Assist Is the patient using a wheelchair?: Yes Type of Wheelchair: Manual    Wheelchair assist level: Supervision/Verbal cueing Max wheelchair distance: 139ft    Wheelchair 50 feet with 2 turns activity    Assist        Assist Level: Supervision/Verbal cueing   Wheelchair 150 feet activity     Assist      Assist Level: Supervision/Verbal cueing   Blood pressure 98/68, pulse 88, temperature 97.9 F (36.6 C), temperature source Oral, resp. rate 16, height 5' 7 (1.702 m), weight 77.6 kg, SpO2 96%.  Medical Problem List and Plan: 1. Functional deficits secondary to right intertrochanteric hip fx after fall at work. S/p right IM rod on 09/27/23             -patient may not yet shower             -ELOS/Goals: 12-14 days, supervision to mod I goals with PT and OT  -Continue CIR  Will contact ortho to determine when wound vac may be able to be removed  2.  Antithrombotics: -DVT/anticoagulation:  Mechanical: Antiembolism stockings, thigh (TED hose) Bilateral lower extremities Sequential compression devices, below knee Bilateral lower extremities             -antiplatelet therapy: Continue holding Eliquis   3. Pain Management: continue Tylenol , Robaxin, and Norco as needed -10/06/23 robaxin ineffective, will switch to Tizanidine 2mg  q6h prn, could increase dose if still ineffective. Monitor for effect.   4. Insomnia: continue Ambien  5 mg as needed  5. Neuropsych/cognition: This patient is capable of making decisions on his own behalf.  6. Skin/Wound Care: Maintain proximal incisional wound VAC per Ortho until drainage ceases. WOC Rn signed off             -foam dressing to  lower incision             - Routine pressure-relief measures  See #1  7. Fluids/Electrolytes/Nutrition: Monitor intake and output and follow-up chemistries.             - Diet changed to carb modified-continue Glucerna             - Vitamin C and zinc  8. HLD: continue Zetia  10 mg  9. HTN: Coreg  12.5 mg BID, Spironolactone  12.5mg   daily, holding Entresto , BP reviewed and is soft             - Monitor blood pressures with increased activity  -10/4-5/25 BP soft but stable, monitor closely.  Vitals:   10/04/23 1847 10/04/23 1934 10/05/23 0438 10/05/23 0915  BP: 106/78 (!) 89/59 106/68 110/66   10/05/23 1446 10/05/23 2003 10/06/23 0530 10/06/23 0816  BP: 122/75 94/62 98/66  102/68   10/06/23 1353 10/06/23 1355 10/06/23 1914 10/07/23 0317  BP: 97/62 100/60 94/60 98/68     10. T2DM: Resumed home Glipizide  10mg  daily, Farxiga  10mg  daily --Inpatient insulin  needs have been lower than at home  -CBG ACHS with SSI and bedtime coverage. Lantus  20 units BID (ordered daily currently)             - Resume Mounjaro ?   -10/05/23 CBGs pretty good here, monitor -10/06/23 CBG fine except for 8pm... monitor for now, could restart mounjaro  vs change lantus  (ordered daily currently, but note said BID originally?) CBG (last 3)  Recent Labs    10/06/23 1620 10/06/23 2027 10/07/23 0531  GLUCAP 89 134* 161*    11. CKD IIIa: Baseline creatinine 1.1-1.4.  Stable at 1.1-- Farxiga   12. Chronic systolic CHF: Monitor daily weights and volume status. Holding Entresto , continue Coreg  and aldactone .    Weight reviewed and is decreased Filed Weights   10/05/23 0500 10/06/23 0530 10/07/23 0513  Weight: 80.1 kg 77.8 kg 77.6 kg    13. ABLA: Hemoglobin down from 11.7> 9.8> 8.8> 8.6.  Eliquis  held since 10/1             - Monitor H&H closely. Transfuse for hemoglobin less than 8 d/t cardiac hx              -check hgb 10/6 14. Post-op Hematomas: Maintain incisional wound vac dressing until staying dry. Ortho to advise  when to remove. (See above) 15. Slow transit constipation             -60 cc sorbitol tonight followed by SSE if no result             -LBM 9/26. Pt has been eating             -begin daily miralax  BID and senokot-s 2 tabs BID tomorrow -10/06/23 BM 2d ago (despite documentation stating yesterday-- might be error), monitor with current regimen.     LOS: 3 days A FACE TO FACE EVALUATION WAS PERFORMED  Sven SQUIBB Eustacio Ellen 10/07/2023, 10:49 AM

## 2023-10-08 LAB — GLUCOSE, CAPILLARY
Glucose-Capillary: 121 mg/dL — ABNORMAL HIGH (ref 70–99)
Glucose-Capillary: 122 mg/dL — ABNORMAL HIGH (ref 70–99)
Glucose-Capillary: 155 mg/dL — ABNORMAL HIGH (ref 70–99)
Glucose-Capillary: 101 mg/dL — ABNORMAL HIGH (ref 70–99)
Glucose-Capillary: 148 mg/dL — ABNORMAL HIGH (ref 70–99)

## 2023-10-08 LAB — CBC WITH DIFFERENTIAL/PLATELET
Abs Immature Granulocytes: 0.13 K/uL — ABNORMAL HIGH (ref 0.00–0.07)
Basophils Absolute: 0.1 K/uL (ref 0.0–0.1)
Basophils Relative: 1 %
Eosinophils Absolute: 0.4 K/uL (ref 0.0–0.5)
Eosinophils Relative: 3 %
HCT: 27.2 % — ABNORMAL LOW (ref 39.0–52.0)
Hemoglobin: 8.8 g/dL — ABNORMAL LOW (ref 13.0–17.0)
Immature Granulocytes: 1 %
Lymphocytes Relative: 19 %
Lymphs Abs: 2.1 K/uL (ref 0.7–4.0)
MCH: 31 pg (ref 26.0–34.0)
MCHC: 32.4 g/dL (ref 30.0–36.0)
MCV: 95.8 fL (ref 80.0–100.0)
Monocytes Absolute: 0.9 K/uL (ref 0.1–1.0)
Monocytes Relative: 8 %
Neutro Abs: 7.7 K/uL (ref 1.7–7.7)
Neutrophils Relative %: 68 %
Platelets: 495 K/uL — ABNORMAL HIGH (ref 150–400)
RBC: 2.84 MIL/uL — ABNORMAL LOW (ref 4.22–5.81)
RDW: 14.5 % (ref 11.5–15.5)
WBC: 11.3 K/uL — ABNORMAL HIGH (ref 4.0–10.5)
nRBC: 0.3 % — ABNORMAL HIGH (ref 0.0–0.2)

## 2023-10-08 MED ORDER — APIXABAN 5 MG PO TABS
5.0000 mg | ORAL_TABLET | Freq: Two times a day (BID) | ORAL | Status: DC
Start: 2023-10-08 — End: 2023-10-11
  Administered 2023-10-08 – 2023-10-11 (×7): 5 mg via ORAL
  Filled 2023-10-08 (×7): qty 1

## 2023-10-08 NOTE — Progress Notes (Signed)
 Physical Therapy Session Note  Patient Details  Name: STEELE STRACENER MRN: [id number] Date of Birth: 1960/04/21  Today's Date: 10/08/2023 PT Individual Time: 0900-0930 PT Individual Time Calculation (min): 30 min   Short Term Goals: Week 1:  PT Short Term Goal 1 (Week 1): pt will perform bed mobility with min A consistantly PT Short Term Goal 2 (Week 1): pt will transfer sit<>stand with LRAD and CGA PT Short Term Goal 3 (Week 1): pt will ambulate 11ft with LRAD and CGA  Skilled Therapeutic Interventions/Progress Updates:      Pt asleep in bed upon arrival. Pt agreeable to therapy. Pt endorses mild pain in R LE, not quantified.  Pt perfomred stand pivot transfer bed<>WC, WC <>BSC with RW and min A; pt overall very guarded of R LE and heavily favoring L LE.   Pt ambulated 2x~20 feet with RW and min A with WC in tow and step to progressing to step though gait-but overall antalgic gait with diminished weight shift to R LE.   Pt with following PT at end of session.   Therapy Documentation Precautions:  Precautions Precautions: Fall, Other (comment) Recall of Precautions/Restrictions: Intact Precaution/Restrictions Comments: wound vac Restrictions Weight Bearing Restrictions Per Provider Order: No RLE Weight Bearing Per Provider Order: Weight bearing as tolerated Other Position/Activity Restrictions: Unrestricted RLE ROM  Therapy/Group: Individual Therapy  [facility], [place], DPT  10/08/2023, 7:29 AM

## 2023-10-08 NOTE — Plan of Care (Signed)
  Problem: Consults Goal: RH GENERAL PATIENT EDUCATION Description: See Patient Education module for education specifics. Outcome: Progressing   Problem: RH BOWEL ELIMINATION Goal: RH STG MANAGE BOWEL WITH ASSISTANCE Description: STG Manage Bowel with mod I Assistance. Outcome: Progressing Goal: RH STG MANAGE BOWEL W/MEDICATION W/ASSISTANCE Description: STG Manage Bowel with Medication with mod I Assistance. Outcome: Progressing   Problem: RH SAFETY Goal: RH STG ADHERE TO SAFETY PRECAUTIONS W/ASSISTANCE/DEVICE Description: STG Adhere to Safety Precautions With cues Assistance/Device. Outcome: Progressing   Problem: RH PAIN MANAGEMENT Goal: RH STG PAIN MANAGED AT OR BELOW PT'S PAIN GOAL Description: Pain < 4 with prns Outcome: Progressing   Problem: RH KNOWLEDGE DEFICIT GENERAL Goal: RH STG INCREASE KNOWLEDGE OF SELF CARE AFTER HOSPITALIZATION Description: Patient and spouse will be able to manage self care at discharge using educational resources independently Outcome: Progressing

## 2023-10-08 NOTE — Progress Notes (Signed)
 PROGRESS NOTE   Subjective/Complaints: No new complaints this morning He is happy wound vac is removed and that incision is healing well Hgb is increasing  ROS: as per HPI. Denies CP, SOB, abd pain, N/V/D, or any other complaints at this time.    Objective:   No results found. Recent Labs    10/07/23 0617 10/08/23 0507  WBC 11.5* 11.3*  HGB 8.4* 8.8*  HCT 25.9* 27.2*  PLT 479* 495*   Recent Labs    10/07/23 0617  NA 134*  K 3.7  CL 103  CO2 21*  GLUCOSE 142*  BUN 21  CREATININE 1.15  CALCIUM  8.3*      Intake/Output Summary (Last 24 hours) at 10/08/2023 1334 Last data filed at 10/08/2023 1300 Gross per 24 hour  Intake 973 ml  Output 1700 ml  Net -727 ml        Physical Exam: Vital Signs Blood pressure 106/62, pulse 86, temperature 98.2 F (36.8 C), temperature source Oral, resp. rate 16, height 5' 7 (1.702 m), weight 77.6 kg, SpO2 98%.  Constitutional: No distress . Vital signs reviewed. Resting comfortably in bed. BMI 26.79 HEENT: NCAT, EOMI, oral membranes moist, a little HOH Neck: supple Cardiovascular: RRR without m/r/g appreciated. No JVD    Respiratory/Chest: CTA Bilaterally without wheezes or rales. Normal effort    GI/Abdomen: soft, +BS throughout, non-tender, non-distended Ext: no clubbing, cyanosis, or edema, R hip incisions covered.  Psych: pleasant and cooperative Skin: dry, warm, wound vac removed from right hip, healing well Musculoskeletal:        General: Swelling and tenderness (right hip) present. Normal range of motion.     Cervical back: Normal range of motion.  Skin:    Comments: Distal surgical incisions clean and intact with minimal drainage, foam dressing. Proximal incision with vac in place, serosanguinous drainage present in can. Bruising present Neurological:     Mental Status: He is alert.     Comments: Alert and oriented x 3. Normal insight and awareness. Intact  Memory. Normal language and speech. Cranial nerve exam unremarkable. MMT: BUE 5/5. RLE limited by pain proximally but 4+/5 ADF/PF. LLE 4/5 prox to 5/5 distally. Sensory exam normal for light touch and pain in all 4 limbs. No limb ataxia or cerebellar signs. No abnormal tone appreciated. Stable 10/7  Assessment/Plan: 1. Functional deficits which require 3+ hours per day of interdisciplinary therapy in a comprehensive inpatient rehab setting. Physiatrist is providing close team supervision and 24 hour management of active medical problems listed below. Physiatrist and rehab team continue to assess barriers to discharge/monitor patient progress toward functional and medical goals  Care Tool:  Bathing    Body parts bathed by patient: Right arm, Left arm, Chest, Abdomen, Front perineal area, Buttocks, Right upper leg, Left upper leg, Right lower leg, Left lower leg, Face         Bathing assist Assist Level: Contact Guard/Touching assist     Upper Body Dressing/Undressing Upper body dressing   What is the patient wearing?: Pull over shirt    Upper body assist Assist Level: Set up assist    Lower Body Dressing/Undressing Lower body dressing  What is the patient wearing?: Underwear/pull up     Lower body assist Assist for lower body dressing: Contact Guard/Touching assist     Toileting Toileting Toileting Activity did not occur (Clothing management and hygiene only): Refused  Toileting assist Assist for toileting: Contact Guard/Touching assist     Transfers Chair/bed transfer  Transfers assist     Chair/bed transfer assist level: Supervision/Verbal cueing     Locomotion Ambulation   Ambulation assist      Assist level: Supervision/Verbal cueing Assistive device: Walker-rolling Max distance: 18   Walk 10 feet activity   Assist     Assist level: Supervision/Verbal cueing Assistive device: Walker-rolling   Walk 50 feet activity   Assist Walk 50 feet  with 2 turns activity did not occur: Safety/medical concerns         Walk 150 feet activity   Assist Walk 150 feet activity did not occur: Safety/medical concerns         Walk 10 feet on uneven surface  activity   Assist Walk 10 feet on uneven surfaces activity did not occur: Safety/medical concerns   Assist level: Supervision/Verbal cueing Assistive device: Walker-rolling   Wheelchair     Assist Is the patient using a wheelchair?: Yes Type of Wheelchair: Manual    Wheelchair assist level: Supervision/Verbal cueing Max wheelchair distance: 144ft    Wheelchair 50 feet with 2 turns activity    Assist        Assist Level: Supervision/Verbal cueing   Wheelchair 150 feet activity     Assist      Assist Level: Supervision/Verbal cueing   Blood pressure 106/62, pulse 86, temperature 98.2 F (36.8 C), temperature source Oral, resp. rate 16, height 5' 7 (1.702 m), weight 77.6 kg, SpO2 98%.  Medical Problem List and Plan: 1. Functional deficits secondary to right intertrochanteric hip fx after fall at work. S/p right IM rod on 09/27/23             -patient may not yet shower             -ELOS/Goals: 12-14 days, supervision to mod I goals with PT and OT  -Continue CIR  Will contact ortho to determine when wound vac may be able to be removed  2.  Antithrombotics: -DVT/anticoagulation:  Mechanical: Antiembolism stockings, thigh (TED hose) Bilateral lower extremities Sequential compression devices, below knee Bilateral lower extremities             -antiplatelet therapy: Continue holding Eliquis   3. Pain Management: continue Tylenol , Robaxin, and Norco as needed -10/06/23 robaxin ineffective, will switch to Tizanidine 2mg  q6h prn, could increase dose if still ineffective. Monitor for effect.   4. Insomnia: continue Ambien  5 mg as needed  5. Neuropsych/cognition: This patient is capable of making decisions on his own behalf.  6. Skin/Wound Care:  Maintain proximal incisional wound VAC per Ortho until drainage ceases. WOC Rn signed off             -foam dressing to lower incision             - Routine pressure-relief measures  See #1  7. Fluids/Electrolytes/Nutrition: Monitor intake and output and follow-up chemistries.             - Diet changed to carb modified-continue Glucerna             - Vitamin C and zinc  8. HLD: continue Zetia  10 mg  9. HTN: Coreg  12.5 mg BID, Spironolactone  12.5mg  daily, holding  Entresto , BP reviewed and is soft             - Monitor blood pressures with increased activity  -10/4-5/25 BP soft but stable, monitor closely.  Vitals:   10/05/23 1446 10/05/23 2003 10/06/23 0530 10/06/23 0816  BP: 122/75 94/62 98/66  102/68   10/06/23 1353 10/06/23 1355 10/06/23 1914 10/07/23 0317  BP: 97/62 100/60 94/60 98/68    10/07/23 1553 10/07/23 2008 10/08/23 0417 10/08/23 1250  BP: 99/75 101/64 (!) 109/52 106/62    10. T2DM: Resumed home Glipizide  10mg  daily, Farxiga  10mg  daily --Inpatient insulin  needs have been lower than at home  -CBG ACHS with SSI and bedtime coverage. Lantus  20 units BID (ordered daily currently)             - Resume Mounjaro ?   -10/05/23 CBGs pretty good here, monitor -10/06/23 CBG fine except for 8pm... monitor for now, could restart mounjaro  vs change lantus  (ordered daily currently, but note said BID originally?) CBG (last 3)  Recent Labs    10/07/23 2123 10/08/23 0616 10/08/23 1138  GLUCAP 204* 122* 155*    11. CKD IIIa: Baseline creatinine 1.1-1.4.  Stable at 1.1-- Farxiga    12. Chronic systolic CHF: Monitor daily weights and volume status. Holding Entresto , continue Coreg  and aldactone .    Weight reviewed and is decreased Filed Weights   10/06/23 0530 10/07/23 0513 10/08/23 0230  Weight: 77.8 kg 77.6 kg 77.6 kg    13. ABLA: Hgb reviewed and is now uptrending, received approval from ortho to restart Eliquis   14. Post-op Hematomas: wound vac d/ced  15. Slow transit  constipation             -60 cc sorbitol tonight followed by SSE if no result             -LBM 9/26. Pt has been eating             -begin daily miralax  BID and senokot-s 2 tabs BID tomorrow LBM 10/6: d/c dulcolax    LOS: 4 days A FACE TO FACE EVALUATION WAS PERFORMED  Darus Hershman P Brevon Dewald 10/08/2023, 1:34 PM

## 2023-10-08 NOTE — Progress Notes (Signed)
 Physical Therapy Session Note  Patient Details  Name: John [name] MRN: [id number] Date of Birth: 03/13/1960  Today's Date: 10/08/2023 PT Individual Time: [id number] and [phone] PT Individual Time Calculation (min): 35 min and 70 min.  Short Term Goals: Week 1:  PT Short Term Goal 1 (Week 1): pt will perform bed mobility with min A consistantly PT Short Term Goal 2 (Week 1): pt will transfer sit<>stand with LRAD and CGA PT Short Term Goal 3 (Week 1): pt will ambulate 63ft with LRAD and CGA  Skilled Therapeutic Interventions/Progress Updates:   First session: Pt presents sitting in w/c and agreeable to therapy.  Pt states pain but pre-medicated and given rest breaks throughout session for pain management.  Pt wheeled to dayroom.  Pt transferred sit to stand w/ CGA and cues for forward lean.  Pt states pain in B knees as well.  Pt amb w/ supervision physical A and mod cueing for sequencing w/ gait and LLE swing phase.  Pt not performing DF to swing LLE.  Pt amb x 40' w/ RW including turn to return to w/c.  Pt attempts pulling up on RW for transfers.  Pt returned to room and performed SPT w/c > bed and then CGA for RLE sit to supine.  Pt remained in bed w/ bed alarm on and all needs in reach.  Second session:  Pt handed off from PT in main gym.  Pt wheeled to [facility] for outdoor therapy.  Pt performed LE there ex to increase strength, calf raises, LAQ, standing marches 3 x 10-15.  Pt c/o increased pain to R hip.  Pt amb x 18' x 2 w/ RW and supervision, but decreased foot clearance RLE.  Pt still requires mod A for cueing for turns, sequencing and walker management.  Pt performed UBE at Level 3 x 10 min, total revolutions of 321, average METS of 2.0 and 30 rpm average.  Pt returned to room and amb x 10' to bed and then CGA for RLE sit to supine.  Ice packs applied to lateral R thigh, bed alarm on and all needs in reach.  Nursing to bring pain meds.     Therapy Documentation Precautions:   Precautions Precautions: Fall, Other (comment) Recall of Precautions/Restrictions: Intact Precaution/Restrictions Comments: wound vac Restrictions Weight Bearing Restrictions Per Provider Order: No RLE Weight Bearing Per Provider Order: Weight bearing as tolerated Other Position/Activity Restrictions: Unrestricted RLE ROM General:   Vital Signs:   Pain:6/10, 8/10 second session     Therapy/Group: Individual Therapy  Reyes SHAUNNA Sierra 10/08/2023, 8:36 AM

## 2023-10-08 NOTE — Progress Notes (Signed)
 Occupational Therapy Session Note  Patient Details  Name: John [name] MRN: [id number] Date of Birth: March 28, 1960  Today's Date: 10/08/2023 OT Individual Time: 8655-8546 OT Individual Time Calculation (min): 69 min    Short Term Goals: Week 1:  OT Short Term Goal 1 (Week 1): Pt will complete STS at RW with Min VC for safety awareness OT Short Term Goal 2 (Week 1): Pt will complete LB dressing with set-up A OT Short Term Goal 3 (Week 1): Pt will complete toilet transfers with supervision A OT Short Term Goal 4 (Week 1): Pt will complete 3/3 toileting steps with supervsion  Skilled Therapeutic Interventions/Progress Updates:     Pt received semi-reclined in bed presenting to be in good spirits receptive to skilled OT session reporting 0/10 pain at rest and 4/10 pain in R LE during mobility- OT offering intermittent rest breaks, repositioning, and therapeutic support to optimize participation in therapy session. Pt requesting to take shower this PM. Focused this session on ADL retraining. He transitioned to EOB with HOB elevated SUP. Ambulatory transfer to walk-in shower using RW CGA +increased time using step-to gait pattern to avoid pain in R LE. Utilized grab bars when side stepping into shower with light MIN A provided for balance and safety. He completed majority of bathing tasks in seated position for energy conservation. Issued Pt long-handled sponge with education provided on purpose and Pt demo'ing teach back as evidence of learning. U/LB bathing completed with SUP- completed lateral leans to wash buttocks and used long-handled sponge to wash lower B LEs. Following shower, Pt able to dry self with SUP and completed functional mobility to EOB using RW CGA +increased time. Sat EOB for U/LB dressing donning [place] shirt with set-up A. Utilized reacher to weave B LEs into underwear and pants with increased time and MIN verbal cues required for positioning. Stood to [employer] with SUP to bring pants to  waist. Applied lotion and deodorant EOB with SUP. Pt returned to bed at end of session with SUP +increased time and effort to lift R LE into bed. Pt was left resting in bed with call bell in reach, bed alarm on, and all needs met.     Therapy Documentation Precautions:  Precautions Precautions: Fall, Other (comment) Recall of Precautions/Restrictions: Intact Restrictions Weight Bearing Restrictions Per Provider Order: Yes RLE Weight Bearing Per Provider Order: Weight bearing as tolerated Other Position/Activity Restrictions: Unrestricted RLE ROM   Therapy/Group: Individual Therapy  Katheryn SHAUNNA [place] 10/08/2023, 2:11 PM

## 2023-10-09 LAB — GLUCOSE, CAPILLARY
Glucose-Capillary: 101 mg/dL — ABNORMAL HIGH (ref 70–99)
Glucose-Capillary: 242 mg/dL — ABNORMAL HIGH (ref 70–99)
Glucose-Capillary: 167 mg/dL — ABNORMAL HIGH (ref 70–99)
Glucose-Capillary: 203 mg/dL — ABNORMAL HIGH (ref 70–99)

## 2023-10-09 MED ORDER — POLYETHYLENE GLYCOL 3350 17 G PO PACK
17.0000 g | PACK | Freq: Every day | ORAL | Status: DC
  Administered 2023-10-10 – 2023-10-11 (×2): 17 g via ORAL
  Filled 2023-10-09 (×2): qty 1

## 2023-10-09 NOTE — Plan of Care (Signed)
  Problem: Consults Goal: RH GENERAL PATIENT EDUCATION Description: See Patient Education module for education specifics. Outcome: Progressing   Problem: RH BOWEL ELIMINATION Goal: RH STG MANAGE BOWEL WITH ASSISTANCE Description: STG Manage Bowel with mod I Assistance. Outcome: Progressing Goal: RH STG MANAGE BOWEL W/MEDICATION W/ASSISTANCE Description: STG Manage Bowel with Medication with mod I Assistance. Outcome: Progressing   Problem: RH SAFETY Goal: RH STG ADHERE TO SAFETY PRECAUTIONS W/ASSISTANCE/DEVICE Description: STG Adhere to Safety Precautions With cues Assistance/Device. Outcome: Progressing   Problem: RH PAIN MANAGEMENT Goal: RH STG PAIN MANAGED AT OR BELOW PT'S PAIN GOAL Description: Pain < 4 with prns Outcome: Progressing   Problem: RH KNOWLEDGE DEFICIT GENERAL Goal: RH STG INCREASE KNOWLEDGE OF SELF CARE AFTER HOSPITALIZATION Description: Patient and spouse will be able to manage self care at discharge using educational resources independently Outcome: Progressing

## 2023-10-09 NOTE — Progress Notes (Signed)
 Occupational Therapy Session Note  Patient Details  Name: John [name] MRN: [id number] Date of Birth: 12/16/1960  Today's Date: 10/09/2023 OT Individual Time: 9097-9043 OT Individual Time Calculation (min): 54 min    Short Term Goals: Week 1:  OT Short Term Goal 1 (Week 1): Pt will complete STS at RW with Min VC for safety awareness OT Short Term Goal 2 (Week 1): Pt will complete LB dressing with set-up A OT Short Term Goal 3 (Week 1): Pt will complete toilet transfers with supervision A OT Short Term Goal 4 (Week 1): Pt will complete 3/3 toileting steps with supervsion  Skilled Therapeutic Interventions/Progress Updates:    Pt received resting in bed presenting to be in good spirits receptive to skilled OT session reporting 5/10 pain in R LE with pain medications provided prior to session- OT offering intermittent rest breaks, repositioning, and therapeutic support to optimize participation in therapy session. Pt's wife present in room upon OT arrival for family education focused therapy session. Provided education on Pt's precautions of WBAT following hip fx, fall prevention, energy conservation techniques, and simple home modifications to increase Pt's safety. Education provided on utilizing gait belt 24/7 at d/c. Recommending Pt have intermittent supervision at d/c, DME recommendations of BSC, RW, and TTB and follow-up HHOT services with Pt and family in agreement with plan. Engaged Pt's wife in assisting with hands on transfers following demonstration and education on technique for TTB, bed, and toilet transfers with emphasis on simple cues to provide, body mechanics to prevent injury, and OT providing feedback following. Pt's family members demonstrating appropriate insight into Pt's deficits and demonstrating teach back as evidence of learning. Pt and Pt's family members reporting all questions were answered at end of session. Pt fels prepared to d/c on 10/10. Pt was left resting in wc  with call bell in reach, seatbelt alarm on, and all needs met.    Therapy Documentation Precautions:  Precautions Precautions: Fall, Other (comment) Recall of Precautions/Restrictions: Intact Weight Bearing Restrictions Per Provider Order: Yes RLE Weight Bearing Per Provider Order: Weight bearing as tolerated Other Position/Activity Restrictions: Unrestricted RLE ROM   Therapy/Group: Individual Therapy  John [name] 10/09/2023, 10:15 AM

## 2023-10-09 NOTE — Patient Care Conference (Signed)
 Inpatient RehabilitationTeam Conference and Plan of Care Update Date: 10/09/2023   Time:11:22 AM   Patient Name: John [name]      Medical Record Number: [id number]  Date of Birth: 04-Jun-1960 Sex: Male         Room/Bed: 4W06C/4W06C-01 Payor Info: Payor: [employer] / Plan: [employer] / Product Type: *No Product type* /    Admit Date/Time:  10/04/2023  3:38 PM  Primary Diagnosis:  Hip fracture [facility])  Hospital Problems: Principal Problem:   Hip fracture [facility]) Active Problems:   Debility    Expected Discharge Date: Expected Discharge Date: 10/11/23  Team Members Present: Physician leading conference: Dr. Sven Elks Social Worker Present: Waverly Gentry, LCSW-A Nurse Present: Barnie Ronde, RN PT Present: Sherlean Perks, PT OT Present: Delon Sharps, OT SLP Present: Rosina Downy, SLP PPS Coordinator present : Eleanor Colon, SLP     Current Status/Progress Goal Weekly Team Focus  Bowel/Bladder      Continent of bowel and bladder; constipation addressed    Continent    Remain continent  Swallow/Nutrition/ Hydration               ADL's   Min assist BADL   Mod I/ SET UP   Improve functional mobility, awareness of adaptive equipment, DME, Discharge planning    Mobility   supervision bed mobility, supervision transfers with RW, supervision short distance gait wtih RW, minA for stairs   Mod I  DC planning, family ed/training, pain management    Communication                Safety/Cognition/ Behavioral Observations               Pain      Pain right LE    Pain < 4 with prns    Norco q 6 hours prn; assess need for and effectiveness of prn meds  Skin      Wound vac off incision; mepilex dressing applied   Incision healing    Monitor skin q shift    Discharge Planning:  Will discharge home with wife who will be able to provide care as needed for a limited time. Has DME in the home, will possibly need a wheelchair. Has  workers comp and they have been notified of needs. Awaiting therapy follow up recommendations.   Team Discussion: Patient admitted with debility post hip fracture with complicated wound management.  Patient on target to meet rehab goals: yes, currently needs min assist - supervision for ADLs. Currently at supervision level for mobility. Primarily limited by pain. Goals for discharge set for mod I overall.  *See Care Plan and progress notes for long and short-term goals.   Revisions to Treatment Plan:  N/a   Teaching Needs: Safety, medications, skin care, transfers, toileting, etc.   Current Barriers to Discharge: Decreased caregiver support  Possible Resolutions to Barriers: Family education 10/09/2023 DME: [facility], [facility] HH follow up services     Medical Summary Current Status: Hip fracture, right hip pain, anemia, overweight, HTN, constipation  Barriers to Discharge: Medical stability;Complicated Wound  Barriers to Discharge Comments: hip fracture, right hip pain, anemia, overweight, HTN, constipation Possible Resolutions to [employer]: wound vac d/ced, continue wound care, continue norco prn, Hgb reviewed and has improved, eliquis  restarted, provide dietary education, continue Coreg , continue senna-docusate   Continued Need for Acute Rehabilitation Level of Care: The patient requires daily medical management by a physician with specialized training in physical medicine and rehabilitation  for the following reasons: Direction of a multidisciplinary physical rehabilitation program to maximize functional independence : Yes Medical management of patient stability for increased activity during participation in an intensive rehabilitation regime.: Yes Analysis of laboratory values and/or radiology reports with any subsequent need for medication adjustment and/or medical intervention. : Yes   I attest that I was present, lead the team conference, and concur with the assessment and  plan of the team.   Fredericka Sober B 10/09/2023, 2:55 PM

## 2023-10-09 NOTE — Progress Notes (Signed)
 PROGRESS NOTE   Subjective/Complaints: No new complaints this morning Wife requests a note for work as she plans to be home with him next week, discussed d/c Friday  ROS: as per HPI. Denies CP, SOB, abd pain, N/V/D, or any other complaints at this time.    Objective:   No results found. Recent Labs    10/07/23 0617 10/08/23 0507  WBC 11.5* 11.3*  HGB 8.4* 8.8*  HCT 25.9* 27.2*  PLT 479* 495*   Recent Labs    10/07/23 0617  NA 134*  K 3.7  CL 103  CO2 21*  GLUCOSE 142*  BUN 21  CREATININE 1.15  CALCIUM  8.3*      Intake/Output Summary (Last 24 hours) at 10/09/2023 0910 Last data filed at 10/09/2023 9178 Gross per 24 hour  Intake 1450 ml  Output 800 ml  Net 650 ml        Physical Exam: Vital Signs Blood pressure 114/74, pulse 80, temperature 98.4 F (36.9 C), temperature source Oral, resp. rate 16, height 5' 7 (1.702 m), weight 77.6 kg, SpO2 100%.  Constitutional: No distress . Vital signs reviewed. Resting comfortably in bed. BMI 26.79 HEENT: NCAT, EOMI, oral membranes moist, a little HOH Neck: supple Cardiovascular: RRR without m/r/g appreciated. No JVD    Respiratory/Chest: CTA Bilaterally without wheezes or rales. Normal effort    GI/Abdomen: soft, +BS throughout, non-tender, non-distended Ext: no clubbing, cyanosis, or edema, R hip incisions covered.  Psych: pleasant and cooperative Skin: dry, warm, wound vac removed from right hip, healing well Musculoskeletal:        General: Swelling and tenderness (right hip) present. Normal range of motion.     Cervical back: Normal range of motion.  Skin:    Comments: Distal surgical incisions clean and intact with minimal drainage, foam dressing. Proximal incision with vac in place, serosanguinous drainage present in can. Bruising present Neurological:     Mental Status: He is alert.     Comments: Alert and oriented x 3. Normal insight and awareness.  Intact Memory. Normal language and speech. Cranial nerve exam unremarkable. MMT: BUE 5/5. RLE limited by pain proximally but 4+/5 ADF/PF. LLE 4+/5 prox to 5/5 distally. Sensory exam normal for light touch and pain in all 4 limbs. No limb ataxia or cerebellar signs. No abnormal tone appreciated. Improved as above 10/8  Assessment/Plan: 1. Functional deficits which require 3+ hours per day of interdisciplinary therapy in a comprehensive inpatient rehab setting. Physiatrist is providing close team supervision and 24 hour management of active medical problems listed below. Physiatrist and rehab team continue to assess barriers to discharge/monitor patient progress toward functional and medical goals  Care Tool:  Bathing    Body parts bathed by patient: Right arm, Left arm, Chest, Abdomen, Front perineal area, Buttocks, Right upper leg, Left upper leg, Right lower leg, Left lower leg, Face         Bathing assist Assist Level: Supervision/Verbal cueing     Upper Body Dressing/Undressing Upper body dressing   What is the patient wearing?: Pull over shirt    Upper body assist Assist Level: Set up assist    Lower Body Dressing/Undressing Lower body dressing  What is the patient wearing?: Underwear/pull up, Pants     Lower body assist Assist for lower body dressing: Supervision/Verbal cueing     Toileting Toileting Toileting Activity did not occur (Clothing management and hygiene only): Refused  Toileting assist Assist for toileting: Contact Guard/Touching assist     Transfers Chair/bed transfer  Transfers assist     Chair/bed transfer assist level: Supervision/Verbal cueing     Locomotion Ambulation   Ambulation assist      Assist level: Supervision/Verbal cueing Assistive device: Walker-rolling Max distance: 18   Walk 10 feet activity   Assist     Assist level: Supervision/Verbal cueing Assistive device: Walker-rolling   Walk 50 feet  activity   Assist Walk 50 feet with 2 turns activity did not occur: Safety/medical concerns         Walk 150 feet activity   Assist Walk 150 feet activity did not occur: Safety/medical concerns         Walk 10 feet on uneven surface  activity   Assist Walk 10 feet on uneven surfaces activity did not occur: Safety/medical concerns   Assist level: Supervision/Verbal cueing Assistive device: Walker-rolling   Wheelchair     Assist Is the patient using a wheelchair?: Yes Type of Wheelchair: Manual    Wheelchair assist level: Supervision/Verbal cueing Max wheelchair distance: 160ft    Wheelchair 50 feet with 2 turns activity    Assist        Assist Level: Supervision/Verbal cueing   Wheelchair 150 feet activity     Assist      Assist Level: Supervision/Verbal cueing   Blood pressure 114/74, pulse 80, temperature 98.4 F (36.9 C), temperature source Oral, resp. rate 16, height 5' 7 (1.702 m), weight 77.6 kg, SpO2 100%.  Medical Problem List and Plan: 1. Functional deficits secondary to right intertrochanteric hip fx after fall at work. S/p right IM rod on 09/27/23             -patient may not yet shower             -ELOS/Goals: 12-14 days, supervision to mod I goals with PT and OT  -Continue CIR  Wound vac removed  2.  Antithrombotics: -DVT/anticoagulation:  Mechanical: Antiembolism stockings, thigh (TED hose) Bilateral lower extremities Sequential compression devices, below knee Bilateral lower extremities             -antiplatelet therapy: Eliquis  restarted  3. Pain Management: continue Tylenol , Robaxin, and Norco as needed -10/06/23 robaxin ineffective, will switch to Tizanidine 2mg  q6h prn, could increase dose if still ineffective. Monitor for effect.   4. Insomnia: continue Ambien  5 mg as needed  5. Neuropsych/cognition: This patient is capable of making decisions on his own behalf.  6. Skin/Wound Care: Maintain proximal incisional  wound VAC per Ortho until drainage ceases. WOC Rn signed off             -foam dressing to lower incision             - Routine pressure-relief measures  See #1  7. Fluids/Electrolytes/Nutrition: Monitor intake and output and follow-up chemistries.             - Diet changed to carb modified-continue Glucerna             - Vitamin C and zinc  8. HLD: continue Zetia  10 mg  9. HTN: Coreg  12.5 mg BID, Spironolactone  12.5mg  daily, holding Entresto , BP reviewed and is soft             -  Monitor blood pressures with increased activity  -10/4-5/25 BP soft but stable, monitor closely.  Vitals:   10/06/23 0530 10/06/23 0816 10/06/23 1353 10/06/23 1355  BP: 98/66 102/68 97/62 100/60   10/06/23 1914 10/07/23 0317 10/07/23 1553 10/07/23 2008  BP: 94/60 98/68 99/75  101/64   10/08/23 0417 10/08/23 1250 10/08/23 2022 10/09/23 0601  BP: (!) 109/52 106/62 96/72 114/74    10. T2DM: Resumed home Glipizide  10mg  daily, Farxiga  10mg  daily --Inpatient insulin  needs have been lower than at home  -CBG ACHS with SSI and bedtime coverage. Continue lantus  20U daily CBG (last 3)  Recent Labs    10/08/23 1702 10/08/23 2021 10/09/23 0601  GLUCAP 101* 148* 101*    11. CKD IIIa: Baseline creatinine 1.1-1.4.  Stable at 1.1-- Farxiga    12. Chronic systolic CHF: Monitor daily weights and volume status. Holding Entresto , continue Coreg  and aldactone .    Weight reviewed and is decreased Filed Weights   10/07/23 0513 10/08/23 0230 10/09/23 0151  Weight: 77.6 kg 77.6 kg 77.6 kg    13. ABLA: Hgb reviewed and is now uptrending, received approval from ortho to restart Eliquis   14. Post-op Hematomas: wound vac d/ced  15. Slow transit constipation: LBM 10/7: d/c dulcolax, decrease miralax  to daily    LOS: 5 days A FACE TO FACE EVALUATION WAS PERFORMED  John [name] P Medrith Veillon 10/09/2023, 9:10 AM

## 2023-10-09 NOTE — Progress Notes (Signed)
 Physical Therapy Session Note  Patient Details  Name: John [name] MRN: [id number] Date of Birth: 08-19-1960  Today's Date: 10/09/2023 PT Individual Time: 1000-1056 + 1415-1526 PT Individual Time Calculation (min): 56 min  + 71 min  Short Term Goals: Week 1:  PT Short Term Goal 1 (Week 1): pt will perform bed mobility with min A consistantly PT Short Term Goal 2 (Week 1): pt will transfer sit<>stand with LRAD and CGA PT Short Term Goal 3 (Week 1): pt will ambulate 53ft with LRAD and CGA  Skilled Therapeutic Interventions/Progress Updates:      1st session: Pt sitting in w/c with his wife, Leita, present at the bedside. Focused session on family education and training to prepare for DC by end of week. Reviewed PT goals, PT POC, DME rec's, follow up therapy recommendations, fall preventions, home safety, pain management, etc. His wife seemed to understand typical healing process and importance of his therapy since she received B TKA a few years ago.   Patient taken to main gym to review safe ambulation. Sit<>Stand to RW with CGA with cues for hand placement to push from armrests rather than pull from RW. He ambulated 41ft + 60ft with CGA (assist from his wife) with PT providing supervision for added safety. Gait continues to be antalgic with decreased weight shift and weight acceptance onto his RLE, step-to gait pattern.  Reviewed stair training (curb) to simulate their home entrance in back of home. Patient completed x4 curb transfers with wife providing minA for RW management. PT providing cueing for ascending with his stronger L foot and descending with weaker R foot.   Finished session reviewing car transfer with car height simulating their personal vehicle. Wife assisted patient from w/c to car with CGA and RW with PT providing supervision. Wife continued to do well with directing and cueing patient for safety and fall prevention; they work well together. Pt able to get BLE in/out of  vehicle without external assist.   Pt returned to his room - updated safety plan and notified NT to reflect family is safe to transfer patient in the room. All needs met at end of treatment. All members feel confident re: DC plan for Friday 10/10.     2nd session: Pt presents in bed sleeping - wakens to voice but reports feeling groggy. Reports 7/10 R hip pain and he asks for pain medicine - RN made aware of his request.   Pt completed bed mobility with supervision with + time for pain control. Pt don's sweatpants at bed level, mod I while using the reacher to help pull pants over knees on his R side. Stand pivot transfer from bed to w/c with supervision and RW, safety cues for hand placement and general safety awareness.   Pt propelled himself at supervision level at w/c level 15ft from his room to main gym.   Gait training 55ft with RW at supervision level - continued to work on addressing antalgic step-to gait pattern to a reciprocal stepping with increased weight acceptance on RLE.   In // bars, worked on BLE strengthening, standing, and stepping: -1x12 knee bends -4x63ft lateral side stepping with // bar support. Increased difficulty stepping R compared to L.  -1x12 kick backs, bilaterally -1x12 seated hip marches, bilaterally  Pt finished session on seated stepper (Kinetron) with L40cm/sec resistance. Cues for full terminal AROM on his R side. Pt completed with intermittent rest breaks, pain > fatigue related. Completed for a total of x10 minutes.  Pt returned to his room and he requested to lie down in bed due to pain and fatigue. Supervision for stand pivot transfer while holding onto the bed rail. Bed mobility completed without assist with + time for pain control. Pillow placed under RLE to aid in comfort. Needs met.     Therapy Documentation Precautions:  Precautions Precautions: Fall, Other (comment) Recall of Precautions/Restrictions: Intact Precaution/Restrictions  Comments: wound vac Restrictions Weight Bearing Restrictions Per Provider Order: No RLE Weight Bearing Per Provider Order: Weight bearing as tolerated Other Position/Activity Restrictions: Unrestricted RLE ROM General:    Therapy/Group: Individual Therapy  Fredick Schlosser P Mckenzey Parcell 10/09/2023, 7:21 AM

## 2023-10-10 ENCOUNTER — Other Ambulatory Visit (HOSPITAL_COMMUNITY): Payer: Self-pay

## 2023-10-10 LAB — CBC WITH DIFFERENTIAL/PLATELET
Abs Immature Granulocytes: 0.09 K/uL — ABNORMAL HIGH (ref 0.00–0.07)
Basophils Absolute: 0.1 K/uL (ref 0.0–0.1)
Basophils Relative: 1 %
Eosinophils Absolute: 0.3 K/uL (ref 0.0–0.5)
Eosinophils Relative: 3 %
HCT: 29.5 % — ABNORMAL LOW (ref 39.0–52.0)
Hemoglobin: 9.9 g/dL — ABNORMAL LOW (ref 13.0–17.0)
Immature Granulocytes: 1 %
Lymphocytes Relative: 23 %
Lymphs Abs: 2.2 K/uL (ref 0.7–4.0)
MCH: 32 pg (ref 26.0–34.0)
MCHC: 33.6 g/dL (ref 30.0–36.0)
MCV: 95.5 fL (ref 80.0–100.0)
Monocytes Absolute: 0.9 K/uL (ref 0.1–1.0)
Monocytes Relative: 9 %
Neutro Abs: 6 K/uL (ref 1.7–7.7)
Neutrophils Relative %: 63 %
Platelets: 604 K/uL — ABNORMAL HIGH (ref 150–400)
RBC: 3.09 MIL/uL — ABNORMAL LOW (ref 4.22–5.81)
RDW: 14.9 % (ref 11.5–15.5)
WBC: 9.6 K/uL (ref 4.0–10.5)
nRBC: 0 % (ref 0.0–0.2)

## 2023-10-10 LAB — GLUCOSE, CAPILLARY
Glucose-Capillary: 95 mg/dL (ref 70–99)
Glucose-Capillary: 140 mg/dL — ABNORMAL HIGH (ref 70–99)
Glucose-Capillary: 98 mg/dL (ref 70–99)
Glucose-Capillary: 136 mg/dL — ABNORMAL HIGH (ref 70–99)

## 2023-10-10 LAB — BASIC METABOLIC PANEL WITH GFR
Anion gap: 9 (ref 5–15)
BUN: 21 mg/dL (ref 8–23)
CO2: 22 mmol/L (ref 22–32)
Calcium: 8.7 mg/dL — ABNORMAL LOW (ref 8.9–10.3)
Chloride: 105 mmol/L (ref 98–111)
Creatinine, Ser: 1.07 mg/dL (ref 0.61–1.24)
GFR, Estimated: >60 mL/min (ref >60)
Glucose, Bld: 103 mg/dL — ABNORMAL HIGH (ref 70–99)
Potassium: 3.9 mmol/L (ref 3.5–5.1)
Sodium: 136 mmol/L (ref 135–145)

## 2023-10-10 MED ORDER — APIXABAN 5 MG PO TABS
5.0000 mg | ORAL_TABLET | Freq: Two times a day (BID) | ORAL | 0 refills | Status: AC
  Filled 2023-10-10: qty 60, 30d supply, fill #0

## 2023-10-10 MED ORDER — HYDROCODONE-ACETAMINOPHEN 5-325 MG PO TABS
1.0000 | ORAL_TABLET | Freq: Four times a day (QID) | ORAL | 0 refills | Status: AC | PRN
  Filled 2023-10-10: qty 28, 4d supply, fill #0

## 2023-10-10 MED ORDER — TIZANIDINE HCL 2 MG PO TABS
2.0000 mg | ORAL_TABLET | Freq: Four times a day (QID) | ORAL | 0 refills | Status: AC | PRN
  Filled 2023-10-10: qty 30, 8d supply, fill #0

## 2023-10-10 MED ORDER — ASCORBIC ACID 1000 MG PO TABS
1000.0000 mg | ORAL_TABLET | Freq: Every day | ORAL | 0 refills | Status: AC
  Filled 2023-10-10: qty 100, 100d supply, fill #0

## 2023-10-10 MED ORDER — POLYETHYLENE GLYCOL 3350 17 GM/SCOOP PO POWD
17.0000 g | Freq: Every day | ORAL | 0 refills | Status: AC
  Filled 2023-10-10: qty 238, 14d supply, fill #0

## 2023-10-10 MED ORDER — HYDROCORTISONE 1 % EX CREA
TOPICAL_CREAM | Freq: Two times a day (BID) | CUTANEOUS | 0 refills | Status: DC | PRN
  Filled 2023-10-10: qty 28, 14d supply, fill #0

## 2023-10-10 MED ORDER — ACETAMINOPHEN 325 MG PO TABS: 325.0000 mg | ORAL_TABLET | ORAL | Status: AC | PRN

## 2023-10-10 NOTE — Progress Notes (Signed)
 Patient ID: John [name], male   DOB: April 16, 1960, 63 y.o.   MRN: [id number]  Have reviewed team conference with pt and family. Both aware and agreeable with targeted d/c date of 10/10 and goals of Independent with assistive device. Sent email to [facility] with DME: [phone] wheelchair and 3-1 commode, as well as HH PT/OT orders. Letter for wife provided to patient as well.

## 2023-10-10 NOTE — Progress Notes (Signed)
 This RN removed staples from right hip per provider order. Incisions cleaned with saline and steri strips placed. Patient tolerated well.

## 2023-10-10 NOTE — Progress Notes (Signed)
 Occupational Therapy Session Note  Patient Details  Name: John [name] MRN: [id number] Date of Birth: 03-02-1960  Today's Date: 10/10/2023 OT Individual Time: 9152-9054 OT Individual Time Calculation (min): 58 min    Short Term Goals: Week 1:  OT Short Term Goal 1 (Week 1): Pt will complete STS at RW with Min VC for safety awareness OT Short Term Goal 2 (Week 1): Pt will complete LB dressing with set-up A OT Short Term Goal 3 (Week 1): Pt will complete toilet transfers with supervision A OT Short Term Goal 4 (Week 1): Pt will complete 3/3 toileting steps with supervsion  Skilled Therapeutic Interventions/Progress Updates:    Patient received seated in wheelchair, frustrated for not being able to shower without assistance.  Patient needed assistance to clear ridge when wheeling into bathroom.  Patent able to transfer to shower bench and bathe with effective use of reacher.  Patient transferred back to chair and dressed while seated in front of sink.  Demonstrated sock aide, and patient able to don right sock with sock aide.  Patient returned to bed at end of session.  Bed alarm engaged and call bell/ personal items in reach.    Therapy Documentation Precautions:  Precautions Precautions: Fall Recall of Precautions/Restrictions: Intact Precaution/Restrictions Comments: wound vac Restrictions Weight Bearing Restrictions Per Provider Order: Yes RLE Weight Bearing Per Provider Order: Weight bearing as tolerated Other Position/Activity Restrictions: Unrestricted RLE ROM  Pain: Pain Assessment Pain Scale: 0-10 Pain Score: 3-4/10 Pain Location: Hip Pain Intervention(s): Medication (See eMAR)   Other Treatments:     Therapy/Group: Individual Therapy  Maye Parkinson M 10/10/2023, 12:51 PM

## 2023-10-10 NOTE — Progress Notes (Signed)
 Inpatient Rehabilitation Care Coordinator Discharge Note   Patient Details  Name: John [name] MRN: [id number] Date of Birth: December 19, 1960   Discharge location: Home with wife providing 24/7 care as needed  Length of Stay: 6 days  Discharge activity level: Independent with assistive device  Home/community participation: Active in the community  Patient response un:Yzjouy Literacy - How often do you need to have someone help you when you read instructions, pamphlets, or other written material from your doctor or pharmacy?: Never  Patient response un:Dnrpjo Isolation - How often do you feel lonely or isolated from those around you?: Never  Services provided included: MD, RD, PT, OT, SLP, RN, CM, TR, Pharmacy, SW  Financial Services:  Field seismologist Utilized: Worker's Comp    Choices offered to/list presented to: Patient/wife  Follow-up services arranged:  Other (Comment) (Arranged by workers comp)           Patient response to transportation need: Is the patient able to respond to transportation needs?: Yes In the past 12 months, has lack of transportation kept you from medical appointments or from getting medications?: No In the past 12 months, has lack of transportation kept you from meetings, work, or from getting things needed for daily living?: No   Patient/Family verbalized understanding of follow-up arrangements:  Yes  Individual responsible for coordination of the follow-up plan: Patient  Confirmed correct DME delivered: [employer]  Loreli 10/10/2023    Comments (or additional information):***  Summary of Stay    Date/Time Discharge Planning CSW  10/09/23 0906 Will discharge home with wife who will be able to provide care as needed for a limited time. Has DME in the home, will possibly need a wheelchair. Has workers comp and they have been notified of needs. Awaiting therapy follow up recommendations. DS       [employer]  Loreli

## 2023-10-10 NOTE — Progress Notes (Signed)
 Physical Therapy Discharge Summary  Patient Details  Name: John [name] MRN: [id number] Date of Birth: Feb 14, 1960  Date of Discharge from PT service:October 10, 2023  Today's Date: 10/10/2023 PT Individual Time: 1300-1356 + [id number] PT Individual Time Calculation (min): 56 min  + 73 min   Patient has met 9 of 9 long term goals due to improved activity tolerance, improved balance, increased strength, increased range of motion, decreased pain, ability to compensate for deficits, and functional use of  right lower extremity.  Patient to discharge at an ambulatory level Modified Independent.   Patient's care partner is independent to provide the necessary physical assistance at discharge.  Reasons goals not met: n/a  Recommendation:  Patient will benefit from ongoing skilled PT services in home health setting to continue to advance safe functional mobility, address ongoing impairments in R sided weakness and pain management, home safety, functional mobility, and minimize fall risk.  Equipment: 18x18 manual w/c  Reasons for discharge: treatment goals met and discharge from hospital  Patient/family agrees with progress made and goals achieved: Yes  PT Discharge Precautions/Restrictions Precautions Precautions: Fall Recall of Precautions/Restrictions: Intact Restrictions Weight Bearing Restrictions Per Provider Order: Yes RLE Weight Bearing Per Provider Order: Weight bearing as tolerated Other Position/Activity Restrictions: Unrestricted RLE ROM Pain Interference Pain Interference Pain Effect on Sleep: 3. Frequently Pain Interference with Therapy Activities: 4. Almost constantly Pain Interference with Day-to-Day Activities: 4. Almost constantly Vision/Perception  Vision - History Ability to See in Adequate Light: 0 Adequate Perception Perception: Within Functional Limits Praxis Praxis: WFL  Cognition Overall Cognitive Status: Within Functional Limits for tasks  assessed Arousal/Alertness: Awake/alert Memory: Appears intact Awareness: Appears intact Problem Solving: Impaired Problem Solving Impairment: Functional basic Safety/Judgment: Appears intact Sensation Sensation Light Touch: Appears Intact Hot/Cold: Appears Intact Proprioception: Appears Intact Stereognosis: Not tested Coordination Gross Motor Movements are Fluid and Coordinated: No Fine Motor Movements are Fluid and Coordinated: Yes Motor  Motor Motor: Within Functional Limits  Mobility Bed Mobility Bed Mobility: Supine to Sit;Rolling Left Rolling Left: Independent with assistive device Supine to Sit: Independent with assistive device Transfers Transfers: Sit to Stand;Stand to Sit Sit to Stand: Independent with assistive device Stand to Sit: Independent with assistive device Stand Pivot Transfers: Supervision/Verbal cueing Transfer (Assistive device): Rolling walker Locomotion  Gait Ambulation: Yes Gait Assistance: Independent with assistive device Gait Distance (Feet): 100 Feet Assistive device: Rolling walker Gait Gait: Yes Gait Pattern: Impaired Gait Pattern: Step-to pattern;Decreased step length - right;Decreased step length - left;Decreased stance time - right;Decreased stride length;Decreased hip/knee flexion - right;Trunk flexed;Poor foot clearance - left;Poor foot clearance - right;Narrow base of support Stairs / Additional Locomotion Stairs: Yes Stairs Assistance: Minimal Assistance - Patient > 75% Stair Management Technique: With walker Number of Stairs: 1 Height of Stairs: 4 (inches) Wheelchair Mobility Wheelchair Mobility: Yes Wheelchair Assistance: Independent with Scientist, research (life sciences): Both upper extremities Wheelchair Parts Management: Needs assistance Distance: 15ft  Trunk/Postural Assessment  Cervical Assessment Cervical Assessment: Within Functional Limits Thoracic Assessment Thoracic Assessment: Within Functional  Limits Lumbar Assessment Lumbar Assessment: Within Functional Limits Postural Control Postural Control: Within Functional Limits  Balance Balance Balance Assessed: Yes Static Sitting Balance Static Sitting - Balance Support: Feet supported;Bilateral upper extremity supported Static Sitting - Level of Assistance: 7: Independent Dynamic Sitting Balance Dynamic Sitting - Balance Support: Feet supported;During functional activity;No upper extremity supported Dynamic Sitting - Level of Assistance: 7: Independent Static Standing Balance Static Standing - Balance Support: Bilateral upper extremity supported;During functional activity Static  Standing - Level of Assistance: 6: Modified independent (Device/Increase time) Dynamic Standing Balance Dynamic Standing - Balance Support: Right upper extremity supported;Left upper extremity supported;During functional activity Dynamic Standing - Level of Assistance: 6: Modified independent (Device/Increase time) Extremity Assessment  RUE Assessment RUE Assessment: Within Functional Limits LUE Assessment LUE Assessment: Within Functional Limits RLE Assessment RLE Assessment: Exceptions to [facility] General Strength Comments: Limited by pain > weakness. Grossly 3/5 LLE Assessment LLE Assessment: Within Functional Limits   1st session: Pt presents in bed to start - reports ongoing R hip pain but had pain medication recently. Patient completed bed mobility mod I with + time for pain management. He needed minA for donning shoes at EOB with difficulty reaching his feet. Discussed AE that may be beneficial for him.   Sit<>stand and stand pivot transfer mod I using the RW from bed to wheelchair. He propelled himself mod I with BUE > 246ft from his room to [facility] elevator. 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   Sit<>Stand from w/c to RW mod I. He ambulates short distances outdoors at mod I level while using the RW. Gait  continues to be antalgic with step-to pattern and decreased weight acceptance on his RLE. Difficulty complete furniture transfers from hard surfaces given his hip pain, as well as low park benches given the height.   Pt returned upstairs to his room - he requested to rest in bed. Transferred mod I with RW and bed mobility completed without assist. All needs met at end. Pt asking if his hip staples will be removed before DC - relayed question to MD via secure chat. Aware of upcoming PT session later in PM.    2nd session: Pt sleeping in bed but wakens to voice. He reports moderate pain levels in his R hip, also reports feeling stiff. Mobility and repositioning provided for pain management.   Supine to sitting EOB mod I. Stand pivot transfer to w/c mod I from bed. He wheeled himself mod I at w/c level to day room gym, ~149ft.   Printed and retrieved HEP exercises for both sitting AND standing, separated for clarity.   Pt reporting urgent need to use restroom - assisted into public restroom and pt able to complete 3/3 toileting tasks without assist. Pt continent of BM - charted.   Pt assisted onto mat table and reviewed HEP supine exercises as listed below. Rest breaks as needed due to his pain. Pt has difficulty straightening his R knee and lacks full knee extension. Discussed importance of maintaining stretching and to minimize keeping legs bent in bed all the time.   Pt returned to his room and assisted to bed. Left with needs met. HEP handouts printed and left at bedside.    Supine exercises: Access Code: [email or web address] URL: [email or web address] Date: 10/10/2023 Prepared by: Sherlean Perks  Exercises - Supine Ankle Pumps  - 1 x daily - 7 x weekly - 3 sets - 10 reps - Supine Gluteal Sets  - 1 x daily - 7 x weekly - 3 sets - 10 reps - Supine Quadricep Sets  - 1 x daily - 7 x weekly - 3 sets - 10 reps - Supine Isometric Hip Adduction with Pillow at Knees  - 1 x daily - 7 x  weekly - 3 sets - 10 reps - Supine Heel Slides  - 1 x daily - 7 x weekly - 3 sets - 10 reps - Supine Short Arc Quad  - 1 x  daily - 7 x weekly - 3 sets - 10 reps - Supine Hip Abduction  - 1 x daily - 7 x weekly - 3 sets - 10 reps - Seated Long Arc Quad  - 1 x daily - 7 x weekly - 3 sets - 10 reps  Patient Education - Conservator, museum/gallery with Environmental consultant and Surgery Precautions  Standing Exercises: Access Code: [email or web address] URL: [email or web address] Date: 10/10/2023 Prepared by: Sherlean Perks  Exercises - Sit to Stand with Counter Support  - 1 x daily - 7 x weekly - 3 sets - 10 reps - Standing March with Counter Support  - 1 x daily - 7 x weekly - 3 sets - 10 reps - Standing Hip Abduction with Counter Support  - 1 x daily - 7 x weekly - 3 sets - 10 reps - Heel Raises with Counter Support  - 1 x daily - 7 x weekly - 3 sets - 10 reps - Mini Squat with Counter Support  - 1 x daily - 7 x weekly - 3 sets - 10 reps - Side Stepping with Counter Support  - 1 x daily - 7 x weekly - 3 sets - 10 reps  Ahana Najera P Jermario Kalmar 10/10/2023, 12:42 PM

## 2023-10-10 NOTE — Progress Notes (Signed)
 Occupational Therapy Discharge Summary  Patient Details  Name: John [name] MRN: [id number] Date of Birth: 17-Jun-1960  Date of Discharge from OT service:October 10, 2023  Patient has met 8 of 9 long term goals due to improved activity tolerance, improved balance, ability to compensate for deficits, and functional use of  RIGHT lower extremity.  Patient to discharge at overall Supervision level.  Patient's care partner is independent to provide the necessary physical and cognitive assistance at discharge.    Reasons goals not met: Patient needs occasional cueing for technique  Recommendation:  Patient will benefit from ongoing skilled OT services in home health setting to continue to advance functional skills in the area of BADL and Reduce care partner burden.  Equipment: No equipment provided  Reasons for discharge: treatment goals met and discharge from hospital  Patient/family agrees with progress made and goals achieved: Yes  OT Discharge Precautions/Restrictions  Precautions Precautions: Fall Recall of Precautions/Restrictions: Intact Restrictions Weight Bearing Restrictions Per Provider Order: Yes RLE Weight Bearing Per Provider Order: Weight bearing as tolerated Other Position/Activity Restrictions: Unrestricted RLE ROM  Pain Pain Assessment Pain Scale: 0-10 Pain Score: 4  Pain Type: Surgical pain Pain Location: Hip Pain Orientation: Right Pain Descriptors / Indicators: Aching Pain Onset: On-going Patients Stated Pain Goal: 0 Pain Intervention(s): Medication (See eMAR) ADL ADL Eating: Independent Where Assessed-Eating: Chair Grooming: Independent Where Assessed-Grooming: Sitting at sink Upper Body Bathing: Independent Where Assessed-Upper Body Bathing: Chair Lower Body Bathing: Modified independent Where Assessed-Lower Body Bathing: Shower Upper Body Dressing: Independent Where Assessed-Upper Body Dressing: Chair Lower Body Dressing: Modified  independent Where Assessed-Lower Body Dressing: Sitting at sink, Standing at sink Toileting: Supervision/safety Where Assessed-Toileting: Teacher, adult education: Close supervision Toilet Transfer Method: Proofreader: Gaffer: Unable to assess Tub/Shower Transfer Method: Unable to assess Film/video editor: Close supervision Film/video editor Method: Designer, industrial/product: Sales promotion account executive Baseline Vision/History: 0 No visual deficits Patient Visual Report: No change from baseline Vision Assessment?: No apparent visual deficits Perception  Perception: Within Functional Limits Praxis Praxis: WFL Cognition Cognition Overall Cognitive Status: Within Functional Limits for tasks assessed Arousal/Alertness: Awake/alert Memory: Appears intact Awareness: Appears intact Problem Solving: Impaired Problem Solving Impairment: Functional basic Safety/Judgment: Appears intact Brief Interview for Mental Status (BIMS) Repetition of Three Words (First Attempt): 3 Temporal Orientation: Year: Correct Temporal Orientation: Month: Accurate within 5 days Temporal Orientation: Day: Correct Recall: Sock: Yes, no cue required Recall: Blue: Yes, no cue required Recall: Bed: Yes, no cue required BIMS Summary Score: 15 Sensation Sensation Light Touch: Appears Intact Hot/Cold: Appears Intact Proprioception: Appears Intact Stereognosis: Not tested Coordination Gross Motor Movements are Fluid and Coordinated: No Fine Motor Movements are Fluid and Coordinated: Yes Motor  Motor Motor: Within Functional Limits Mobility  Bed Mobility Bed Mobility: Supine to Sit;Rolling Left Rolling Left: Independent with assistive device Supine to Sit: Independent with assistive device Transfers Sit to Stand: Independent with assistive device Stand to Sit: Independent with assistive device  Trunk/Postural Assessment   Cervical Assessment Cervical Assessment: Within Functional Limits Thoracic Assessment Thoracic Assessment: Within Functional Limits Lumbar Assessment Lumbar Assessment: Within Functional Limits Postural Control Postural Control: Within Functional Limits  Balance Balance Balance Assessed: Yes Static Sitting Balance Static Sitting - Balance Support: Feet supported;Bilateral upper extremity supported Static Sitting - Level of Assistance: 7: Independent Dynamic Sitting Balance Dynamic Sitting - Balance Support: Feet supported;During functional activity;No upper extremity supported Dynamic Sitting - Level of Assistance: 7: Independent Static  Standing Balance Static Standing - Balance Support: Bilateral upper extremity supported;During functional activity Static Standing - Level of Assistance: 6: Modified independent (Device/Increase time) Dynamic Standing Balance Dynamic Standing - Balance Support: Right upper extremity supported;Left upper extremity supported;During functional activity Dynamic Standing - Level of Assistance: 6: Modified independent (Device/Increase time) Extremity/Trunk Assessment RUE Assessment RUE Assessment: Within Functional Limits LUE Assessment LUE Assessment: Within Functional Limits   Fransico Allean HERO 10/10/2023, 9:42 AM

## 2023-10-10 NOTE — Progress Notes (Signed)
 PROGRESS NOTE   Subjective/Complaints: No new complaints this morning Discussed plan for d/c tomorrow Hgb reviewed and has improved BMP is stable  ROS: as per HPI. Denies CP, SOB, abd pain, N/V/D, or any other complaints at this time. +right hip pain  Objective:   No results found. Recent Labs    10/08/23 0507 10/10/23 0543  WBC 11.3* 9.6  HGB 8.8* 9.9*  HCT 27.2* 29.5*  PLT 495* 604*   Recent Labs    10/10/23 0543  NA 136  K 3.9  CL 105  CO2 22  GLUCOSE 103*  BUN 21  CREATININE 1.07  CALCIUM  8.7*      Intake/Output Summary (Last 24 hours) at 10/10/2023 1122 Last data filed at 10/10/2023 0929 Gross per 24 hour  Intake 1300 ml  Output 1425 ml  Net -125 ml        Physical Exam: Vital Signs Blood pressure 113/87, pulse 80, temperature 98.4 F (36.9 C), temperature source Oral, resp. rate 16, height 5' 7 (1.702 m), weight 78.4 kg, SpO2 98%.  Constitutional: No distress . Vital signs reviewed. Resting comfortably in bed. BMI 26.79 HEENT: NCAT, EOMI, oral membranes moist, a little HOH Neck: supple Cardiovascular: RRR without m/r/g appreciated. No JVD    Respiratory/Chest: CTA Bilaterally without wheezes or rales. Normal effort    GI/Abdomen: soft, +BS throughout, non-tender, non-distended Ext: no clubbing, cyanosis, or edema, R hip incisions covered.  Psych: pleasant and cooperative Skin: dry, warm, wound vac removed from right hip, healing well Musculoskeletal:        General: Swelling and tenderness (right hip) present. Normal range of motion.     Cervical back: Normal range of motion.  Skin:    Comments: Distal surgical incisions clean and intact with minimal drainage, foam dressing. Proximal incision with vac in place, serosanguinous drainage present in can. Bruising present Neurological:     Mental Status: He is alert.     Comments: Alert and oriented x 3. Normal insight and awareness. Intact  Memory. Normal language and speech. Cranial nerve exam unremarkable. MMT: BUE 5/5. RLE limited by pain proximally but 4+/5 ADF/PF. LLE 4+/5 prox to 5/5 distally. Sensory exam normal for light touch and pain in all 4 limbs. No limb ataxia or cerebellar signs. No abnormal tone appreciated. Stable 10/9  Assessment/Plan: 1. Functional deficits which require 3+ hours per day of interdisciplinary therapy in a comprehensive inpatient rehab setting. Physiatrist is providing close team supervision and 24 hour management of active medical problems listed below. Physiatrist and rehab team continue to assess barriers to discharge/monitor patient progress toward functional and medical goals  Care Tool:  Bathing    Body parts bathed by patient: Right arm, Left arm, Chest, Abdomen, Front perineal area, Buttocks, Right upper leg, Left upper leg, Right lower leg, Left lower leg, Face         Bathing assist Assist Level: Independent with assistive device     Upper Body Dressing/Undressing Upper body dressing   What is the patient wearing?: Pull over shirt    Upper body assist Assist Level: Independent    Lower Body Dressing/Undressing Lower body dressing      What is  the patient wearing?: Underwear/pull up, Pants     Lower body assist Assist for lower body dressing: Independent with assitive device     Toileting Toileting Toileting Activity did not occur (Clothing management and hygiene only): N/A (no void or bm)  Toileting assist Assist for toileting: Supervision/Verbal cueing     Transfers Chair/bed transfer  Transfers assist     Chair/bed transfer assist level: Supervision/Verbal cueing     Locomotion Ambulation   Ambulation assist      Assist level: Supervision/Verbal cueing Assistive device: Walker-rolling Max distance: 18   Walk 10 feet activity   Assist     Assist level: Supervision/Verbal cueing Assistive device: Walker-rolling   Walk 50 feet  activity   Assist Walk 50 feet with 2 turns activity did not occur: Safety/medical concerns         Walk 150 feet activity   Assist Walk 150 feet activity did not occur: Safety/medical concerns         Walk 10 feet on uneven surface  activity   Assist Walk 10 feet on uneven surfaces activity did not occur: Safety/medical concerns   Assist level: Supervision/Verbal cueing Assistive device: Walker-rolling   Wheelchair     Assist Is the patient using a wheelchair?: Yes Type of Wheelchair: Manual    Wheelchair assist level: Supervision/Verbal cueing Max wheelchair distance: 170ft    Wheelchair 50 feet with 2 turns activity    Assist        Assist Level: Supervision/Verbal cueing   Wheelchair 150 feet activity     Assist      Assist Level: Supervision/Verbal cueing   Blood pressure 113/87, pulse 80, temperature 98.4 F (36.9 C), temperature source Oral, resp. rate 16, height 5' 7 (1.702 m), weight 78.4 kg, SpO2 98%.  Medical Problem List and Plan: 1. Functional deficits secondary to right intertrochanteric hip fx after fall at work. S/p right IM rod on 09/27/23             -patient may not yet shower             -ELOS/Goals: 7 days, supervision to mod I goals with PT and OT  -Continue CIR  Wound vac removed  Discussed plan for d/c tomorrow  2.  Antithrombotics: -DVT/anticoagulation:  Mechanical: Antiembolism stockings, thigh (TED hose) Bilateral lower extremities Sequential compression devices, below knee Bilateral lower extremities             -antiplatelet therapy: continue Eliquis    3. Pain Management: continue Tylenol , Robaxin, and Norco as needed -10/06/23 robaxin ineffective, will switch to Tizanidine 2mg  q6h prn, could increase dose if still ineffective. Monitor for effect.   4. Insomnia: continue Ambien  5 mg as needed  5. Neuropsych/cognition: This patient is capable of making decisions on his own behalf.  6. Skin/Wound Care:  Maintain proximal incisional wound VAC per Ortho until drainage ceases. WOC Rn signed off             -foam dressing to lower incision             - Routine pressure-relief measures  See #1  7. Fluids/Electrolytes/Nutrition: Monitor intake and output and follow-up chemistries.             - Diet changed to carb modified-continue Glucerna             - Vitamin C and zinc  8. HLD: continue Zetia  10 mg  9. HTN: Coreg  12.5 mg BID, Spironolactone  12.5mg  daily, holding Entresto , BP  reviewed and is soft             - Monitor blood pressures with increased activity  -10/4-5/25 BP soft but stable, monitor closely.  Vitals:   10/06/23 1355 10/06/23 1914 10/07/23 0317 10/07/23 1553  BP: 100/60 94/60 98/68  99/75   10/07/23 2008 10/08/23 0417 10/08/23 1250 10/08/23 2022  BP: 101/64 (!) 109/52 106/62 96/72   10/09/23 0601 10/09/23 1301 10/09/23 2028 10/10/23 0551  BP: 114/74 93/67 (!) 104/42 113/87    10. T2DM: Resumed home Glipizide  10mg  daily, Farxiga  10mg  daily --Inpatient insulin  needs have been lower than at home  -CBG ACHS with SSI and bedtime coverage. Continue lantus  20U daily CBG (last 3)  Recent Labs    10/09/23 1616 10/09/23 2029 10/10/23 0550  GLUCAP 167* 203* 95    11. CKD IIIa: Baseline creatinine 1.1-1.4.  Stable at 1.1-- Farxiga    12. Chronic systolic CHF: Monitor daily weights and volume status. Holding Entresto , continue Coreg  and aldactone .    Weight reviewed and is decreased Filed Weights   10/08/23 0230 10/09/23 0151 10/10/23 0600  Weight: 77.6 kg 77.6 kg 78.4 kg    13. ABLA: Hgb reviewed and is now uptrending, received approval from ortho to restart Eliquis   14. Post-op Hematomas: wound vac d/ced  15. Slow transit constipation: LBM 10/7: d/c dulcolax, decrease miralax  to daily    LOS: 6 days A FACE TO FACE EVALUATION WAS PERFORMED  Dacie Mandel P Drayton Tieu 10/10/2023, 11:22 AM

## 2023-10-10 NOTE — Discharge Instructions (Addendum)
 Inpatient Rehab Discharge Instructions  RODEL GLASPY II Discharge date and time: 10/11/23 Activities/Precautions/ Functional Status: Activity: no lifting, driving, or strenuous exercise for until cleared by MD  Diet: diabetic diet Wound Care: keep wound clean and dry, reinforce dressing PRN, and ice to area for comfort Functional status:  ___ No restrictions     ___ Walk up steps independently ___ 24/7 supervision/assistance   ___ Walk up steps with assistance _X__ Intermittent supervision/assistance  ___ Bathe/dress independently ___ Walk with walker     __X_ Bathe/dress with assistance ___ Walk Independently    ___ Shower independently __X_ Walk with assistance    ___ Shower with assistance __X_ No alcohol     ___ Return to work/school ________  Special Instructions:  -Monitor your blood sugars at home and be sure to follow up with PCP or Endocrinologist as changes have been made to insulin  regimen.    My questions have been answered and I understand these instructions. I will adhere to these goals and the provided educational materials after my discharge from the hospital.  Patient/Caregiver Signature _______________________________ Date __________  Clinician Signature _______________________________________ Date __________  Please bring this form and your medication list with you to all your follow-up doctor's appointments.     STROKE/TIA DISCHARGE INSTRUCTIONS SMOKING Cigarette smoking nearly doubles your risk of having a stroke & is the single most alterable risk factor  If you smoke or have smoked in the last 12 months, you are advised to quit smoking for your health. Most of the excess cardiovascular risk related to smoking disappears within a year of stopping. Ask you doctor about anti-smoking medications  Quit Line: 1-800-QUIT NOW Free Smoking Cessation Classes [phone]  CHOLESTEROL Know your levels; limit fat & cholesterol in your diet  Lipid Panel      Component Value Date/Time   CHOL 93 05/13/2023 1040   CHOL 82 (L) 02/27/2021 0812   TRIG 56.0 05/13/2023 1040   HDL 53.00 05/13/2023 1040   HDL 42 02/27/2021 0812   CHOLHDL 2 05/13/2023 1040   VLDL 11.2 05/13/2023 1040   LDLCALC 29 05/13/2023 1040   LDLCALC 28 02/27/2021 9187     Many patients benefit from treatment even if their cholesterol is at goal. Goal: Total Cholesterol (CHOL) less than 160 Goal:  Triglycerides (TRIG) less than 150 Goal:  HDL greater than 40 Goal:  LDL (LDLCALC) less than 100   BLOOD PRESSURE American Stroke Association blood pressure target is less that 120/80 mm/Hg  Your discharge blood pressure is:  BP: 116/65 Monitor your blood pressure Limit your salt and alcohol intake Many individuals will require more than one medication for high blood pressure  DIABETES (A1c is a blood sugar average for last 3 months) Goal HGBA1c is under 7% (HBGA1c is blood sugar average for last 3 months)  Diabetes: Diagnosis of diabetes:  Your A1c:8.1 %    Lab Results  Component Value Date   HGBA1C 8.1 (A) 07/08/2023    Your HGBA1c can be lowered with medications, healthy diet, and exercise. Check your blood sugar as directed by your physician Call your physician if you experience unexplained or low blood sugars.  PHYSICAL ACTIVITY/REHABILITATION Goal is 30 minutes at least 4 days per week  Activity: Increase activity slowly,, No driving,, and Walk with assistance, Therapies: Physical Therapy: Home Health and Occupational Therapy: Home Health Return to work: n/a Activity decreases your risk of heart attack and stroke and makes your heart stronger.  It helps control your  weight and blood pressure; helps you relax and can improve your mood. Participate in a regular exercise program. Talk with your doctor about the best form of exercise for you (dancing, walking, swimming, cycling).  DIET/WEIGHT Goal is to maintain a healthy weight  Your discharge diet is:  Diet Order              Diet Carb Modified Fluid consistency: Thin; Room service appropriate? Yes  Diet effective now                  Your height is:  Height: 5' 7 (170.2 cm) Your current weight is: Weight: 78.4 kg Your Body Mass Index (BMI) is:  BMI (Calculated): 27.06 Following the type of diet specifically designed for you will help prevent another stroke. Your goal Body Mass Index (BMI) is 19-24. Healthy food habits can help reduce 3 risk factors for stroke:  High cholesterol, hypertension, and excess weight.  RESOURCES Stroke/Support Group:  Call [phone]   STROKE EDUCATION PROVIDED/REVIEWED AND GIVEN TO PATIENT Stroke warning signs and symptoms How to activate emergency medical system (call 911). Medications prescribed at discharge. Need for follow-up after discharge. Personal risk factors for stroke. Pneumonia vaccine given: No Flu vaccine given: No My questions have been answered, the writing is legible, and I understand these instructions.  I will adhere to these goals & educational materials that have been provided to me after my discharge from the hospital.

## 2023-10-10 NOTE — Discharge Summary (Signed)
 Physician Discharge Summary  Patient ID: John [name] MRN: [id number] DOB/AGE: [birth year] 63 y.o.  Admit date: 10/04/2023 Discharge date: 10/11/2023  Discharge Diagnoses:  Principal Problem:   Hip fracture [facility]) Active Problems:   Essential hypertension   Elevated LDL cholesterol level   Diabetes mellitus (HCC)   Ischemic stroke (HCC)   Chronic systolic CHF (congestive heart failure) (HCC)   Coronary artery disease involving native coronary artery of native heart without angina pectoris   Ischemic cardiomyopathy   ABLA (acute blood loss anemia)   Debility   Discharged Condition: stable  Significant Diagnostic Studies: CT HIP RIGHT W CONTRAST Result Date: 10/02/2023 EXAM: CT OF THE RIGHT HIP WITH IV CONTRAST 10/02/2023 12:58:00 PM TECHNIQUE: CT of the right hip was performed with the administration of intravenous contrast. Multiplanar reformatted images are provided for review. Automated exposure control, iterative reconstruction, and/or weight based adjustment of the mA/kV was utilized to reduce the radiation dose to as low as reasonably achievable. COMPARISON: Interoperative right hip spot images from 09/27/2023 and preoperative radiographs from 09/27/2023. CLINICAL HISTORY: Right hip fracture due to fall. FINDINGS: BONES: Right hip proximal femoral nail system in place traversing the comminuted intertrochanteric fracture with a single distal interlocking screw on the im nail component. Aside from the original intertrochanteric fracture, no new or specific complicating fracture is identified. Due to the comminution, there are anterior and posterior fragments of the greater trochanter which are not connected to the nail system. Comminuted lesser trochanteric fragments are displaced medially as on image 63 series 15 and likewise not connected to the nail system. No immediately adjacent pelvic fracture is observed. No aggressive appearing osseous abnormality or periostitis. SOFT TISSUE:  Expected gas tracts along regional gluteal musculature. A 3.0 x 2.7 cm hematoma in the right gluteus medius on image 34 of series 12 and a 4.3 x 2.4 cm hematoma along the margin between the gluteus medius and minimus on image 36 of series 12 likely represent incidental postoperative blood products. Edema tracks along regional fascial planes including the right iliopsoas, right gluteal and proximal anterior thigh musculature, and right hip adductor musculature. Subcutaneous edema noted lateral to the right hip, not unexpected in this setting. There is some fullness of the right piriformis muscle but no sciatic notch impingement is observed. No soft tissue mass. No compelling findings of abscess at this time. JOINT: No significant degenerative changes. No osseous erosions. INTRAPELVIC CONTENTS: Limited images of the intrapelvic contents are unremarkable. Right external iliac and femoral atherosclerosis is present without overt occlusion. IMPRESSION: 1. Comminuted right intertrochanteric fracture postop day 5 status post proximal femoral nail fixation without new fracture. 2. Small hematomas in the right gluteus medius and between the gluteus medius and minimus. No findings of active bleeding. No abscess identified. 3. Right external iliac and femoral atherosclerosis without occlusion. 4. Expected edema tracking along regional muscular tissues and subcutaneous tissues. Electronically signed by: Ryan Salvage MD 10/02/2023 06:46 PM EDT RP Workstation: [id number]   DG FEMUR, MIN 2 VIEWS RIGHT Result Date: 09/27/2023 CLINICAL DATA:  Elective surgery for internal fixation of inter trochanteric fracture of the right hip EXAM: RIGHT FEMUR 2 VIEWS COMPARISON:  09/27/2023 FINDINGS: Intraoperative fluoroscopy is obtained for surgical control purposes. Fluoroscopy time is recorded at 84.6 seconds. Dose 17.9 mGy. 5 spot fluoroscopic images are provided. Spot fluoroscopic images demonstrate internal fixation of an inter  trochanteric fracture of the right hip using an intramedullary rod with femoral neck compression bold and distal locking screw. Near anatomic alignment  of the fracture fragments is suggested. IMPRESSION: Intraoperative fluoroscopy is obtained for surgical control purposes during internal fixation of the inter trochanteric fracture of the proximal right femur. Electronically Signed   By: Elsie Gravely M.D.   On: 09/27/2023 19:51   DG C-Arm 1-60 Min-No Report Result Date: 09/27/2023 Fluoroscopy was utilized by the requesting physician.  No radiographic interpretation.   DG C-Arm 1-60 Min-No Report Result Date: 09/27/2023 Fluoroscopy was utilized by the requesting physician.  No radiographic interpretation.   DG Chest 1 View Result Date: 09/27/2023 EXAM: 1 VIEW(S) XRAY OF THE CHEST 09/27/2023 06:25:47 AM COMPARISON: AP radiograph of the chest dated 06/02/2021. CLINICAL HISTORY: pain FINDINGS: LINES, TUBES AND DEVICES: Left chest wall ICD in place. LUNGS AND PLEURA: Low lung volumes. No focal pulmonary opacity. No pulmonary edema. No pleural effusion. No pneumothorax. HEART AND MEDIASTINUM: No acute abnormality of the cardiac and mediastinal silhouettes. BONES AND SOFT TISSUES: Multilevel thoracic osteophytosis. IMPRESSION: 1. No acute cardiopulmonary abnormality. 2. Left chest wall implantable cardioverter-defibrillator in place. Electronically signed by: Evalene Coho MD 09/27/2023 06:53 AM EDT RP Workstation: [id number]   CT HEAD WO CONTRAST Result Date: 09/27/2023 EXAM: CT HEAD WITHOUT CONTRAST 09/27/2023 06:44:20 AM TECHNIQUE: CT of the head was performed without the administration of intravenous contrast. Automated exposure control, iterative reconstruction, and/or weight based adjustment of the mA/kV was utilized to reduce the radiation dose to as low as reasonably achievable. COMPARISON: CT angiogram of the head dated 05/31/2020. CLINICAL HISTORY: Head trauma, moderate-severe. Pt BIB [facility] from  work at [employer], after ground level fall. Pt was reaching above his head when he lost balance and fell backwards. Pt denies LOC, did hit head and has 2 cm Lac; bleeding controlled. Pt does take eliquis . FINDINGS: BRAIN AND VENTRICLES: No acute hemorrhage. No evidence of acute infarct. No hydrocephalus. No extra-axial collection. No mass effect or midline shift. ORBITS: No acute abnormality. SINUSES: No acute abnormality. SOFT TISSUES AND SKULL: 2 cm Lac; bleeding controlled. No skull fracture. IMPRESSION: 1. No acute intracranial abnormality. Electronically signed by: Evalene Coho MD 09/27/2023 06:52 AM EDT RP Workstation: [id number]   DG Hip Unilat With Pelvis 2-3 Views Right Result Date: 09/27/2023 EXAM: 2 OR MORE VIEW(S) XRAY OF THE RIGHT HIP 09/27/2023 06:25:47 AM COMPARISON: None available. CLINICAL HISTORY: pain FINDINGS: BONES AND JOINTS: Acute comminuted intertrochanteric fracture of the right femur with medially displaced lesser trochanter by 1.2 cm. The hip joint is maintained. No significant degenerative changes. SOFT TISSUES: Vascular calcifications. IMPRESSION: 1. Acute comminuted intertrochanteric fracture of the right femur with medially displaced lesser trochanter by 1.2 cm. Electronically signed by: Waddell Calk MD 09/27/2023 06:51 AM EDT RP Workstation: [id number]    Labs:  Basic Metabolic Panel: Recent Labs  Lab 10/07/23 0617 10/10/23 0543  NA 134* 136  K 3.7 3.9  CL 103 105  CO2 21* 22  GLUCOSE 142* 103*  BUN 21 21  CREATININE 1.15 1.07  CALCIUM  8.3* 8.7*    CBC: Recent Labs  Lab 10/07/23 0617 10/08/23 0507 10/10/23 0543  WBC 11.5* 11.3* 9.6  NEUTROABS 7.4 7.7 6.0  HGB 8.4* 8.8* 9.9*  HCT 25.9* 27.2* 29.5*  MCV 97.0 95.8 95.5  PLT 479* 495* 604*    CBG: Recent Labs  Lab 10/10/23 0550 10/10/23 1158 10/10/23 1657 10/10/23 2052 10/11/23 0614  GLUCAP 95 140* 98 136* 103*    Brief HPI:   John [name] is a 63 y.o. male with PMHx chronic  systolic CHF, ICD implant, hypertension,  hyperlipidemia, DM type [name], CAD, CVA on Eliquis .  The patient presented to [facility] on 09/27/2023 with complaints of right hip pain after suffering a ground level fall at work.  CT head was negative for acute abnormalities and hip x-ray revealed right trochanteric hip fracture.  Orthopedics was consulted and the patient underwent [place] trochanteric fixation with IM rod by Dr.Gebauer on the same day. Post op CT of right hip on 10/1 reveled small, hematomas seen in the gluteus medius and minus. Hospital course complicated by ABLA, Eliquis  has since been held. Hemoglobin stabilized hemoglobin 8.6 from yesterday 8.8. Wound VAC place to the right hip and to remain under the direction of orthopedics who continues to follow closely.    Prior to arrival independent with ADLs and active in the community driving/working.  Currently he requires mod assist to toilet and for transfers, mod assistance for functional mobility with rolling walker cueing for sequencing and is able to ambulate short distance from bed to chair. Therapy evaluations completed due to patient decreased functional mobility was admitted for a comprehensive rehab program.       Hospital Course: John [name] was admitted to rehab 10/04/2023 for inpatient therapies to consist of PT, ST and OT at least three hours five days a week. Past admission physiatrist, therapy team and rehab RN have worked together to provide customized collaborative inpatient rehab.  Functional deficits secondary to: Right intertrochanteric hip fx after fall at work. S/p right IM rod on 09/27/23.  Pain Management: Tizanidine 2 mg every 6 hours as needed, Norco and Tylenol  as needed.  Mood/Behavior/Sleep: Ambien  5 mg  Skin/Wound Care: L staples removed om 10/10/2023.  Wound care/signs and symptoms of infection discussed at discharge.    Fluid/Nutrition/Electrolytes: Intake and output were monitored along with***.   The patient was maintained on a *** diet with *** supplementation. Cardiac diet low cholesterol/fat and low sodium advised to continue. CHF instructions provided.     Hypertension: Orthostatic hypotension was monitored, and blood pressures remained controlled. Patient advised to get blood pressure cuff for monitoring at home. Patient reports access to BP cuff. Education for BP parameters discussed. The patient is scheduled to establish care/follow up with PCP upon discharge.   Hyperlipidemia:    Hx of ***:    Resolved:                                                                            Family teaching completed upon discharge to home.   Blood pressures were monitored on TID basis and   Diabetes has been monitored with ac/hs CBG checks and SSI was use prn for tighter BS control. Diabetic teaching was completed.      Rehab course: During patient's stay in rehab weekly team conferences were held to monitor patient's progress, set goals and discuss barriers to discharge. At admission, patient required assistance with ADLs and ModAssist Plus 2 for transfers.    Occupational Therapy: Patient met all LTG  Discharging at overall ***. ***with good understanding and adherence to precautions.    Physical Therapy: Patient *** LTG and has had improvement in functional use RUE/LUE  and RLE/LLE as well as improvement in awareness. Dishacarging at ambulatory  level supervision.   Speech Therapy:    He/She  has had improvement in activity tolerance, balance, postural control as well as ability to compensate for deficits. He/She has had improvement in functional use RUE/LUE  and RLE/LLE as well as improvement in awareness  Family teaching completed upon discharge to home.      Disposition:  Discharge disposition: 06-Home-Health Care Svc        Diet:   Special Instructions:  -No driving or operating heavy machinery until cleared by provider  -No smoking or alcohol or illicit  drug use     Allergies as of 10/11/2023   No Known Allergies      Medication List     PAUSE taking these medications    Entresto  24-26 MG Wait to take this until your doctor or other care provider tells you to start again. Generic drug: sacubitril-valsartan TAKE 1 TABLET BY MOUTH TWICE A DAY       STOP taking these medications    diclofenac  Sodium 1 % Gel Commonly known as: Voltaren    methocarbamol 500 MG tablet Commonly known as: ROBAXIN   morphine  (PF) 2 MG/ML injection   ondansetron  4 MG tablet Commonly known as: ZOFRAN    polyethylene glycol 17 g packet Commonly known as: MIRALAX  / GLYCOLAX  Replaced by: polyethylene glycol powder 17 GM/SCOOP powder       TAKE these medications    acetaminophen  325 MG tablet Commonly known as: TYLENOL  Take 1-2 tablets (325-650 mg total) by mouth every 4 (four) hours as needed for mild pain (pain score 1-3). What changed:  medication strength how much to take when to take this reasons to take this   apixaban  5 MG Tabs tablet Commonly known as: ELIQUIS  Take 1 tablet (5 mg total) by mouth 2 (two) times daily.   ascorbic acid 1000 MG tablet Commonly known as: VITAMIN C Take 1 tablet (1,000 mg total) by mouth daily.   bisacodyl  5 MG EC tablet Commonly known as: DULCOLAX Take 1 tablet (5 mg total) by mouth daily as needed for moderate constipation.   carvedilol  12.5 MG tablet Commonly known as: COREG  TAKE ONE TABLET BY MOUTH TWICE A DAY WITH A MEAL   chlorhexidine  4 % external liquid Commonly known as: HIBICLENS  Apply 15 mLs (1 Application total) topically as directed for 30 doses. Use as directed daily for 5 days every other week for 6 weeks.   dapagliflozin  propanediol 10 MG Tabs tablet Commonly known as: Farxiga  Take 1 tablet (10 mg total) by mouth daily.   ezetimibe  10 MG tablet Commonly known as: ZETIA  Take 1 tablet (10 mg total) by mouth daily.   feeding supplement (GLUCERNA SHAKE) Liqd Take 237 mLs  by mouth 3 (three) times daily between meals.   glipiZIDE  10 MG 24 hr tablet Commonly known as: GLUCOTROL  XL Take 1 tablet (10 mg total) by mouth daily with breakfast.   HYDROcodone -acetaminophen  5-325 MG tablet Commonly known as: NORCO/VICODIN Take 1-2 tablets by mouth every 6 (six) hours as needed for moderate pain (pain score 4-6). What changed: reasons to take this   hydrocortisone cream 1 % Apply topically 2 (two) times daily as needed for itching.   insulin  aspart 100 UNIT/ML injection Commonly known as: novoLOG  Inject 0-5 Units into the skin at bedtime.   insulin  aspart 100 UNIT/ML injection Commonly known as: novoLOG  Inject 0-9 Units into the skin 3 (three) times daily with meals.   insulin  glargine 100 UNIT/ML injection Commonly known as: LANTUS  Inject 0.2 mLs (  20 Units total) into the skin daily. What changed: Another medication with the same name was removed. Continue taking this medication, and follow the directions you see here.   magnesium hydroxide 400 MG/5ML suspension Commonly known as: MILK OF MAGNESIA Take 15 mLs by mouth daily as needed for mild constipation.   multivitamin with minerals Tabs tablet Take 1 tablet by mouth daily.   mupirocin  ointment 2 % Commonly known as: BACTROBAN  Place 1 Application into the nose 2 (two) times daily for 60 doses. Use as directed 2 times daily for 5 days every other week for 6 weeks.   polyethylene glycol powder 17 GM/SCOOP powder Commonly known as: GLYCOLAX /MIRALAX  Dissolve 1 capful (17g) in 4-8 ounces of liquid and take by mouth daily. Replaces: polyethylene glycol 17 g packet   Repatha  SureClick 140 MG/ML Soaj Generic drug: Evolocumab  INJECT 1 MILLILITER UNDER THE SKIN EVERY 14 DAYS   senna-docusate 8.6-50 MG tablet Commonly known as: Senokot-S Take 2 tablets by mouth 2 (two) times daily.   sildenafil  100 MG tablet Commonly known as: Viagra  Take 0.5-1 tablets (50-100 mg total) by mouth daily as needed for  erectile dysfunction.   spironolactone  25 MG tablet Commonly known as: ALDACTONE  TAKE 1/2 TABLET BY MOUTH DAILY   tirzepatide  12.5 MG/0.5ML Pen Commonly known as: MOUNJARO  Inject 12.5 mg into the skin once a week.   tiZANidine 2 MG tablet Commonly known as: ZANAFLEX Take 1 tablet (2 mg total) by mouth every 6 (six) hours as needed for muscle spasms.   ZZZQUIL PO Take 1 Dose by mouth at bedtime as needed (Sleep).        Follow-up Information     Berneta Elsie Sayre, MD Follow up.   Specialty: Family Medicine Why: Call for an appointment. Contact information: [address] [place] [address] [phone]         Reyne Cordella SQUIBB, MD Follow up.   Specialty: Orthopedic Surgery Why: Call for a post-operative appointment Contact information: [address] [place] [address] [phone]                 Signed: Daphne LITTIE Satterfield 10/11/2023, 9:36 AM

## 2023-10-11 ENCOUNTER — Other Ambulatory Visit (HOSPITAL_COMMUNITY): Payer: Self-pay

## 2023-10-11 DIAGNOSIS — S72001D Fracture of unspecified part of neck of right femur, subsequent encounter for closed fracture with routine healing: Secondary | ICD-10-CM

## 2023-10-11 LAB — GLUCOSE, CAPILLARY
Glucose-Capillary: 103 mg/dL — ABNORMAL HIGH (ref 70–99)
Glucose-Capillary: 150 mg/dL — ABNORMAL HIGH (ref 70–99)

## 2023-10-11 MED ORDER — GABAPENTIN 300 MG PO CAPS: 300.0000 mg | ORAL_CAPSULE | Freq: Three times a day (TID) | ORAL | Status: DC

## 2023-10-11 NOTE — Progress Notes (Signed)
 INPATIENT REHABILITATION DISCHARGE NOTE   Discharge instructions by: Daphne, NP  Verbalized understanding: yes  Skin care/Wound care healing? yes  Pain: none at this time  IV's: none  Tubes/Drains: none  O2: none  Safety instructions: none  Patient belongings: sent with pt, along with discharge meds  Discharged to: home  Discharged via: family transport  Notes: done  Geralynn LELON Earl, Charity fundraiser

## 2023-10-11 NOTE — Progress Notes (Addendum)
 Inpatient Rehabilitation Discharge Medication Review by a Pharmacist  A complete drug regimen review was completed for this patient to identify any potential clinically significant medication issues.  High Risk Drug Classes Is patient taking? Indication by Medication  Antipsychotic No   Anticoagulant Yes Eliquis  - apical thrombus  Antibiotic No   Opioid Yes Norco - moderate pain  Antiplatelet No   Hypoglycemics/insulin  Yes Insulin  aspart/glargine, glipizide  XL, dapagliflozin  (CHF), tirzepatide  - DM2  Vasoactive Medication Yes Carvedilol , Spironolactone  - CHF, HTN  Chemotherapy No   Other Yes Ascorbic acid, magnesium hydroxide, Glucerna, MVI - supplements Hydrocortisone cream - itching Tizanidine - muscle spasms Chlorhexidine , mupirocin  - skin care Ezetimibe , Repatha  - cholesterol Sildenafil  - ED PEG , bisacodyl  , Senokot-S , and - constipation Acetaminophen - pain        Type of Medication Issue Identified Description of Issue Recommendation(s)  Drug Interaction(s) (clinically significant)     Duplicate Therapy     Allergy     No Medication Administration End Date     Incorrect Dose     Additional Drug Therapy Needed     Significant med changes from prior encounter (inform family/care partners about these prior to discharge). Entresto  on HOLD until follow up with MD  Communicate relevant medication changes to patient/family members at discharge from CIR.   Restart or discontinue PTA meds not resumed in CIR at discharge if clinically indicated.   Other       Clinically significant medication issues were identified that warrant physician communication and completion of prescribed/recommended actions by midnight of the next day:  No  Name of provider notified for urgent issues identified:   Provider Method of Notification:     Pharmacist comments:   Time spent performing this drug regimen review (minutes):  20    Rocky Slade, PharmD, BCPS

## 2023-10-11 NOTE — Progress Notes (Signed)
 PROGRESS NOTE   Subjective/Complaints: Patient is medically stable for d/c today Staples were removed yesterday Discussed CBGs, that Glargine can be decreased to 19U at home  ROS: as per HPI. Denies CP, SOB, abd pain, N/V/D, or any other complaints at this time. +right hip pain  Objective:   No results found. Recent Labs    10/10/23 0543  WBC 9.6  HGB 9.9*  HCT 29.5*  PLT 604*   Recent Labs    10/10/23 0543  NA 136  K 3.9  CL 105  CO2 22  GLUCOSE 103*  BUN 21  CREATININE 1.07  CALCIUM  8.7*      Intake/Output Summary (Last 24 hours) at 10/11/2023 0942 Last data filed at 10/11/2023 0359 Gross per 24 hour  Intake 540 ml  Output 400 ml  Net 140 ml        Physical Exam: Vital Signs Blood pressure 101/67, pulse 78, temperature 98.8 F (37.1 C), temperature source Oral, resp. rate 18, height 5' 7 (1.702 m), weight 78.4 kg, SpO2 99%.  Constitutional: No distress . Vital signs reviewed. Resting comfortably in bed. BMI 26.79 HEENT: NCAT, EOMI, oral membranes moist, a little HOH Neck: supple Cardiovascular: RRR without m/r/g appreciated. No JVD    Respiratory/Chest: CTA Bilaterally without wheezes or rales. Normal effort    GI/Abdomen: soft, +BS throughout, non-tender, non-distended Ext: no clubbing, cyanosis, or edema, R hip incisions covered.  Psych: pleasant and cooperative Skin: dry, warm, wound vac removed from right hip, healing well Musculoskeletal:        General: Swelling and tenderness (right hip) present. Normal range of motion.     Cervical back: Normal range of motion.  Skin:    Comments: Distal surgical incisions clean and intact with minimal drainage, foam dressing. Proximal incision with vac in place, serosanguinous drainage present in can. Bruising present Neurological:     Mental Status: He is alert.     Comments: Alert and oriented x 3. Normal insight and awareness. Intact Memory.  Normal language and speech. Cranial nerve exam unremarkable. MMT: BUE 5/5. RLE limited by pain proximally but 4+/5 ADF/PF. LLE 4+/5 prox to 5/5 distally. Sensory exam normal for light touch and pain in all 4 limbs. No limb ataxia or cerebellar signs. No abnormal tone appreciated. Stable 10/10  Assessment/Plan: 1. Functional deficits which require 3+ hours per day of interdisciplinary therapy in a comprehensive inpatient rehab setting. Physiatrist is providing close team supervision and 24 hour management of active medical problems listed below. Physiatrist and rehab team continue to assess barriers to discharge/monitor patient progress toward functional and medical goals  Care Tool:  Bathing    Body parts bathed by patient: Right arm, Left arm, Chest, Abdomen, Front perineal area, Buttocks, Right upper leg, Left upper leg, Right lower leg, Left lower leg, Face         Bathing assist Assist Level: Independent with assistive device     Upper Body Dressing/Undressing Upper body dressing   What is the patient wearing?: Pull over shirt    Upper body assist Assist Level: Independent    Lower Body Dressing/Undressing Lower body dressing      What is the patient  wearing?: Underwear/pull up, Pants     Lower body assist Assist for lower body dressing: Independent with assitive device     Toileting Toileting Toileting Activity did not occur (Clothing management and hygiene only): N/A (no void or bm)  Toileting assist Assist for toileting: Supervision/Verbal cueing     Transfers Chair/bed transfer  Transfers assist     Chair/bed transfer assist level: Independent with assistive device Chair/bed transfer assistive device: Armrests, Geologist, engineering   Ambulation assist      Assist level: Independent with assistive device Assistive device: Walker-rolling Max distance: 100'   Walk 10 feet activity   Assist     Assist level: Independent with assistive  device Assistive device: Walker-rolling   Walk 50 feet activity   Assist Walk 50 feet with 2 turns activity did not occur: Safety/medical concerns  Assist level: Independent with assistive device Assistive device: Walker-rolling    Walk 150 feet activity   Assist Walk 150 feet activity did not occur: Safety/medical concerns (pain)         Walk 10 feet on uneven surface  activity   Assist Walk 10 feet on uneven surfaces activity did not occur: Safety/medical concerns   Assist level: Independent with assistive device Assistive device: Walker-rolling   Wheelchair     Assist Is the patient using a wheelchair?: Yes Type of Wheelchair: Manual    Wheelchair assist level: Independent Max wheelchair distance: 152ft    Wheelchair 50 feet with 2 turns activity    Assist        Assist Level: Independent   Wheelchair 150 feet activity     Assist      Assist Level: Independent   Blood pressure 101/67, pulse 78, temperature 98.8 F (37.1 C), temperature source Oral, resp. rate 18, height 5' 7 (1.702 m), weight 78.4 kg, SpO2 99%.  Medical Problem List and Plan: 1. Functional deficits secondary to right intertrochanteric hip fx after fall at work. S/p right IM rod on 09/27/23             -patient may not yet shower             -ELOS/Goals: 7 days, supervision to mod I goals with PT and OT  -Continue CIR  Wound vac removed  D/c home  2.  Antithrombotics: -DVT/anticoagulation:  Mechanical: Antiembolism stockings, thigh (TED hose) Bilateral lower extremities Sequential compression devices, below knee Bilateral lower extremities             -antiplatelet therapy: continue Eliquis    3. Pain Management: continue Tylenol , Robaxin, and Norco as needed -10/06/23 robaxin ineffective, will switch to Tizanidine 2mg  q6h prn, could increase dose if still ineffective. Monitor for effect.   4. Insomnia: continue Ambien  5 mg as needed  5. Neuropsych/cognition: This  patient is capable of making decisions on his own behalf.  6. Skin/Wound Care: Maintain proximal incisional wound VAC per Ortho until drainage ceases. WOC Rn signed off             -foam dressing to lower incision             -staples d/ced  7. Fluids/Electrolytes/Nutrition: Monitor intake and output and follow-up chemistries.             - Diet changed to carb modified-continue Glucerna             - Vitamin C and zinc  8. HLD: continue Zetia  10 mg  9. HTN: continue Coreg  12.5 mg BID,  Spironolactone  12.5mg  daily, holding Entresto , BP reviewed and is soft             - Monitor blood pressures with increased activity  -10/4-5/25 BP soft but stable, monitor closely.  Vitals:   10/07/23 1553 10/07/23 2008 10/08/23 0417 10/08/23 1250  BP: 99/75 101/64 (!) 109/52 106/62   10/08/23 2022 10/09/23 0601 10/09/23 1301 10/09/23 2028  BP: 96/72 114/74 93/67 (!) 104/42   10/10/23 0551 10/10/23 1428 10/10/23 1936 10/11/23 0359  BP: 113/87 116/65 110/64 101/67    10. T2DM: Resumed home Glipizide  10mg  daily, Farxiga  10mg  daily --Inpatient insulin  needs have been lower than at home  -CBG ACHS with SSI and bedtime coverage. Continue lantus  20U daily CBG (last 3)  Recent Labs    10/10/23 1657 10/10/23 2052 10/11/23 0614  GLUCAP 98 136* 103*    11. CKD IIIa: Baseline creatinine 1.1-1.4.  Stable at 1.1-- Farxiga    12. Chronic systolic CHF: Monitor daily weights and volume status. Holding Entresto , continue Coreg  and aldactone .    Weight reviewed and is decreased Filed Weights   10/08/23 0230 10/09/23 0151 10/10/23 0600  Weight: 77.6 kg 77.6 kg 78.4 kg    13. ABLA: Hgb reviewed and is now uptrending, received approval from ortho to restart Eliquis   14. Post-op Hematomas: wound vac d/ced  15. Slow transit constipation: LBM 10/9: d/c dulcolax, decrease miralax  to daily   >30 minutes spent in discharge of patient including review of medications and follow-up appointments, physical examination,  and in answering all patient's questions     LOS: 7 days A FACE TO FACE EVALUATION WAS PERFORMED  Sven SQUIBB Ronne Savoia 10/11/2023, 9:42 AM

## 2023-10-28 ENCOUNTER — Other Ambulatory Visit: Payer: Self-pay

## 2023-10-29 MED ORDER — CARVEDILOL 12.5 MG PO TABS: 12.5000 mg | ORAL_TABLET | Freq: Two times a day (BID) | ORAL | 0 refills | Status: AC

## 2023-11-14 ENCOUNTER — Ambulatory Visit: Admitting: Family Medicine

## 2023-11-17 ENCOUNTER — Other Ambulatory Visit: Payer: Self-pay | Admitting: Cardiology

## 2023-12-02 ENCOUNTER — Inpatient Hospital Stay: Admitting: Family Medicine

## 2023-12-02 ENCOUNTER — Ambulatory Visit: Admitting: Family Medicine

## 2023-12-03 ENCOUNTER — Ambulatory Visit: Payer: Managed Care, Other (non HMO)

## 2023-12-03 DIAGNOSIS — I255 Ischemic cardiomyopathy: Secondary | ICD-10-CM | POA: Diagnosis not present

## 2023-12-04 LAB — CUP PACEART REMOTE DEVICE CHECK
Battery Remaining Longevity: 156 mo
Battery Remaining Percentage: 100 %
Brady Statistic RV Percent Paced: 0 %
Date Time Interrogation Session: 20251202041100
HighPow Impedance: 78 Ohm
Implantable Lead Connection Status: 753985
Implantable Lead Implant Date: 20230602
Implantable Lead Location: 753860
Implantable Lead Model: 138
Implantable Lead Serial Number: 303305
Implantable Pulse Generator Implant Date: 20230602
Lead Channel Impedance Value: 557 Ohm
Lead Channel Setting Pacing Amplitude: 2.5 V
Lead Channel Setting Pacing Pulse Width: 0.4 ms
Lead Channel Setting Sensing Sensitivity: 0.5 mV
Pulse Gen Serial Number: 217470

## 2023-12-05 ENCOUNTER — Ambulatory Visit: Payer: Self-pay | Admitting: Cardiology

## 2023-12-05 ENCOUNTER — Ambulatory Visit: Admitting: Family Medicine

## 2023-12-05 ENCOUNTER — Encounter: Payer: Self-pay | Admitting: Family Medicine

## 2023-12-05 VITALS — BP 110/68 | HR 105 | Temp 98.0°F | Ht 67.0 in | Wt 159.0 lb

## 2023-12-05 DIAGNOSIS — R351 Nocturia: Secondary | ICD-10-CM | POA: Insufficient documentation

## 2023-12-05 DIAGNOSIS — M1711 Unilateral primary osteoarthritis, right knee: Secondary | ICD-10-CM | POA: Insufficient documentation

## 2023-12-05 NOTE — Progress Notes (Signed)
 Established Patient Office Visit   Subjective:  Patient ID: John [name], male    DOB: 1960-08-06  Age: 63 y.o. MRN: [id number]  Chief Complaint  Patient presents with   Follow-up    From hip surgery things are good, referral for ortho both knee    HPI Encounter Diagnoses  Name Primary?   Primary osteoarthritis of right knee Yes   Nocturia    For follow-up of above.  Status post closed fracture of right hip on 10/17/2023 with intramedullary fixation.  Continues to recover.  Currently in physical therapy.  This was a work injury.  History of severe OA of right knee.  Currently also dealing with right knee pain.  Needs referral to orthopedics.  He is experiencing nocturia x 2-3.  He drinks copious fluids throughout the evening.   Review of Systems  Constitutional: Negative.   HENT: Negative.    Eyes:  Negative for blurred vision, discharge and redness.  Respiratory: Negative.    Cardiovascular: Negative.   Gastrointestinal:  Negative for abdominal pain.  Genitourinary: Negative.   Musculoskeletal:  Positive for joint pain. Negative for myalgias.  Skin:  Negative for rash.  Neurological:  Negative for tingling, loss of consciousness and weakness.  Endo/Heme/Allergies:  Negative for polydipsia.     Current Outpatient Medications:    acetaminophen  (TYLENOL ) 325 MG tablet, Take 1-2 tablets (325-650 mg total) by mouth every 4 (four) hours as needed for mild pain (pain score 1-3)., Disp: , Rfl:    apixaban  (ELIQUIS ) 5 MG TABS tablet, Take 1 tablet (5 mg total) by mouth 2 (two) times daily., Disp: 60 tablet, Rfl: 0   ascorbic acid  (VITAMIN C ) 1000 MG tablet, Take 1 tablet (1,000 mg total) by mouth daily., Disp: 100 tablet, Rfl: 0   bisacodyl  (DULCOLAX) 5 MG EC tablet, Take 1 tablet (5 mg total) by mouth daily as needed for moderate constipation., Disp: , Rfl:    carvedilol  (COREG ) 12.5 MG tablet, Take 1 tablet (12.5 mg total) by mouth 2 (two) times daily with a meal. Please  call [phone] to schedule an overdue appointment with Dr. Redell Shallow for future refills. Thank you. 1st attempt., Disp: 60 tablet, Rfl: 0   dapagliflozin  propanediol (FARXIGA ) 10 MG TABS tablet, Take 1 tablet (10 mg total) by mouth daily., Disp: 90 tablet, Rfl: 3   diphenhydrAMINE  HCl, Sleep, (ZZZQUIL PO), Take 1 Dose by mouth at bedtime as needed (Sleep)., Disp: , Rfl:    Evolocumab  (REPATHA  SURECLICK) 140 MG/ML SOAJ, INJECT 1 MILLILITER UNDER THE SKIN EVERY 14 DAYS, Disp: 6 mL, Rfl: 3   ezetimibe  (ZETIA ) 10 MG tablet, Take 1 tablet (10 mg total) by mouth daily., Disp: 90 tablet, Rfl: 3   glipiZIDE  (GLUCOTROL  XL) 10 MG 24 hr tablet, Take 1 tablet (10 mg total) by mouth daily with breakfast., Disp: 90 tablet, Rfl: 3   HYDROcodone -acetaminophen  (NORCO/VICODIN) 5-325 MG tablet, Take 1-2 tablets by mouth every 6 (six) hours as needed for moderate pain (pain score 4-6)., Disp: 28 tablet, Rfl: 0   insulin  aspart (NOVOLOG ) 100 UNIT/ML injection, Inject 0-5 Units into the skin at bedtime., Disp: , Rfl:    insulin  aspart (NOVOLOG ) 100 UNIT/ML injection, Inject 0-9 Units into the skin 3 (three) times daily with meals., Disp: , Rfl:    insulin  glargine (LANTUS ) 100 UNIT/ML injection, Inject 0.2 mLs (20 Units total) into the skin daily., Disp: 10 mL, Rfl: 11   Multiple Vitamin (MULTIVITAMIN WITH MINERALS) TABS tablet, Take 1 tablet  by mouth daily., Disp: , Rfl:    polyethylene glycol powder (GLYCOLAX /MIRALAX ) 17 GM/SCOOP powder, Dissolve 1 capful (17g) in 4-8 ounces of liquid and take by mouth daily., Disp: 238 g, Rfl: 0   senna-docusate (SENOKOT-S) 8.6-50 MG tablet, Take 2 tablets by mouth 2 (two) times daily., Disp: , Rfl:    sildenafil  (VIAGRA ) 100 MG tablet, Take 0.5-1 tablets (50-100 mg total) by mouth daily as needed for erectile dysfunction., Disp: 5 tablet, Rfl: 2   spironolactone  (ALDACTONE ) 25 MG tablet, TAKE 1/2 TABLET BY MOUTH DAILY, Disp: 45 tablet, Rfl: 3   tirzepatide  (MOUNJARO ) 12.5  MG/0.5ML Pen, Inject 12.5 mg into the skin once a week., Disp: 6 mL, Rfl: 3   tiZANidine  (ZANAFLEX ) 2 MG tablet, Take 1 tablet (2 mg total) by mouth every 6 (six) hours as needed for muscle spasms., Disp: 30 tablet, Rfl: 0   chlorhexidine  (HIBICLENS ) 4 % external liquid, Apply 15 mLs (1 Application total) topically as directed for 30 doses. Use as directed daily for 5 days every other week for 6 weeks. (Patient not taking: Reported on 12/05/2023), Disp: 946 mL, Rfl: 1   feeding supplement, GLUCERNA SHAKE, (GLUCERNA SHAKE) LIQD, Take 237 mLs by mouth 3 (three) times daily between meals. (Patient not taking: Reported on 12/05/2023), Disp: , Rfl:    hydrocortisone  cream 1 %, Apply topically 2 (two) times daily as needed for itching. (Patient not taking: Reported on 12/05/2023), Disp: 30 g, Rfl: 0   magnesium  hydroxide (MILK OF MAGNESIA) 400 MG/5ML suspension, Take 15 mLs by mouth daily as needed for mild constipation. (Patient not taking: Reported on 12/05/2023), Disp: , Rfl:    [Paused] sacubitril-valsartan (ENTRESTO ) 24-26 MG, TAKE 1 TABLET BY MOUTH TWICE A DAY, Disp: 60 tablet, Rfl: 3   Objective:     BP 110/68   Pulse (!) 105   Temp 98 F (36.7 C) (Temporal)   Ht 5' 7 (1.702 m)   Wt 159 lb (72.1 kg)   SpO2 98%   BMI 24.90 kg/m    Physical Exam Constitutional:      General: He is not in acute distress.    Appearance: Normal appearance. He is not ill-appearing, toxic-appearing or diaphoretic.  HENT:     Head: Normocephalic and atraumatic.     Right Ear: External ear normal.     Left Ear: External ear normal.  Eyes:     General: No scleral icterus.       Right eye: No discharge.        Left eye: No discharge.     Extraocular Movements: Extraocular movements intact.     Conjunctiva/sclera: Conjunctivae normal.  Cardiovascular:     Rate and Rhythm: Normal rate and regular rhythm.  Pulmonary:     Effort: Pulmonary effort is normal. No respiratory distress.     Breath sounds: Normal  breath sounds. No wheezing or rales.  Abdominal:     General: Bowel sounds are normal.  Musculoskeletal:     Cervical back: No rigidity or tenderness.     Right hip: Decreased range of motion.     Left hip: Decreased range of motion.  Skin:    General: Skin is warm and dry.  Neurological:     Mental Status: He is alert and oriented to person, place, and time.  Psychiatric:        Mood and Affect: Mood normal.        Behavior: Behavior normal.      No results found for  any visits on 12/05/23.    The ASCVD Risk score (Arnett DK, et al., 2019) failed to calculate for the following reasons:   Risk score cannot be calculated because patient has a medical history suggesting prior/existing ASCVD    Assessment & Plan:   Primary osteoarthritis of right knee -     Ambulatory referral to Orthopedic Surgery  Nocturia    Return in about 3 months (around 03/04/2024) for annual physical.  Patient will reschedule his ophthalmology appointment.  He will also follow-up with Dr. Abran for colonoscopy.  Will see me in 3 months for physical.  Hold fluids 2 hours prior to bedtime for nocturia  Elsie Sim Lent, MD

## 2023-12-06 NOTE — Progress Notes (Signed)
 Remote ICD Transmission

## 2023-12-09 ENCOUNTER — Other Ambulatory Visit (HOSPITAL_COMMUNITY): Payer: Self-pay

## 2024-01-10 ENCOUNTER — Ambulatory Visit: Payer: Self-pay | Admitting: Internal Medicine

## 2024-01-10 ENCOUNTER — Encounter: Payer: Self-pay | Admitting: Internal Medicine

## 2024-01-10 ENCOUNTER — Other Ambulatory Visit: Payer: Self-pay

## 2024-01-10 ENCOUNTER — Ambulatory Visit: Admitting: Internal Medicine

## 2024-01-10 VITALS — BP 152/90 | Ht 67.0 in | Wt 158.0 lb

## 2024-01-10 DIAGNOSIS — E1122 Type 2 diabetes mellitus with diabetic chronic kidney disease: Secondary | ICD-10-CM

## 2024-01-10 DIAGNOSIS — E1159 Type 2 diabetes mellitus with other circulatory complications: Secondary | ICD-10-CM | POA: Diagnosis not present

## 2024-01-10 DIAGNOSIS — N1831 Chronic kidney disease, stage 3a: Secondary | ICD-10-CM

## 2024-01-10 DIAGNOSIS — E1165 Type 2 diabetes mellitus with hyperglycemia: Secondary | ICD-10-CM | POA: Diagnosis not present

## 2024-01-10 DIAGNOSIS — Z794 Long term (current) use of insulin: Secondary | ICD-10-CM

## 2024-01-10 LAB — POCT GLYCOSYLATED HEMOGLOBIN (HGB A1C): Hemoglobin A1C: 12.1 % — AB (ref 4.0–5.6)

## 2024-01-10 MED ORDER — LANTUS SOLOSTAR 100 UNIT/ML ~~LOC~~ SOPN: 40.0000 [IU] | PEN_INJECTOR | Freq: Every day | SUBCUTANEOUS | 3 refills | Status: AC

## 2024-01-10 MED ORDER — NOVOLOG FLEXPEN 100 UNIT/ML ~~LOC~~ SOPN: 6.0000 [IU] | PEN_INJECTOR | Freq: Three times a day (TID) | SUBCUTANEOUS | 6 refills | Status: AC

## 2024-01-10 MED ORDER — DEXCOM G7 SENSOR MISC: 1.0000 | 3 refills | Status: AC

## 2024-01-10 MED ORDER — DAPAGLIFLOZIN PROPANEDIOL 10 MG PO TABS: 10.0000 mg | ORAL_TABLET | Freq: Every day | ORAL | 3 refills | Status: AC

## 2024-01-10 MED ORDER — INSULIN PEN NEEDLE 32G X 4 MM MISC: 1.0000 | Freq: Four times a day (QID) | 3 refills | Status: AC

## 2024-01-10 NOTE — Progress Notes (Signed)
 "   Name: John [name]  Age/ Sex: 64 y.o., male   MRN/ DOB: [id number], Jan 04, 1960     PCP: Berneta Elsie Sayre, MD   Reason for Endocrinology Evaluation: Type 2 Diabetes Mellitus  Initial Endocrine Consultative Visit: 12/11/2017    PATIENT IDENTIFIER: [name] is a 64 y.o. male with a past medical history of T2DM, Hx CVA, CHF and CAD. The patient has followed with Endocrinology clinic since 12/11/20219 for consultative assistance with management of his diabetes.     DIABETIC HISTORY:  John [name] was diagnosed with DM 2004, and started insulin  therapy approximately 2012 . His hemoglobin A1c has ranged from 6.3% in 2022, peaking at 8.4% in 23.    He was followed up by Dr. Kassie between 2019 until March 2023  On his initial visit with me  09/2021 he had an A1c 8.3%, he was on Trulicity , Farxiga  and Lantus    Switched Trulicity  to Mounjaro   06/2022  Started glipizide  02/2023 with an A1c of 9.3%  SUBJECTIVE:   During the last visit (07/08/2023): A1c 8.1%   Today (01/10/2024): John [name] is here for follow-up on diabetes management.  He checks his blood sugars occasionally.  Patient is s/p ICD placement on 06/02/2021, he continues to follow-up with cardiology   Since his last visit here he underwent ORIF of the right hip secondary to hip fracture in 09/2023, continues with pain   The patient is on less Lantus  than previously prescribed, currently 20 units versus in the past he was on 45 units.  He was also noted with NovoLog  on his medication list this was prescribed by orthopedics   He has been out of glycemic agents for ~ 2 weeks  Forgets lantus  2-3 days a week  Continues with minimal constipation  No nausea   Eats 2 meals a day  Avoid sugar sweetened beverages   HOME DIABETES REGIMEN:  Glipizide  10 mg XL, 1 tablet daily Farxiga  10 mg daily Mounjaro  12.5 mg weekly Lantus  45 units daily - 40 units     Statin: no, patient on Repatha  ACE-I/ARB:  Yes    METER DOWNLOAD SUMMARY: Did not bring     DIABETIC COMPLICATIONS: Microvascular complications:  CKD III Denies: neuropathy, retinopathy  Last Eye Exam: Completed 08/2021  Macrovascular complications:  CVA (05/2020), CAD Denies: PVD   HISTORY:  Past Medical History:  Past Medical History:  Diagnosis Date   AICD (automatic cardioverter/defibrillator) present    Arthritis    Diabetes mellitus without complication (HCC)    Hyperlipidemia    Hypertension    Stroke (HCC) 05/2020   Past Surgical History:  Past Surgical History:  Procedure Laterality Date   ICD IMPLANT N/A 06/02/2021   Procedure: ICD IMPLANT;  Surgeon: Fernande Elspeth BROCKS, MD;  Location: [facility] INVASIVE CV LAB;  Service: Cardiovascular;  Laterality: N/A;   INTRAMEDULLARY (IM) NAIL INTERTROCHANTERIC Right 09/27/2023   Procedure: FIXATION, FRACTURE, INTERTROCHANTERIC, WITH INTRAMEDULLARY ROD;  Surgeon: Reyne Cordella SQUIBB, MD;  Location: [facility] OR;  Service: Orthopedics;  Laterality: Right;   LEFT HEART CATH AND CORONARY ANGIOGRAPHY N/A 08/18/2020   Procedure: LEFT HEART CATH AND CORONARY ANGIOGRAPHY;  Surgeon: Jordan, Peter M, MD;  Location: [facility] INVASIVE CV LAB;  Service: Cardiovascular;  Laterality: N/A;   TONSILLECTOMY     Social History:  reports that he has quit smoking. He has never used smokeless tobacco. He reports current alcohol use. He reports that he does not use drugs. Family History:  Family History  Problem Relation Age of Onset   Diabetes Mother    CAD Mother    Heart attack Mother        x 2   COPD Father    Colon cancer Neg Hx    Esophageal cancer Neg Hx    Rectal cancer Neg Hx    Stomach cancer Neg Hx      HOME MEDICATIONS: Allergies as of 01/10/2024   No Known Allergies      Medication List        Accurate as of January 10, 2024  7:04 AM. If you have any questions, ask your nurse or doctor.          PAUSE taking these medications    Entresto  24-26 MG Wait to take this until your  doctor or other care provider tells you to start again. Generic drug: sacubitril-valsartan TAKE 1 TABLET BY MOUTH TWICE A DAY       TAKE these medications    acetaminophen  325 MG tablet Commonly known as: TYLENOL  Take 1-2 tablets (325-650 mg total) by mouth every 4 (four) hours as needed for mild pain (pain score 1-3).   apixaban  5 MG Tabs tablet Commonly known as: ELIQUIS  Take 1 tablet (5 mg total) by mouth 2 (two) times daily.   ascorbic acid  1000 MG tablet Commonly known as: VITAMIN C  Take 1 tablet (1,000 mg total) by mouth daily.   bisacodyl  5 MG EC tablet Commonly known as: DULCOLAX Take 1 tablet (5 mg total) by mouth daily as needed for moderate constipation.   carvedilol  12.5 MG tablet Commonly known as: COREG  Take 1 tablet (12.5 mg total) by mouth 2 (two) times daily with a meal. Please call [phone] to schedule an overdue appointment with Dr. Redell Shallow for future refills. Thank you. 1st attempt.   chlorhexidine  4 % external liquid Commonly known as: HIBICLENS  Apply 15 mLs (1 Application total) topically as directed for 30 doses. Use as directed daily for 5 days every other week for 6 weeks.   dapagliflozin  propanediol 10 MG Tabs tablet Commonly known as: Farxiga  Take 1 tablet (10 mg total) by mouth daily.   ezetimibe  10 MG tablet Commonly known as: ZETIA  Take 1 tablet (10 mg total) by mouth daily.   feeding supplement (GLUCERNA SHAKE) Liqd Take 237 mLs by mouth 3 (three) times daily between meals.   glipiZIDE  10 MG 24 hr tablet Commonly known as: GLUCOTROL  XL Take 1 tablet (10 mg total) by mouth daily with breakfast.   HYDROcodone -acetaminophen  5-325 MG tablet Commonly known as: NORCO/VICODIN Take 1-2 tablets by mouth every 6 (six) hours as needed for moderate pain (pain score 4-6).   hydrocortisone  cream 1 % Apply topically 2 (two) times daily as needed for itching.   insulin  aspart 100 UNIT/ML injection Commonly known as: novoLOG  Inject 0-5  Units into the skin at bedtime.   insulin  aspart 100 UNIT/ML injection Commonly known as: novoLOG  Inject 0-9 Units into the skin 3 (three) times daily with meals.   insulin  glargine 100 UNIT/ML injection Commonly known as: LANTUS  Inject 0.2 mLs (20 Units total) into the skin daily.   magnesium  hydroxide 400 MG/5ML suspension Commonly known as: MILK OF MAGNESIA Take 15 mLs by mouth daily as needed for mild constipation.   multivitamin with minerals Tabs tablet Take 1 tablet by mouth daily.   polyethylene glycol powder 17 GM/SCOOP powder Commonly known as: GLYCOLAX /MIRALAX  Dissolve 1 capful (17g) in 4-8 ounces of liquid and take by mouth daily.  Repatha  SureClick 140 MG/ML Soaj Generic drug: Evolocumab  INJECT 1 MILLILITER UNDER THE SKIN EVERY 14 DAYS   senna-docusate 8.6-50 MG tablet Commonly known as: Senokot-S Take 2 tablets by mouth 2 (two) times daily.   sildenafil  100 MG tablet Commonly known as: Viagra  Take 0.5-1 tablets (50-100 mg total) by mouth daily as needed for erectile dysfunction.   spironolactone  25 MG tablet Commonly known as: ALDACTONE  TAKE 1/2 TABLET BY MOUTH DAILY   tirzepatide  12.5 MG/0.5ML Pen Commonly known as: MOUNJARO  Inject 12.5 mg into the skin once a week.   tiZANidine  2 MG tablet Commonly known as: ZANAFLEX  Take 1 tablet (2 mg total) by mouth every 6 (six) hours as needed for muscle spasms.   ZZZQUIL PO Take 1 Dose by mouth at bedtime as needed (Sleep).         OBJECTIVE:   Vital Signs: There were no vitals taken for this visit.  Wt Readings from Last 3 Encounters:  12/05/23 159 lb (72.1 kg)  10/10/23 172 lb 13.5 oz (78.4 kg)  09/27/23 165 lb (74.8 kg)     Exam: General: Pt appears well and is in NAD  Lungs: Clear with good BS bilat   Heart: RRR   Neuro: MS is good with appropriate affect, pt is alert and Ox3    DM foot exam: 07/08/2023  The skin of the feet is  without sores or ulcerations, dystrophic nails  The pedal  pulses are 1+ on right and 1+ on left. The sensation is intact to a screening 5.07, 10 gram monofilament bilaterally          DATA REVIEWED:  Lab Results  Component Value Date   HGBA1C 8.1 (A) 07/08/2023   HGBA1C 9.3 (A) 02/28/2023   HGBA1C 10.3 (A) 11/28/2022    Latest Reference Range & Units 05/13/23 10:40  Comprehensive metabolic panel with GFR  Rpt !  Sodium 135 - 145 mEq/L 138  Potassium 3.5 - 5.1 mEq/L 4.5  Chloride 96 - 112 mEq/L 105  CO2 19 - 32 mEq/L 24  Glucose 70 - 99 mg/dL 817 (H)  BUN 6 - 23 mg/dL 27 (H)  Creatinine 9.59 - 1.50 mg/dL 8.67  Calcium  8.4 - 10.5 mg/dL 9.2  Alkaline Phosphatase 39 - 117 U/L 63  Albumin 3.5 - 5.2 g/dL 4.4  AST 0 - 37 U/L 17  ALT 0 - 53 U/L 18  Total Protein 6.0 - 8.3 g/dL 7.1  Total Bilirubin 0.2 - 1.2 mg/dL 0.5  GFR >39.99 mL/min 58.03 (L)    Latest Reference Range & Units 05/13/23 10:40  Total CHOL/HDL Ratio  2  Cholesterol 0 - 200 mg/dL 93  HDL Cholesterol >60.99 mg/dL 46.99  LDL (calc) 0 - 99 mg/dL 29  NonHDL  60.04  Triglycerides 0.0 - 149.0 mg/dL 43.9  VLDL 0.0 - 59.9 mg/dL 88.7    ASSESSMENT / PLAN / RECOMMENDATIONS:   1) Type 2 Diabetes Mellitus, Poorly controlled, With macrovascular complications - Most recent A1c of 12.1%. Goal A1c < 7.0 %.    -Patient has been noting worsening glycemic control, his A1c has increased from 8.1% to 12.1% -I suspect this is due to dietary indiscretions as well as medication nonadherence -I have recommended discontinuing glipizide  and starting him on prandial dose of insulin , discussed the importance of taking this before each meal -Dexcom was applied in the office today, Dexcom app was downloaded by my assistant, and a prescription for Dexcom has been sent to the pharmacy - I will increase  Mounjaro  as below as well - No correction scale with provide at this time as he tends to forget Lantus  and I am concerned that adding a correction scale may cause hardship on the patient.  We will  consider adding this in the future  MEDICATIONS: Stop glipizide  10 mg XL, 1 tablet daily Continue Farxiga  10 mg daily Increase Mounjaro  to 15 mg weekly Continue Lantus  40 units daily  Start NovoLog  6 units with each meal  EDUCATION / INSTRUCTIONS: BG monitoring instructions: Patient is instructed to check his blood sugars 1 times a day, fasting . Call [place] [facility] if: BG persistently < 70  I reviewed the Rule of 15 for the treatment of hypoglycemia in detail with the patient. Literature supplied.    2) Diabetic complications:  Eye: Does not have known diabetic retinopathy.  Neuro/ Feet: Does not have known diabetic peripheral neuropathy .  Renal: Patient does not have known baseline CKD. He   is  on an ACEI/ARB at present.    Follow-up in 3 months  Signed electronically by: Stefano Redgie Butts, MD  [facility] Endocrinology  [facility] [address] [place] [place], [place] [address] Phone: [phone] FAX: [phone]   CC: Berneta Elsie Sayre, MD [address] [place] [address] Phone: [phone]  Fax: [phone]  Return to Endocrinology clinic as below: Future Appointments  Date Time Provider Department Center  01/10/2024  8:50 AM Pattye Meda, Donell Redgie, MD [facility] None  03/03/2024  7:15 AM [facility] DEVICE REMOTES [facility]  03/05/2024  7:20 AM Berneta Elsie Sayre, MD [facility] [place] [facility]  06/02/2024  7:15 AM [facility] DEVICE REMOTES [facility]  09/01/2024  7:15 AM [facility] DEVICE REMOTES [facility]    "

## 2024-01-10 NOTE — Patient Instructions (Signed)
 Stop glipizide  Continue Farxiga  10 mg , 1 tablet daily Increase Mounjaro  15 mg once a WEEK Continue Lantus   40 units once  daily  Start NovoLog  6 units with each meal (breakfast, lunch, and dinner)     HOW TO TREAT LOW BLOOD SUGARS (Blood sugar LESS THAN 70 MG/DL) Please follow the RULE OF 15 for the treatment of hypoglycemia treatment (when your (blood sugars are less than 70 mg/dL)   STEP 1: Take 15 grams of carbohydrates when your blood sugar is low, which includes:  3-4 GLUCOSE TABS  OR 3-4 OZ OF JUICE OR REGULAR SODA OR ONE TUBE OF GLUCOSE GEL    STEP 2: RECHECK blood sugar in 15 MINUTES STEP 3: If your blood sugar is still low at the 15 minute recheck --> then, go back to STEP 1 and treat AGAIN with another 15 grams of carbohydrates.

## 2024-01-11 LAB — MICROALBUMIN / CREATININE URINE RATIO
Creatinine, Urine: 56 mg/dL (ref 20–320)
Microalb Creat Ratio: 39 mg/g{creat} — ABNORMAL HIGH
Microalb, Ur: 2.2 mg/dL

## 2024-01-14 ENCOUNTER — Ambulatory Visit: Payer: Self-pay | Admitting: Internal Medicine

## 2024-02-06 ENCOUNTER — Telehealth: Payer: Self-pay | Admitting: Pharmacy Technician

## 2024-02-06 ENCOUNTER — Other Ambulatory Visit (HOSPITAL_COMMUNITY): Payer: Self-pay

## 2024-02-06 NOTE — Telephone Encounter (Signed)
 Pharmacy Patient Advocate Encounter  Received notification from [facility] [employer] that Prior Authorization for Dexcom G7 Sensor  has been APPROVED from 02/06/24 to 02/05/25. Ran test claim, Copay is $45.00. This test claim was processed through [facility]- copay amounts may vary at other pharmacies due to pharmacy/plan contracts, or as the patient moves through the different stages of their insurance plan.   PA #/Case ID/Reference #: [id number]

## 2024-02-06 NOTE — Telephone Encounter (Signed)
 Pharmacy Patient Advocate Encounter   Received notification from [facility] [employer] that prior authorization for Farxiga  10 mg is required/requested.   Insurance verification completed.   The patient is insured through [facility] [employer].   Per test claim: PA required; PA submitted to above mentioned insurance via Fax Key/confirmation #/EOC fax [phone] Status is pending

## 2024-02-06 NOTE — Telephone Encounter (Signed)
 Pharmacy Patient Advocate Encounter   Received notification from [facility] [employer] that prior authorization for Dexcom G7 Sensor  is required/requested.   Insurance verification completed.   The patient is insured through [facility] [employer].   Per test claim: PA required; PA submitted to above mentioned insurance via Latent Key/confirmation #/EOC [id number] Status is pending

## 2024-02-06 NOTE — Telephone Encounter (Signed)
 Pharmacy Patient Advocate Encounter   Received notification from [facility] that prior authorization for NovoLOG  FlexPen 100UNIT/ML pen-injectors  is required/requested.   Insurance verification completed.   The patient is insured through [facility] [employer].   Per test claim: PA required; PA submitted to above mentioned insurance via Latent Key/confirmation #/EOC [id number] Status is pending

## 2024-03-05 ENCOUNTER — Encounter: Admitting: Family Medicine

## 2024-04-22 ENCOUNTER — Ambulatory Visit: Payer: Self-pay | Admitting: Internal Medicine
